# Patient Record
Sex: Male | Born: 1965
Health system: Southern US, Community
[De-identification: ages and names within clinical notes are randomized; demographics above are authoritative.]

## PROBLEM LIST (undated history)

## (undated) DIAGNOSIS — F4310 Post-traumatic stress disorder, unspecified: Secondary | ICD-10-CM

## (undated) DIAGNOSIS — F329 Major depressive disorder, single episode, unspecified: Secondary | ICD-10-CM

## (undated) DIAGNOSIS — K760 Fatty (change of) liver, not elsewhere classified: Secondary | ICD-10-CM

## (undated) DIAGNOSIS — G4733 Obstructive sleep apnea (adult) (pediatric): Secondary | ICD-10-CM

## (undated) DIAGNOSIS — F909 Attention-deficit hyperactivity disorder, unspecified type: Secondary | ICD-10-CM

## (undated) DIAGNOSIS — M1990 Unspecified osteoarthritis, unspecified site: Secondary | ICD-10-CM

## (undated) DIAGNOSIS — K219 Gastro-esophageal reflux disease without esophagitis: Secondary | ICD-10-CM

## (undated) DIAGNOSIS — Z860100 Personal history of colon polyps, unspecified: Secondary | ICD-10-CM

## (undated) DIAGNOSIS — T7840XA Allergy, unspecified, initial encounter: Secondary | ICD-10-CM

## (undated) DIAGNOSIS — F32A Depression, unspecified: Secondary | ICD-10-CM

## (undated) DIAGNOSIS — E785 Hyperlipidemia, unspecified: Secondary | ICD-10-CM

## (undated) DIAGNOSIS — I10 Essential (primary) hypertension: Secondary | ICD-10-CM

## (undated) DIAGNOSIS — A6000 Herpesviral infection of urogenital system, unspecified: Secondary | ICD-10-CM

## (undated) DIAGNOSIS — G4730 Sleep apnea, unspecified: Secondary | ICD-10-CM

## (undated) DIAGNOSIS — I861 Scrotal varices: Secondary | ICD-10-CM

## (undated) DIAGNOSIS — M722 Plantar fascial fibromatosis: Secondary | ICD-10-CM

## (undated) HISTORY — DX: Herpesviral infection of urogenital system, unspecified: A60.00

## (undated) HISTORY — DX: Fatty (change of) liver, not elsewhere classified: K76.0

## (undated) HISTORY — DX: Post-traumatic stress disorder, unspecified: F43.10

## (undated) HISTORY — DX: Attention-deficit hyperactivity disorder, unspecified type: F90.9

## (undated) HISTORY — DX: Allergy, unspecified, initial encounter: T78.40XA

## (undated) HISTORY — PX: PLANTAR FASCIA RELEASE: SHX2239

## (undated) HISTORY — PX: VARICOCELE EXCISION: SUR582

## (undated) HISTORY — DX: Unspecified osteoarthritis, unspecified site: M19.90

## (undated) HISTORY — DX: Major depressive disorder, single episode, unspecified: F32.9

## (undated) HISTORY — DX: Hyperlipidemia, unspecified: E78.5

## (undated) HISTORY — PX: FOOT SURGERY: SHX648

## (undated) HISTORY — DX: Sleep apnea, unspecified: G47.30

## (undated) HISTORY — DX: Gastro-esophageal reflux disease without esophagitis: K21.9

## (undated) HISTORY — DX: Obstructive sleep apnea (adult) (pediatric): G47.33

## (undated) HISTORY — DX: Scrotal varices: I86.1

## (undated) HISTORY — DX: Personal history of colonic polyps: Z86.010

## (undated) HISTORY — DX: Personal history of colon polyps, unspecified: Z86.0100

## (undated) HISTORY — DX: Essential (primary) hypertension: I10

## (undated) HISTORY — PX: ANKLE SURGERY: SHX546

## (undated) HISTORY — DX: Depression, unspecified: F32.A

## (undated) HISTORY — PX: SHOULDER SURGERY: SHX246

---

## 2008-01-28 ENCOUNTER — Emergency Department (HOSPITAL_BASED_OUTPATIENT_CLINIC_OR_DEPARTMENT_OTHER): Admission: EM | Admit: 2008-01-28 | Discharge: 2008-01-28 | Payer: Self-pay | Admitting: Emergency Medicine

## 2008-04-14 HISTORY — PX: NM MYOVIEW LTD: HXRAD82

## 2008-04-14 HISTORY — PX: DOPPLER ECHOCARDIOGRAPHY: SHX263

## 2008-04-20 ENCOUNTER — Emergency Department (HOSPITAL_BASED_OUTPATIENT_CLINIC_OR_DEPARTMENT_OTHER): Admission: EM | Admit: 2008-04-20 | Discharge: 2008-04-20 | Payer: Self-pay | Admitting: Emergency Medicine

## 2008-06-18 ENCOUNTER — Emergency Department (HOSPITAL_BASED_OUTPATIENT_CLINIC_OR_DEPARTMENT_OTHER): Admission: EM | Admit: 2008-06-18 | Discharge: 2008-06-18 | Payer: Self-pay | Admitting: Emergency Medicine

## 2008-06-18 ENCOUNTER — Emergency Department (HOSPITAL_BASED_OUTPATIENT_CLINIC_OR_DEPARTMENT_OTHER): Admission: EM | Admit: 2008-06-18 | Discharge: 2008-06-19 | Payer: Self-pay | Admitting: Emergency Medicine

## 2008-06-29 ENCOUNTER — Emergency Department (HOSPITAL_BASED_OUTPATIENT_CLINIC_OR_DEPARTMENT_OTHER): Admission: EM | Admit: 2008-06-29 | Discharge: 2008-06-29 | Payer: Self-pay | Admitting: Emergency Medicine

## 2008-08-04 HISTORY — PX: KNEE SURGERY: SHX244

## 2008-11-06 ENCOUNTER — Ambulatory Visit: Payer: Self-pay | Admitting: Radiology

## 2008-11-06 ENCOUNTER — Emergency Department (HOSPITAL_BASED_OUTPATIENT_CLINIC_OR_DEPARTMENT_OTHER): Admission: EM | Admit: 2008-11-06 | Discharge: 2008-11-06 | Payer: Self-pay | Admitting: Emergency Medicine

## 2009-01-21 ENCOUNTER — Emergency Department (HOSPITAL_BASED_OUTPATIENT_CLINIC_OR_DEPARTMENT_OTHER): Admission: EM | Admit: 2009-01-21 | Discharge: 2009-01-22 | Payer: Self-pay | Admitting: Emergency Medicine

## 2009-07-08 ENCOUNTER — Emergency Department (HOSPITAL_BASED_OUTPATIENT_CLINIC_OR_DEPARTMENT_OTHER): Admission: EM | Admit: 2009-07-08 | Discharge: 2009-07-08 | Payer: Self-pay | Admitting: Emergency Medicine

## 2009-07-31 ENCOUNTER — Emergency Department (HOSPITAL_BASED_OUTPATIENT_CLINIC_OR_DEPARTMENT_OTHER): Admission: EM | Admit: 2009-07-31 | Discharge: 2009-07-31 | Payer: Self-pay | Admitting: Emergency Medicine

## 2009-09-02 ENCOUNTER — Emergency Department (HOSPITAL_BASED_OUTPATIENT_CLINIC_OR_DEPARTMENT_OTHER): Admission: EM | Admit: 2009-09-02 | Discharge: 2009-09-02 | Payer: Self-pay | Admitting: Emergency Medicine

## 2009-10-15 ENCOUNTER — Emergency Department (HOSPITAL_BASED_OUTPATIENT_CLINIC_OR_DEPARTMENT_OTHER): Admission: EM | Admit: 2009-10-15 | Discharge: 2009-10-15 | Payer: Self-pay | Admitting: Emergency Medicine

## 2009-10-17 ENCOUNTER — Emergency Department (HOSPITAL_BASED_OUTPATIENT_CLINIC_OR_DEPARTMENT_OTHER): Admission: EM | Admit: 2009-10-17 | Discharge: 2009-10-17 | Payer: Self-pay | Admitting: Emergency Medicine

## 2010-02-26 ENCOUNTER — Emergency Department (HOSPITAL_BASED_OUTPATIENT_CLINIC_OR_DEPARTMENT_OTHER): Admission: EM | Admit: 2010-02-26 | Discharge: 2010-02-26 | Payer: Self-pay | Admitting: Emergency Medicine

## 2010-04-08 ENCOUNTER — Emergency Department (HOSPITAL_BASED_OUTPATIENT_CLINIC_OR_DEPARTMENT_OTHER)
Admission: EM | Admit: 2010-04-08 | Discharge: 2010-04-08 | Payer: Self-pay | Source: Home / Self Care | Admitting: Emergency Medicine

## 2010-06-24 LAB — GLUCOSE, CAPILLARY

## 2010-06-25 LAB — GLUCOSE, CAPILLARY: Glucose-Capillary: 122 mg/dL — ABNORMAL HIGH (ref 70–99)

## 2010-06-25 LAB — BASIC METABOLIC PANEL
Creatinine, Ser: 0.9 mg/dL (ref 0.4–1.5)
GFR calc Af Amer: >60 mL/min (ref >60)
Potassium: 3.7 mEq/L (ref 3.5–5.1)
Sodium: 144 mEq/L (ref 135–145)

## 2010-06-30 LAB — GLUCOSE, CAPILLARY: Glucose-Capillary: 127 mg/dL — ABNORMAL HIGH (ref 70–99)

## 2010-07-18 LAB — URINALYSIS, ROUTINE W REFLEX MICROSCOPIC
Glucose, UA: NEGATIVE mg/dL
Hgb urine dipstick: NEGATIVE
Protein, ur: NEGATIVE mg/dL
Specific Gravity, Urine: 1.005 (ref 1.005–1.030)
Urobilinogen, UA: 0.2 mg/dL (ref 0.0–1.0)
pH: 7.5 (ref 5.0–8.0)

## 2010-07-18 LAB — COMPREHENSIVE METABOLIC PANEL
Albumin: 4.8 g/dL (ref 3.5–5.2)
Calcium: 9.6 mg/dL (ref 8.4–10.5)
Chloride: 102 mEq/L (ref 96–112)
Creatinine, Ser: 1.1 mg/dL (ref 0.4–1.5)
GFR calc non Af Amer: >60 mL/min (ref >60)
Glucose, Bld: 133 mg/dL — ABNORMAL HIGH (ref 70–99)
Potassium: 3.6 mEq/L (ref 3.5–5.1)
Sodium: 142 mEq/L (ref 135–145)
Total Protein: 8.2 g/dL (ref 6.0–8.3)

## 2010-07-18 LAB — POCT CARDIAC MARKERS
Myoglobin, poc: 104 ng/mL (ref 12–200)
Troponin i, poc: <0.05 ng/mL (ref 0.00–0.09)

## 2010-07-18 LAB — CBC: MCV: 89.3 fL (ref 78.0–100.0)

## 2010-07-18 LAB — DIFFERENTIAL
Basophils Absolute: 0.2 10*3/uL — ABNORMAL HIGH (ref 0.0–0.1)
Basophils Relative: 3 % — ABNORMAL HIGH (ref 0–1)
Monocytes Absolute: 0.5 10*3/uL (ref 0.1–1.0)

## 2010-07-18 LAB — GLUCOSE, CAPILLARY: Glucose-Capillary: 141 mg/dL — ABNORMAL HIGH (ref 70–99)

## 2010-07-25 LAB — CBC
HCT: 48.3 % (ref 39.0–52.0)
MCV: 89.1 fL (ref 78.0–100.0)
RDW: 12.5 % (ref 11.5–15.5)

## 2010-07-25 LAB — DIFFERENTIAL
Basophils Absolute: 0.1 10*3/uL (ref 0.0–0.1)
Basophils Relative: 2 % — ABNORMAL HIGH (ref 0–1)
Eosinophils Absolute: 0 10*3/uL (ref 0.0–0.7)
Eosinophils Relative: 0 % (ref 0–5)
Lymphs Abs: 0.7 10*3/uL (ref 0.7–4.0)
Monocytes Absolute: 0.4 10*3/uL (ref 0.1–1.0)
Neutro Abs: 5.8 10*3/uL (ref 1.7–7.7)

## 2010-07-25 LAB — BASIC METABOLIC PANEL
Chloride: 104 mEq/L (ref 96–112)
Creatinine, Ser: 1 mg/dL (ref 0.4–1.5)
GFR calc non Af Amer: >60 mL/min (ref >60)
Glucose, Bld: 105 mg/dL — ABNORMAL HIGH (ref 70–99)
Potassium: 4.1 mEq/L (ref 3.5–5.1)
Sodium: 143 mEq/L (ref 135–145)

## 2010-07-29 LAB — GLUCOSE, CAPILLARY: Glucose-Capillary: 131 mg/dL — ABNORMAL HIGH (ref 70–99)

## 2010-08-29 ENCOUNTER — Emergency Department (HOSPITAL_BASED_OUTPATIENT_CLINIC_OR_DEPARTMENT_OTHER)
Admission: EM | Admit: 2010-08-29 | Discharge: 2010-08-29 | Disposition: A | Discharge status: Home / Self Care | Payer: Self-pay | Attending: Emergency Medicine | Admitting: Emergency Medicine

## 2010-08-29 DIAGNOSIS — G8929 Other chronic pain: Secondary | ICD-10-CM | POA: Insufficient documentation

## 2010-08-29 DIAGNOSIS — M545 Low back pain, unspecified: Secondary | ICD-10-CM | POA: Insufficient documentation

## 2010-08-29 DIAGNOSIS — E119 Type 2 diabetes mellitus without complications: Secondary | ICD-10-CM | POA: Insufficient documentation

## 2010-08-29 DIAGNOSIS — Z79899 Other long term (current) drug therapy: Secondary | ICD-10-CM | POA: Insufficient documentation

## 2010-08-29 DIAGNOSIS — G4730 Sleep apnea, unspecified: Secondary | ICD-10-CM | POA: Insufficient documentation

## 2010-08-29 DIAGNOSIS — E785 Hyperlipidemia, unspecified: Secondary | ICD-10-CM | POA: Insufficient documentation

## 2010-08-29 DIAGNOSIS — I10 Essential (primary) hypertension: Secondary | ICD-10-CM | POA: Insufficient documentation

## 2010-08-29 DIAGNOSIS — M5430 Sciatica, unspecified side: Secondary | ICD-10-CM | POA: Insufficient documentation

## 2011-01-17 ENCOUNTER — Encounter: Payer: Self-pay | Admitting: Family Medicine

## 2011-01-17 ENCOUNTER — Ambulatory Visit (HOSPITAL_BASED_OUTPATIENT_CLINIC_OR_DEPARTMENT_OTHER)
Admission: RE | Admit: 2011-01-17 | Discharge: 2011-01-17 | Disposition: A | Discharge status: Home / Self Care | Source: Ambulatory Visit | Attending: Family Medicine | Admitting: Family Medicine

## 2011-01-17 ENCOUNTER — Ambulatory Visit (INDEPENDENT_AMBULATORY_CARE_PROVIDER_SITE_OTHER): Admitting: Family Medicine

## 2011-01-17 VITALS — BP 166/92 | HR 89 | Temp 97.8°F | Ht 73.0 in | Wt 315.0 lb

## 2011-01-17 DIAGNOSIS — M25569 Pain in unspecified knee: Secondary | ICD-10-CM

## 2011-01-17 DIAGNOSIS — IMO0002 Reserved for concepts with insufficient information to code with codable children: Secondary | ICD-10-CM

## 2011-01-17 DIAGNOSIS — M25561 Pain in right knee: Secondary | ICD-10-CM

## 2011-01-17 DIAGNOSIS — M1710 Unilateral primary osteoarthritis, unspecified knee: Secondary | ICD-10-CM | POA: Insufficient documentation

## 2011-01-17 NOTE — Patient Instructions (Signed)
You flared up the arthritis in your right knee. Stop trying to kick punts. Take tylenol 500mg  1-2 tabs three times a day for pain. Aleve 1-2 tabs twice a day with food if you don't have stomach or kidney problems. Glucosamine sulfate 750mg  twice a day is a supplement that has been shown to help moderate to severe arthritis. Capsaicin topically up to four times a day may also help with pain. Cortisone injections are an option. If cortisone injections do not help, there are different types of shots that may help but they take longer to take effect. It's important that you continue to stay active. If you are overweight, try to lose weight through diet and exercise. Heat or ice 15 minutes at a time 3-4 times a day as needed to help with pain. Water aerobics and cycling with low resistance are the best two types of exercise for arthritis. A knee brace may be helpful if your knee feels unstable due to pain.

## 2011-01-17 NOTE — Progress Notes (Signed)
  Subjective:    Patient ID: Charles Nash, male    DOB: 06-24-1965, 45 y.o.   MRN: [id number]  PCP: Dr. Greggory Stallion  HPI 45 yo M here for right knee pain.  Patient with history of two knee arthroscopies for meniscal debridements. 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 Some swelling. Has grinding and catching anterior and medial knee. Has iced knee but not taking any pain medicine. Has h/o cortisone injections in past but none in past year or so.  Past Medical History  Diagnosis Date  . Hyperlipidemia   . Diabetes mellitus   . Hypertension   . OSA (obstructive sleep apnea)     No current outpatient prescriptions on file prior to visit.    Past Surgical History  Procedure Date  . Knee surgery   . Ankle surgery   . Foot surgery   . Shoulder surgery     Allergies  Allergen Reactions  . Tylox     History   Social History  . Marital Status: Married    Spouse Name: N/A    Number of Children: N/A  . Years of Education: N/A   Occupational History  . Not on file.   Social History Main Topics  . Smoking status: Never Smoker   . Smokeless tobacco: Not on file  . Alcohol Use: Not on file  . Drug Use: Not on file  . Sexually Active: Not on file   Other Topics Concern  . Not on file   Social History Narrative  . No narrative on file    Family History  Problem Relation Age of Onset  . Diabetes Mother   . Hypertension Mother   . Hypertension Father   . Hyperlipidemia Father   . Hyperlipidemia Maternal Grandmother   . Hypertension Maternal Grandmother   . Hyperlipidemia Maternal Grandfather   . Hypertension Maternal Grandfather   . Hyperlipidemia Paternal Grandmother   . Hypertension Paternal Grandmother   . Hyperlipidemia Paternal Grandfather   . Hypertension Paternal Grandfather   .  Heart attack Paternal Grandfather   . Sudden death Neg Hx     BP 166/92  Pulse 89  Temp(Src) 97.8 F (36.6 C) (Oral)  Ht 6\' 1"  (1.854 m)  Wt 315 lb (142.883 kg)  BMI 41.56 kg/m2  Review of Systems See HPI above.    Objective:   Physical Exam Gen: NAD  R knee: 1+ synovitis - healed arthroscopy scars anterior knee.  No other deformity, ecchymoses, swelling.  2+ Crepitation. Mod medial and posterior patellar facet TTP. ROM 0-110 degrees. Negative ant/post drawers. Negative valgus/varus testing. Negative lachmanns. Negative mcmurrays, apleys, patellar apprehension.  + clarkes. NV intact distally.    Assessment & Plan:  1. Right knee pain - 2/2 flare of DJD.  Discussed tylenol, icing, nsaids, glucosamine.  Went ahead with intraarticular cortisone injection today.  See instructions for further.  After informed written consent, patient was seated on exam table. Right knee was prepped with alcohol swab and utilizing anterolateral approach, patient's right knee was injected intraarticularly with 3:1 marcaine: depomedrol. Patient tolerated the procedure well without immediate complications.

## 2011-01-17 NOTE — Assessment & Plan Note (Signed)
2/2 flare of DJD.  Discussed tylenol, icing, nsaids, glucosamine.  Went ahead with intraarticular cortisone injection today.  See instructions for further.  After informed written consent, patient was seated on exam table. Right knee was prepped with alcohol swab and utilizing anterolateral approach, patient's right knee was injected intraarticularly with 3:1 marcaine: depomedrol. Patient tolerated the procedure well without immediate complications.

## 2011-01-31 ENCOUNTER — Ambulatory Visit (INDEPENDENT_AMBULATORY_CARE_PROVIDER_SITE_OTHER): Admitting: Family Medicine

## 2011-01-31 ENCOUNTER — Encounter: Payer: Self-pay | Admitting: Family Medicine

## 2011-01-31 VITALS — Ht 73.0 in | Wt 315.0 lb

## 2011-01-31 DIAGNOSIS — M25561 Pain in right knee: Secondary | ICD-10-CM

## 2011-01-31 DIAGNOSIS — M25569 Pain in unspecified knee: Secondary | ICD-10-CM

## 2011-01-31 NOTE — Assessment & Plan Note (Signed)
2/2 moderate DJD of right knee.  Start synvisc 3 shot series today.  Discussed tylenol, icing, nsaids, glucosamine prior visit.  Follow up in 1 week for second injection.  After informed written consent, patient was seated on exam table. Right knee was prepped with alcohol swab and utilizing anterolateral approach, patient's right knee was injected intraarticularly with 3 mL marcaine followed by 2mL synvisc. Patient tolerated the procedure well without immediate complications.

## 2011-01-31 NOTE — Progress Notes (Signed)
Subjective:    Patient ID: Charles Nash, male    DOB: 27-Jun-1965, 45 y.o.   MRN: [id number]  PCP: Dr. Greggory Stallion  Knee Pain    45 yo M here for f/u right knee pain, DJD.  10/5: Patient with history of two knee arthroscopies for meniscal debridements. 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 Some swelling. Has grinding and catching anterior and medial knee. Has iced knee but not taking any pain medicine. Has h/o cortisone injections in past but none in past year or so.  10/19: Patient did not have pain improvement with cortisone injection. Was examined in training room at school and he would like to move forward with synvisc injections for his DJD of right knee. No change in symptoms from last visit.  Past Medical History  Diagnosis Date  . Hyperlipidemia   . Diabetes mellitus   . Hypertension   . OSA (obstructive sleep apnea)     Current Outpatient Prescriptions on File Prior to Visit  Medication Sig Dispense Refill  . amLODipine (NORVASC) 10 MG tablet Take 10 mg by mouth daily.        Marland Kitchen aspirin 81 MG tablet Take 81 mg by mouth daily.        . hydrochlorothiazide (HYDRODIURIL) 25 MG tablet Take 25 mg by mouth daily.        Marland Kitchen lisinopril (PRINIVIL,ZESTRIL) 10 MG tablet Take 10 mg by mouth daily.        . metFORMIN (GLUCOPHAGE) 500 MG tablet Take 500 mg by mouth 2 (two) times daily with a meal.          Past Surgical History  Procedure Date  . Knee surgery   . Ankle surgery   . Foot surgery   . Shoulder surgery     Allergies  Allergen Reactions  . Tylox     History   Social History  . Marital Status: Married    Spouse Name: N/A    Number of Children: N/A  . Years of Education: N/A   Occupational History  . Not on file.   Social History Main Topics  . Smoking status: Never Smoker   .  Smokeless tobacco: Not on file  . Alcohol Use: Not on file  . Drug Use: Not on file  . Sexually Active: Not on file   Other Topics Concern  . Not on file   Social History Narrative  . No narrative on file    Family History  Problem Relation Age of Onset  . Diabetes Mother   . Hypertension Mother   . Hypertension Father   . Hyperlipidemia Father   . Hyperlipidemia Maternal Grandmother   . Hypertension Maternal Grandmother   . Hyperlipidemia Maternal Grandfather   . Hypertension Maternal Grandfather   . Hyperlipidemia Paternal Grandmother   . Hypertension Paternal Grandmother   . Hyperlipidemia Paternal Grandfather   . Hypertension Paternal Grandfather   . Heart attack Paternal Grandfather   . Sudden death Neg Hx     Ht 6\' 1"  (1.854 m)  Wt 315 lb (142.883 kg)  BMI 41.56 kg/m2  Review of Systems  See HPI above.    Objective:   Physical Exam  Gen: NAD  R knee: 1+ synovitis - healed arthroscopy scars anterior knee.  No other deformity, ecchymoses, swelling.  2+ crepitation. Mod medial TTP. ROM 0-110 degrees. Negative ant/post drawers. Negative valgus/varus testing. Negative lachmanns. Negative mcmurrays, apleys. NV intact distally.    Assessment & Plan:  1. Right knee pain - 2/2 moderate DJD of right knee.  Start synvisc 3 shot series today.  Discussed tylenol, icing, nsaids, glucosamine prior visit.  Follow up in 1 week for second injection.  After informed written consent, patient was seated on exam table. Right knee was prepped with alcohol swab and utilizing anterolateral approach, patient's right knee was injected intraarticularly with 3 mL marcaine followed by 2mL synvisc. Patient tolerated the procedure well without immediate complications.

## 2011-02-07 ENCOUNTER — Ambulatory Visit (INDEPENDENT_AMBULATORY_CARE_PROVIDER_SITE_OTHER): Admitting: Family Medicine

## 2011-02-07 ENCOUNTER — Encounter: Payer: Self-pay | Admitting: Family Medicine

## 2011-02-07 VITALS — BP 144/87 | HR 94 | Temp 98.2°F | Ht 73.0 in | Wt 325.0 lb

## 2011-02-07 DIAGNOSIS — M25561 Pain in right knee: Secondary | ICD-10-CM

## 2011-02-07 DIAGNOSIS — M25569 Pain in unspecified knee: Secondary | ICD-10-CM

## 2011-02-07 NOTE — Progress Notes (Signed)
Subjective:    Patient ID: Charles Nash, male    DOB: 13-Dec-1965, 45 y.o.   MRN: [id number]  PCP: Dr. Greggory Stallion  Knee Pain    45 yo M here for f/u right knee pain, DJD.  10/5: Patient with history of two knee arthroscopies for meniscal debridements. 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 Some swelling. Has grinding and catching anterior and medial knee. Has iced knee but not taking any pain medicine. Has h/o cortisone injections in past but none in past year or so.  10/19: Patient did not have pain improvement with cortisone injection. Was examined in training room at school and he would like to move forward with synvisc injections for his DJD of right knee. No change in symptoms from last visit.  10/26: Patient did well with first synvisc injection -- here today for second one. Had good pain relief even after first injection though some soreness now.  Past Medical History  Diagnosis Date  . Hyperlipidemia   . Diabetes mellitus   . Hypertension   . OSA (obstructive sleep apnea)     Current Outpatient Prescriptions on File Prior to Visit  Medication Sig Dispense Refill  . amLODipine (NORVASC) 10 MG tablet Take 10 mg by mouth daily.        Marland Kitchen aspirin 81 MG tablet Take 81 mg by mouth daily.        . hydrochlorothiazide (HYDRODIURIL) 25 MG tablet Take 25 mg by mouth daily.        Marland Kitchen lisinopril (PRINIVIL,ZESTRIL) 10 MG tablet Take 10 mg by mouth daily.        . metFORMIN (GLUCOPHAGE) 500 MG tablet Take 500 mg by mouth 2 (two) times daily with a meal.          Past Surgical History  Procedure Date  . Knee surgery   . Ankle surgery   . Foot surgery   . Shoulder surgery     Allergies  Allergen Reactions  . Tylox     History   Social History  . Marital Status: Married    Spouse Name: N/A   Number of Children: N/A  . Years of Education: N/A   Occupational History  . Not on file.   Social History Main Topics  . Smoking status: Never Smoker   . Smokeless tobacco: Not on file  . Alcohol Use: Not on file  . Drug Use: Not on file  . Sexually Active: Not on file   Other Topics Concern  . Not on file   Social History Narrative  . No narrative on file    Family History  Problem Relation Age of Onset  . Diabetes Mother   . Hypertension Mother   . Hypertension Father   . Hyperlipidemia Father   . Hyperlipidemia Maternal Grandmother   . Hypertension Maternal Grandmother   . Hyperlipidemia Maternal Grandfather   . Hypertension Maternal Grandfather   . Hyperlipidemia Paternal Grandmother   . Hypertension Paternal Grandmother   . Hyperlipidemia Paternal Grandfather   . Hypertension Paternal Grandfather   . Heart attack Paternal Grandfather   . Sudden death Neg Hx     BP 144/87  Pulse 94  Temp(Src) 98.2 F (36.8 C) (Oral)  Ht 6\' 1"  (1.854 m)  Wt 325 lb (147.419 kg)  BMI 42.88  kg/m2  Review of Systems  See HPI above.    Objective:   Physical Exam  Gen: NAD  R knee exam unchanged: 1+ synovitis - healed arthroscopy scars anterior knee.  No other deformity, ecchymoses, swelling.  2+ crepitation. Mod medial TTP. ROM 0-110 degrees. Negative ant/post drawers. Negative valgus/varus testing. Negative lachmanns. Negative mcmurrays, apleys. NV intact distally.    Assessment & Plan:  1. Right knee pain - 2/2 moderate DJD of right knee.  Second synvisc injection given today - Follow up in 1 week for third and final injection.  After informed written consent, patient was seated on exam table. Right knee was prepped with alcohol swab and utilizing anterolateral approach, patient's right knee was injected intraarticularly with 3 mL marcaine followed by 2mL synvisc. Patient tolerated the procedure well without immediate complications.

## 2011-02-07 NOTE — Assessment & Plan Note (Signed)
2/2 moderate DJD of right knee.  Second synvisc injection given today - Follow up in 1 week for third and final injection.  After informed written consent, patient was seated on exam table. Right knee was prepped with alcohol swab and utilizing anterolateral approach, patient's right knee was injected intraarticularly with 3 mL marcaine followed by 2mL synvisc. Patient tolerated the procedure well without immediate complications.

## 2011-02-14 ENCOUNTER — Encounter: Payer: Self-pay | Admitting: Family Medicine

## 2011-02-14 ENCOUNTER — Ambulatory Visit (INDEPENDENT_AMBULATORY_CARE_PROVIDER_SITE_OTHER): Admitting: Family Medicine

## 2011-02-14 VITALS — BP 133/85 | HR 92 | Temp 97.6°F | Ht 73.0 in | Wt 323.0 lb

## 2011-02-14 DIAGNOSIS — M25561 Pain in right knee: Secondary | ICD-10-CM

## 2011-02-14 DIAGNOSIS — M25569 Pain in unspecified knee: Secondary | ICD-10-CM

## 2011-02-14 NOTE — Assessment & Plan Note (Signed)
2/2 moderate DJD of right knee.  Third synvisc injection given today - doing well with these so far.  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  After informed written consent, patient was seated on exam table. Right knee was prepped with alcohol swab and utilizing anterolateral approach, patient's right knee was injected intraarticularly with 3 mL marcaine followed by 2mL synvisc. Patient tolerated the procedure well without immediate complications.

## 2011-02-14 NOTE — Patient Instructions (Signed)
Patient will start PT for patellar tendinopathy, pes bursitis.  To call if any issues otherwise will see him at football games.

## 2011-02-14 NOTE — Progress Notes (Signed)
Subjective:    Patient ID: Charles Nash, male    DOB: 04-19-1965, 45 y.o.   MRN: [id number]  PCP: Dr. Greggory Stallion  Knee Pain    45 yo M here for f/u right knee pain, DJD.  10/5: Patient with history of two knee arthroscopies for meniscal debridements. 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 Some swelling. Has grinding and catching anterior and medial knee. Has iced knee but not taking any pain medicine. Has h/o cortisone injections in past but none in past year or so.  10/19: Patient did not have pain improvement with cortisone injection. Was examined in training room at school and he would like to move forward with synvisc injections for his DJD of right knee. No change in symptoms from last visit.  10/26: Patient did well with first synvisc injection -- here today for second one. Had good pain relief even after first injection though some soreness now.  11/2: Overall doing well - actually having superficial knee pain over patellar tendon and kneecap.  No joint line pain now. Here for third synvisc injection.  Past Medical History  Diagnosis Date  . Hyperlipidemia   . Diabetes mellitus   . Hypertension   . OSA (obstructive sleep apnea)     Current Outpatient Prescriptions on File Prior to Visit  Medication Sig Dispense Refill  . amLODipine (NORVASC) 10 MG tablet Take 10 mg by mouth daily.        Marland Kitchen aspirin 81 MG tablet Take 81 mg by mouth daily.        . hydrochlorothiazide (HYDRODIURIL) 25 MG tablet Take 25 mg by mouth daily.        Marland Kitchen lisinopril (PRINIVIL,ZESTRIL) 10 MG tablet Take 10 mg by mouth daily.        . metFORMIN (GLUCOPHAGE) 500 MG tablet Take 500 mg by mouth 2 (two) times daily with a meal.          Past Surgical History  Procedure Date  . Knee surgery   . Ankle surgery   . Foot  surgery   . Shoulder surgery     Allergies  Allergen Reactions  . Tylox     History   Social History  . Marital Status: Married    Spouse Name: N/A    Number of Children: N/A  . Years of Education: N/A   Occupational History  . Not on file.   Social History Main Topics  . Smoking status: Never Smoker   . Smokeless tobacco: Not on file  . Alcohol Use: Not on file  . Drug Use: Not on file  . Sexually Active: Not on file   Other Topics Concern  . Not on file   Social History Narrative  . No narrative on file    Family History  Problem Relation Age of Onset  . Diabetes Mother   . Hypertension Mother   . Hypertension Father   . Hyperlipidemia Father   . Hyperlipidemia Maternal Grandmother   . Hypertension Maternal Grandmother   . Hyperlipidemia Maternal Grandfather   . Hypertension Maternal Grandfather   . Hyperlipidemia Paternal Grandmother   . Hypertension Paternal Grandmother   . Hyperlipidemia Paternal Grandfather   . Hypertension Paternal Grandfather   . Heart attack Paternal Grandfather   . Sudden death Neg Hx  BP 133/85  Pulse 92  Temp(Src) 97.6 F (36.4 C) (Oral)  Ht 6\' 1"  (1.854 m)  Wt 323 lb (146.512 kg)  BMI 42.61 kg/m2  Review of Systems  See HPI above.    Objective:   Physical Exam  Gen: NAD  R knee exam unchanged: 1+ synovitis - healed arthroscopy scars anterior knee.  No other deformity, ecchymoses, swelling.  2+ crepitation. No joint line TTP.  Mild TTP patellar tendon and prepatellar bursa - mild swelling of bursa.  No erythema. ROM 0-110 degrees. Negative ant/post drawers. Negative valgus/varus testing. Negative lachmanns. Negative mcmurrays, apleys. NV intact distally.    Assessment & Plan:  1. Right knee pain - 2/2 moderate DJD of right knee.  Third synvisc injection given today - doing well with these so far.  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  After informed written consent, patient was seated on exam table. Right knee was prepped with alcohol swab and utilizing anterolateral approach, patient's right knee was injected intraarticularly with 3 mL marcaine followed by 2mL synvisc. Patient tolerated the procedure well without immediate complications.

## 2011-02-21 ENCOUNTER — Ambulatory Visit: Attending: Family Medicine | Admitting: Physical Therapy

## 2011-02-21 DIAGNOSIS — IMO0001 Reserved for inherently not codable concepts without codable children: Secondary | ICD-10-CM | POA: Insufficient documentation

## 2011-02-21 DIAGNOSIS — M25669 Stiffness of unspecified knee, not elsewhere classified: Secondary | ICD-10-CM | POA: Insufficient documentation

## 2011-02-21 DIAGNOSIS — M25569 Pain in unspecified knee: Secondary | ICD-10-CM | POA: Insufficient documentation

## 2011-03-03 ENCOUNTER — Ambulatory Visit: Admitting: Physical Therapy

## 2011-03-05 ENCOUNTER — Ambulatory Visit: Admitting: Physical Therapy

## 2011-03-11 ENCOUNTER — Ambulatory Visit: Admitting: Physical Therapy

## 2011-03-13 ENCOUNTER — Ambulatory Visit: Admitting: Physical Therapy

## 2011-03-18 ENCOUNTER — Ambulatory Visit: Attending: Family Medicine | Admitting: Physical Therapy

## 2011-03-18 DIAGNOSIS — M25669 Stiffness of unspecified knee, not elsewhere classified: Secondary | ICD-10-CM | POA: Insufficient documentation

## 2011-03-18 DIAGNOSIS — M25569 Pain in unspecified knee: Secondary | ICD-10-CM | POA: Insufficient documentation

## 2011-03-18 DIAGNOSIS — IMO0001 Reserved for inherently not codable concepts without codable children: Secondary | ICD-10-CM | POA: Insufficient documentation

## 2011-03-20 ENCOUNTER — Encounter: Admitting: Physical Therapy

## 2011-03-25 ENCOUNTER — Ambulatory Visit: Admitting: Physical Therapy

## 2011-03-27 ENCOUNTER — Ambulatory Visit: Admitting: Physical Therapy

## 2011-03-30 ENCOUNTER — Emergency Department (HOSPITAL_BASED_OUTPATIENT_CLINIC_OR_DEPARTMENT_OTHER)
Admission: EM | Admit: 2011-03-30 | Discharge: 2011-03-30 | Disposition: A | Discharge status: Home / Self Care | Attending: Emergency Medicine | Admitting: Emergency Medicine

## 2011-03-30 ENCOUNTER — Encounter (HOSPITAL_BASED_OUTPATIENT_CLINIC_OR_DEPARTMENT_OTHER): Payer: Self-pay | Admitting: *Deleted

## 2011-03-30 DIAGNOSIS — M79609 Pain in unspecified limb: Secondary | ICD-10-CM | POA: Insufficient documentation

## 2011-03-30 DIAGNOSIS — E785 Hyperlipidemia, unspecified: Secondary | ICD-10-CM | POA: Insufficient documentation

## 2011-03-30 DIAGNOSIS — E119 Type 2 diabetes mellitus without complications: Secondary | ICD-10-CM | POA: Insufficient documentation

## 2011-03-30 DIAGNOSIS — I10 Essential (primary) hypertension: Secondary | ICD-10-CM | POA: Insufficient documentation

## 2011-03-30 DIAGNOSIS — G4733 Obstructive sleep apnea (adult) (pediatric): Secondary | ICD-10-CM | POA: Insufficient documentation

## 2011-03-30 DIAGNOSIS — L723 Sebaceous cyst: Secondary | ICD-10-CM | POA: Insufficient documentation

## 2011-03-30 DIAGNOSIS — L729 Follicular cyst of the skin and subcutaneous tissue, unspecified: Secondary | ICD-10-CM

## 2011-03-30 DIAGNOSIS — Z79899 Other long term (current) drug therapy: Secondary | ICD-10-CM | POA: Insufficient documentation
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
## 2011-03-30 NOTE — ED Provider Notes (Signed)
Medical screening examination/treatment/procedure(s) were performed by non-physician practitioner and as supervising physician I was immediately available for consultation/collaboration.  Toy Baker, MD 03/30/11 2033
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
## 2011-04-01 ENCOUNTER — Ambulatory Visit: Admitting: Physical Therapy

## 2011-04-03 ENCOUNTER — Ambulatory Visit: Admitting: Physical Therapy

## 2011-04-18 ENCOUNTER — Ambulatory Visit: Attending: Family Medicine | Admitting: Physical Therapy

## 2011-04-18 DIAGNOSIS — M25569 Pain in unspecified knee: Secondary | ICD-10-CM | POA: Insufficient documentation

## 2011-04-18 DIAGNOSIS — M25669 Stiffness of unspecified knee, not elsewhere classified: Secondary | ICD-10-CM | POA: Insufficient documentation

## 2011-04-18 DIAGNOSIS — IMO0001 Reserved for inherently not codable concepts without codable children: Secondary | ICD-10-CM | POA: Insufficient documentation

## 2011-04-21 ENCOUNTER — Ambulatory Visit: Admitting: Physical Therapy

## 2011-04-25 ENCOUNTER — Ambulatory Visit: Admitting: Physical Therapy

## 2011-04-28 ENCOUNTER — Ambulatory Visit: Admitting: Physical Therapy

## 2011-04-30 ENCOUNTER — Ambulatory Visit: Admitting: Physical Therapy

## 2011-05-07 ENCOUNTER — Ambulatory Visit: Admitting: Physical Therapy

## 2011-05-09 ENCOUNTER — Ambulatory Visit: Admitting: Physical Therapy

## 2011-05-12 ENCOUNTER — Ambulatory Visit: Admitting: Physical Therapy

## 2011-05-18 ENCOUNTER — Encounter (HOSPITAL_BASED_OUTPATIENT_CLINIC_OR_DEPARTMENT_OTHER): Payer: Self-pay | Admitting: Emergency Medicine

## 2011-05-18 ENCOUNTER — Emergency Department (HOSPITAL_BASED_OUTPATIENT_CLINIC_OR_DEPARTMENT_OTHER)
Admission: EM | Admit: 2011-05-18 | Discharge: 2011-05-18 | Disposition: A | Discharge status: Home / Self Care | Attending: Emergency Medicine | Admitting: Emergency Medicine

## 2011-05-18 ENCOUNTER — Other Ambulatory Visit: Payer: Self-pay

## 2011-05-18 ENCOUNTER — Emergency Department (INDEPENDENT_AMBULATORY_CARE_PROVIDER_SITE_OTHER)

## 2011-05-18 DIAGNOSIS — E785 Hyperlipidemia, unspecified: Secondary | ICD-10-CM | POA: Insufficient documentation

## 2011-05-18 DIAGNOSIS — R42 Dizziness and giddiness: Secondary | ICD-10-CM | POA: Insufficient documentation

## 2011-05-18 DIAGNOSIS — G4733 Obstructive sleep apnea (adult) (pediatric): Secondary | ICD-10-CM | POA: Insufficient documentation

## 2011-05-18 DIAGNOSIS — Z79899 Other long term (current) drug therapy: Secondary | ICD-10-CM | POA: Insufficient documentation

## 2011-05-18 DIAGNOSIS — R55 Syncope and collapse: Secondary | ICD-10-CM

## 2011-05-18 DIAGNOSIS — E119 Type 2 diabetes mellitus without complications: Secondary | ICD-10-CM | POA: Insufficient documentation

## 2011-05-18 DIAGNOSIS — I10 Essential (primary) hypertension: Secondary | ICD-10-CM | POA: Insufficient documentation

## 2011-05-18 LAB — DIFFERENTIAL
Eosinophils Absolute: 0.1 10*3/uL (ref 0.0–0.7)
Eosinophils Relative: 1 % (ref 0–5)
Lymphs Abs: 1.5 10*3/uL (ref 0.7–4.0)
Monocytes Relative: 9 % (ref 3–12)

## 2011-05-18 LAB — BASIC METABOLIC PANEL
BUN: 9 mg/dL (ref 6–23)
Calcium: 9.6 mg/dL (ref 8.4–10.5)
GFR calc non Af Amer: >90 mL/min (ref >90)
Glucose, Bld: 89 mg/dL (ref 70–99)

## 2011-05-18 LAB — CBC
MCH: 29.7 pg (ref 26.0–34.0)
MCV: 86.5 fL (ref 78.0–100.0)
Platelets: 280 10*3/uL (ref 150–400)
RBC: 4.95 MIL/uL (ref 4.22–5.81)

## 2011-05-18 NOTE — ED Provider Notes (Signed)
History     CSN: [id number]  Arrival date & time 05/18/11  1211   First MD Initiated Contact with Patient 05/18/11 1242      Chief Complaint  Patient presents with  . 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 No language interpreter was used.    Past Medical History  Diagnosis Date  . Hyperlipidemia   . Diabetes mellitus   . Hypertension   . OSA (obstructive sleep apnea)     Past Surgical History  Procedure Date  . Knee surgery   . Ankle surgery   . Foot surgery   . Shoulder surgery     Family History  Problem Relation Age of Onset  . Diabetes Mother   . Hypertension Mother   . Hypertension Father   . Hyperlipidemia Father   . Hyperlipidemia Maternal Grandmother   . Hypertension Maternal Grandmother   . Hyperlipidemia Maternal Grandfather   . Hypertension Maternal Grandfather   . Hyperlipidemia Paternal Grandmother   . Hypertension Paternal Grandmother   . Hyperlipidemia Paternal Grandfather   . Hypertension Paternal Grandfather   . Heart attack Paternal Grandfather   . Sudden death Neg Hx     History  Substance Use Topics  . Smoking status: Never Smoker   . Smokeless tobacco: Not on file  . Alcohol Use: Yes       Review of Systems  Cardiovascular: Negative for palpitations.  All other systems reviewed and are negative.    Allergies  Tylox  Home Medications   Current Outpatient Rx  Name Route Sig Dispense Refill  . LOSARTAN POTASSIUM 100 MG PO TABS Oral Take 100 mg by mouth daily.    . MULTIVITAMINS PO CAPS Oral Take 1 capsule by mouth daily.    Marland Kitchen AMLODIPINE BESYLATE 10 MG PO TABS Oral Take 10 mg by mouth daily.      . ASPIRIN 81 MG PO TABS Oral Take 81 mg by mouth daily.      Marland Kitchen HYDROCHLOROTHIAZIDE 25 MG PO TABS Oral Take 25 mg by mouth daily.      Marland Kitchen LISINOPRIL 10 MG PO TABS Oral Take 10 mg by mouth daily.      Marland Kitchen METFORMIN HCL 500 MG PO TABS Oral Take 500 mg by mouth 2 (two) times daily with a meal.     . OMEPRAZOLE 20 MG PO CPDR Oral Take 40 mg by mouth every morning.      Marland Kitchen PRESCRIPTION MEDICATION Both Eyes Place 1 drop into both eyes 2 (two) times daily. Steroid eye drop    . PRESCRIPTION MEDICATION Topical Apply 1 patch topically 2 (two) times a week. Pain patch on right knee on Tuesdays and Thursdays    . SIMVASTATIN PO Oral Take 0.5  tablets by mouth at bedtime.        BP 162/100  Pulse 107  Temp(Src) 98.1 F (36.7 C) (Oral)  Resp 20  SpO2 99%  Physical Exam  Nursing note and vitals reviewed. Constitutional: He is oriented to person, place, and time. He appears well-developed and well-nourished.  HENT:  Head: Normocephalic.  Right Ear: External ear normal.  Left Ear: External ear normal.  Nose: Rhinorrhea present.  Eyes: EOM are normal.  Neck: Neck supple.  Cardiovascular: Normal rate and regular rhythm.   Pulmonary/Chest: Effort normal and breath sounds normal.  Abdominal: Soft. Bowel sounds are normal.  Musculoskeletal: Normal range of motion.  Neurological: He is alert and oriented to person, place, and time.  Skin: Skin is warm and dry.  Psychiatric: He has a normal mood and affect.    ED Course  Procedures (including critical care time)  Labs Reviewed   BASIC METABOLIC PANEL - Abnormal; Notable for the following:    Potassium 3.4 (*)    All other components within normal limits  CBC  DIFFERENTIAL   Dg Chest 2 View  05/18/2011  *RADIOLOGY REPORT*  Clinical Data: Dizziness.  Presyncope.  CHEST - 2 VIEW  Comparison:  04/08/2010  Findings:  The heart size and mediastinal contours are within normal limits.  Both lungs are clear.  The visualized skeletal structures are unremarkable.  IMPRESSION: No active cardiopulmonary disease.  Original Report Authenticated By: Danae Orleans, M.D.     1. Dizziness, nonspecific      Date: 05/18/2011  Rate: 105  Rhythm: sinus tachycardia  QRS Axis: right  Intervals: normal  ST/T Wave abnormalities: normal  Conduction Disutrbances:none  Narrative Interpretation:   Old EKG Reviewed: unchanged    MDM  Pt is ambulatory without any problems pt asymptomatic at this time        Teressa Lower, NP 05/18/11 1828

## 2011-05-18 NOTE — ED Notes (Signed)
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  Denies pain or near syncope.

## 2011-05-19 ENCOUNTER — Ambulatory Visit (INDEPENDENT_AMBULATORY_CARE_PROVIDER_SITE_OTHER): Admitting: Family

## 2011-05-19 ENCOUNTER — Encounter: Payer: Self-pay | Admitting: Family

## 2011-05-19 ENCOUNTER — Telehealth: Payer: Self-pay | Admitting: *Deleted

## 2011-05-19 DIAGNOSIS — G4733 Obstructive sleep apnea (adult) (pediatric): Secondary | ICD-10-CM

## 2011-05-19 DIAGNOSIS — J309 Allergic rhinitis, unspecified: Secondary | ICD-10-CM

## 2011-05-19 DIAGNOSIS — E785 Hyperlipidemia, unspecified: Secondary | ICD-10-CM

## 2011-05-19 DIAGNOSIS — J3089 Other allergic rhinitis: Secondary | ICD-10-CM | POA: Insufficient documentation

## 2011-05-19 DIAGNOSIS — I10 Essential (primary) hypertension: Secondary | ICD-10-CM

## 2011-05-19 DIAGNOSIS — E119 Type 2 diabetes mellitus without complications: Secondary | ICD-10-CM

## 2011-05-19 DIAGNOSIS — E669 Obesity, unspecified: Secondary | ICD-10-CM | POA: Insufficient documentation

## 2011-05-19 LAB — HEPATIC FUNCTION PANEL
Bilirubin, Direct: 0.1 mg/dL (ref 0.0–0.3)
Indirect Bilirubin: 0.6 mg/dL (ref 0.0–0.9)

## 2011-05-19 MED ORDER — FLUTICASONE PROPIONATE 50 MCG/ACT NA SUSP: 2.0000 | Freq: Every day | NASAL | Status: DC

## 2011-05-19 MED ORDER — METOPROLOL SUCCINATE ER 25 MG PO TB24: 25.0000 mg | ORAL_TABLET | Freq: Every day | ORAL | Status: DC

## 2011-05-19 MED ORDER — LOSARTAN POTASSIUM 100 MG PO TABS: 100.0000 mg | ORAL_TABLET | Freq: Every day | ORAL | Status: DC

## 2011-05-19 MED ORDER — POTASSIUM CHLORIDE ER 10 MEQ PO TBCR: 10.0000 meq | EXTENDED_RELEASE_TABLET | Freq: Every day | ORAL | Status: DC

## 2011-05-19 NOTE — Patient Instructions (Addendum)
Complete your labs prior to leaving today. Your be contacted about your referral to the nutritionist. Please call us and let us know if you are taking cozaar and the dose of your simvastatin. Follow up in 2 months for a complete physical. Come fasting to this appointment.

## 2011-05-19 NOTE — Assessment & Plan Note (Signed)
Obtain A1C, bmet, urine microalbumin. Continue glucophage.

## 2011-05-19 NOTE — ED Provider Notes (Signed)
Medical screening examination/treatment/procedure(s) were performed by non-physician practitioner and as supervising physician I was immediately available for consultation/collaboration.   Celene Kras, MD 05/19/11 [phone]

## 2011-05-19 NOTE — Progress Notes (Signed)
Subjective:    Patient ID: Charles Nash, male    DOB: November 24, 1965, 46 y.o.   MRN: [id number]  HPI  Pt was seen in ED yesterday due to light headedness.  Told that his blood pressure was 165/100.  Amlodipine 10mg , hctz, lisinopril, losartan.  He is followed by the [place]. He reports that he was diagnosed in 2003.   1)Arthritis- reports + hx of arthritis.  2) DM2- He reports that he checked his sugar yesterday- was 95 yesterday.  Reports that sometimes he has sugars 150-190 fasting.    3) Eating disorder- Reports that he overeats.  Gave up fried foods in January.  Weight has stabilized since that time.   4) GERD-Reports that this is well controlled with use of OTC prilosec  5) Hay fever- seasonal and usually well controlled with   6) Hyperlipidemia- he takes simvastatin- though unsure of dosage.    7) hx of colon polyps.    8) OSA- has had a CPAP since 2008.  He reports that he has had some congestion and has been unable to wear CPAP.  He has been using mucinex without improvement.  He reports + fatigue.  9) PTSD- Reports that this is related to his military experiences and that he has partial disability due to this.  Review of Systems  Constitutional: Negative for unexpected weight change.  HENT: Positive for congestion.   Eyes: Negative for visual disturbance.  Respiratory: Negative for shortness of breath.   Cardiovascular: Negative for chest pain.  Gastrointestinal: Negative for nausea and vomiting.       Chronic diarrhea  Genitourinary: Positive for urgency. Negative for dysuria.       Some dribbling urgency- happens about twice a month- see Dr. Brunilda Payor and was placed on testosterone medication.   Musculoskeletal: Negative for myalgias and arthralgias.       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 Has apt 3/5 at [facility] with Neurosurgery.   Skin: Negative for rash.  Neurological:       Mild headaches  Hematological: Negative for adenopathy.    Psychiatric/Behavioral:       Some anxiety.   Past Medical History  Diagnosis Date  . Hyperlipidemia   . Diabetes mellitus   . Hypertension   . OSA (obstructive sleep apnea)   . Arthritis   . Allergy   . Personal history of colonic polyps   . GERD (gastroesophageal reflux disease)     History   Social History  . Marital Status: Married    Spouse Name: N/A    Number of Children: 2  . Years of Education: N/A   Occupational History  . FOOTBALL COACH    Social History Main Topics  . Smoking status: Never Smoker   . Smokeless tobacco: Never Used  . Alcohol Use: Yes     occasional wine  . Drug Use: No  . Sexually Active: Not on file   Other Topics Concern  . Not on file   Social History Narrative   Regular exercise:  NoCaffeine use:  2--32oz cups daily    Past Surgical History  Procedure Date  . Knee surgery   . Ankle surgery   . Foot surgery   . Shoulder surgery     Family History  Problem Relation Age of Onset  . Diabetes Mother   . Hypertension Mother   . Arthritis Mother   . Cancer Mother     uterine and breast cancer  . Hyperlipidemia Father   .  Arthritis Father   . Cancer Father     prostate cancer  . Hyperlipidemia Maternal Grandmother   . Hypertension Maternal Grandmother   . Hyperlipidemia Maternal Grandfather   . Hypertension Maternal Grandfather   . Hyperlipidemia Paternal Grandmother   . Hypertension Paternal Grandmother   . Hyperlipidemia Paternal Grandfather   . Hypertension Paternal Grandfather   . Heart attack Paternal Grandfather   . Sudden death Neg Hx     Allergies  Allergen Reactions  . Tylox Hives and Itching    Current Outpatient Prescriptions on File Prior to Visit  Medication Sig Dispense Refill  . amLODipine (NORVASC) 10 MG tablet Take 10 mg by mouth daily.        Marland Kitchen aspirin 81 MG tablet Take 81 mg by mouth daily.        . hydrochlorothiazide (HYDRODIURIL) 25 MG tablet Take 25 mg by mouth daily.        Marland Kitchen lisinopril  (PRINIVIL,ZESTRIL) 10 MG tablet Take 10 mg by mouth daily.        Marland Kitchen losartan (COZAAR) 100 MG tablet Take 100 mg by mouth daily.      . metFORMIN (GLUCOPHAGE) 500 MG tablet Take 500 mg by mouth 2 (two) times daily with a meal.       . Multiple Vitamin (MULTIVITAMIN) capsule Take 1 capsule by mouth daily.      Marland Kitchen omeprazole (PRILOSEC) 20 MG capsule Take 40 mg by mouth every morning.        Marland Kitchen PRESCRIPTION MEDICATION Place 1 drop into both eyes 2 (two) times daily. Steroid eye drop      . PRESCRIPTION MEDICATION Apply 1 patch topically 2 (two) times a week. Pain patch on right knee on Tuesdays and Thursdays      . SIMVASTATIN PO Take 0.5 tablets by mouth at bedtime.          BP 146/92  Pulse 107  Temp(Src) 97.8 F (36.6 C) (Oral)  Resp 16  Ht 6\' 1"  (1.854 m)  Wt 331 lb 1.9 oz (150.195 kg)  BMI 43.69 kg/m2  SpO2 97%        Objective:   Physical Exam  Constitutional: He appears well-developed and well-nourished. No distress.  HENT:  Head: Normocephalic and atraumatic.  Right Ear: Tympanic membrane and ear canal normal.  Left Ear: Tympanic membrane and ear canal normal.       No sinus tenderness to palpation.  Eyes: Conjunctivae are normal.  Neck: Normal range of motion. Neck supple.  Cardiovascular: Normal rate and regular rhythm.   No murmur heard. Pulmonary/Chest: Effort normal and breath sounds normal. No respiratory distress. He has no wheezes. He has no rales. He exhibits no tenderness.  Musculoskeletal: He exhibits no edema.  Skin: Skin is warm and dry.  Psychiatric: He has a normal mood and affect. His behavior is normal. Judgment and thought content normal.          Assessment & Plan:

## 2011-05-19 NOTE — Assessment & Plan Note (Signed)
Will refer to nutrition for assistance with diabetic diet and weight loss.

## 2011-05-19 NOTE — Assessment & Plan Note (Signed)
Start flonase, resume zyrtec.

## 2011-05-19 NOTE — Telephone Encounter (Signed)
Refill sent to pharmacy.  I would also like for him to start toprol xl 25mg  once daily for blood pressure.  When you speak to him, please verify testosterone dosing and simvastatin dose.

## 2011-05-19 NOTE — Assessment & Plan Note (Signed)
Stressed importance of compliance with CPAP.

## 2011-05-19 NOTE — Assessment & Plan Note (Addendum)
Deteriorated.  Add Beta Blocker.

## 2011-05-19 NOTE — Telephone Encounter (Signed)
Pt called stating he is not taking Lisinopril. Takes Losartan 100mg  daily and would like refill sent to [facility] on [address]. Please advise.

## 2011-05-19 NOTE — Assessment & Plan Note (Signed)
On statin. Obtain lft's plan to check FLP next visit when fasting.

## 2011-05-19 NOTE — Telephone Encounter (Signed)
Attempted to reach pt and received message that voice mail box was full. Will try again tomorrow.

## 2011-05-20 ENCOUNTER — Telehealth: Payer: Self-pay | Admitting: *Deleted

## 2011-05-20 ENCOUNTER — Encounter: Payer: Self-pay | Admitting: Family

## 2011-05-20 ENCOUNTER — Emergency Department (HOSPITAL_BASED_OUTPATIENT_CLINIC_OR_DEPARTMENT_OTHER)
Admission: EM | Admit: 2011-05-20 | Discharge: 2011-05-20 | Disposition: A | Discharge status: Home / Self Care | Attending: Emergency Medicine | Admitting: Emergency Medicine

## 2011-05-20 ENCOUNTER — Encounter (HOSPITAL_BASED_OUTPATIENT_CLINIC_OR_DEPARTMENT_OTHER): Payer: Self-pay | Admitting: *Deleted

## 2011-05-20 ENCOUNTER — Emergency Department (INDEPENDENT_AMBULATORY_CARE_PROVIDER_SITE_OTHER)

## 2011-05-20 ENCOUNTER — Other Ambulatory Visit: Payer: Self-pay

## 2011-05-20 DIAGNOSIS — E119 Type 2 diabetes mellitus without complications: Secondary | ICD-10-CM | POA: Insufficient documentation

## 2011-05-20 DIAGNOSIS — R51 Headache: Secondary | ICD-10-CM

## 2011-05-20 DIAGNOSIS — R42 Dizziness and giddiness: Secondary | ICD-10-CM | POA: Insufficient documentation

## 2011-05-20 DIAGNOSIS — Z79899 Other long term (current) drug therapy: Secondary | ICD-10-CM | POA: Insufficient documentation

## 2011-05-20 DIAGNOSIS — R945 Abnormal results of liver function studies: Secondary | ICD-10-CM

## 2011-05-20 DIAGNOSIS — J329 Chronic sinusitis, unspecified: Secondary | ICD-10-CM

## 2011-05-20 DIAGNOSIS — K219 Gastro-esophageal reflux disease without esophagitis: Secondary | ICD-10-CM | POA: Insufficient documentation

## 2011-05-20 DIAGNOSIS — R030 Elevated blood-pressure reading, without diagnosis of hypertension: Secondary | ICD-10-CM

## 2011-05-20 DIAGNOSIS — R110 Nausea: Secondary | ICD-10-CM | POA: Insufficient documentation

## 2011-05-20 DIAGNOSIS — E785 Hyperlipidemia, unspecified: Secondary | ICD-10-CM | POA: Insufficient documentation

## 2011-05-20 DIAGNOSIS — G4733 Obstructive sleep apnea (adult) (pediatric): Secondary | ICD-10-CM | POA: Insufficient documentation

## 2011-05-20 DIAGNOSIS — R29898 Other symptoms and signs involving the musculoskeletal system: Secondary | ICD-10-CM

## 2011-05-20 DIAGNOSIS — I10 Essential (primary) hypertension: Secondary | ICD-10-CM | POA: Insufficient documentation

## 2011-05-20 LAB — CBC
Hemoglobin: 14.8 g/dL (ref 13.0–17.0)
MCH: 29.6 pg (ref 26.0–34.0)
MCV: 85.2 fL (ref 78.0–100.0)
RBC: 5 MIL/uL (ref 4.22–5.81)

## 2011-05-20 LAB — URINALYSIS, ROUTINE W REFLEX MICROSCOPIC
Ketones, ur: NEGATIVE mg/dL
Leukocytes, UA: NEGATIVE
Nitrite: NEGATIVE
Protein, ur: NEGATIVE mg/dL
Urobilinogen, UA: 0.2 mg/dL (ref 0.0–1.0)
pH: 7 (ref 5.0–8.0)

## 2011-05-20 LAB — COMPREHENSIVE METABOLIC PANEL
Alkaline Phosphatase: 76 U/L (ref 39–117)
BUN: 11 mg/dL (ref 6–23)
CO2: 28 mEq/L (ref 19–32)
GFR calc Af Amer: >90 mL/min (ref >90)
GFR calc non Af Amer: 90 mL/min — ABNORMAL LOW (ref >90)
Glucose, Bld: 97 mg/dL (ref 70–99)
Potassium: 3.4 mEq/L — ABNORMAL LOW (ref 3.5–5.1)
Total Protein: 7.9 g/dL (ref 6.0–8.3)

## 2011-05-20 LAB — MICROALBUMIN / CREATININE URINE RATIO
Creatinine, Urine: 98.9 mg/dL
Microalb Creat Ratio: 11.2 mg/g (ref 0.0–30.0)

## 2011-05-20 LAB — DIFFERENTIAL
Eosinophils Absolute: 0 10*3/uL (ref 0.0–0.7)
Eosinophils Relative: 1 % (ref 0–5)
Lymphs Abs: 1.7 10*3/uL (ref 0.7–4.0)
Monocytes Relative: 9 % (ref 3–12)
Neutrophils Relative %: 62 % (ref 43–77)

## 2011-05-20 MED ORDER — AMOXICILLIN-POT CLAVULANATE 875-125 MG PO TABS: 1.0000 | ORAL_TABLET | Freq: Two times a day (BID) | ORAL | Status: DC

## 2011-05-20 NOTE — ED Provider Notes (Signed)
Medical screening examination/treatment/procedure(s) were performed by non-physician practitioner and as supervising physician I was immediately available for consultation/collaboration.   Andrew King, MD 05/20/11 2141 

## 2011-05-20 NOTE — Telephone Encounter (Signed)
Received call from pt stating he just had an episode at 4pm today of feeling off balance and felt hot. States this has never occurred before. Checked BP at home and was 155/99 and 157/93 Pulse 106--113. BS is 108. Pt denies chest pain, headache, n/v, facial drooping, speech changes, extremity weakness and numbness.  Pt states he is feeling some better now that he is sitting down. Advised pt per Sandford Craze, NP to follow up with her in the morning and to be evaluated in the ER if he develops any of the above symptoms or if current symptoms worsen. Appt schedulede for 9:15. Pt voices understanding.

## 2011-05-20 NOTE — Telephone Encounter (Signed)
Message copied by Kathi Simpers on Tue May 20, 2011  2:10 PM ------      Message from: O'SULLIVAN, [name]      Created: Tue May 20, 2011 11:38 AM       Please let pt know that his diabetes is at goal.  His liver function tests are elevated however.  I would like him to hold simvastatin for 1 month, then repeat LFT's- diagnosis is elevated LFT's.

## 2011-05-20 NOTE — Telephone Encounter (Signed)
Notified pt and verified that he is taking Clomiphene Citrate 50mg  tabs 1 daily (testosterone). Simvastatin is 40mg  1/2 tab daily.

## 2011-05-20 NOTE — Telephone Encounter (Signed)
Pt notified. Future lab order placed for the week of 06/17/11 and copy forwarded to the lab.

## 2011-05-20 NOTE — ED Notes (Signed)
Patient states he has had weakness and feels weird for the last 3 days.  States his legs feel weak and and he has a weird feeling in his head.  Was seen here on Sunday for the same.  Was seen by his pcp yesterday and found to have elevated lft's.  Stopped his cholesterol meds. Denis chest pain or shortness of  Breath.  States he feels unsteady when he stands and has intermittent nausea.

## 2011-05-20 NOTE — ED Provider Notes (Signed)
History     CSN: [id number]  Arrival date & time 05/20/11  1839   First MD Initiated Contact with Patient 05/20/11 1950      Chief Complaint  Patient presents with  . Weakness    high blood pressure    (Consider location/radiation/quality/duration/timing/severity/associated sxs/prior treatment) Patient is a 46 y.o. male presenting with weakness and sinusitis. The history is provided by the patient. No language interpreter was used.  Weakness The primary symptoms include dizziness and nausea. The symptoms began 5 to 7 days ago. The symptoms are waxing and waning.  He describes the dizziness as faintness. The dizziness began 2 to 5 days ago. The dizziness has been gradually worsening since its onset. Dizziness also occurs with nausea and weakness.   Additional symptoms include weakness. Medical issues also include seizures and hypertension.  Sinusitis  This is a new problem. The current episode started more than 2 days ago. There has been no fever. The fever has been present for less than 1 day. The pain is moderate. The pain has been constant since onset. Associated symptoms include congestion and sinus pressure. Pertinent negatives include no cough. He has tried other medications for the symptoms.  Pt reports he has been taking decongestants.  Pt reports he was in [employer] and felt dizzy like he was going to pass out.  Pt reports his legs felt weak.  Pt reports he has had nasal congestion and pressure in his head  Past Medical History  Diagnosis Date  . Hyperlipidemia   . Diabetes mellitus   . Hypertension   . OSA (obstructive sleep apnea)   . Arthritis   . Allergy   . Personal history of colonic polyps   . GERD (gastroesophageal reflux disease)     Past Surgical History  Procedure Date  . Knee surgery   . Ankle surgery   . Foot surgery   . Shoulder surgery     Family History  Problem Relation Age of Onset  . Diabetes Mother   . Hypertension Mother   . Arthritis  Mother   . Cancer Mother     uterine and breast cancer  . Hyperlipidemia Father   . Arthritis Father   . Cancer Father     prostate cancer  . Hyperlipidemia Maternal Grandmother   . Hypertension Maternal Grandmother   . Hyperlipidemia Maternal Grandfather   . Hypertension Maternal Grandfather   . Hyperlipidemia Paternal Grandmother   . Hypertension Paternal Grandmother   . Hyperlipidemia Paternal Grandfather   . Hypertension Paternal Grandfather   . Heart attack Paternal Grandfather   . Sudden death Neg Hx     History  Substance Use Topics  . Smoking status: Never Smoker   . Smokeless tobacco: Never Used  . Alcohol Use: Yes     occasional wine      Review of Systems  HENT: Positive for congestion, rhinorrhea and sinus pressure.   Respiratory: Negative for cough.   Gastrointestinal: Positive for nausea.  Neurological: Positive for dizziness and weakness.  All other systems reviewed and are negative.    Allergies  Tylox  Home Medications   Current Outpatient Rx  Name Route Sig Dispense Refill  . AMLODIPINE BESYLATE 10 MG PO TABS Oral Take 10 mg by mouth daily.      . ASPIRIN 81 MG PO TABS Oral Take 81 mg by mouth daily.      Marland Kitchen CLOMIPHENE CITRATE 50 MG PO TABS Oral Take 50 mg by mouth daily.    Marland Kitchen  FLUTICASONE PROPIONATE 50 MCG/ACT NA SUSP Nasal Place 2 sprays into the nose daily. 16 g 6  . HYDROCHLOROTHIAZIDE 25 MG PO TABS Oral Take 25 mg by mouth daily.      Marland Kitchen LOSARTAN POTASSIUM 100 MG PO TABS Oral Take 1 tablet (100 mg total) by mouth daily. 30 tablet 3  . METFORMIN HCL 500 MG PO TABS Oral Take 500 mg by mouth 2 (two) times daily with a meal.     . METOPROLOL SUCCINATE ER 25 MG PO TB24 Oral Take 1 tablet (25 mg total) by mouth daily. 30 tablet 3  . MULTIVITAMINS PO CAPS Oral Take 1 capsule by mouth daily.    Marland Kitchen OMEPRAZOLE 20 MG PO CPDR Oral Take 40 mg by mouth every morning.      Marland Kitchen POTASSIUM CHLORIDE ER 10 MEQ PO TBCR Oral Take 1 tablet (10 mEq total) by mouth  daily. 30 tablet 1  . SIMVASTATIN 40 MG PO TABS Oral Take 20 mg by mouth at bedtime.       Ht 6\' 1"  (1.854 m)  Wt 331 lb (150.141 kg)  BMI 43.67 kg/m2  Physical Exam  Nursing note and vitals reviewed. Constitutional: He is oriented to person, place, and time. He appears well-developed and well-nourished.  HENT:  Head: Normocephalic and atraumatic.  Right Ear: External ear normal.  Left Ear: External ear normal.  Nose: Nose normal.  Mouth/Throat: Oropharynx is clear and moist.  Eyes: Conjunctivae are normal. Pupils are equal, round, and reactive to light.  Neck: Normal range of motion. Neck supple.  Cardiovascular: Normal rate.   Pulmonary/Chest: Effort normal.  Abdominal: Soft.  Musculoskeletal: Normal range of motion.  Neurological: He is alert and oriented to person, place, and time. He has normal reflexes.  Skin: Skin is warm and dry.  Psychiatric: He has a normal mood and affect.    ED Course  Procedures (including critical care time)   Labs Reviewed  CBC  DIFFERENTIAL  URINALYSIS, ROUTINE W REFLEX MICROSCOPIC  COMPREHENSIVE METABOLIC PANEL   Ct Head Wo Contrast  05/20/2011  *RADIOLOGY REPORT*  Clinical Data: Headache.  Elevated blood pressure.  Bilateral leg weakness.  CT HEAD WITHOUT CONTRAST  Technique:  Contiguous axial images were obtained from the base of the skull through the vertex without contrast.  Comparison: No priors.  Findings: No acute intracranial abnormalities.  Specifically, no evidence of acute intracranial hemorrhage, mass, mass effect, hydrocephalus or abnormal intra or extra-axial fluid collections. No displaced skull fractures.  Mastoids are well pneumatized bilaterally.  There is extensive mucosal thickening throughout the frontal, ethmoid, sphenoid and maxillary sinuses bilaterally with small air fluid levels layering dependently in the posterior aspect of the maxillary sinuses.  IMPRESSION: 1. Findings consistent with acute sinusitis, as above. 2.   No acute intracranial abnormalities.  Original Report Authenticated By: Florencia Reasons, M.D.     No diagnosis found.    MDM  No significant changes noted when compared with prior ECG.     Date: 05/20/2011  Rate: 91  Rhythm: normal sinus rhythm  QRS Axis: normal  Intervals: normal  ST/T Wave abnormalities: normal  Conduction Disutrbances:none  Narrative Interpretation:   Old EKG Reviewed: unchanged   Results for orders placed during the hospital encounter of 05/20/11  CBC      Component Value Range   WBC 6.1  4.0 - 10.5 (K/uL)   RBC 5.00  4.22 - 5.81 (MIL/uL)   Hemoglobin 14.8  13.0 - 17.0 (g/dL)   HCT  42.6  39.0 - 52.0 (%)   MCV 85.2  78.0 - 100.0 (fL)   MCH 29.6  26.0 - 34.0 (pg)   MCHC 34.7  30.0 - 36.0 (g/dL)   RDW 56.2  13.0 - 86.5 (%)   Platelets 260  150 - 400 (K/uL)  DIFFERENTIAL      Component Value Range   Neutrophils Relative 62  43 - 77 (%)   Neutro Abs 3.8  1.7 - 7.7 (K/uL)   Lymphocytes Relative 28  12 - 46 (%)   Lymphs Abs 1.7  0.7 - 4.0 (K/uL)   Monocytes Relative 9  3 - 12 (%)   Monocytes Absolute 0.6  0.1 - 1.0 (K/uL)   Eosinophils Relative 1  0 - 5 (%)   Eosinophils Absolute 0.0  0.0 - 0.7 (K/uL)   Basophils Relative 0  0 - 1 (%)   Basophils Absolute 0.0  0.0 - 0.1 (K/uL)  COMPREHENSIVE METABOLIC PANEL      Component Value Range   Sodium 139  135 - 145 (mEq/L)   Potassium 3.4 (*) 3.5 - 5.1 (mEq/L)   Chloride 101  96 - 112 (mEq/L)   CO2 28  19 - 32 (mEq/L)   Glucose, Bld 97  70 - 99 (mg/dL)   BUN 11  6 - 23 (mg/dL)   Creatinine, Ser 7.84  0.50 - 1.35 (mg/dL)   Calcium 9.5  8.4 - 69.6 (mg/dL)   Total Protein 7.9  6.0 - 8.3 (g/dL)   Albumin 4.5  3.5 - 5.2 (g/dL)   AST 59 (*) 0 - 37 (U/L)   ALT 93 (*) 0 - 53 (U/L)   Alkaline Phosphatase 76  39 - 117 (U/L)   Total Bilirubin 0.6  0.3 - 1.2 (mg/dL)   GFR calc non Af Amer 90 (*) >90 (mL/min)   GFR calc Af Amer >90  >90 (mL/min)  URINALYSIS, ROUTINE W REFLEX MICROSCOPIC      Component  Value Range   Color, Urine YELLOW  YELLOW    APPearance CLEAR  CLEAR    Specific Gravity, Urine 1.008  1.005 - 1.030    pH 7.0  5.0 - 8.0    Glucose, UA NEGATIVE  NEGATIVE (mg/dL)   Hgb urine dipstick NEGATIVE  NEGATIVE    Bilirubin Urine NEGATIVE  NEGATIVE    Ketones, ur NEGATIVE  NEGATIVE (mg/dL)   Protein, ur NEGATIVE  NEGATIVE (mg/dL)   Urobilinogen, UA 0.2  0.0 - 1.0 (mg/dL)   Nitrite NEGATIVE  NEGATIVE    Leukocytes, UA NEGATIVE  NEGATIVE    Dg Chest 2 View  05/18/2011  *RADIOLOGY REPORT*  Clinical Data: Dizziness.  Presyncope.  CHEST - 2 VIEW  Comparison:  04/08/2010  Findings:  The heart size and mediastinal contours are within normal limits.  Both lungs are clear.  The visualized skeletal structures are unremarkable.  IMPRESSION: No active cardiopulmonary disease.  Original Report Authenticated By: Danae Orleans, M.D.   Ct Head Wo Contrast  05/20/2011  *RADIOLOGY REPORT*  Clinical Data: Headache.  Elevated blood pressure.  Bilateral leg weakness.  CT HEAD WITHOUT CONTRAST  Technique:  Contiguous axial images were obtained from the base of the skull through the vertex without contrast.  Comparison: No priors.  Findings: No acute intracranial abnormalities.  Specifically, no evidence of acute intracranial hemorrhage, mass, mass effect, hydrocephalus or abnormal intra or extra-axial fluid collections. No displaced skull fractures.  Mastoids are well pneumatized bilaterally.  There is extensive mucosal thickening throughout  the frontal, ethmoid, sphenoid and maxillary sinuses bilaterally with small air fluid levels layering dependently in the posterior aspect of the maxillary sinuses.  IMPRESSION: 1. Findings consistent with acute sinusitis, as above. 2.  No acute intracranial abnormalities.  Original Report Authenticated By: Florencia Reasons, M.D.   Pt's Ct shows sinusitis.  This is consistent with pt's symptoms.  I advised see Sandford Craze for recheck in 2-3 days.  Pt given rx for  augmentin  Langston Masker, [place] 05/20/11 2112

## 2011-05-21 ENCOUNTER — Ambulatory Visit: Admitting: Family

## 2011-05-23 ENCOUNTER — Ambulatory Visit (INDEPENDENT_AMBULATORY_CARE_PROVIDER_SITE_OTHER): Admitting: Family

## 2011-05-23 ENCOUNTER — Encounter: Payer: Self-pay | Admitting: Family

## 2011-05-23 DIAGNOSIS — N529 Male erectile dysfunction, unspecified: Secondary | ICD-10-CM

## 2011-05-23 DIAGNOSIS — J329 Chronic sinusitis, unspecified: Secondary | ICD-10-CM | POA: Insufficient documentation

## 2011-05-23 DIAGNOSIS — I10 Essential (primary) hypertension: Secondary | ICD-10-CM

## 2011-05-23 MED ORDER — METOPROLOL SUCCINATE ER 50 MG PO TB24: 50.0000 mg | ORAL_TABLET | Freq: Every day | ORAL | Status: DC

## 2011-05-23 MED ORDER — SILDENAFIL CITRATE 100 MG PO TABS: 50.0000 mg | ORAL_TABLET | Freq: Every day | ORAL | Status: DC | PRN

## 2011-05-23 NOTE — Progress Notes (Signed)
Subjective:    Patient ID: [name], male    DOB: [birth year], 46 y.o.   MRN: [id number]  HPI  Mr.  Scheel is a 46 yr old male who presents today for follow up.  He was seen in the ED due to dizziness on 2/5.  He underwent a CT of the head which was consistent with acute sinusitis. Pt was prescribed Augmentin.  Since starting the medication he notes improvement, but not complete resolution of his dizziness.  HTN- Pt started low dose of metoprolol last visit. Notes that he is tolerating this medication without difficulty. Reports home BP readings at home have been better. Notes that his BP fluctuates up and down all day long. Some readings in the 120's systolic.    ED- reports that he is needing a refill on his viagra.  Dr. 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  Review of Systems See HPI  Past Medical History  Diagnosis Date  . Hyperlipidemia   . Diabetes mellitus   . Hypertension   . OSA (obstructive sleep apnea)   . Arthritis   . Allergy   . Personal history of colonic polyps   . GERD (gastroesophageal reflux disease)     History   Social History  . Marital Status: Married    Spouse Name: N/A    Number of Children: 2  . Years of Education: N/A   Occupational History  . FOOTBALL COACH    Social History Main Topics  . Smoking status: Never Smoker   . Smokeless tobacco: Never Used  . Alcohol Use: Yes     occasional wine  . Drug Use: No  . Sexually Active: Not on file   Other Topics Concern  . Not on file   Social History Narrative   Regular exercise:  NoCaffeine use:  2--32oz cups daily    Past Surgical History  Procedure Date  . Knee surgery   . Ankle surgery   . Foot surgery   . Shoulder surgery     Family History  Problem Relation Age of Onset  . Diabetes Mother   . Hypertension Mother   . Arthritis Mother   . Cancer Mother     uterine and breast cancer  . Hyperlipidemia Father   . Arthritis Father   . Cancer  Father     prostate cancer  . Hyperlipidemia Maternal Grandmother   . Hypertension Maternal Grandmother   . Hyperlipidemia Maternal Grandfather   . Hypertension Maternal Grandfather   . Hyperlipidemia Paternal Grandmother   . Hypertension Paternal Grandmother   . Hyperlipidemia Paternal Grandfather   . Hypertension Paternal Grandfather   . Heart attack Paternal Grandfather   . Sudden death Neg Hx     Allergies  Allergen Reactions  . Tylox Hives and Itching    Current Outpatient Prescriptions on File Prior to Visit  Medication Sig Dispense Refill  . amLODipine (NORVASC) 10 MG tablet Take 10 mg by mouth daily.        Marland Kitchen amoxicillin-clavulanate (AUGMENTIN) 875-125 MG per tablet Take 1 tablet by mouth 2 (two) times daily.  20 tablet  0  . aspirin 81 MG tablet Take 81 mg by mouth daily.        . clomiPHENE (CLOMID) 50 MG tablet Take 50 mg by mouth daily.      . fluticasone (FLONASE) 50 MCG/ACT nasal spray Place 2 sprays into the nose daily.  16 g  6  . hydrochlorothiazide (  HYDRODIURIL) 25 MG tablet Take 25 mg by mouth daily.        Marland Kitchen losartan (COZAAR) 100 MG tablet Take 1 tablet (100 mg total) by mouth daily.  30 tablet  3  . metFORMIN (GLUCOPHAGE) 500 MG tablet Take 500 mg by mouth 2 (two) times daily with a meal.       . Multiple Vitamin (MULTIVITAMIN) capsule Take 1 capsule by mouth daily.      Marland Kitchen omeprazole (PRILOSEC) 20 MG capsule Take 40 mg by mouth every morning.        . potassium chloride (K-DUR) 10 MEQ tablet Take 1 tablet (10 mEq total) by mouth daily.  30 tablet  1  . simvastatin (ZOCOR) 40 MG tablet Take 20 mg by mouth at bedtime.         BP 160/80  Pulse 102  Temp(Src) 98.2 F (36.8 C) (Oral)  Resp 18  Ht 6\' 1"  (1.854 m)  Wt 325 lb (147.419 kg)  BMI 42.88 kg/m2  SpO2 96%       Objective:   Physical Exam  Constitutional: He is oriented to person, place, and time. He appears well-developed and well-nourished. No distress.  HENT:  Head: Normocephalic and  atraumatic.  Right Ear: Tympanic membrane and ear canal normal.  Left Ear: Hearing, tympanic membrane and external ear normal.  Mouth/Throat: No oropharyngeal exudate or posterior oropharyngeal edema.       No sinus tenderness to palpation.  Eyes: Conjunctivae are normal. No scleral icterus.  Neck: Normal range of motion. Neck supple.  Cardiovascular: Normal rate and regular rhythm.   No murmur heard. Pulmonary/Chest: Effort normal and breath sounds normal. No respiratory distress. He has no wheezes. He has no rales.  Lymphadenopathy:    He has no cervical adenopathy.  Neurological: He is alert and oriented to person, place, and time.  Psychiatric: He has a normal mood and affect. His behavior is normal. Judgment and thought content normal.          Assessment & Plan:

## 2011-05-23 NOTE — Assessment & Plan Note (Signed)
BP Readings from Last 3 Encounters:  05/23/11 160/80  05/19/11 146/92  05/18/11 175/99  BP is improving.  Repeat manual BP check by me was 152/85.  Will push toprol from 25 to 50mg .  He will check his blood pressure daily at home and call us if consistently running higher than 140/90 on this regimen.

## 2011-05-23 NOTE — Patient Instructions (Signed)
Please follow up in March as scheduled.  

## 2011-05-23 NOTE — Assessment & Plan Note (Signed)
Improving. I discussed with him that the dizziness should continue to improve as his sinus congestion resolves. Continue augmentin.

## 2011-05-23 NOTE — Assessment & Plan Note (Signed)
Refill viagra 

## 2011-05-26 ENCOUNTER — Telehealth: Payer: Self-pay | Admitting: Family

## 2011-05-26 NOTE — Telephone Encounter (Signed)
Message copied by Sandford Craze on Mon May 26, 2011  9:53 AM ------      Message from: Darral Dash E      Created: Mon May 26, 2011  9:43 AM           Spoke with Nutrition Management @ [facility] they will call pt to schedule              ----- Message -----         From: Katrinka Blazing. Peggyann Juba, NP         Sent: 05/23/2011   1:14 PM           To: Keith Rake Aheron            Pt was asking status of nutrition referral please?

## 2011-05-27 ENCOUNTER — Telehealth: Payer: Self-pay | Admitting: *Deleted

## 2011-05-27 DIAGNOSIS — J329 Chronic sinusitis, unspecified: Secondary | ICD-10-CM

## 2011-05-27 MED ORDER — LEVOFLOXACIN 750 MG PO TABS: 750.0000 mg | ORAL_TABLET | Freq: Every day | ORAL | Status: AC

## 2011-05-27 NOTE — Telephone Encounter (Signed)
Pt left voice message that he has almost completed antibiotic and has not had resolution of sinus symptoms. Also reports BP ranging from 140s-160s/85-90s. Pt states it mostly stays around 160/90s. Wants to know when he should see an improvement of his BP. Attempted to reach pt and reached recording that voicemail box was full.

## 2011-05-27 NOTE — Telephone Encounter (Signed)
Pt returned my call and states he is still having dizziness and light pain in right ear but feels worse in left ear. Feels like he is "walking on a cloud and going down hill". States palms of hands break out into a sweat while he is sitting still and this has never happened to him before. States BP is fluctuating throughout the day and seems to be worse after standing from a lying position. Pt reports compliance with medications. Please advise.

## 2011-05-27 NOTE — Telephone Encounter (Signed)
Spoke with pt.  Recommended that he stop Augmentin, start Levaquin, claritin once daily.  Plan referral to ENT.  He is to call me if symptoms worsen.  Pt verbalizes understanding.

## 2011-06-06 ENCOUNTER — Telehealth: Payer: Self-pay | Admitting: *Deleted

## 2011-06-06 NOTE — Telephone Encounter (Signed)
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  Pt request letter to cover dates 05/11/11 through 05/31/11.  Pt states he completed Levaquin, still has some dizziness and post nasal drip. Has not started Claritin yet but will pick it up today. Has appt with ENT on 06/12/11. Letter printed for 05/19/11 through 05/31/11 as we cannot provide letter prior to date of initial contact per Sandford Craze, FNP. Pt notified.

## 2011-06-11 ENCOUNTER — Encounter: Attending: Family | Admitting: Dietician

## 2011-06-11 ENCOUNTER — Encounter: Payer: Self-pay | Admitting: Dietician

## 2011-06-11 DIAGNOSIS — Z713 Dietary counseling and surveillance: Secondary | ICD-10-CM | POA: Insufficient documentation

## 2011-06-11 NOTE — Progress Notes (Signed)
Medical Nutrition Therapy:  Appt start time: 0845 end time:  1000.  Assessment:  Primary concerns today: Comes today for nutritional counseling for weight loss, glucose and blood pressure control.  Notes that in the 7 years since he was discharged from the [facility], he has gained over 100-125 lb. He currently is on 4 medications for blood pressure.  He wants to loose weight and plans to have the gastric sleeve procedure.  He has attended the Bariatric Surgery Review Session and has completed his paper work.  He needs to get a letter from his physician for this process to move forward.  He is interested in loosing weight prior to the surgery.  MEDICATIONS: Medications for blood pressure;norvasc, Hydrodiuril, Cozaar, Toprol-XL  And Metformin 500 mg twice daily for blood glucose.   DIETARY INTAKE:  24-hr recall:  B (8:00-8:30 AM): Honey Nut Cheerios 4.5 cups, Lactaid 2% milk 1.5 = 2 cups, OR Oatmeal 3 cups, use pure cane raw sugar at 3 tablespoons, and few tablespoons of milk,  with 2 boiled eggs with pepper, and sprinkle of salt Wanita Derenzo have glass of apple juice 16 oz,  or a bottle of water.  OR McDonalds, egg McMuffin with no cheese,  Snk (mid AM) :Rare, cheese and crackers (cheese sticks) Amanuel [place] have chocolate PB cookies ir thin mints.   L (03-1229 PM): Chef salad with eggs, no ham , [place] and cheese,   If cheese, no eggs.  Use french dressing 1/4 cup. OR chinese shrimp and broccoli with white rice 1/2 cup or shrimp with the fine noodles with carrots and vegetables and cup of hot and sour soup.  No MSG.  Drink 3 glass water and 1 can gingerale [employer], chicken salad with lettuce spinach with the grill chicken wight tomatoes, onion, peppers, and cheese or ham and [place] sub with the veggies,12 inch sandwich wit =/- baked potato chips and water or diet soda. Snk (mid PM): normally noen D (4:30-5:00 PM): crab legs, 2 corn on the cobs, 1/2 cup mash potatoes and gravy, 1 cup of cooked apples, drank apple juice.  OR  Broccoli and cheddar soup and 1/2 of loaded spud with pepper, olives, cheese, spring onions, cheese, sour cream and 2 butter containers.  water Snk (HS PM): [place], choc chip cookie with french vanilla and chocolate sprinkles. Beverages: water, gingerale, juice, country time 1/2 lemonade and 1/2 tea,   Meals are high in calories, carbs and sodium. Breakfast can range from approximately [phone] calories.  Increased use of concentrated sweets.  Recent physical activity: just started.  Walk 30 minutes each night.in the neighborhood that has [place]. 5 days per week.  Estimated energy needs:Ht: 73 in  WT: 322.1 lb  BMI: 47.7%  Adjusted Wt:  236 lb (107 kg)   1800-1900 calories for weight loss. 205-210 g carbohydrates 135-140 g protein 50-52 g fat  Progress Towards Goal(s):  In progress.   Nutritional Diagnosis:  [place]-3.3 Overweight/obesity As related to increased intake of concentrated sweets, large portion sizes, and use ojuice and regular soda.  As evidenced by weight of 322 lb with a BMI of 47.7%..    Intervention:  Nutrition Nutrition counseling for methods to reduce calorie, carbohydrate and sodium intake.  Handouts given during visit include:  Nutrition and Weight loss.  5-day menu suggestions for 1800 calories  Carbohydrate counting guide for suggestion regarding fruit portions and non-starchy vegetable suggestions.  Monitoring/Evaluation:  Dietary intake, exercise, blood glucose levels, and body weight inn 8-12 weeks.  Needs  to check with insurance to see how many hours of nutrition counseling he will be allowed for his pre and post-op gastric surgery procedure.

## 2011-06-11 NOTE — Patient Instructions (Addendum)
   Continue to walk in your neighborhood. Continue the 5 times per week.  Remember when you are not conditioned, you need to start low and slow and increase your pace.    Continue to eat regular meals and snacks.  Plan to check your blood glucose on a more regular basis.  If you need more strips, you Charles Nash need to get your MD to write a prescription for more frequent testing.    Try to test some glucose levels, 2 hours after the first bite of your largest meal of the day.  The goal is for the glucose level at the 2 hour mark to be 160 mg or less.  Fasting goal is at 100-120 mg.  A high fasting can reflect the meal and snack from the evening before.  Read your food labels and evaluate for portion size, sodium level and sugar content.  Goal for lowering sodium:  Keep your sodium intake at 2000 mg per day.  Aim for this.  Many of our prepared foods have more added sodium than we need.  The food label will help with this.Consider aiming for 500 mg at breakfast, lunch, and dinner with 500 mg left for snacks.  Fruit is a good snack that does not contain sodium as a rule.  Resource for calories, sugar and sodium in foods on the web is caloriekimg.com,    Resource for potassium and sodium levels for foods without a food label would be the USDA nutrition counts web site.  Try to use more water.  With weight loss, we recommend using 92 oz per day.  Use less sugar.  Use water rather than sweetened soda or tea.  Consider using the diet soda and using the sugar substitute Splenda for sweetening foods.  It taste sweet but does not convert to glucose.  Continue to bake, broil, grill, roast and avoid frying.  Use the leaner cuts of meats.  Use poultry and fish more than the red meats.  For every snack, include a protein source.  Nuts that are roasted and no salt added are better for blood pressure issues.  When using breads, cereals, grains and pasta; try to have 2 grams of fiber per serving of bread and 3  grams of fiber for each serving of cereal, pasta, beans.

## 2011-06-16 ENCOUNTER — Telehealth: Payer: Self-pay | Admitting: *Deleted

## 2011-06-16 LAB — HEPATIC FUNCTION PANEL
AST: 71 U/L — ABNORMAL HIGH (ref 0–37)
Alkaline Phosphatase: 61 U/L (ref 39–117)
Bilirubin, Direct: 0.2 mg/dL (ref 0.0–0.3)
Total Bilirubin: 0.8 mg/dL (ref 0.3–1.2)

## 2011-06-16 NOTE — Telephone Encounter (Signed)
Pt presented to the lab for repeat LFTs. Future order released and forwarded to the lab.

## 2011-06-19 ENCOUNTER — Telehealth: Payer: Self-pay | Admitting: Family

## 2011-06-19 NOTE — Telephone Encounter (Signed)
Notified pt. States he was evaluated at the [facility] for degenerative neck disease. They are referring him to a neurosurgeon.  They gave him Tramadol 50mg . States he took medication yesterday and felt like the blood was rushing to his face through the night.  Reports that he has not taken any more. Advised pt to contact the prescriber and make them aware of the symptoms and for further direction.  Pt voices understanding.

## 2011-06-19 NOTE — Telephone Encounter (Signed)
Pls call pt and let him know that his lft's are still elevated.  I would like him to hold zocor- we will repeat his blood work at his upcoming apt on 4/1.

## 2011-06-19 NOTE — Telephone Encounter (Signed)
Patient is requesting last lab results 

## 2011-06-27 ENCOUNTER — Telehealth: Payer: Self-pay | Admitting: Family

## 2011-06-27 NOTE — Telephone Encounter (Signed)
Received medical records from [facility] of [facility]

## 2011-07-03 ENCOUNTER — Encounter: Payer: Self-pay | Admitting: *Deleted

## 2011-07-03 NOTE — Telephone Encounter (Signed)
Records forwarded to Provider for review. 

## 2011-07-04 ENCOUNTER — Telehealth: Payer: Self-pay | Admitting: *Deleted

## 2011-07-04 NOTE — Telephone Encounter (Signed)
Pt left voice message stating he was supposed to have had an MRI @ the [facility] in [place] but their equipment was too small. They told him to come to his PMD to have MRI performed on a larger machine. Pt is also requesting pre-medication for claustrophobia. Left message for pt to return my call to verify what type of MRI and dx.

## 2011-07-07 NOTE — Telephone Encounter (Signed)
Notified pt and he states he will be out of town the 1st through the 5th. Rescheduled CPE for 07/28/11 at 9am.

## 2011-07-07 NOTE — Telephone Encounter (Signed)
Pt states he needs to have an MRI of his cervical spine due to degenerative disc disease. Pt states the machine at the [facility] clinic was too small and they told him he needs to have an MRI on a larger machine. Pt requests medication to help with his claustrophobia prior to MRI.  Please advise.

## 2011-07-07 NOTE — Telephone Encounter (Signed)
I will be happy to arrange for him, but will need to get more information from him first.  Will plan to discuss at his apt next week.

## 2011-07-11 ENCOUNTER — Other Ambulatory Visit: Payer: Self-pay | Admitting: Family

## 2011-07-14 ENCOUNTER — Encounter: Admitting: Family

## 2011-07-28 ENCOUNTER — Encounter: Admitting: Family

## 2011-07-28 ENCOUNTER — Encounter: Payer: Self-pay | Admitting: Gastroenterology

## 2011-07-28 ENCOUNTER — Ambulatory Visit (INDEPENDENT_AMBULATORY_CARE_PROVIDER_SITE_OTHER): Admitting: Family

## 2011-07-28 ENCOUNTER — Encounter: Payer: Self-pay | Admitting: Family

## 2011-07-28 VITALS — BP 148/100 | HR 84 | Temp 98.0°F | Resp 16 | Ht 73.0 in | Wt 315.1 lb

## 2011-07-28 DIAGNOSIS — E119 Type 2 diabetes mellitus without complications: Secondary | ICD-10-CM

## 2011-07-28 DIAGNOSIS — M5090 Cervical disc disorder, unspecified, unspecified cervical region: Secondary | ICD-10-CM

## 2011-07-28 DIAGNOSIS — K635 Polyp of colon: Secondary | ICD-10-CM

## 2011-07-28 DIAGNOSIS — R195 Other fecal abnormalities: Secondary | ICD-10-CM

## 2011-07-28 DIAGNOSIS — L909 Atrophic disorder of skin, unspecified: Secondary | ICD-10-CM

## 2011-07-28 DIAGNOSIS — I10 Essential (primary) hypertension: Secondary | ICD-10-CM

## 2011-07-28 DIAGNOSIS — Z0000 Encounter for general adult medical examination without abnormal findings: Secondary | ICD-10-CM | POA: Insufficient documentation

## 2011-07-28 DIAGNOSIS — Z111 Encounter for screening for respiratory tuberculosis: Secondary | ICD-10-CM

## 2011-07-28 DIAGNOSIS — R7989 Other specified abnormal findings of blood chemistry: Secondary | ICD-10-CM

## 2011-07-28 DIAGNOSIS — L918 Other hypertrophic disorders of the skin: Secondary | ICD-10-CM

## 2011-07-28 LAB — CBC WITH DIFFERENTIAL/PLATELET
Eosinophils Absolute: 0.1 10*3/uL (ref 0.0–0.7)
Eosinophils Relative: 2 % (ref 0–5)
HCT: 47.3 % (ref 39.0–52.0)
Lymphocytes Relative: 46 % (ref 12–46)
Lymphs Abs: 1.9 10*3/uL (ref 0.7–4.0)
MCH: 29.6 pg (ref 26.0–34.0)
MCV: 88.7 fL (ref 78.0–100.0)
Monocytes Absolute: 0.5 10*3/uL (ref 0.1–1.0)
Monocytes Relative: 12 % (ref 3–12)
Platelets: 263 10*3/uL (ref 150–400)
RBC: 5.33 MIL/uL (ref 4.22–5.81)
WBC: 4 10*3/uL (ref 4.0–10.5)

## 2011-07-28 MED ORDER — METOPROLOL SUCCINATE ER 100 MG PO TB24: 100.0000 mg | ORAL_TABLET | Freq: Every day | ORAL | Status: DC

## 2011-07-28 NOTE — Assessment & Plan Note (Signed)
Skin tag left nare was frozen today using liquid nitrogen. Pt tolerated without difficulty.

## 2011-07-28 NOTE — Assessment & Plan Note (Signed)
Pt is noted to have elevated BP today, but tells me that he has not taken his bp meds yet today.  He reports that his home BP readings have generally been running 120-130's over 60-80's.  Will continue current meds at current dosages.

## 2011-07-28 NOTE — Assessment & Plan Note (Signed)
Statin on hold, obtain follow up LFT's.

## 2011-07-28 NOTE — Assessment & Plan Note (Signed)
Now with increased neck pain and decreased mobility.  Obtain MRI of the C spine.

## 2011-07-28 NOTE — Assessment & Plan Note (Signed)
Will refer to GI for further evaluation. Also, obtain CBC.  Pt is due for 5 yr follow up colo for hx of polyps.

## 2011-07-28 NOTE — Patient Instructions (Addendum)
Please complete your lab work prior to leaving. Follow up Wednesday for your PPD reading. Check you blood pressure daily.  Call if BP >160/100. Follow up in 2 month for a regular visit. Preventive Care for Adults, Male A healthy lifestyle and preventative care can promote health and wellness. Preventative health guidelines for men include the following key practices:  A routine yearly physical is a good way to check with your caregiver about your health and preventative screening. It is a chance to share any concerns and updates on your health, and to receive a thorough exam.   Visit your dentist for a routine exam and preventative care every 6 months. Brush your teeth twice a day and floss once a day. Good oral hygiene prevents tooth decay and gum disease.   The frequency of eye exams is based on your age, health, family medical history, use of contact lenses, and other factors. Follow your caregiver's recommendations for frequency of eye exams.   Eat a healthy diet. Foods like vegetables, fruits, whole grains, low-fat dairy products, and lean protein foods contain the nutrients you need without too many calories. Decrease your intake of foods high in solid fats, added sugars, and salt. Eat the right amount of calories for you.Get information about a proper diet from your caregiver, if necessary.   Regular physical exercise is one of the most important things you can do for your health. Most adults should get at least 150 minutes of moderate-intensity exercise (any activity that increases your heart rate and causes you to sweat) each week. In addition, most adults need muscle-strengthening exercises on 2 or more days a week.   Maintain a healthy weight. The body mass index (BMI) is a screening tool to identify possible weight problems. It provides an estimate of body fat based on height and weight. Your caregiver can help determine your BMI, and can help you achieve or maintain a healthy  weight.For adults 20 years and older:   A BMI below 18.5 is considered underweight.   A BMI of 18.5 to 24.9 is normal.   A BMI of 25 to 29.9 is considered overweight.   A BMI of 30 and above is considered obese.   Maintain normal blood lipids and cholesterol levels by exercising and minimizing your intake of saturated fat. Eat a balanced diet with plenty of fruit and vegetables. Blood tests for lipids and cholesterol should begin at age 30 and be repeated every 5 years. If your lipid or cholesterol levels are high, you are over 50, or you are a high risk for heart disease, you may need your cholesterol levels checked more frequently.Ongoing high lipid and cholesterol levels should be treated with medicines if diet and exercise are not effective.   If you smoke, find out from your caregiver how to quit. If you do not use tobacco, do not start.   If you choose to drink alcohol, do not exceed 2 drinks per day. One drink is considered to be 12 ounces (355 mL) of beer, 5 ounces (148 mL) of wine, or 1.5 ounces (44 mL) of liquor.   Avoid use of street drugs. Do not share needles with anyone. Ask for help if you need support or instructions about stopping the use of drugs.   High blood pressure causes heart disease and increases the risk of stroke. Your blood pressure should be checked at least every 1 to 2 years. Ongoing high blood pressure should be treated with medicines, if weight loss and  exercise are not effective.   If you are 51 to 46 years old, ask your caregiver if you should take aspirin to prevent heart disease.   Diabetes screening involves taking a blood sample to check your fasting blood sugar level. This should be done once every 3 years, after age 49, if you are within normal weight and without risk factors for diabetes. Testing should be considered at a younger age or be carried out more frequently if you are overweight and have at least 1 risk factor for diabetes.   Colorectal  cancer can be detected and often prevented. Most routine colorectal cancer screening begins at the age of 14 and continues through age 79. However, your caregiver may recommend screening at an earlier age if you have risk factors for colon cancer. On a yearly basis, your caregiver may provide home test kits to check for hidden blood in the stool. Use of a small camera at the end of a tube, to directly examine the colon (sigmoidoscopy or colonoscopy), can detect the earliest forms of colorectal cancer. Talk to your caregiver about this at age 72, when routine screening begins. Direct examination of the colon should be repeated every 5 to 10 years through age 58, unless early forms of pre-cancerous polyps or small growths are found.   Hepatitis C blood testing is recommended for all people born from [birth year] through 1965 and any individual with known risks for hepatitis C.   Practice safe sex. Use condoms and avoid high-risk sexual practices to reduce the spread of sexually transmitted infections (STIs). STIs include gonorrhea, chlamydia, syphilis, trichomonas, herpes, HPV, and human immunodeficiency virus (HIV). Herpes, HIV, and HPV are viral illnesses that have no cure. They can result in disability, cancer, and death.   A one-time screening for abdominal aortic aneurysm (AAA) and surgical repair of large AAAs by sound wave imaging (ultrasonography) is recommended for ages 65 to 17 years who are current or former smokers.   Healthy men should no longer receive prostate-specific antigen (PSA) blood tests as part of routine cancer screening. Consult with your caregiver about prostate cancer screening.   Testicular cancer screening is not recommended for adult males who have no symptoms. Screening includes self-exam, caregiver exam, and other screening tests. Consult with your caregiver about any symptoms you have or any concerns you have about testicular cancer.   Use sunscreen with skin protection factor  (SPF) of 30 or more. Apply sunscreen liberally and repeatedly throughout the day. You should seek shade when your shadow is shorter than you. Protect yourself by wearing long sleeves, pants, a wide-brimmed hat, and sunglasses year round, whenever you are outdoors.   Once a month, do a whole body skin exam, using a mirror to look at the skin on your back. Notify your caregiver of new moles, moles that have irregular borders, moles that are larger than a pencil eraser, or moles that have changed in shape or color.   Stay current with required immunizations.   Influenza. You need a dose every fall (or winter). The composition of the flu vaccine changes each year, so being vaccinated once is not enough.   Pneumococcal polysaccharide. You need 1 to 2 doses if you smoke cigarettes or if you have certain chronic medical conditions. You need 1 dose at age 56 (or older) if you have never been vaccinated.   Tetanus, diphtheria, pertussis (Tdap, Td). Get 1 dose of Tdap vaccine if you are younger than age 71 years, are over 27  and have contact with an infant, are a Research scientist (physical sciences), or simply want to be protected from whooping cough. After that, you need a Td booster dose every 10 years. Consult your caregiver if you have not had at least 3 tetanus and diphtheria-containing shots sometime in your life or have a deep or dirty wound.   HPV. This vaccine is recommended for males 13 through 45 years of age. This vaccine may be given to men 22 through 45 years of age who have not completed the 3 dose series. It is recommended for men through age 79 who have sex with men or whose immune system is weakened because of HIV infection, other illness, or medications. The vaccine is given in 3 doses over 6 months.   Measles, mumps, rubella (MMR). You need at least 1 dose of MMR if you were born in 1957 or later. You may also need a 2nd dose.   Meningococcal. If you are age 51 to 13 years and a Orthoptist  living in a residence hall, or have one of several medical conditions, you need to get vaccinated against meningococcal disease. You may also need additional booster doses.   Zoster (shingles). If you are age 47 years or older, you should get this vaccine.   Varicella (chickenpox). If you have never had chickenpox or you were vaccinated but received only 1 dose, talk to your caregiver to find out if you need this vaccine.   Hepatitis A. You need this vaccine if you have a specific risk factor for hepatitis A virus infection, or you simply wish to be protected from this disease. The vaccine is usually given as 2 doses, 6 to 18 months apart.   Hepatitis B. You need this vaccine if you have a specific risk factor for hepatitis B virus infection or you simply wish to be protected from this disease. The vaccine is given in 3 doses, usually over 6 months.  Preventative Service / Frequency Ages 59 to 79  Blood pressure check.** / Every 1 to 2 years.   Lipid and cholesterol check.** / Every 5 years beginning at age 89.   Hepatitis C blood test.** / For any individual with known risks for hepatitis C.   Skin self-exam. / Monthly.   Influenza immunization.** / Every year.   Pneumococcal polysaccharide immunization.** / 1 to 2 doses if you smoke cigarettes or if you have certain chronic medical conditions.   Tetanus, diphtheria, pertussis (Tdap,Td) immunization. / A one-time dose of Tdap vaccine. After that, you need a Td booster dose every 10 years.   HPV immunization. / 3 doses over 6 months, if 26 and younger.   Measles, mumps, rubella (MMR) immunization. / You need at least 1 dose of MMR if you were born in 1957 or later. You may also need a 2nd dose.   Meningococcal immunization. / 1 dose if you are age 68 to 4 years and a Orthoptist living in a residence hall, or have one of several medical conditions, you need to get vaccinated against meningococcal disease. You may also  need additional booster doses.   Varicella immunization.** / Consult your caregiver.   Hepatitis A immunization.** / Consult your caregiver. 2 doses, 6 to 18 months apart.   Hepatitis B immunization.** / Consult your caregiver. 3 doses usually over 6 months.  Ages 4 to 29  Blood pressure check.** / Every 1 to 2 years.   Lipid and cholesterol check.** / Every 5 years  beginning at age 19.   Fecal occult blood test (FOBT) of stool. / Every year beginning at age 50 and continuing until age 34. You may not have to do this test if you get colonoscopy every 10 years.   Flexible sigmoidoscopy** or colonoscopy.** / Every 5 years for a flexible sigmoidoscopy or every 10 years for a colonoscopy beginning at age 30 and continuing until age 15.   Hepatitis C blood test.** / For all people born from [birth year] through 1965 and any individual with known risks for hepatitis C.   Skin self-exam. / Monthly.   Influenza immunization.** / Every year.   Pneumococcal polysaccharide immunization.** / 1 to 2 doses if you smoke cigarettes or if you have certain chronic medical conditions.   Tetanus, diphtheria, pertussis (Tdap/Td) immunization.** / A one-time dose of Tdap vaccine. After that, you need a Td booster dose every 10 years.   Measles, mumps, rubella (MMR) immunization. / You need at least 1 dose of MMR if you were born in 1957 or later. You may also need a 2nd dose.   Varicella immunization.**/ Consult your caregiver.   Meningococcal immunization.** / Consult your caregiver.   Hepatitis A immunization.** / Consult your caregiver. 2 doses, 6 to 18 months apart.   Hepatitis B immunization.** / Consult your caregiver. 3 doses, usually over 6 months.  Ages 51 and over  Blood pressure check.** / Every 1 to 2 years.   Lipid and cholesterol check.**/ Every 5 years beginning at age 33.   Fecal occult blood test (FOBT) of stool. / Every year beginning at age 76 and continuing until age 73. You may not  have to do this test if you get colonoscopy every 10 years.   Flexible sigmoidoscopy** or colonoscopy.** / Every 5 years for a flexible sigmoidoscopy or every 10 years for a colonoscopy beginning at age 78 and continuing until age 65.   Hepatitis C blood test.** / For all people born from [birth year] through 1965 and any individual with known risks for hepatitis C.   Abdominal aortic aneurysm (AAA) screening.** / A one-time screening for ages 49 to 15 years who are current or former smokers.   Skin self-exam. / Monthly.   Influenza immunization.** / Every year.   Pneumococcal polysaccharide immunization.** / 1 dose at age 43 (or older) if you have never been vaccinated.   Tetanus, diphtheria, pertussis (Tdap, Td) immunization. / A one-time dose of Tdap vaccine if you are over 65 and have contact with an infant, are a Research scientist (physical sciences), or simply want to be protected from whooping cough. After that, you need a Td booster dose every 10 years.   Varicella immunization. ** / Consult your caregiver.   Meningococcal immunization.** / Consult your caregiver.   Hepatitis A immunization. ** / Consult your caregiver. 2 doses, 6 to 18 months apart.   Hepatitis B immunization.** / Check with your caregiver. 3 doses, usually over 6 months.  **Family history and personal history of risk and conditions may change your caregiver's recommendations. Document Released: 05/27/2001 Document Revised: 03/20/2011 Document Reviewed: 08/26/2010 [facility] Patient Information 2012 [place], [place].

## 2011-07-28 NOTE — Assessment & Plan Note (Signed)
Pt counseled on diet, exercise and weight loss.  Obtain PSA today and FLP. Will also check CBC due to heme + stool.  Other labs up to date.  Refer for follow up colo.  Immunizations reviewed and up to date.

## 2011-07-28 NOTE — Progress Notes (Signed)
Subjective:    Patient ID: Charles Nash, male    DOB: 1966-02-23, 46 y.o.   MRN: [id number]  HPI  Mr.  Nash is a 46 yr old male here today for physical and to discuss MRI of the C-spine.   Neck pain- reports that he has hx of DDD of C-spine.  He has trouble with mobility side to side.  Difficulty turning to the left.  + associated back pain.  He denies current problem with neck/arm pain/numbness.  Wants MRI to further evaluate- this is at the direction of the [place].  Preventative- [place]- had one in 2008- was told that he was due for follow up in 5 yrs.  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  Walks the track.  Trying to watch his weight.  Immunizations reviewed.    Wants skin tag on nose removed.  Finds it bothersome.   Review of Systems  Constitutional: Negative for unexpected weight change.  HENT: Positive for rhinorrhea and neck pain.   Eyes: Negative for visual disturbance.  Respiratory: Negative for cough.   Cardiovascular: Negative for chest pain and palpitations.  Gastrointestinal: Negative for nausea.  Genitourinary: Negative for dysuria and frequency.  Musculoskeletal: Positive for back pain.       R knee pain ( s/p 2 right knee arthroscopies)  Skin: Negative for rash.  Neurological: Negative for headaches.  Hematological: Negative for adenopathy.  Psychiatric/Behavioral:       Notes some mild depression due to employment problems     Past Medical History  Diagnosis Date  . Hyperlipidemia   . Diabetes mellitus   . Hypertension   . OSA (obstructive sleep apnea)   . Arthritis   . Allergy   . Personal history of colonic polyps   . GERD (gastroesophageal reflux disease)     History   Social History  . Marital Status: Married    Spouse Name: N/A    Number of Children: 2  . Years of Education: N/A   Occupational History  . FOOTBALL COACH    Social History Main Topics  . Smoking status: Never Smoker   . Smokeless tobacco: Never Used  . Alcohol Use: Yes   occasional wine  . Drug Use: No  . Sexually Active: Not on file   Other Topics Concern  . Not on file   Social History Narrative   Regular exercise:  NoCaffeine use:  2--32oz cups daily    Past Surgical History  Procedure Date  . Knee surgery 08/04/08    Right knee-- medial meniscus repair, attenuation anterior cruciate [name] MD [facility])   . Ankle surgery   . Foot surgery   . Shoulder surgery     Family History  Problem Relation Age of Onset  . Diabetes Mother   . Hypertension Mother   . Arthritis Mother   . Cancer Mother     uterine and breast cancer  . Hyperlipidemia Father   . Arthritis Father   . Cancer Father     prostate cancer  . Hyperlipidemia Maternal Grandmother   . Hypertension Maternal Grandmother   . Hyperlipidemia Maternal Grandfather   . Hypertension Maternal Grandfather   . Hyperlipidemia Paternal Grandmother   . Hypertension Paternal Grandmother   . Hyperlipidemia Paternal Grandfather   . Hypertension Paternal Grandfather   . Heart attack Paternal Grandfather   . Sudden death Neg Hx     Allergies  Allergen Reactions  . Tylox Hives and Itching    Current Outpatient  Prescriptions on File Prior to Visit  Medication Sig Dispense Refill  . amLODipine (NORVASC) 10 MG tablet Take 10 mg by mouth daily.        Marland Kitchen aspirin 81 MG tablet Take 81 mg by mouth daily.        . clomiPHENE (CLOMID) 50 MG tablet Take 50 mg by mouth daily.      . Exenatide (BYETTA 5 MCG PEN [place]) Inject 5 mg into the skin 2 (two) times daily.      . FENOFIBRATE PO Take 1 tablet by mouth at bedtime.      . fluticasone (FLONASE) 50 MCG/ACT nasal spray Place 2 sprays into the nose daily.  16 g  6  . hydrochlorothiazide (HYDRODIURIL) 25 MG tablet Take 25 mg by mouth daily.       Marland Kitchen loratadine (CLARITIN) 10 MG tablet Take 10 mg by mouth daily.      Marland Kitchen losartan (COZAAR) 100 MG tablet Take 1 tablet (100 mg total) by mouth daily.  30 tablet  3  . metoprolol succinate  (TOPROL-XL) 50 MG 24 hr tablet Take 1 tablet (50 mg total) by mouth daily. Take with or immediately following a meal.  30 tablet  3  . Multiple Vitamin (MULTIVITAMIN) capsule Take 1 capsule by mouth daily.      Marland Kitchen omeprazole (PRILOSEC) 20 MG capsule Take 40 mg by mouth every morning.        . potassium chloride (K-DUR,KLOR-CON) 10 MEQ tablet TAKE 1 TABLET (10 MEQ TOTAL) BY MOUTH DAILY.  30 tablet  2  . DISCONTD: sildenafil (VIAGRA) 100 MG tablet Take 0.5-1 tablets (50-100 mg total) by mouth daily as needed for erectile dysfunction.  6 tablet  5  . metFORMIN (GLUCOPHAGE) 500 MG tablet Take 500 mg by mouth 2 (two) times daily with a meal.       . simvastatin (ZOCOR) 40 MG tablet Take 20 mg by mouth at bedtime.       Marland Kitchen DISCONTD: potassium chloride (K-DUR) 10 MEQ tablet Take 1 tablet (10 mEq total) by mouth daily.  30 tablet  1    BP 148/100  Pulse 84  Temp(Src) 98 F (36.7 C) (Oral)  Resp 16  Ht 6\' 1"  (1.854 m)  Wt 315 lb 1.9 oz (142.937 kg)  BMI 41.57 kg/m2  SpO2 98%       Objective:   Physical Exam Physical Exam  Constitutional: He is oriented to person, place, and time. He appears well-developed and well-nourished. No distress.  HENT:  Nose: skin tag noted- edge of left nare. Head: Normocephalic and atraumatic.  Right Ear: Tympanic membrane and ear canal normal.  Left Ear: Tympanic membrane and ear canal normal.  Mouth/Throat: Oropharynx is clear and moist.  Eyes: Pupils are equal, round, and reactive to light. No scleral icterus.  Neck: Normal range of motion. No thyromegaly present.  Cardiovascular: Normal rate and regular rhythm.   No murmur heard. Pulmonary/Chest: Effort normal and breath sounds normal. No respiratory distress. He has no wheezes. He has no rales. He exhibits no tenderness.  Abdominal: Soft. Bowel sounds are normal. He exhibits no distension and no mass. There is no tenderness. There is no rebound and no guarding.  Musculoskeletal: He exhibits no edema.    Lymphadenopathy:    He has no cervical adenopathy.  Neurological: He is alert and oriented to person, place, and time. He exhibits normal muscle tone. Coordination normal.  Skin: Skin is warm and dry. small skin tag is noted  on edge of left nare.  Lipoma noted under right arm above axilla.  Psychiatric: He has a normal mood and affect. His behavior is normal. Judgment and thought content normal.  GU:  Prostate is smooth without asymmetry, enlargement or palpable nodules.  Stool is Heme + today.        Assessment & Plan:          Assessment & Plan:

## 2011-07-29 ENCOUNTER — Telehealth: Payer: Self-pay | Admitting: Family

## 2011-07-29 ENCOUNTER — Ambulatory Visit (HOSPITAL_BASED_OUTPATIENT_CLINIC_OR_DEPARTMENT_OTHER)
Admission: RE | Admit: 2011-07-29 | Discharge: 2011-07-29 | Disposition: A | Discharge status: Home / Self Care | Source: Ambulatory Visit | Attending: Family | Admitting: Family

## 2011-07-29 DIAGNOSIS — E041 Nontoxic single thyroid nodule: Secondary | ICD-10-CM

## 2011-07-29 DIAGNOSIS — E049 Nontoxic goiter, unspecified: Secondary | ICD-10-CM | POA: Insufficient documentation

## 2011-07-29 DIAGNOSIS — M5030 Other cervical disc degeneration, unspecified cervical region: Secondary | ICD-10-CM | POA: Insufficient documentation

## 2011-07-29 DIAGNOSIS — M542 Cervicalgia: Secondary | ICD-10-CM | POA: Insufficient documentation

## 2011-07-29 DIAGNOSIS — M5090 Cervical disc disorder, unspecified, unspecified cervical region: Secondary | ICD-10-CM | POA: Insufficient documentation

## 2011-07-29 DIAGNOSIS — M62838 Other muscle spasm: Secondary | ICD-10-CM

## 2011-07-29 DIAGNOSIS — M2569 Stiffness of other specified joint, not elsewhere classified: Secondary | ICD-10-CM | POA: Insufficient documentation

## 2011-07-29 DIAGNOSIS — M4802 Spinal stenosis, cervical region: Secondary | ICD-10-CM | POA: Insufficient documentation

## 2011-07-29 LAB — HEPATIC FUNCTION PANEL
AST: 52 U/L — ABNORMAL HIGH (ref 0–37)
Albumin: 4.7 g/dL (ref 3.5–5.2)
Alkaline Phosphatase: 64 U/L (ref 39–117)
Indirect Bilirubin: 0.4 mg/dL (ref 0.0–0.9)
Total Bilirubin: 0.5 mg/dL (ref 0.3–1.2)

## 2011-07-29 NOTE — Telephone Encounter (Signed)
Pt notified and will have labs today. We will call him with u/s date and time.

## 2011-07-29 NOTE — Telephone Encounter (Signed)
Left message requesting that pt return our call.  When he returns call pls let him know that his LFT's are still elevated.  I would like for him to do some additional blood work and an abdominal ultrasound to further evaluate. Sometimes this is caused by a "fatty liver" and the ultrasound will help [place] to check that.

## 2011-07-30 ENCOUNTER — Ambulatory Visit: Admitting: Family

## 2011-07-30 ENCOUNTER — Telehealth: Payer: Self-pay | Admitting: Family

## 2011-07-30 DIAGNOSIS — E041 Nontoxic single thyroid nodule: Secondary | ICD-10-CM

## 2011-07-30 DIAGNOSIS — Z111 Encounter for screening for respiratory tuberculosis: Secondary | ICD-10-CM

## 2011-07-30 LAB — FERRITIN: Ferritin: 346 ng/mL — ABNORMAL HIGH (ref 22–322)

## 2011-07-30 LAB — IRON AND TIBC
%SAT: 18 % — ABNORMAL LOW (ref 20–55)
Iron: 48 ug/dL (ref 42–165)

## 2011-07-30 LAB — HEPATITIS C ANTIBODY: HCV Ab: NEGATIVE

## 2011-07-30 LAB — HEPATITIS B SURFACE ANTIGEN: Hepatitis B Surface Ag: NEGATIVE

## 2011-07-30 LAB — CERULOPLASMIN: Ceruloplasmin: 30 mg/dL (ref 20–60)

## 2011-07-30 LAB — HEPATITIS B SURFACE ANTIBODY,QUALITATIVE: Hep B S Ab: NEGATIVE

## 2011-07-30 NOTE — Progress Notes (Signed)
  Subjective:    Patient ID: Charles Nash, male    DOB: [birth year], 46 y.o.   MRN: [id number]  HPI  The patient presented to the office after 48 hours to check the injection site for positive or negative reaction.  Review of Systems     Objective:   Physical Exam  No firm bump forms at the test site and no redness noted.      Assessment & Plan:   Negative TB skin test. The patient was counseled to call if he experiences any irritation of site.    Mervin Kung, CMA(AAMA)

## 2011-07-30 NOTE — Telephone Encounter (Signed)
Left message for pt to return my call.

## 2011-07-30 NOTE — Telephone Encounter (Signed)
Pt called wanting to know if he should continue his potassium supplement since he is taking losartan potassium?  Please advise.

## 2011-07-30 NOTE — Telephone Encounter (Signed)
Notified pt. 

## 2011-07-30 NOTE — Telephone Encounter (Signed)
Spoke with pt.  Reviewed plans to

## 2011-07-30 NOTE — Telephone Encounter (Addendum)
Yes- he should continue potassium supplement.   Addendum- spoke with pt earlier re: thyroid nodule on CT of C spine and need to complete thyroid ultrasound.  He wishes to arrange this after his abdominal US next week.  I spoke with imaging and made this arrangement.

## 2011-07-31 ENCOUNTER — Encounter (HOSPITAL_BASED_OUTPATIENT_CLINIC_OR_DEPARTMENT_OTHER): Payer: Self-pay | Admitting: *Deleted

## 2011-07-31 ENCOUNTER — Telehealth: Payer: Self-pay | Admitting: Family

## 2011-07-31 ENCOUNTER — Emergency Department (HOSPITAL_BASED_OUTPATIENT_CLINIC_OR_DEPARTMENT_OTHER)
Admission: EM | Admit: 2011-07-31 | Discharge: 2011-07-31 | Disposition: A | Discharge status: Home / Self Care | Attending: Emergency Medicine | Admitting: Emergency Medicine

## 2011-07-31 ENCOUNTER — Emergency Department (INDEPENDENT_AMBULATORY_CARE_PROVIDER_SITE_OTHER)

## 2011-07-31 DIAGNOSIS — IMO0001 Reserved for inherently not codable concepts without codable children: Secondary | ICD-10-CM | POA: Insufficient documentation

## 2011-07-31 DIAGNOSIS — G4733 Obstructive sleep apnea (adult) (pediatric): Secondary | ICD-10-CM | POA: Insufficient documentation

## 2011-07-31 DIAGNOSIS — M5030 Other cervical disc degeneration, unspecified cervical region: Secondary | ICD-10-CM | POA: Insufficient documentation

## 2011-07-31 DIAGNOSIS — E042 Nontoxic multinodular goiter: Secondary | ICD-10-CM | POA: Insufficient documentation

## 2011-07-31 DIAGNOSIS — Z79899 Other long term (current) drug therapy: Secondary | ICD-10-CM | POA: Insufficient documentation

## 2011-07-31 DIAGNOSIS — K219 Gastro-esophageal reflux disease without esophagitis: Secondary | ICD-10-CM | POA: Insufficient documentation

## 2011-07-31 DIAGNOSIS — E785 Hyperlipidemia, unspecified: Secondary | ICD-10-CM | POA: Insufficient documentation

## 2011-07-31 DIAGNOSIS — M791 Myalgia, unspecified site: Secondary | ICD-10-CM

## 2011-07-31 DIAGNOSIS — R509 Fever, unspecified: Secondary | ICD-10-CM | POA: Insufficient documentation

## 2011-07-31 DIAGNOSIS — I10 Essential (primary) hypertension: Secondary | ICD-10-CM | POA: Insufficient documentation

## 2011-07-31 DIAGNOSIS — E119 Type 2 diabetes mellitus without complications: Secondary | ICD-10-CM | POA: Insufficient documentation

## 2011-07-31 DIAGNOSIS — R52 Pain, unspecified: Secondary | ICD-10-CM

## 2011-07-31 LAB — DIFFERENTIAL
Basophils Absolute: 0 10*3/uL (ref 0.0–0.1)
Basophils Relative: 0 % (ref 0–1)
Eosinophils Relative: 0 % (ref 0–5)
Monocytes Absolute: 0.4 10*3/uL (ref 0.1–1.0)
Monocytes Relative: 13 % — ABNORMAL HIGH (ref 3–12)
Neutro Abs: 1.6 10*3/uL — ABNORMAL LOW (ref 1.7–7.7)

## 2011-07-31 LAB — CBC
HCT: 43.1 % (ref 39.0–52.0)
Hemoglobin: 15 g/dL (ref 13.0–17.0)
MCH: 29.7 pg (ref 26.0–34.0)
MCHC: 34.8 g/dL (ref 30.0–36.0)
MCV: 85.3 fL (ref 78.0–100.0)
RDW: 13.2 % (ref 11.5–15.5)

## 2011-07-31 LAB — COMPREHENSIVE METABOLIC PANEL
Albumin: 4.3 g/dL (ref 3.5–5.2)
BUN: 13 mg/dL (ref 6–23)
Calcium: 9.4 mg/dL (ref 8.4–10.5)
Chloride: 100 mEq/L (ref 96–112)
Creatinine, Ser: 1.2 mg/dL (ref 0.50–1.35)
GFR calc non Af Amer: 72 mL/min — ABNORMAL LOW (ref >90)
Total Bilirubin: 0.5 mg/dL (ref 0.3–1.2)

## 2011-07-31 MED ORDER — LORAZEPAM 1 MG PO TABS
1.0000 mg | ORAL_TABLET | Freq: Once | ORAL | Status: AC
  Administered 2011-07-31: 1 mg via ORAL
  Filled 2011-07-31: qty 1

## 2011-07-31 MED ORDER — IBUPROFEN 800 MG PO TABS
800.0000 mg | ORAL_TABLET | Freq: Once | ORAL | Status: AC
  Administered 2011-07-31: 800 mg via ORAL
  Filled 2011-07-31: qty 1

## 2011-07-31 MED ORDER — LORAZEPAM 1 MG PO TABS: 1.0000 mg | ORAL_TABLET | Freq: Every evening | ORAL | Status: AC | PRN

## 2011-07-31 NOTE — ED Provider Notes (Signed)
History     CSN: [id number]  Arrival date & time 07/31/11  1816   First MD Initiated Contact with Patient 07/31/11 1913      Chief Complaint  Patient presents with  . Generalized Body Aches  . Fever    (Consider location/radiation/quality/duration/timing/severity/associated sxs/prior treatment) Patient is a 46 y.o. male presenting with weakness. The history is provided by the patient. No language interpreter was used.  Weakness The primary symptoms include headaches. Primary symptoms do not include focal weakness or fever. The symptoms began 2 days ago. The symptoms are unchanged. The neurological symptoms are diffuse.  The headache is associated with weakness.  Additional symptoms include weakness. Medical issues also include diabetes.    Past Medical History  Diagnosis Date  . Hyperlipidemia   . Diabetes mellitus   . Hypertension   . OSA (obstructive sleep apnea)   . Arthritis   . Allergy   . Personal history of colonic polyps   . GERD (gastroesophageal reflux disease)     Past Surgical History  Procedure Date  . Knee surgery 08/04/08    Right knee-- medial meniscus repair, attenuation anterior cruciate [name] MD [facility])   . Ankle surgery   . Foot surgery   . Shoulder surgery     Family History  Problem Relation Age of Onset  . Diabetes Mother   . Hypertension Mother   . Arthritis Mother   . Cancer Mother     uterine and breast cancer  . Hyperlipidemia Father   . Arthritis Father   . Cancer Father     prostate cancer  . Hyperlipidemia Maternal Grandmother   . Hypertension Maternal Grandmother   . Hyperlipidemia Maternal Grandfather   . Hypertension Maternal Grandfather   . Hyperlipidemia Paternal Grandmother   . Hypertension Paternal Grandmother   . Hyperlipidemia Paternal Grandfather   . Hypertension Paternal Grandfather   . Heart attack Paternal Grandfather   . Sudden death Neg Hx     History  Substance Use Topics  . Smoking  status: Never Smoker   . Smokeless tobacco: Never Used  . Alcohol Use: No     occasional wine      Review of Systems  Constitutional: Negative for fever.  Musculoskeletal: Positive for myalgias.  Neurological: Positive for weakness and headaches. Negative for focal weakness.  All other systems reviewed and are negative.    Allergies  Tylox  Home Medications   Current Outpatient Rx  Name Route Sig Dispense Refill  . AMLODIPINE BESYLATE 10 MG PO TABS Oral Take 10 mg by mouth daily.      . ASPIRIN 81 MG PO TABS Oral Take 81 mg by mouth daily.      Marland Kitchen CLOMIPHENE CITRATE 50 MG PO TABS Oral Take 50 mg by mouth daily.    Marland Kitchen BYETTA 5 MCG PEN [place] Subcutaneous Inject 5 mg into the skin 2 (two) times daily.    Marland Kitchen HYDROCHLOROTHIAZIDE 25 MG PO TABS Oral Take 25 mg by mouth daily.     . IBUPROFEN 800 MG PO TABS Oral Take 800 mg by mouth every 8 (eight) hours as needed. Patient used this medication for body aches.    Marland Kitchen LORATADINE 10 MG PO TABS Oral Take 10 mg by mouth daily.    Marland Kitchen LOSARTAN POTASSIUM 100 MG PO TABS Oral Take 1 tablet (100 mg total) by mouth daily. 30 tablet 3  . METOPROLOL SUCCINATE ER 50 MG PO TB24 Oral Take 50 mg by mouth daily.  Take with or immediately following a meal.    . MULTIVITAMINS PO CAPS Oral Take 1 capsule by mouth daily.    Marland Kitchen OMEPRAZOLE 20 MG PO CPDR Oral Take 40 mg by mouth every morning.      . FENOFIBRATE PO Oral Take 1 tablet by mouth at bedtime.    Marland Kitchen FLUTICASONE PROPIONATE 50 MCG/ACT NA SUSP Nasal Place 2 sprays into the nose daily. 16 g 6  . POTASSIUM CHLORIDE CRYS ER 10 MEQ PO TBCR  TAKE 1 TABLET (10 MEQ TOTAL) BY MOUTH DAILY. 30 tablet 2  . SILDENAFIL CITRATE 100 MG PO TABS Oral Take 50-100 mg by mouth daily as needed.      BP 149/83  Pulse 95  Temp(Src) 100.1 F (37.8 C) (Oral)  Resp 20  Ht 6\' 1"  (1.854 m)  Wt 314 lb (142.429 kg)  BMI 41.43 kg/m2  SpO2 98%  Physical Exam  Vitals reviewed. Constitutional: He is oriented to person, place, and time.  He appears well-developed and well-nourished.  HENT:  Head: Normocephalic and atraumatic.  Right Ear: External ear normal.  Left Ear: External ear normal.  Nose: Nose normal.  Mouth/Throat: Oropharynx is clear and moist.  Eyes: Conjunctivae and EOM are normal. Pupils are equal, round, and reactive to light.  Neck: Normal range of motion. Neck supple.  Cardiovascular: Normal rate and normal heart sounds.   Pulmonary/Chest: Effort normal and breath sounds normal.  Abdominal: Soft.  Musculoskeletal: Normal range of motion.  Neurological: He is alert and oriented to person, place, and time.  Skin: Skin is warm.  Psychiatric: He has a normal mood and affect.    ED Course  Procedures (including critical care time)  Labs Reviewed  CBC - Abnormal; Notable for the following:    WBC 3.3 (*)    All other components within normal limits  DIFFERENTIAL - Abnormal; Notable for the following:    Neutro Abs 1.6 (*)    Monocytes Relative 13 (*)    All other components within normal limits  COMPREHENSIVE METABOLIC PANEL - Abnormal; Notable for the following:    Potassium 3.3 (*)    AST 45 (*)    ALT 73 (*)    GFR calc non Af Amer 72 (*)    GFR calc Af Amer 83 (*)    All other components within normal limits   Dg Chest 2 View  07/31/2011  *RADIOLOGY REPORT*  Clinical Data: Body aches.  CHEST - 2 VIEW  Comparison: PA and lateral chest 05/18/2011.  Findings: Lungs are clear.  Heart size is normal.  No pneumothorax or pleural fluid.  IMPRESSION: Negative chest.  Original Report Authenticated By: Bernadene Bell. Maricela Curet, M.D.     No diagnosis found.    MDM   Results for orders placed during the hospital encounter of 07/31/11  CBC      Component Value Range   WBC 3.3 (*) 4.0 - 10.5 (K/uL)   RBC 5.05  4.22 - 5.81 (MIL/uL)   Hemoglobin 15.0  13.0 - 17.0 (g/dL)   HCT 36.6  44.0 - 34.7 (%)   MCV 85.3  78.0 - 100.0 (fL)   MCH 29.7  26.0 - 34.0 (pg)   MCHC 34.8  30.0 - 36.0 (g/dL)   RDW 42.5   95.6 - 38.7 (%)   Platelets 253  150 - 400 (K/uL)  DIFFERENTIAL      Component Value Range   Neutrophils Relative 48  43 - 77 (%)   Neutro Abs  1.6 (*) 1.7 - 7.7 (K/uL)   Lymphocytes Relative 39  12 - 46 (%)   Lymphs Abs 1.3  0.7 - 4.0 (K/uL)   Monocytes Relative 13 (*) 3 - 12 (%)   Monocytes Absolute 0.4  0.1 - 1.0 (K/uL)   Eosinophils Relative 0  0 - 5 (%)   Eosinophils Absolute 0.0  0.0 - 0.7 (K/uL)   Basophils Relative 0  0 - 1 (%)   Basophils Absolute 0.0  0.0 - 0.1 (K/uL)  COMPREHENSIVE METABOLIC PANEL      Component Value Range   Sodium 138  135 - 145 (mEq/L)   Potassium 3.3 (*) 3.5 - 5.1 (mEq/L)   Chloride 100  96 - 112 (mEq/L)   CO2 26  19 - 32 (mEq/L)   Glucose, Bld 85  70 - 99 (mg/dL)   BUN 13  6 - 23 (mg/dL)   Creatinine, Ser 2.44  0.50 - 1.35 (mg/dL)   Calcium 9.4  8.4 - 01.0 (mg/dL)   Total Protein 8.0  6.0 - 8.3 (g/dL)   Albumin 4.3  3.5 - 5.2 (g/dL)   AST 45 (*) 0 - 37 (U/L)   ALT 73 (*) 0 - 53 (U/L)   Alkaline Phosphatase 61  39 - 117 (U/L)   Total Bilirubin 0.5  0.3 - 1.2 (mg/dL)   GFR calc non Af Amer 72 (*) >90 (mL/min)   GFR calc Af Amer 83 (*) >90 (mL/min)   Dg Chest 2 View  07/31/2011  *RADIOLOGY REPORT*  Clinical Data: Body aches.  CHEST - 2 VIEW  Comparison: PA and lateral chest 05/18/2011.  Findings: Lungs are clear.  Heart size is normal.  No pneumothorax or pleural fluid.  IMPRESSION: Negative chest.  Original Report Authenticated By: Bernadene Bell. Maricela Curet, M.D.   Mr Cervical Spine Wo Contrast  07/29/2011  *RADIOLOGY REPORT*  Clinical Data: Increasing neck pain and decreased mobility in the neck.  Neck and back spasms.  MRI CERVICAL SPINE WITHOUT CONTRAST  Technique:  Multiplanar and multiecho pulse sequences of the cervical spine, to include the craniocervical junction and cervicothoracic junction, were obtained according to standard protocol without intravenous contrast.  Comparison: None.  Findings: The scan extends from the top of the clivus through  T1-2. The visualized intracranial contents are normal.  Cervical spinal cord demonstrates no mass lesion or myelopathy or significant spinal cord compression.  C1-2:  Normal.  C2-3:  Marked degenerative disc disease with edema in the endplates.  Tiny broad-based disc bulge with accompanying osteophytes without neural impingement.  C3-4:  Marked disc space narrowing with osteophytes extending into both neural foramina with mild bilateral foraminal narrowing.  C4-5:  Diffuse disc space narrowing.  Bilateral uncinate spurring without neural impingement.  C5-6:  Disc space narrowing with slight uncinate spurring to the right and left.  C6-7:  Small broad-based disc bulge with osteophytes asymmetric to the left narrowing the left lateral recess and left neural foramen.  C7-T1:  Disc space narrowing with prominent osteophytes into the right neural foramen with slight right foraminal stenosis.  T1-2:  No significant abnormality.  There is no significant facet joint disease in the cervical spine.  Paraspinal soft tissues demonstrate multiple nodules in the thyroid gland including a mixed solid and cystic 4.2 cm nodule in the right lobe.  If these have not been previously evaluated, thyroid ultrasound is recommended for further assessment.  IMPRESSION:  1.  Diffuse degenerative disc disease at each level from C2-3 through C7-T1.  Active  inflammatory changes of the endplates at C2- 3. 2.  Mild foraminal stenosis bilaterally at C3-4, on the left at C6- 7 and on the right at C7-T1. 3.  No focal neural impingement. 4.  Multiple thyroid nodules with a dominant nodule on the right. If these have not been previously assessed, thyroid ultrasound would be useful for further evaluation.  Original Report Authenticated By: Gwynn Burly, M.D.     Pt given ibuprofen po.  Labs and chest xray normal.   I advised pt to recheck with his Md on Monday.  Pt to return if any problems.      Lonia Skinner Mathis, [place] 07/31/11 2143

## 2011-07-31 NOTE — Telephone Encounter (Signed)
Patient states that he was hot/freezing last night. He felt he had a small fever. He also had diarrhea/nausea. All symptoms went away this morning but now he states that symptoms are back. He wanted to let [facility] know.

## 2011-07-31 NOTE — ED Notes (Signed)
Pt reported that yesterday he began having generalized body aches with nausea and fever.

## 2011-07-31 NOTE — Discharge Instructions (Signed)
Viral Infections  A viral infection can be caused by different types of viruses.Most viral infections are not serious and resolve on their own. However, some infections may cause severe symptoms and may lead to further complications.  SYMPTOMS  Viruses can frequently cause:   Minor sore throat.   Aches and pains.   Headaches.   Runny nose.   Different types of rashes.   Watery eyes.   Tiredness.   Cough.   Loss of appetite.   Gastrointestinal infections, resulting in nausea, vomiting, and diarrhea.  These symptoms do not respond to antibiotics because the infection is not caused by bacteria. However, you might catch a bacterial infection following the viral infection. This is sometimes called a "superinfection." Symptoms of such a bacterial infection may include:   Worsening sore throat with pus and difficulty swallowing.   Swollen neck glands.   Chills and a high or persistent fever.   Severe headache.   Tenderness over the sinuses.   Persistent overall ill feeling (malaise), muscle aches, and tiredness (fatigue).   Persistent cough.   Yellow, green, or brown mucus production with coughing.  HOME CARE INSTRUCTIONS    Only take over-the-counter or prescription medicines for pain, discomfort, diarrhea, or fever as directed by your caregiver.   Drink enough water and fluids to keep your urine clear or pale yellow. Sports drinks can provide valuable electrolytes, sugars, and hydration.   Get plenty of rest and maintain proper nutrition. Soups and broths with crackers or rice are fine.  SEEK IMMEDIATE MEDICAL CARE IF:    You have severe headaches, shortness of breath, chest pain, neck pain, or an unusual rash.   You have uncontrolled vomiting, diarrhea, or you are unable to keep down fluids.   You or your child has an oral temperature above 102 F (38.9 C), not controlled by medicine.   Your baby is older than 3 months with a rectal temperature of 102 F (38.9 C) or higher.   Your baby is 3  months old or younger with a rectal temperature of 100.4 F (38 C) or higher.  MAKE SURE YOU:    Understand these instructions.   Will watch your condition.   Will get help right away if you are not doing well or get worse.  Document Released: 01/08/2005 Document Revised: 03/20/2011 Document Reviewed: 08/05/2010  Exit[employer] Patient Information 2012 [employer].

## 2011-08-01 ENCOUNTER — Encounter

## 2011-08-01 NOTE — Telephone Encounter (Signed)
Pls disregard message below as I see that pt was evaluated in ED yesterday.

## 2011-08-01 NOTE — Telephone Encounter (Signed)
Make sure that he is drinking enough fluids. May use imodium prn diarrhea. Should be seen in office if fever over 101, abdominal pain, or if unable to keep down fluids.

## 2011-08-02 NOTE — ED Provider Notes (Signed)
Medical screening examination/treatment/procedure(s) were performed by non-physician practitioner and as supervising physician I was immediately available for consultation/collaboration.  Zakhi Dupre, MD 08/02/11 0742 

## 2011-08-04 ENCOUNTER — Other Ambulatory Visit (HOSPITAL_BASED_OUTPATIENT_CLINIC_OR_DEPARTMENT_OTHER)

## 2011-08-04 ENCOUNTER — Ambulatory Visit (AMBULATORY_SURGERY_CENTER): Admitting: *Deleted

## 2011-08-04 VITALS — Ht 73.0 in | Wt 314.1 lb

## 2011-08-04 DIAGNOSIS — Z86010 Personal history of colonic polyps: Secondary | ICD-10-CM

## 2011-08-04 DIAGNOSIS — K921 Melena: Secondary | ICD-10-CM

## 2011-08-04 MED ORDER — PEG-KCL-NACL-NASULF-NA ASC-C 100 G PO SOLR: 1.0000 | Freq: Once | ORAL | Status: DC

## 2011-08-04 NOTE — Progress Notes (Signed)
Pt here for previsit 08-04-2011, stated he had colonoscopy approximately 5 years ago with [facility]. I neglected to have patient sign records release and have called and asked him to return to office to sign. He states he will be here Wednesday morning, 08-06-11 to sign. He understands that records will be requested and Dr. Russella Dar will need to approve him having a colonoscopy after reviewing records from [facility]. Once records request signed, please give to CMA to request records for review by Dr. Russella Dar.

## 2011-08-05 ENCOUNTER — Telehealth: Payer: Self-pay | Admitting: *Deleted

## 2011-08-05 MED ORDER — METFORMIN HCL 500 MG PO TABS: 500.0000 mg | ORAL_TABLET | Freq: Two times a day (BID) | ORAL | Status: AC

## 2011-08-05 NOTE — Telephone Encounter (Signed)
I do not think that his thyroid nodules are a result of Byetta.  However, they are doing some studies to see if there may be an increased risk of thyroid cancer in pt's who use Byetta and other similar medications.  There is no proof at this point.  I am happy to switch him to metformin BID instead of Byetta.  We'll see how his sugars do with this switch.  Rx sent to pharmacy.

## 2011-08-05 NOTE — Telephone Encounter (Signed)
Notified pt per instructions below. He states he will complete what he has left of Byetta and then change to Metformin. Pt also wants to know if we are going to refer him to a neurologist for his neck. Please advise.

## 2011-08-05 NOTE — Telephone Encounter (Signed)
Pt called stating he has read that Byetta can cause problems with your thyroid.  Pt concerned because of his new development on thyroid nodules. States he stopped taking it yesterday and wants to know if you would like him to restart Metformin.  Please advise.

## 2011-08-06 ENCOUNTER — Telehealth: Payer: Self-pay | Admitting: *Deleted

## 2011-08-06 ENCOUNTER — Other Ambulatory Visit (HOSPITAL_BASED_OUTPATIENT_CLINIC_OR_DEPARTMENT_OTHER)

## 2011-08-06 ENCOUNTER — Telehealth: Payer: Self-pay | Admitting: Family

## 2011-08-06 ENCOUNTER — Ambulatory Visit (HOSPITAL_BASED_OUTPATIENT_CLINIC_OR_DEPARTMENT_OTHER)
Admission: RE | Admit: 2011-08-06 | Discharge: 2011-08-06 | Disposition: A | Discharge status: Home / Self Care | Source: Ambulatory Visit | Attending: Family | Admitting: Family

## 2011-08-06 ENCOUNTER — Encounter: Payer: Self-pay | Admitting: Family

## 2011-08-06 ENCOUNTER — Ambulatory Visit (INDEPENDENT_AMBULATORY_CARE_PROVIDER_SITE_OTHER)
Admission: RE | Admit: 2011-08-06 | Discharge: 2011-08-06 | Disposition: A | Discharge status: Home / Self Care | Source: Ambulatory Visit | Attending: Family | Admitting: Family

## 2011-08-06 DIAGNOSIS — K760 Fatty (change of) liver, not elsewhere classified: Secondary | ICD-10-CM

## 2011-08-06 DIAGNOSIS — K7689 Other specified diseases of liver: Secondary | ICD-10-CM | POA: Insufficient documentation

## 2011-08-06 DIAGNOSIS — R590 Localized enlarged lymph nodes: Secondary | ICD-10-CM

## 2011-08-06 DIAGNOSIS — IMO0002 Reserved for concepts with insufficient information to code with codable children: Secondary | ICD-10-CM

## 2011-08-06 DIAGNOSIS — E041 Nontoxic single thyroid nodule: Secondary | ICD-10-CM

## 2011-08-06 DIAGNOSIS — E042 Nontoxic multinodular goiter: Secondary | ICD-10-CM

## 2011-08-06 DIAGNOSIS — E0789 Other specified disorders of thyroid: Secondary | ICD-10-CM

## 2011-08-06 DIAGNOSIS — G4733 Obstructive sleep apnea (adult) (pediatric): Secondary | ICD-10-CM | POA: Insufficient documentation

## 2011-08-06 DIAGNOSIS — E119 Type 2 diabetes mellitus without complications: Secondary | ICD-10-CM | POA: Insufficient documentation

## 2011-08-06 DIAGNOSIS — E785 Hyperlipidemia, unspecified: Secondary | ICD-10-CM | POA: Insufficient documentation

## 2011-08-06 DIAGNOSIS — R7989 Other specified abnormal findings of blood chemistry: Secondary | ICD-10-CM | POA: Insufficient documentation

## 2011-08-06 DIAGNOSIS — E669 Obesity, unspecified: Secondary | ICD-10-CM | POA: Insufficient documentation

## 2011-08-06 HISTORY — DX: Fatty (change of) liver, not elsewhere classified: K76.0

## 2011-08-06 NOTE — Assessment & Plan Note (Signed)
Mild elevation of ferritin, doubt clinical significance. Plan to repeat in 3 months.

## 2011-08-06 NOTE — Telephone Encounter (Signed)
Reviewed abdominal US- fatty liver.  Recommended to patient low fat diet, exercise and weight loss.  Reviewed thyroid US- cervical LAD and large thyroid nodules.  Will refer to ENT Dr. Emeline Darling.  Pt aware.

## 2011-08-06 NOTE — Telephone Encounter (Signed)
Pt called stating he needs a letter from [place] regarding his inability to maintain a full time job. Pt is applying for 100% disability due to current medical conditions and hx of post traumatic syndrome disorder. He will turn letter in to the doctor at the [place] to include with their paperwork.   Also needs letter addressed to [employer], Attn: [employer] Pt reports that he had to withdraw from classes this semester as he was falling behind due to his medical conditions and he was unable to keep up. They are requiring him to submit letter of verification. Please advise.

## 2011-08-06 NOTE — Assessment & Plan Note (Signed)
Likely due to fatty liver.

## 2011-08-07 ENCOUNTER — Encounter: Payer: Self-pay | Admitting: Family

## 2011-08-07 NOTE — Telephone Encounter (Signed)
See letter.

## 2011-08-08 ENCOUNTER — Encounter: Payer: Self-pay | Admitting: Family

## 2011-08-08 NOTE — Telephone Encounter (Signed)
Pt notified, letters placed at front desk for pick up.

## 2011-08-08 NOTE — Telephone Encounter (Signed)
Is there a different letter to give to his school?

## 2011-08-08 NOTE — Telephone Encounter (Signed)
See letter.

## 2011-08-12 ENCOUNTER — Telehealth: Payer: Self-pay | Admitting: Gastroenterology

## 2011-08-12 NOTE — Telephone Encounter (Signed)
Movi prep #1 Rx called to [facility] on [address] Rondall Allegra.    No substitute/no refills

## 2011-08-14 ENCOUNTER — Telehealth: Payer: Self-pay | Admitting: Gastroenterology

## 2011-08-14 NOTE — Telephone Encounter (Signed)
Returned pts call.  Pt states that he takes Byetta shots for his diabetes.  I advised him to hold his Byetta tomorrow morning.  He will also hold his potassium and HCTZ.  Pt verbalized understanding.

## 2011-08-15 ENCOUNTER — Ambulatory Visit (AMBULATORY_SURGERY_CENTER): Admitting: Gastroenterology

## 2011-08-15 ENCOUNTER — Other Ambulatory Visit (HOSPITAL_COMMUNITY): Payer: Self-pay | Admitting: Otolaryngology

## 2011-08-15 ENCOUNTER — Encounter: Payer: Self-pay | Admitting: Gastroenterology

## 2011-08-15 ENCOUNTER — Encounter: Admitting: Gastroenterology

## 2011-08-15 VITALS — BP 139/99 | HR 88 | Temp 96.0°F | Resp 19 | Ht 73.0 in | Wt 314.0 lb

## 2011-08-15 DIAGNOSIS — K921 Melena: Secondary | ICD-10-CM

## 2011-08-15 DIAGNOSIS — E040 Nontoxic diffuse goiter: Secondary | ICD-10-CM

## 2011-08-15 DIAGNOSIS — Z86010 Personal history of colonic polyps: Secondary | ICD-10-CM

## 2011-08-15 DIAGNOSIS — D497 Neoplasm of unspecified behavior of endocrine glands and other parts of nervous system: Secondary | ICD-10-CM

## 2011-08-15 DIAGNOSIS — R599 Enlarged lymph nodes, unspecified: Secondary | ICD-10-CM

## 2011-08-15 DIAGNOSIS — Z1211 Encounter for screening for malignant neoplasm of colon: Secondary | ICD-10-CM

## 2011-08-15 MED ORDER — SODIUM CHLORIDE 0.9 % IV SOLN: 500.0000 mL | INTRAVENOUS | Status: DC

## 2011-08-15 NOTE — Patient Instructions (Signed)
Discharge instructions given with verbal understanding. Handouts on hemorrhoids and a high fiber diet given. Resume previous medications. YOU HAD AN ENDOSCOPIC PROCEDURE TODAY AT THE [facility]: Refer to the procedure report that was given to you for any specific questions about what was found during the examination.  If the procedure report does not answer your questions, please call your gastroenterologist to clarify.  If you requested that your care partner not be given the details of your procedure findings, then the procedure report has been included in a sealed envelope for you to review at your convenience later.  YOU SHOULD EXPECT: Some feelings of bloating in the abdomen. Passage of more gas than usual.  Walking can help get rid of the air that was put into your GI tract during the procedure and reduce the bloating. If you had a lower endoscopy (such as a colonoscopy or flexible sigmoidoscopy) you may notice spotting of blood in your stool or on the toilet paper. If you underwent a bowel prep for your procedure, then you may not have a normal bowel movement for a few days.  DIET: Your first meal following the procedure should be a light meal and then it is ok to progress to your normal diet.  A half-sandwich or bowl of soup is an example of a good first meal.  Heavy or fried foods are harder to digest and may make you feel nauseous or bloated.  Likewise meals heavy in dairy and vegetables can cause extra gas to form and this can also increase the bloating.  Drink plenty of fluids but you should avoid alcoholic beverages for 24 hours.  ACTIVITY: Your care partner should take you home directly after the procedure.  You should plan to take it easy, moving slowly for the rest of the day.  You can resume normal activity the day after the procedure however you should NOT DRIVE or use heavy machinery for 24 hours (because of the sedation medicines used during the test).    SYMPTOMS TO  REPORT IMMEDIATELY: A gastroenterologist can be reached at any hour.  During normal business hours, 8:30 AM to 5:00 PM Monday through Friday, call [phone].  After hours and on weekends, please call the GI answering service at [phone] who will take a message and have the physician on call contact you.   Following lower endoscopy (colonoscopy or flexible sigmoidoscopy):  Excessive amounts of blood in the stool  Significant tenderness or worsening of abdominal pains  Swelling of the abdomen that is new, acute  Fever of 100F or higher FOLLOW UP: If any biopsies were taken you will be contacted by phone or by letter within the next 1-3 weeks.  Call your gastroenterologist if you have not heard about the biopsies in 3 weeks.  Our staff will call the home number listed on your records the next business day following your procedure to check on you and address any questions or concerns that you may have at that time regarding the information given to you following your procedure. This is a courtesy call and so if there is no answer at the home number and we have not heard from you through the emergency physician on call, we will assume that you have returned to your regular daily activities without incident.  SIGNATURES/CONFIDENTIALITY: You and/or your care partner have signed paperwork which will be entered into your electronic medical record.  These signatures attest to the fact that that the information above on your   After Visit Summary has been reviewed and is understood.  Full responsibility of the confidentiality of this discharge information lies with you and/or your care-partner.  

## 2011-08-15 NOTE — Op Note (Signed)
[facility] [address] [place], [place]  [address]  COLONOSCOPY PROCEDURE REPORT  PATIENT:  Charles, Nash  MR#:  [id number] BIRTHDATE:  [birth year], 46 yrs. old  GENDER:  male ENDOSCOPIST:  Judie Petit T. Russella Dar, MD, [facility]  PROCEDURE DATE:  08/15/2011 PROCEDURE:  Colonoscopy [address]147 ASA CLASS:  Class II INDICATIONS:  1) history of pre-cancerous (adenomatous) colon polyps: 2008  2) heme positive stool MEDICATIONS:   These medications were titrated to patient response per physician's verbal order, Fentanyl 75 mcg IV, Versed 9 mg IV DESCRIPTION OF PROCEDURE:   After the risks benefits and alternatives of the procedure were thoroughly explained, informed consent was obtained.  Digital rectal exam was performed and revealed no abnormalities.   The LB [id number] endoscope was introduced through the anus and advanced to the cecum, which was identified by both the appendix and ileocecal valve, without limitations.  The quality of the prep was good, using MoviPrep. The instrument was then slowly withdrawn as the colon was fully examined. <<PROCEDUREIMAGES>> FINDINGS:  A normal appearing cecum, ileocecal valve, and appendiceal orifice were identified. The ascending, hepatic flexure, transverse, splenic flexure, descending, sigmoid colon, and rectum appeared unremarkable.   Retroflexed views in the rectum revealed internal hemorrhoids, small.  The time to cecum = 1  minutes. The scope was then withdrawn (time =  8.25  min) from the patient and the procedure completed.  COMPLICATIONS:  None  ENDOSCOPIC IMPRESSION: 1) Normal colon 2) Internal hemorrhoids  RECOMMENDATIONS: 1) Repeat Colonoscopy in 5 years.  Charles Lick. Russella Dar, MD, [name]  n. eSIGNED:   Venita Lick. Kasim Mccorkle at 08/15/2011 08:54 AM  [name], [phone]

## 2011-08-15 NOTE — Progress Notes (Signed)
Patient did not experience any of the following events: a burn prior to discharge; a fall within the facility; wrong site/side/patient/procedure/implant event; or a hospital transfer or hospital admission upon discharge from the facility. (G8907) Patient did not have preoperative order for IV antibiotic SSI prophylaxis. (G8918)  

## 2011-08-18 ENCOUNTER — Telehealth: Payer: Self-pay | Admitting: *Deleted

## 2011-08-18 NOTE — Telephone Encounter (Signed)
  Follow up Call-  Call back number 08/15/2011  Post procedure Call Back phone  # [phone]  Permission to leave phone message Yes     Patient questions:  Do you have a fever, pain , or abdominal swelling? no Pain Score  0 *  Have you tolerated food without any problems? yes  Have you been able to return to your normal activities? yes  Do you have any questions about your discharge instructions: Diet   no Medications  no Follow up visit  no  Do you have questions or concerns about your Care? no  Actions: * If pain score is 4 or above: No action needed, pain <4.

## 2011-08-19 ENCOUNTER — Other Ambulatory Visit: Payer: Self-pay | Admitting: Radiology

## 2011-08-21 ENCOUNTER — Ambulatory Visit (HOSPITAL_COMMUNITY)
Admission: RE | Admit: 2011-08-21 | Discharge: 2011-08-21 | Disposition: A | Discharge status: Home / Self Care | Source: Ambulatory Visit | Attending: Otolaryngology | Admitting: Otolaryngology

## 2011-08-21 DIAGNOSIS — E040 Nontoxic diffuse goiter: Secondary | ICD-10-CM

## 2011-08-21 DIAGNOSIS — D497 Neoplasm of unspecified behavior of endocrine glands and other parts of nervous system: Secondary | ICD-10-CM

## 2011-08-21 DIAGNOSIS — E042 Nontoxic multinodular goiter: Secondary | ICD-10-CM | POA: Insufficient documentation

## 2011-08-21 DIAGNOSIS — R599 Enlarged lymph nodes, unspecified: Secondary | ICD-10-CM

## 2011-08-21 NOTE — Procedures (Signed)
US guided FNA performed of right lower pole thyroid cystic nodule x3 via 25 gauge needles. No immediate complications. Path pending.

## 2011-09-03 ENCOUNTER — Telehealth: Payer: Self-pay | Admitting: Family

## 2011-09-03 NOTE — Telephone Encounter (Signed)
Please advise if there are any directions you would like me to relay to pt.

## 2011-09-03 NOTE — Telephone Encounter (Signed)
Notified pt and he voices understanding. He states he has been drinking gatorade. Was able to keep down light breakfast and lunch. Overall states he is feeling better today.

## 2011-09-03 NOTE — Telephone Encounter (Signed)
Patient states that he had diarrhea and was vomiting all day yesterday. The only thing he had to drink was ginger ale and gatorade. He states that he did have a light breakfast this morning and has kept it down. He says that he still feels weak this morning. He wanted to know if there was a stomach virus going around.

## 2011-09-03 NOTE — Telephone Encounter (Signed)
Could be viral.  If he is unable to keep down food/drink he should go to ER.  If symptoms do not continue to improve, should be seen in office.  He should drink plenty of fluid.

## 2011-09-22 ENCOUNTER — Encounter: Payer: Self-pay | Admitting: Family

## 2011-11-06 ENCOUNTER — Encounter: Payer: Self-pay | Admitting: Family

## 2011-11-06 ENCOUNTER — Ambulatory Visit (INDEPENDENT_AMBULATORY_CARE_PROVIDER_SITE_OTHER): Admitting: Family

## 2011-11-06 VITALS — BP 142/98 | HR 92 | Temp 97.9°F | Resp 18 | Ht 73.0 in | Wt 320.1 lb

## 2011-11-06 DIAGNOSIS — I10 Essential (primary) hypertension: Secondary | ICD-10-CM

## 2011-11-06 DIAGNOSIS — E042 Nontoxic multinodular goiter: Secondary | ICD-10-CM

## 2011-11-06 DIAGNOSIS — M542 Cervicalgia: Secondary | ICD-10-CM

## 2011-11-06 DIAGNOSIS — E119 Type 2 diabetes mellitus without complications: Secondary | ICD-10-CM

## 2011-11-06 DIAGNOSIS — E785 Hyperlipidemia, unspecified: Secondary | ICD-10-CM

## 2011-11-06 LAB — LIPID PANEL
Cholesterol: 164 mg/dL (ref 0–200)
HDL: 43 mg/dL (ref >39)
Total CHOL/HDL Ratio: 3.8 Ratio

## 2011-11-06 LAB — HEMOGLOBIN A1C: Mean Plasma Glucose: 137 mg/dL — ABNORMAL HIGH (ref <117)

## 2011-11-06 MED ORDER — METHYLPREDNISOLONE 4 MG PO KIT: PACK | ORAL | Status: AC

## 2011-11-06 MED ORDER — SILDENAFIL CITRATE 100 MG PO TABS: 50.0000 mg | ORAL_TABLET | Freq: Every day | ORAL | Status: DC | PRN

## 2011-11-06 NOTE — Progress Notes (Signed)
Subjective:    Patient ID: Charles Nash, male    DOB: [birth year], 46 y.o.   MRN: [id number]  HPI   [name] is a 46 yr old male who presents today with chief complaint of neck pain.  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  Reports neck pain- worse when sleeping, radiates to the right shoulder.  He also reports some anterior neck soreness.  He has tried NSAIDS without relief.  Thyroid biopsy- per ENT, benign goiter.  HTN-  BP Readings from Last 3 Encounters:  11/06/11 142/98  08/15/11 139/99  07/31/11 149/82   Pt reports that he did not take meds this AM.     Review of Systems See HPI  Past Medical History  Diagnosis Date  . Hyperlipidemia   . Diabetes mellitus   . Hypertension   . OSA (obstructive sleep apnea)   . Arthritis   . Allergy   . Personal history of colonic polyps   . GERD (gastroesophageal reflux disease)   . Fatty liver 08/06/2011  . Bilateral varicoceles     History   Social History  . Marital Status: Married    Spouse Name: N/A    Number of Children: 2  . Years of Education: N/A   Occupational History  . FOOTBALL COACH    Social History Main Topics  . Smoking status: Never Smoker   . Smokeless tobacco: Never Used  . Alcohol Use: No     occasional wine  . Drug Use: No  . Sexually Active: Not on file   Other Topics Concern  . Not on file   Social History Narrative   Regular exercise:  NoCaffeine use:  2--32oz cups daily    Past Surgical History  Procedure Date  . Knee surgery 08/04/08    Right knee-- medial meniscus repair, attenuation anterior cruciate [name] MD [facility])   . Ankle surgery   . Foot surgery   . Shoulder surgery     Family History  Problem Relation Age of Onset  . Diabetes Mother   . Hypertension Mother   . Arthritis Mother   . Cancer Mother     uterine and breast cancer  . Hyperlipidemia  Father   . Arthritis Father   . Cancer Father     prostate cancer  . Hyperlipidemia Maternal Grandmother   . Hypertension Maternal Grandmother   . Hyperlipidemia Maternal Grandfather   . Hypertension Maternal Grandfather   . Hyperlipidemia Paternal Grandmother   . Hypertension Paternal Grandmother   . Hyperlipidemia Paternal Grandfather   . Hypertension Paternal Grandfather   . Heart attack Paternal Grandfather   . Sudden death Neg Hx   . Colon cancer Neg Hx   . Esophageal cancer Neg Hx   . Stomach cancer Neg Hx   . Rectal cancer Neg Hx     Allergies  Allergen Reactions  . Oxycodone-Acetaminophen Hives and Itching    Current Outpatient Prescriptions on File Prior to Visit  Medication Sig Dispense Refill  . amLODipine (NORVASC) 10 MG tablet Take 10 mg by mouth daily.        Marland Kitchen aspirin 81 MG tablet Take 81 mg by mouth daily.        . FENOFIBRATE PO Take 1 tablet by mouth at bedtime.      . fluticasone (FLONASE) 50 MCG/ACT nasal spray Place 2 sprays into the nose daily.  16 g  6  . hydrochlorothiazide (HYDRODIURIL) 25 MG tablet Take 25 mg by mouth daily.       Marland Kitchen ibuprofen (ADVIL,MOTRIN) 800 MG tablet Take 800 mg by mouth every 8 (eight) hours as needed. Patient used this medication for body aches.      . loratadine (CLARITIN) 10 MG tablet Take 10 mg by mouth daily.      Marland Kitchen losartan (COZAAR) 100 MG tablet Take 1 tablet (100 mg total) by mouth daily.  30 tablet  3  . metFORMIN (GLUCOPHAGE) 500 MG tablet Take 1 tablet (500 mg total) by mouth 2 (two) times daily with a meal.  60 tablet  2  . metoprolol succinate (TOPROL-XL) 50 MG 24 hr tablet Take 50 mg by mouth daily. Take with or immediately following a meal.      . Multiple Vitamin (MULTIVITAMIN) capsule Take 1 capsule by mouth daily.      Marland Kitchen omeprazole (PRILOSEC) 20 MG capsule Take 40 mg by mouth every morning.        . potassium chloride (K-DUR,KLOR-CON) 10 MEQ tablet TAKE 1 TABLET (10 MEQ TOTAL) BY MOUTH DAILY.  30 tablet  2  .  sildenafil (VIAGRA) 100 MG tablet Take 0.5-1 tablets (50-100 mg total) by mouth daily as needed.  3 tablet  0  . clomiPHENE (CLOMID) 50 MG tablet Take 50 mg by mouth daily.      Marland Kitchen DISCONTD: potassium chloride (K-DUR) 10 MEQ tablet Take 1 tablet (10 mEq total) by mouth daily.  30 tablet  1    BP 142/98  Pulse 92  Temp 97.9 F (36.6 C) (Oral)  Resp 18  Ht 6\' 1"  (1.854 m)  Wt 320 lb 1.3 oz (145.187 kg)  BMI 42.23 kg/m2  SpO2 96%        Objective:   Physical Exam  Constitutional: He is oriented to person, place, and time. He appears well-developed and well-nourished. No distress.  Cardiovascular: Normal rate and regular rhythm.   No murmur heard. Pulmonary/Chest: Effort normal and breath sounds normal. No respiratory distress. He has no wheezes. He has no rales. He exhibits no tenderness.  Neurological: He is alert and oriented to person, place, and time.       Bilateral UE strength is 5/5. Strong equal hand grasps.   Psychiatric: He has a normal mood and affect. His behavior is normal. Judgment and thought content normal.          Assessment & Plan:  Instructed pt to contact Urology re: clomid refill.

## 2011-11-06 NOTE — Patient Instructions (Addendum)
Complete your lab work prior to leaving. Please schedule follow up with [facility] for neck pain. Check sugar twice daily while on the Medrol Dose pak.  Call if sugar >300.

## 2011-11-10 ENCOUNTER — Encounter: Payer: Self-pay | Admitting: Family

## 2011-11-10 NOTE — Assessment & Plan Note (Signed)
Deteriorated- did not take meds yet today.  Reminded pt re: importance of med compliance.

## 2011-11-10 NOTE — Assessment & Plan Note (Signed)
Had thyroid biopsy per ENT and was told benign goiter.

## 2011-11-12 DIAGNOSIS — M542 Cervicalgia: Secondary | ICD-10-CM | POA: Insufficient documentation

## 2011-11-12 NOTE — Assessment & Plan Note (Signed)
Pt with neck pain.  Known hx of cervical disc disease.  Already establishes with neurosurgery at [facility].  Instructed pt to schedule follow up apt with neurosurg.  Will rx with medrol dose pak. Pt instructed to monitor sugars closely while on medrol.

## 2011-11-26 ENCOUNTER — Encounter (HOSPITAL_BASED_OUTPATIENT_CLINIC_OR_DEPARTMENT_OTHER): Payer: Self-pay | Admitting: Emergency Medicine

## 2011-11-26 ENCOUNTER — Emergency Department (HOSPITAL_BASED_OUTPATIENT_CLINIC_OR_DEPARTMENT_OTHER)
Admission: EM | Admit: 2011-11-26 | Discharge: 2011-11-27 | Disposition: A | Discharge status: Home / Self Care | Attending: Emergency Medicine | Admitting: Emergency Medicine

## 2011-11-26 DIAGNOSIS — I10 Essential (primary) hypertension: Secondary | ICD-10-CM | POA: Insufficient documentation

## 2011-11-26 DIAGNOSIS — G4733 Obstructive sleep apnea (adult) (pediatric): Secondary | ICD-10-CM | POA: Insufficient documentation

## 2011-11-26 DIAGNOSIS — M129 Arthropathy, unspecified: Secondary | ICD-10-CM | POA: Insufficient documentation

## 2011-11-26 DIAGNOSIS — E119 Type 2 diabetes mellitus without complications: Secondary | ICD-10-CM | POA: Insufficient documentation

## 2011-11-26 DIAGNOSIS — R079 Chest pain, unspecified: Secondary | ICD-10-CM | POA: Insufficient documentation

## 2011-11-26 DIAGNOSIS — E876 Hypokalemia: Secondary | ICD-10-CM

## 2011-11-26 DIAGNOSIS — E785 Hyperlipidemia, unspecified: Secondary | ICD-10-CM | POA: Insufficient documentation

## 2011-11-26 DIAGNOSIS — K219 Gastro-esophageal reflux disease without esophagitis: Secondary | ICD-10-CM | POA: Insufficient documentation

## 2011-11-26 HISTORY — DX: Plantar fascial fibromatosis: M72.2

## 2011-11-26 LAB — CBC WITH DIFFERENTIAL/PLATELET
Eosinophils Absolute: 0.1 10*3/uL (ref 0.0–0.7)
Eosinophils Relative: 1 % (ref 0–5)
Lymphs Abs: 1.8 10*3/uL (ref 0.7–4.0)
MCH: 29.7 pg (ref 26.0–34.0)
MCV: 85.3 fL (ref 78.0–100.0)
Platelets: 298 10*3/uL (ref 150–400)
RDW: 13.5 % (ref 11.5–15.5)

## 2011-11-26 LAB — BASIC METABOLIC PANEL
Calcium: 9.6 mg/dL (ref 8.4–10.5)
Creatinine, Ser: 1.1 mg/dL (ref 0.50–1.35)
GFR calc non Af Amer: 79 mL/min — ABNORMAL LOW (ref >90)
Glucose, Bld: 164 mg/dL — ABNORMAL HIGH (ref 70–99)
Sodium: 139 mEq/L (ref 135–145)

## 2011-11-26 MED ORDER — ASPIRIN 81 MG PO CHEW
324.0000 mg | CHEWABLE_TABLET | Freq: Once | ORAL | Status: AC
  Administered 2011-11-26: 324 mg via ORAL
  Filled 2011-11-26: qty 4

## 2011-11-26 MED ORDER — ONDANSETRON HCL 4 MG/2ML IJ SOLN
4.0000 mg | Freq: Once | INTRAMUSCULAR | Status: AC
  Administered 2011-11-26: 4 mg via INTRAVENOUS
  Filled 2011-11-26: qty 2

## 2011-11-26 MED ORDER — SODIUM CHLORIDE 0.9 % IV SOLN
Freq: Once | INTRAVENOUS | Status: AC
  Administered 2011-11-26: 20 mL/h via INTRAVENOUS

## 2011-11-26 NOTE — ED Provider Notes (Signed)
History     CSN: [id number]  Arrival date & time 11/26/11  2218   First MD Initiated Contact with Patient 11/26/11 2258      Chief Complaint  Patient presents with  . Chest Pain    (Consider location/radiation/quality/duration/timing/severity/associated sxs/prior treatment) HPI This is a 46 year old black male who states his been a lot of stress in his personal life recently. He states he has had nausea and increased bowel movements, but not diarrhea, since eating [place] food for lunch today. 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 The pain lasted about 3 seconds. This was followed by about 20 seconds of a vague pain in his left shoulder and a sharp pain in his left hand which compares to having an IV started. He has subsequently had some soreness of the left chest wall. He denies any dyspnea or diaphoresis. He continues to be nauseated. He states when he checked his blood pressure this evening it was elevated and this made him very anxious. He denies lower extremity pain or swelling. He has had some paresthesias of the lower legs which he states may be related to his diabetes.  Past Medical History  Diagnosis Date  . Hyperlipidemia   . Diabetes mellitus   . Hypertension   . OSA (obstructive sleep apnea)   . Arthritis   . Allergy   . Personal history of colonic polyps   . GERD (gastroesophageal reflux disease)   . Fatty liver 08/06/2011  . Bilateral varicoceles   . Plantar fasciitis     Past Surgical History  Procedure Date  . Knee surgery 08/04/08    Right knee-- medial meniscus repair, attenuation anterior cruciate [name] MD [facility])   . Ankle surgery   . Foot surgery   . Shoulder surgery   . Varicocele excision   . Plantar fascia release     Family History  Problem Relation Age of Onset  . Diabetes Mother   . Hypertension Mother   .  Arthritis Mother   . Cancer Mother     uterine and breast cancer  . Hyperlipidemia Father   . Arthritis Father   . Cancer Father     prostate cancer  . Hyperlipidemia Maternal Grandmother   . Hypertension Maternal Grandmother   . Hyperlipidemia Maternal Grandfather   . Hypertension Maternal Grandfather   . Hyperlipidemia Paternal Grandmother   . Hypertension Paternal Grandmother   . Hyperlipidemia Paternal Grandfather   . Hypertension Paternal Grandfather   . Heart attack Paternal Grandfather   . Sudden death Neg Hx   . Colon cancer Neg Hx   . Esophageal cancer Neg Hx   . Stomach cancer Neg Hx   . Rectal cancer Neg Hx     History  Substance Use Topics  . Smoking status: Never Smoker   . Smokeless tobacco: Never Used  . Alcohol Use: No     occasional wine      Review of Systems  All other systems reviewed and are negative.    Allergies  Oxycodone-acetaminophen  Home Medications   Current Outpatient Rx  Name Route Sig Dispense Refill  . AMLODIPINE BESYLATE 10 MG PO TABS Oral Take 10 mg by mouth daily.      . ASPIRIN 81 MG PO TABS Oral Take 81 mg by mouth daily.      . FENOFIBRATE PO Oral Take 1 tablet by mouth at  bedtime.    Marland Kitchen FLUTICASONE PROPIONATE 50 MCG/ACT NA SUSP Nasal Place 1 spray into the nose daily.    Marland Kitchen HYDROCHLOROTHIAZIDE 25 MG PO TABS Oral Take 25 mg by mouth daily.     Marland Kitchen LOSARTAN POTASSIUM 100 MG PO TABS Oral Take 1 tablet (100 mg total) by mouth daily. 30 tablet 3  . METFORMIN HCL 500 MG PO TABS Oral Take 1 tablet (500 mg total) by mouth 2 (two) times daily with a meal. 60 tablet 2  . METOPROLOL SUCCINATE ER 50 MG PO TB24 Oral Take 50 mg by mouth daily. Take with or immediately following a meal.    . MULTIVITAMINS PO CAPS Oral Take 1 capsule by mouth daily.    Marland Kitchen OMEPRAZOLE 20 MG PO CPDR Oral Take 40 mg by mouth every morning.      Marland Kitchen POTASSIUM CHLORIDE CRYS ER 10 MEQ PO TBCR  TAKE 1 TABLET (10 MEQ TOTAL) BY MOUTH DAILY. 30 tablet 2  . SILDENAFIL  CITRATE 100 MG PO TABS Oral Take 0.5-1 tablets (50-100 mg total) by mouth daily as needed. 3 tablet 0    BP 160/97  Pulse 108  Resp 17  SpO2 96%  Physical Exam General: Well-developed, well-nourished male in no acute distress; appearance consistent with age of record HENT: normocephalic, atraumatic Eyes: pupils equal round and reactive to light; extraocular muscles intact Neck: supple Heart: regular rate and rhythm; tachycardia Lungs: clear to auscultation bilaterally Chest: Mild left chest tenderness Abdomen: soft; nondistended; bowel sounds present Extremities: No deformity; full range of motion; pulses normal; no edema Neurologic: Awake, alert and oriented; motor function intact in all extremities and symmetric; no facial droop Skin: Warm and dry Psychiatric: Normal mood and affect    ED Course  Procedures (including critical care time)    MDM   Nursing notes and vitals signs, including pulse oximetry, reviewed.  Summary of this visit's results, reviewed by myself:  Labs:  Results for orders placed during the hospital encounter of 11/26/11  CBC WITH DIFFERENTIAL      Component Value Range   WBC 6.1  4.0 - 10.5 K/uL   RBC 4.68  4.22 - 5.81 MIL/uL   Hemoglobin 13.9  13.0 - 17.0 g/dL   HCT 40.9  81.1 - 91.4 %   MCV 85.3  78.0 - 100.0 fL   MCH 29.7  26.0 - 34.0 pg   MCHC 34.8  30.0 - 36.0 g/dL   RDW 78.2  95.6 - 21.3 %   Platelets 298  150 - 400 K/uL   Neutrophils Relative 61  43 - 77 %   Neutro Abs 3.7  1.7 - 7.7 K/uL   Lymphocytes Relative 29  12 - 46 %   Lymphs Abs 1.8  0.7 - 4.0 K/uL   Monocytes Relative 9  3 - 12 %   Monocytes Absolute 0.5  0.1 - 1.0 K/uL   Eosinophils Relative 1  0 - 5 %   Eosinophils Absolute 0.1  0.0 - 0.7 K/uL   Basophils Relative 0  0 - 1 %   Basophils Absolute 0.0  0.0 - 0.1 K/uL  BASIC METABOLIC PANEL      Component Value Range   Sodium 139  135 - 145 mEq/L   Potassium 3.2 (*) 3.5 - 5.1 mEq/L   Chloride 100  96 - 112 mEq/L    CO2 28  19 - 32 mEq/L   Glucose, Bld 164 (*) 70 - 99 mg/dL   BUN 9  6 - 23 mg/dL   Creatinine, Ser 1.61  0.50 - 1.35 mg/dL   Calcium 9.6  8.4 - 09.6 mg/dL   GFR calc non Af Amer 79 (*) >90 mL/min   GFR calc Af Amer >90  >90 mL/min  TROPONIN I      Component Value Range   Troponin I <0.30  <0.30 ng/mL  TROPONIN I      Component Value Range   Troponin I <0.30  <0.30 ng/mL      EKG Interpretation:  Date & Time: 11/26/2011 22:31  Rate: 104  Rhythm: sinus tachycardia  QRS Axis: normal  Intervals: normal  ST/T Wave abnormalities: normal  Conduction Disutrbances:left posterior fascicular block  Narrative Interpretation:   Old EKG Reviewed: Rate is faster  1:46 AM Patient symptomatic at this time. His symptomatology is atypical for cardiac chest pain as over the last a few seconds. Advised return for worsening or changing symptoms.       Hanley Seamen, MD 11/27/11 [phone]

## 2011-11-26 NOTE — ED Notes (Signed)
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  Nausea but no vomiting.  No diaphoresis but sts he felt "hot like I had a fever."  Some weakness and tingling in left elbow and hand.

## 2011-11-27 ENCOUNTER — Ambulatory Visit (INDEPENDENT_AMBULATORY_CARE_PROVIDER_SITE_OTHER): Admitting: Internal Medicine

## 2011-11-27 ENCOUNTER — Encounter: Payer: Self-pay | Admitting: Internal Medicine

## 2011-11-27 VITALS — BP 142/94 | HR 80 | Temp 97.8°F | Resp 16 | Wt 319.0 lb

## 2011-11-27 DIAGNOSIS — K2970 Gastritis, unspecified, without bleeding: Secondary | ICD-10-CM

## 2011-11-27 DIAGNOSIS — I10 Essential (primary) hypertension: Secondary | ICD-10-CM

## 2011-11-27 DIAGNOSIS — R0789 Other chest pain: Secondary | ICD-10-CM

## 2011-11-27 LAB — TROPONIN I: Troponin I: <0.3 ng/mL (ref <0.30)

## 2011-11-27 MED ORDER — POTASSIUM CHLORIDE CRYS ER 20 MEQ PO TBCR
40.0000 meq | EXTENDED_RELEASE_TABLET | Freq: Once | ORAL | Status: AC
  Administered 2011-11-27: 40 meq via ORAL
  Filled 2011-11-27: qty 2

## 2011-11-27 MED ORDER — SUCRALFATE 1 G PO TABS: ORAL_TABLET | ORAL | Status: DC

## 2011-11-27 NOTE — ED Notes (Signed)
Pt remains pain free, no episodes of chest pain since arrival, nausea improved

## 2011-11-30 DIAGNOSIS — K2970 Gastritis, unspecified, without bleeding: Secondary | ICD-10-CM | POA: Insufficient documentation

## 2011-11-30 DIAGNOSIS — R0789 Other chest pain: Secondary | ICD-10-CM | POA: Insufficient documentation

## 2011-11-30 NOTE — Assessment & Plan Note (Signed)
Continue ppi. Add short course of carafate and schedule f/u

## 2011-11-30 NOTE — Assessment & Plan Note (Signed)
Recommend outpt bp log to be submitted at follow up

## 2011-11-30 NOTE — Assessment & Plan Note (Signed)
Appears most c/w msk etiology. Followup if no improvement or worsening.

## 2011-11-30 NOTE — Progress Notes (Signed)
  Subjective:    Patient ID: Charles Nash, male    DOB: 1965-08-31, 46 y.o.   MRN: [id number]  HPI Pt presents to clinic for ED follow up of chest discomfort. Yesterday developed left ant cp duration <1 min that was followed by tenderness and reproducible pain with position change. 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 Also has had nausea and dry heaves since yesterday after eating chinese food. Notes temporary increase in bowel movement without diarrhea and bm nl this am. Nausea better today. No abd pain. Potassium low at ED and was treated with k supplementation.   Past Medical History  Diagnosis Date  . Hyperlipidemia   . Diabetes mellitus   . Hypertension   . OSA (obstructive sleep apnea)   . Arthritis   . Allergy   . Personal history of colonic polyps   . GERD (gastroesophageal reflux disease)   . Fatty liver 08/06/2011  . Bilateral varicoceles   . Plantar fasciitis    Past Surgical History  Procedure Date  . Knee surgery 08/04/08    Right knee-- medial meniscus repair, attenuation anterior cruciate [name] MD [facility])   . Ankle surgery   . Foot surgery   . Shoulder surgery   . Varicocele excision   . Plantar fascia release     reports that he has never smoked. He has never used smokeless tobacco. He reports that he does not drink alcohol or use illicit drugs. family history includes Arthritis in his father and mother; Cancer in his father and mother; Diabetes in his mother; Heart attack in his paternal grandfather; Hyperlipidemia in his father, maternal grandfather, maternal grandmother, paternal grandfather, and paternal grandmother; and Hypertension in his maternal grandfather, maternal grandmother, mother, paternal grandfather, and paternal grandmother.  There is no history of Sudden death, and Colon cancer, and Esophageal cancer, and Stomach cancer, and Rectal cancer, . Allergies  Allergen Reactions  . Oxycodone-Acetaminophen Hives and  Itching     Review of Systems see hpi     Objective:   Physical Exam  Nursing note and vitals reviewed. Constitutional: He appears well-developed and well-nourished. No distress.  HENT:  Head: Normocephalic and atraumatic.  Eyes: Conjunctivae are normal. No scleral icterus.  Cardiovascular: Normal rate, regular rhythm and normal heart sounds.  Exam reveals no gallop and no friction rub.   No murmur heard. Pulmonary/Chest: Effort normal and breath sounds normal. No respiratory distress. He has no wheezes. He has no rales.  Abdominal: Soft. Normal appearance and bowel sounds are normal. There is no hepatosplenomegaly. There is tenderness in the epigastric area. There is no rigidity, no rebound and no guarding.  Neurological: He is alert.  Skin: Skin is warm and dry. He is not diaphoretic.          Assessment & Plan:

## 2011-12-04 ENCOUNTER — Ambulatory Visit (INDEPENDENT_AMBULATORY_CARE_PROVIDER_SITE_OTHER): Admitting: Internal Medicine

## 2011-12-04 ENCOUNTER — Encounter: Payer: Self-pay | Admitting: Internal Medicine

## 2011-12-04 VITALS — BP 138/90 | HR 75 | Temp 98.2°F | Resp 18 | Wt 316.8 lb

## 2011-12-04 DIAGNOSIS — I10 Essential (primary) hypertension: Secondary | ICD-10-CM

## 2011-12-04 DIAGNOSIS — E876 Hypokalemia: Secondary | ICD-10-CM

## 2011-12-04 MED ORDER — METOPROLOL SUCCINATE ER 100 MG PO TB24: 100.0000 mg | ORAL_TABLET | Freq: Every day | ORAL | Status: DC

## 2011-12-04 MED ORDER — POTASSIUM CHLORIDE CRYS ER 10 MEQ PO TBCR: EXTENDED_RELEASE_TABLET | ORAL | Status: DC

## 2011-12-04 NOTE — Progress Notes (Signed)
  Subjective:    Patient ID: Charles Nash, male    DOB: 12/29/1965, 46 y.o.   MRN: [id number]  HPI Pt presents to clinic for followup of multiple medical problems. Nausea resolved with carafate. CP resolving and felt to be msk etiology. Reviewed recent hypokalemia. BP remains mildly elevated.  Past Medical History  Diagnosis Date  . Hyperlipidemia   . Diabetes mellitus   . Hypertension   . OSA (obstructive sleep apnea)   . Arthritis   . Allergy   . Personal history of colonic polyps   . GERD (gastroesophageal reflux disease)   . Fatty liver 08/06/2011  . Bilateral varicoceles   . Plantar fasciitis    Past Surgical History  Procedure Date  . Knee surgery 08/04/08    Right knee-- medial meniscus repair, attenuation anterior cruciate [name] MD [facility])   . Ankle surgery   . Foot surgery   . Shoulder surgery   . Varicocele excision   . Plantar fascia release     reports that he has never smoked. He has never used smokeless tobacco. He reports that he does not drink alcohol or use illicit drugs. family history includes Arthritis in his father and mother; Cancer in his father and mother; Diabetes in his mother; Heart attack in his paternal grandfather; Hyperlipidemia in his father, maternal grandfather, maternal grandmother, paternal grandfather, and paternal grandmother; and Hypertension in his maternal grandfather, maternal grandmother, mother, paternal grandfather, and paternal grandmother.  There is no history of Sudden death, and Colon cancer, and Esophageal cancer, and Stomach cancer, and Rectal cancer, . Allergies  Allergen Reactions  . Oxycodone-Acetaminophen Hives and Itching      Review of Systems see hpi     Objective:   Physical Exam  Nursing note and vitals reviewed. Constitutional: He appears well-developed and well-nourished. No distress.  Neurological: He is alert.  Psychiatric: He has a normal mood and affect.            Assessment & Plan:

## 2011-12-07 DIAGNOSIS — E876 Hypokalemia: Secondary | ICD-10-CM | POA: Insufficient documentation

## 2011-12-07 NOTE — Assessment & Plan Note (Signed)
Repeat serum k

## 2011-12-07 NOTE — Assessment & Plan Note (Signed)
suboptimal control. Increase toprol xl 100mg  po qd. Monitor bp as outpt and f/u in clinic as scheduled.

## 2011-12-17 ENCOUNTER — Other Ambulatory Visit: Payer: Self-pay | Admitting: *Deleted

## 2011-12-17 MED ORDER — FENOFIBRATE 54 MG PO TABS: 54.0000 mg | ORAL_TABLET | Freq: Every day | ORAL | Status: DC

## 2011-12-17 MED ORDER — METOPROLOL SUCCINATE ER 100 MG PO TB24: 100.0000 mg | ORAL_TABLET | Freq: Every day | ORAL | Status: DC

## 2011-12-17 NOTE — Telephone Encounter (Signed)
Received message from pt requesting refills of metoprolol and fenofibrate. Reports that Metoprolol was recently increased to 100mg  daily. Also fenofibrate previously came from Dr Venida Jarvis which pt no longer sees. Refills sent to [facility] for # 30 each x 2 refills.  Notified pt.

## 2011-12-29 ENCOUNTER — Encounter: Payer: Self-pay | Admitting: Family

## 2011-12-29 ENCOUNTER — Ambulatory Visit (INDEPENDENT_AMBULATORY_CARE_PROVIDER_SITE_OTHER): Admitting: Family

## 2011-12-29 VITALS — BP 146/96 | HR 76 | Temp 98.4°F | Resp 16 | Wt 312.0 lb

## 2011-12-29 DIAGNOSIS — L509 Urticaria, unspecified: Secondary | ICD-10-CM

## 2011-12-29 DIAGNOSIS — L503 Dermatographic urticaria: Secondary | ICD-10-CM | POA: Insufficient documentation

## 2011-12-29 DIAGNOSIS — I10 Essential (primary) hypertension: Secondary | ICD-10-CM

## 2011-12-29 MED ORDER — PREDNISONE 10 MG PO TABS: ORAL_TABLET | ORAL | Status: DC

## 2011-12-29 NOTE — Assessment & Plan Note (Signed)
Cause unclear.  He denies hx of food allergy.  Recommended that he add zyrtec, short course of prednisone with careful monitoring of sugars.  Pt is instructed to contact us if symptoms worsen, if they do not improve.  Go to ED if tongue/lip swelling.  Pt verbalizes understanding.

## 2011-12-29 NOTE — Progress Notes (Signed)
Subjective:    Patient ID: Charles Nash, male    DOB: [birth year], 46 y.o.   MRN: [id number]  HPI  Mr.  Handshoe is a 46 yr old male who presents today with chief complaint of rash. He reports that rash is located on left arm, right arm and right foot. Started last Thursday- was exposed to cats that day.  Recently moved. Has a new mattress.  Reports that his old mattress had bed bugs.  He denies shortness of breath, tongue/lip swelling.  He has been using hydrocortisone cream with little improvement in his symptoms.    Review of Systems  See HPI  Past Medical History  Diagnosis Date  . Hyperlipidemia   . Diabetes mellitus   . Hypertension   . OSA (obstructive sleep apnea)   . Arthritis   . Allergy   . Personal history of colonic polyps   . GERD (gastroesophageal reflux disease)   . Fatty liver 08/06/2011  . Bilateral varicoceles   . Plantar fasciitis     History   Social History  . Marital Status: Married    Spouse Name: N/A    Number of Children: 2  . Years of Education: N/A   Occupational History  . FOOTBALL COACH    Social History Main Topics  . Smoking status: Never Smoker   . Smokeless tobacco: Never Used  . Alcohol Use: No     occasional wine  . Drug Use: No  . Sexually Active: Not on file   Other Topics Concern  . Not on file   Social History Narrative   Regular exercise:  NoCaffeine use:  2--32oz cups daily    Past Surgical History  Procedure Date  . Knee surgery 08/04/08    Right knee-- medial meniscus repair, attenuation anterior cruciate [name] MD [facility])   . Ankle surgery   . Foot surgery   . Shoulder surgery   . Varicocele excision   . Plantar fascia release     Family History  Problem Relation Age of Onset  . Diabetes Mother   . Hypertension Mother   . Arthritis Mother   . Cancer Mother     uterine and breast cancer  . Hyperlipidemia Father   . Arthritis Father   . Cancer Father     prostate cancer  .  Hyperlipidemia Maternal Grandmother   . Hypertension Maternal Grandmother   . Hyperlipidemia Maternal Grandfather   . Hypertension Maternal Grandfather   . Hyperlipidemia Paternal Grandmother   . Hypertension Paternal Grandmother   . Hyperlipidemia Paternal Grandfather   . Hypertension Paternal Grandfather   . Heart attack Paternal Grandfather   . Sudden death Neg Hx   . Colon cancer Neg Hx   . Esophageal cancer Neg Hx   . Stomach cancer Neg Hx   . Rectal cancer Neg Hx     Allergies  Allergen Reactions  . Oxycodone-Acetaminophen Hives and Itching    Current Outpatient Prescriptions on File Prior to Visit  Medication Sig Dispense Refill  . amLODipine (NORVASC) 10 MG tablet Take 10 mg by mouth daily.        Marland Kitchen aspirin 81 MG tablet Take 81 mg by mouth daily.        . fluticasone (FLONASE) 50 MCG/ACT nasal spray Place 1 spray into the nose daily.      . hydrochlorothiazide (HYDRODIURIL) 25 MG tablet Take 25 mg by mouth daily.       Marland Kitchen losartan (COZAAR) 100 MG tablet  Take 1 tablet (100 mg total) by mouth daily.  30 tablet  3  . metFORMIN (GLUCOPHAGE) 500 MG tablet Take 1 tablet (500 mg total) by mouth 2 (two) times daily with a meal.  60 tablet  2  . metoprolol succinate (TOPROL-XL) 100 MG 24 hr tablet Take 1 tablet (100 mg total) by mouth daily. Take with or immediately following a meal.  30 tablet  2  . Multiple Vitamin (MULTIVITAMIN) capsule Take 1 capsule by mouth daily.      Marland Kitchen omeprazole (PRILOSEC) 20 MG capsule Take 40 mg by mouth every morning.        . potassium chloride (K-DUR,KLOR-CON) 10 MEQ tablet TAKE 1 TABLET (10 MEQ TOTAL) BY MOUTH DAILY.  30 tablet  5  . sildenafil (VIAGRA) 100 MG tablet Take 0.5-1 tablets (50-100 mg total) by mouth daily as needed.  3 tablet  0  . zolpidem (AMBIEN) 10 MG tablet Take 5 mg by mouth at bedtime as needed.      . sucralfate (CARAFATE) 1 G tablet Take one by mouth before meals (and two hours before other medications)  21 tablet  0  . DISCONTD:  potassium chloride (K-DUR) 10 MEQ tablet Take 1 tablet (10 mEq total) by mouth daily.  30 tablet  1    BP 146/96  Pulse 76  Temp 98.4 F (36.9 C) (Oral)  Resp 16  Wt 312 lb (141.522 kg)  SpO2 99%       Objective:   Physical Exam  Constitutional: He appears well-developed and well-nourished. No distress.  HENT:       No tongue or lip swelling is noted.   Cardiovascular: Normal rate and regular rhythm.   No murmur heard. Pulmonary/Chest: Effort normal and breath sounds normal. No respiratory distress. He has no wheezes. He has no rales. He exhibits no tenderness.  Musculoskeletal: He exhibits no edema.  Skin:       Urticaria noted left forearm and right forearm  Psychiatric: He has a normal mood and affect. His behavior is normal. Judgment and thought content normal.          Assessment & Plan:

## 2011-12-29 NOTE — Patient Instructions (Addendum)
Check sugars twice daily while taking prednisone. Call if sugar >300. Call if hives (rash) worsens, or if no improvement in 2-3 days. Start zyrtec 10mg  once daily for the next one week. Go to ER if you develop shortness of breath, tongue or lip swelling. Please schedule a follow up appointment in 3 months.

## 2011-12-29 NOTE — Assessment & Plan Note (Signed)
BP up a bit today, but he reports that he has not yet taken AM meds.  Monitor for now.  Continue current meds.

## 2012-01-13 ENCOUNTER — Telehealth: Payer: Self-pay | Admitting: Family

## 2012-01-13 ENCOUNTER — Encounter: Payer: Self-pay | Admitting: Family

## 2012-01-13 ENCOUNTER — Telehealth: Payer: Self-pay | Admitting: *Deleted

## 2012-01-13 NOTE — Telephone Encounter (Signed)
Received call from pt. He reports recent history of hives and treatment in our clinic. States that he has called the carpet cleaner which tells him the stains in his carpet are likely pet stains and he cannot get them out. Pt reports increased allergy symptoms in his current surroundings and is requesting a letter to his landlord advising him that pt has been treated by [place] for hives and document pt's history of environmental allergies. Please advise.

## 2012-01-13 NOTE — Telephone Encounter (Signed)
Refill- viagra 100mg  tablet. Take 0.5-1 tablets(50-100mg  total) by mouth daily as needed. Qty 3 last fill 7.25.13

## 2012-01-13 NOTE — Telephone Encounter (Signed)
Spoke to pt. He reports + allergy to cats and dogs and reports that he recently learned that the home he is renting had cats and dogs.  He has had worsening allergies and recent hives. He would like letter for land lord.  Told pt that he can pick letter up tomorrow at the front desk tomorrow.

## 2012-01-14 MED ORDER — SILDENAFIL CITRATE 100 MG PO TABS: 50.0000 mg | ORAL_TABLET | Freq: Every day | ORAL | Status: DC | PRN

## 2012-01-14 NOTE — Telephone Encounter (Signed)
OK to send  #3 tabs x 5 refills.

## 2012-01-14 NOTE — Telephone Encounter (Signed)
Please advise; re- refills

## 2012-01-14 NOTE — Telephone Encounter (Signed)
Refill sent to pharmacy.   

## 2012-01-19 ENCOUNTER — Encounter: Payer: Self-pay | Admitting: Family

## 2012-02-02 ENCOUNTER — Ambulatory Visit: Admitting: Family

## 2012-02-02 ENCOUNTER — Encounter: Payer: Self-pay | Admitting: Family

## 2012-02-02 ENCOUNTER — Ambulatory Visit (INDEPENDENT_AMBULATORY_CARE_PROVIDER_SITE_OTHER): Admitting: Family

## 2012-02-02 VITALS — BP 152/100 | HR 88 | Temp 98.8°F | Resp 18 | Wt 322.0 lb

## 2012-02-02 DIAGNOSIS — E785 Hyperlipidemia, unspecified: Secondary | ICD-10-CM

## 2012-02-02 DIAGNOSIS — L509 Urticaria, unspecified: Secondary | ICD-10-CM

## 2012-02-02 MED ORDER — PREDNISONE 10 MG PO TABS: ORAL_TABLET | ORAL | Status: DC

## 2012-02-02 MED ORDER — METHYLPREDNISOLONE SODIUM SUCC 125 MG IJ SOLR
125.0000 mg | Freq: Once | INTRAMUSCULAR | Status: AC
  Administered 2012-02-02: 125 mg via INTRAMUSCULAR

## 2012-02-02 MED ORDER — CETIRIZINE HCL 10 MG PO TABS: 10.0000 mg | ORAL_TABLET | Freq: Every day | ORAL | Status: DC

## 2012-02-02 NOTE — Assessment & Plan Note (Signed)
He is off of fenofibrate.  I have asked him to come back in 1 month for BP check and to come fasting to this apt so that we can check lipids at that time.

## 2012-02-02 NOTE — Assessment & Plan Note (Signed)
Recommended that he see an allergist and resume zyrtec.  IM solumedrol given in office to be followed by a pred taper.

## 2012-02-02 NOTE — Patient Instructions (Addendum)
Please follow up in 1 month for follow up.   Come fasting to this appointment. Go to the ER if you develop tongue/lip swelling.

## 2012-02-02 NOTE — Progress Notes (Signed)
Subjective:    Patient ID: Charles Nash, male    DOB: Apr 09, 1966, 46 y.o.   MRN: [id number]  HPI  Charles Nash is a 46 yr old male who presents today with chief complaint of urticaria.  He reports that most recently, the urticaria started on 10/18.  Hives are located on his arms and are intensely pruritic.  He denies associated tongue/lip swelling or shortness of breath.  He reports that he thinks it may the be the cat that lives in his house. His land lord has agreed to place new carpeting.   HTN- reports that he has not taken his BP meds today.  Hyperlipidemia- he reports that the [place] stopped fenofibrate.  Told him his trigs were ok.   Review of Systems see HPI    Past Medical History  Diagnosis Date  . Hyperlipidemia   . Diabetes mellitus   . Hypertension   . OSA (obstructive sleep apnea)   . Arthritis   . Allergy   . Personal history of colonic polyps   . GERD (gastroesophageal reflux disease)   . Fatty liver 08/06/2011  . Bilateral varicoceles   . Plantar fasciitis     History   Social History  . Marital Status: Married    Spouse Name: N/A    Number of Children: 2  . Years of Education: N/A   Occupational History  . FOOTBALL COACH    Social History Main Topics  . Smoking status: Never Smoker   . Smokeless tobacco: Never Used  . Alcohol Use: No     occasional wine  . Drug Use: No  . Sexually Active: Not on file   Other Topics Concern  . Not on file   Social History Narrative   Regular exercise:  NoCaffeine use:  2--32oz cups daily    Past Surgical History  Procedure Date  . Knee surgery 08/04/08    Right knee-- medial meniscus repair, attenuation anterior cruciate [name] MD [facility])   . Ankle surgery   . Foot surgery   . Shoulder surgery   . Varicocele excision   . Plantar fascia release     Family History  Problem Relation Age of Onset  . Diabetes Mother   . Hypertension Mother   . Arthritis Mother   . Cancer Mother      uterine and breast cancer  . Hyperlipidemia Father   . Arthritis Father   . Cancer Father     prostate cancer  . Hyperlipidemia Maternal Grandmother   . Hypertension Maternal Grandmother   . Hyperlipidemia Maternal Grandfather   . Hypertension Maternal Grandfather   . Hyperlipidemia Paternal Grandmother   . Hypertension Paternal Grandmother   . Hyperlipidemia Paternal Grandfather   . Hypertension Paternal Grandfather   . Heart attack Paternal Grandfather   . Sudden death Neg Hx   . Colon cancer Neg Hx   . Esophageal cancer Neg Hx   . Stomach cancer Neg Hx   . Rectal cancer Neg Hx     Allergies  Allergen Reactions  . Oxycodone-Acetaminophen Hives and Itching    Current Outpatient Prescriptions on File Prior to Visit  Medication Sig Dispense Refill  . amLODipine (NORVASC) 10 MG tablet Take 10 mg by mouth daily.        Marland Kitchen aspirin 81 MG tablet Take 81 mg by mouth daily.        . fluticasone (FLONASE) 50 MCG/ACT nasal spray Place 1 spray into the nose daily.      Marland Kitchen  hydrochlorothiazide (HYDRODIURIL) 25 MG tablet Take 25 mg by mouth daily.       Marland Kitchen losartan (COZAAR) 100 MG tablet Take 1 tablet (100 mg total) by mouth daily.  30 tablet  3  . metFORMIN (GLUCOPHAGE) 500 MG tablet Take 1 tablet (500 mg total) by mouth 2 (two) times daily with a meal.  60 tablet  2  . metoprolol succinate (TOPROL-XL) 100 MG 24 hr tablet Take 1 tablet (100 mg total) by mouth daily. Take with or immediately following a meal.  30 tablet  2  . Multiple Vitamin (MULTIVITAMIN) capsule Take 1 capsule by mouth daily.      Marland Kitchen omeprazole (PRILOSEC) 20 MG capsule Take 40 mg by mouth every morning.        . potassium chloride (K-DUR,KLOR-CON) 10 MEQ tablet TAKE 1 TABLET (10 MEQ TOTAL) BY MOUTH DAILY.  30 tablet  5  . zolpidem (AMBIEN) 10 MG tablet Take 5 mg by mouth at bedtime as needed.      . sildenafil (VIAGRA) 100 MG tablet Take 0.5-1 tablets (50-100 mg total) by mouth daily as needed.  3 tablet  5  .  sucralfate (CARAFATE) 1 G tablet Take one by mouth before meals (and two hours before other medications)  21 tablet  0  . DISCONTD: potassium chloride (K-DUR) 10 MEQ tablet Take 1 tablet (10 mEq total) by mouth daily.  30 tablet  1    BP 152/100  Pulse 88  Temp 98.8 F (37.1 C) (Oral)  Resp 18  Wt 322 lb (146.058 kg)  SpO2 99%    Objective:   Physical Exam  Constitutional: He appears well-developed and well-nourished. No distress.  Cardiovascular: Normal rate and regular rhythm.   No murmur heard. Pulmonary/Chest: Effort normal and breath sounds normal. No respiratory distress. He has no wheezes. He has no rales. He exhibits no tenderness.  Skin:       Hives noted on bilateral forearms          Assessment & Plan:

## 2012-02-02 NOTE — Addendum Note (Signed)
Addended by: Mervin Kung A on: 02/02/2012 01:04 PM   Modules accepted: Orders

## 2012-02-18 ENCOUNTER — Emergency Department (HOSPITAL_BASED_OUTPATIENT_CLINIC_OR_DEPARTMENT_OTHER)
Admission: EM | Admit: 2012-02-18 | Discharge: 2012-02-18 | Disposition: A | Discharge status: Home / Self Care | Attending: Emergency Medicine | Admitting: Emergency Medicine

## 2012-02-18 ENCOUNTER — Encounter (HOSPITAL_BASED_OUTPATIENT_CLINIC_OR_DEPARTMENT_OTHER): Payer: Self-pay | Admitting: *Deleted

## 2012-02-18 ENCOUNTER — Emergency Department (HOSPITAL_BASED_OUTPATIENT_CLINIC_OR_DEPARTMENT_OTHER)

## 2012-02-18 DIAGNOSIS — Z79899 Other long term (current) drug therapy: Secondary | ICD-10-CM | POA: Insufficient documentation

## 2012-02-18 DIAGNOSIS — M129 Arthropathy, unspecified: Secondary | ICD-10-CM | POA: Insufficient documentation

## 2012-02-18 DIAGNOSIS — Z7982 Long term (current) use of aspirin: Secondary | ICD-10-CM | POA: Insufficient documentation

## 2012-02-18 DIAGNOSIS — K219 Gastro-esophageal reflux disease without esophagitis: Secondary | ICD-10-CM | POA: Insufficient documentation

## 2012-02-18 DIAGNOSIS — E119 Type 2 diabetes mellitus without complications: Secondary | ICD-10-CM | POA: Insufficient documentation

## 2012-02-18 DIAGNOSIS — I10 Essential (primary) hypertension: Secondary | ICD-10-CM | POA: Insufficient documentation

## 2012-02-18 DIAGNOSIS — E785 Hyperlipidemia, unspecified: Secondary | ICD-10-CM | POA: Insufficient documentation

## 2012-02-18 DIAGNOSIS — G4733 Obstructive sleep apnea (adult) (pediatric): Secondary | ICD-10-CM | POA: Insufficient documentation

## 2012-02-18 DIAGNOSIS — Z8739 Personal history of other diseases of the musculoskeletal system and connective tissue: Secondary | ICD-10-CM | POA: Insufficient documentation

## 2012-02-18 DIAGNOSIS — Z8679 Personal history of other diseases of the circulatory system: Secondary | ICD-10-CM | POA: Insufficient documentation

## 2012-02-18 LAB — BASIC METABOLIC PANEL
BUN: 8 mg/dL (ref 6–23)
Chloride: 100 mEq/L (ref 96–112)
Creatinine, Ser: 1 mg/dL (ref 0.50–1.35)
GFR calc Af Amer: >90 mL/min (ref >90)
GFR calc non Af Amer: 89 mL/min — ABNORMAL LOW (ref >90)
Glucose, Bld: 158 mg/dL — ABNORMAL HIGH (ref 70–99)

## 2012-02-18 LAB — CBC
HCT: 41.5 % (ref 39.0–52.0)
MCH: 29.5 pg (ref 26.0–34.0)
MCHC: 34.2 g/dL (ref 30.0–36.0)
RDW: 13 % (ref 11.5–15.5)

## 2012-02-18 NOTE — ED Notes (Signed)
Pt denies any CP, no SOB. Pt alert and oriented x4, ambulates with a steady gait.

## 2012-02-18 NOTE — ED Provider Notes (Signed)
History     CSN: [id number]  Arrival date & time 02/18/12  1191   First MD Initiated Contact with Patient 02/18/12 2008      Chief Complaint  Patient presents with  . Headache    (Consider location/radiation/quality/duration/timing/severity/associated sxs/prior treatment) HPI Comments: Pt states that he had an episode today were he felt weak, could concentrate his vision seemed fussy,:pt states that he ate a large meal and drank a sugar soda after not eating for 8 hours and he checked his bs and it was 124:pt states that he has never had low blood sugar:pt states that he has not been taking his bp medications as consistently as he should:denies fever, cp sob:pt states that the symptoms resolved about 1 hour after eating and after taking his bp medication  Patient is a 46 y.o. male presenting with headaches. The history is provided by the patient. No language interpreter was used.  Headache  This is a new problem. The current episode started more than 1 week ago. The problem occurs every few hours. The problem has not changed since onset.The pain is located in the frontal region. The quality of the pain is described as dull. The pain is mild. The pain does not radiate. Associated symptoms include nausea. Pertinent negatives include no fever, no chest pressure, no palpitations, no syncope and no vomiting. He has tried nothing for the symptoms.    Past Medical History  Diagnosis Date  . Hyperlipidemia   . Diabetes mellitus   . Hypertension   . OSA (obstructive sleep apnea)   . Arthritis   . Allergy   . Personal history of colonic polyps   . GERD (gastroesophageal reflux disease)   . Fatty liver 08/06/2011  . Bilateral varicoceles   . Plantar fasciitis     Past Surgical History  Procedure Date  . Knee surgery 08/04/08    Right knee-- medial meniscus repair, attenuation anterior cruciate [name] MD [facility])   . Ankle surgery   . Foot surgery   . Shoulder surgery    . Varicocele excision   . Plantar fascia release     Family History  Problem Relation Age of Onset  . Diabetes Mother   . Hypertension Mother   . Arthritis Mother   . Cancer Mother     uterine and breast cancer  . Hyperlipidemia Father   . Arthritis Father   . Cancer Father     prostate cancer  . Hyperlipidemia Maternal Grandmother   . Hypertension Maternal Grandmother   . Hyperlipidemia Maternal Grandfather   . Hypertension Maternal Grandfather   . Hyperlipidemia Paternal Grandmother   . Hypertension Paternal Grandmother   . Hyperlipidemia Paternal Grandfather   . Hypertension Paternal Grandfather   . Heart attack Paternal Grandfather   . Sudden death Neg Hx   . Colon cancer Neg Hx   . Esophageal cancer Neg Hx   . Stomach cancer Neg Hx   . Rectal cancer Neg Hx     History  Substance Use Topics  . Smoking status: Never Smoker   . Smokeless tobacco: Never Used  . Alcohol Use: No     Comment: occasional wine      Review of Systems  Constitutional: Negative for fever.  Respiratory: Negative.   Cardiovascular: Negative for palpitations and syncope.  Gastrointestinal: Positive for nausea. Negative for vomiting.  Neurological: Positive for headaches.    Allergies  Oxycodone-acetaminophen  Home Medications   Current Outpatient Rx  Name  Route  Sig  Dispense  Refill  . AMLODIPINE BESYLATE 10 MG PO TABS   Oral   Take 10 mg by mouth daily.           . ASPIRIN 81 MG PO TABS   Oral   Take 81 mg by mouth daily.           Marland Kitchen CETIRIZINE HCL 10 MG PO TABS   Oral   Take 1 tablet (10 mg total) by mouth daily.   30 tablet   11   . FLUTICASONE PROPIONATE 50 MCG/ACT NA SUSP   Nasal   Place 1 spray into the nose daily.         Marland Kitchen HYDROCHLOROTHIAZIDE 25 MG PO TABS   Oral   Take 25 mg by mouth daily.          Marland Kitchen LOSARTAN POTASSIUM 100 MG PO TABS   Oral   Take 1 tablet (100 mg total) by mouth daily.   30 tablet   3   . METFORMIN HCL 500 MG PO TABS    Oral   Take 1 tablet (500 mg total) by mouth 2 (two) times daily with a meal.   60 tablet   2   . METOPROLOL SUCCINATE ER 100 MG PO TB24   Oral   Take 1 tablet (100 mg total) by mouth daily. Take with or immediately following a meal.   30 tablet   2   . MULTIVITAMINS PO CAPS   Oral   Take 1 capsule by mouth daily.         Marland Kitchen OMEPRAZOLE 20 MG PO CPDR   Oral   Take 40 mg by mouth every morning.           Marland Kitchen POTASSIUM CHLORIDE CRYS ER 10 MEQ PO TBCR      TAKE 1 TABLET (10 MEQ TOTAL) BY MOUTH DAILY.   30 tablet   5   . SILDENAFIL CITRATE 100 MG PO TABS   Oral   Take 0.5-1 tablets (50-100 mg total) by mouth daily as needed.   3 tablet   5   . ZOLPIDEM TARTRATE 10 MG PO TABS   Oral   Take 5 mg by mouth at bedtime as needed.         Marland Kitchen PREDNISONE 10 MG PO TABS      4 tabs once daily for 4 days.   16 tablet   0   . SUCRALFATE 1 G PO TABS      Take one by mouth before meals (and two hours before other medications)   21 tablet   0     BP 168/95  Pulse 107  Temp 99.8 F (37.7 C) (Oral)  Resp 18  Ht 6\' 1"  (1.854 m)  SpO2 100%  Physical Exam  Nursing note and vitals reviewed. Constitutional: He is oriented to person, place, and time. He appears well-developed and well-nourished.  HENT:  Head: Normocephalic and atraumatic.  Right Ear: External ear normal.  Left Ear: External ear normal.  Eyes: Conjunctivae normal and EOM are normal. Pupils are equal, round, and reactive to light.  Cardiovascular: Normal rate and regular rhythm.   Pulmonary/Chest: Effort normal and breath sounds normal.  Abdominal: Bowel sounds are normal. There is no tenderness.  Musculoskeletal: Normal range of motion.  Neurological: He is alert and oriented to person, place, and time.  Skin: Skin is warm and dry.  Psychiatric: He has a normal mood and affect.    ED  Course  Procedures (including critical care time)  Labs Reviewed  BASIC METABOLIC PANEL - Abnormal; Notable for the  following:    Potassium 3.4 (*)     Glucose, Bld 158 (*)     GFR calc non Af Amer 89 (*)     All other components within normal limits  CBC   Ct Head Wo Contrast  02/18/2012  *RADIOLOGY REPORT*  Clinical Data: Headache and dizziness.  CT HEAD WITHOUT CONTRAST  Technique:  Contiguous axial images were obtained from the base of the skull through the vertex without contrast.  Comparison: CT of the head performed 05/20/2011  Findings: There is no evidence of acute infarction, mass lesion, or intra- or extra-axial hemorrhage on CT.  The posterior fossa, including the cerebellum, brainstem and fourth ventricle, is within normal limits.  The third and lateral ventricles, and basal ganglia are unremarkable in appearance.  The cerebral hemispheres are symmetric in appearance, with normal gray- white differentiation.  No mass effect or midline shift is seen.  There is no evidence of fracture; visualized osseous structures are unremarkable in appearance.  The visualized portions of the orbits are within normal limits.  The paranasal sinuses and mastoid air cells are well-aerated.  No significant soft tissue abnormalities are seen.  IMPRESSION: Unremarkable noncontrast CT of the head.   Original Report Authenticated By: Tonia Ghent, M.D.      1. HTN (hypertension)       MDM  Discussed with pt that symptoms could be related to low bs or htn:pt is feeling fine at this time without any deficits        Teressa Lower, NP 02/18/12 2128

## 2012-02-18 NOTE — ED Provider Notes (Signed)
Medical screening examination/treatment/procedure(s) were performed by non-physician practitioner and as supervising physician I was immediately available for consultation/collaboration.  Marrietta Thunder, MD 02/18/12 2323 

## 2012-02-18 NOTE — ED Notes (Signed)
C/o head pain and pressure x 2 weeks with some periods of HTN.

## 2012-03-05 ENCOUNTER — Ambulatory Visit (INDEPENDENT_AMBULATORY_CARE_PROVIDER_SITE_OTHER): Admitting: Family

## 2012-03-05 ENCOUNTER — Encounter: Payer: Self-pay | Admitting: Family

## 2012-03-05 VITALS — BP 140/90 | HR 78 | Temp 98.7°F | Resp 16 | Ht 73.0 in | Wt 321.0 lb

## 2012-03-05 DIAGNOSIS — E119 Type 2 diabetes mellitus without complications: Secondary | ICD-10-CM

## 2012-03-05 DIAGNOSIS — I10 Essential (primary) hypertension: Secondary | ICD-10-CM

## 2012-03-05 DIAGNOSIS — E785 Hyperlipidemia, unspecified: Secondary | ICD-10-CM

## 2012-03-05 LAB — BASIC METABOLIC PANEL WITH GFR
Calcium: 9.3 mg/dL (ref 8.4–10.5)
GFR, Est African American: >89 mL/min
GFR, Est Non African American: >89 mL/min
Glucose, Bld: 125 mg/dL — ABNORMAL HIGH (ref 70–99)
Potassium: 3.8 mEq/L (ref 3.5–5.3)
Sodium: 138 mEq/L (ref 135–145)

## 2012-03-05 LAB — HEPATIC FUNCTION PANEL
ALT: 102 U/L — ABNORMAL HIGH (ref 0–53)
AST: 58 U/L — ABNORMAL HIGH (ref 0–37)
Albumin: 4.4 g/dL (ref 3.5–5.2)
Bilirubin, Direct: 0.2 mg/dL (ref 0.0–0.3)

## 2012-03-05 LAB — LIPID PANEL
Cholesterol: 164 mg/dL (ref 0–200)
HDL: 41 mg/dL (ref >39)
LDL Cholesterol: 109 mg/dL — ABNORMAL HIGH (ref 0–99)
Triglycerides: 69 mg/dL (ref <150)

## 2012-03-05 NOTE — Assessment & Plan Note (Signed)
Clinically stable.  Obtain A1C.  

## 2012-03-05 NOTE — Progress Notes (Signed)
Subjective:    Patient ID: Charles Nash, male    DOB: [birth year], 46 y.o.   MRN: [id number]  HPI  1. HTN- did not take his BP meds.  Some dizziness with standing up quickly.    2.  Hyperlipidemia- pt was on fenofibrate, but [facility] told [facility] stop due to "fatty liver." watching diet.   3. DM2- reports that sugar running around 112-118. This is during the day.  Fasting is about 101-106.  Review of Systems  Respiratory: Negative for shortness of breath.   Cardiovascular:       Reports  Some "stinging chest pain, brief, can occur on both sides sore to touch." Can occur at rest.   Skin: Positive for pallor.   See HPI  Past Medical History  Diagnosis Date  . Hyperlipidemia   . Diabetes mellitus   . Hypertension   . OSA (obstructive sleep apnea)   . Arthritis   . Allergy   . Personal history of colonic polyps   . GERD (gastroesophageal reflux disease)   . Fatty liver 08/06/2011  . Bilateral varicoceles   . Plantar fasciitis     History   Social History  . Marital Status: Married    Spouse Name: N/A    Number of Children: 2  . Years of Education: N/A   Occupational History  . FOOTBALL COACH    Social History Main Topics  . Smoking status: Never Smoker   . Smokeless tobacco: Never Used  . Alcohol Use: No     Comment: occasional wine  . Drug Use: No  . Sexually Active: Not on file   Other Topics Concern  . Not on file   Social History Narrative   Regular exercise:  NoCaffeine use:  2--32oz cups daily    Past Surgical History  Procedure Date  . Knee surgery 08/04/08    Right knee-- medial meniscus repair, attenuation anterior cruciate [name] MD [facility])   . Ankle surgery   . Foot surgery   . Shoulder surgery   . Varicocele excision   . Plantar fascia release     Family History  Problem Relation Age of Onset  . Diabetes Mother   . Hypertension Mother   . Arthritis Mother   . Cancer Mother     uterine and breast cancer  .  Hyperlipidemia Father   . Arthritis Father   . Cancer Father     prostate cancer  . Hyperlipidemia Maternal Grandmother   . Hypertension Maternal Grandmother   . Hyperlipidemia Maternal Grandfather   . Hypertension Maternal Grandfather   . Hyperlipidemia Paternal Grandmother   . Hypertension Paternal Grandmother   . Hyperlipidemia Paternal Grandfather   . Hypertension Paternal Grandfather   . Heart attack Paternal Grandfather   . Sudden death Neg Hx   . Colon cancer Neg Hx   . Esophageal cancer Neg Hx   . Stomach cancer Neg Hx   . Rectal cancer Neg Hx     Allergies  Allergen Reactions  . Oxycodone-Acetaminophen Hives and Itching    Current Outpatient Prescriptions on File Prior to Visit  Medication Sig Dispense Refill  . amLODipine (NORVASC) 10 MG tablet Take 10 mg by mouth daily.        Marland Kitchen aspirin 81 MG tablet Take 81 mg by mouth daily.        . cetirizine (ZYRTEC) 10 MG tablet Take 1 tablet (10 mg total) by mouth daily.  30 tablet  11  .  fluticasone (FLONASE) 50 MCG/ACT nasal spray Place 1 spray into the nose daily.      . hydrochlorothiazide (HYDRODIURIL) 25 MG tablet Take 25 mg by mouth daily.       Marland Kitchen losartan (COZAAR) 100 MG tablet Take 1 tablet (100 mg total) by mouth daily.  30 tablet  3  . metFORMIN (GLUCOPHAGE) 500 MG tablet Take 1 tablet (500 mg total) by mouth 2 (two) times daily with a meal.  60 tablet  2  . metoprolol succinate (TOPROL-XL) 100 MG 24 hr tablet Take 1 tablet (100 mg total) by mouth daily. Take with or immediately following a meal.  30 tablet  2  . Multiple Vitamin (MULTIVITAMIN) capsule Take 1 capsule by mouth daily.      Marland Kitchen omeprazole (PRILOSEC) 20 MG capsule Take 40 mg by mouth every morning.        Marland Kitchen PAROXETINE HCL PO Take 0.5 tablets by mouth daily.      . potassium chloride (K-DUR,KLOR-CON) 10 MEQ tablet TAKE 1 TABLET (10 MEQ TOTAL) BY MOUTH DAILY.  30 tablet  5  . sildenafil (VIAGRA) 100 MG tablet Take 0.5-1 tablets (50-100 mg total) by mouth  daily as needed.  3 tablet  5  . zolpidem (AMBIEN) 10 MG tablet Take 5 mg by mouth at bedtime as needed.      . predniSONE (DELTASONE) 10 MG tablet 4 tabs once daily for 4 days.  16 tablet  0  . sucralfate (CARAFATE) 1 G tablet Take one by mouth before meals (and two hours before other medications)  21 tablet  0  . [DISCONTINUED] potassium chloride (K-DUR) 10 MEQ tablet Take 1 tablet (10 mEq total) by mouth daily.  30 tablet  1    BP 140/90  Pulse 78  Temp 98.7 F (37.1 C) (Oral)  Resp 16  Ht 6\' 1"  (1.854 m)  Wt 321 lb 0.6 oz (145.623 kg)  BMI 42.36 kg/m2  SpO2 98%       Objective:   Physical Exam  Constitutional: He is oriented to person, place, and time. He appears well-developed and well-nourished. No distress.  HENT:  Head: Normocephalic and atraumatic.  Cardiovascular: Normal rate and regular rhythm.   No murmur heard. Pulmonary/Chest: Effort normal and breath sounds normal. No respiratory distress. He has no wheezes. He has no rales. He exhibits no tenderness.  Musculoskeletal: He exhibits no edema.  Neurological: He is alert and oriented to person, place, and time.  Skin: Skin is warm and dry.  Psychiatric: He has a normal mood and affect. His behavior is normal. Judgment and thought content normal.          Assessment & Plan:

## 2012-03-05 NOTE — Assessment & Plan Note (Signed)
BP up a bit, but he did not take his meds this AM.  I advised him to take meds before his next apt so we can see how his BP looks.

## 2012-03-05 NOTE — Assessment & Plan Note (Addendum)
Not currently on statin or fenofibrate.  Repeat flp and lft.

## 2012-03-05 NOTE — Patient Instructions (Addendum)
Please complete your lab work prior to leaving. Follow up in 3 months.   

## 2012-03-08 ENCOUNTER — Encounter: Payer: Self-pay | Admitting: Family

## 2012-03-08 ENCOUNTER — Telehealth: Payer: Self-pay | Admitting: *Deleted

## 2012-03-08 MED ORDER — OMEPRAZOLE 20 MG PO CPDR: 40.0000 mg | DELAYED_RELEASE_CAPSULE | ORAL | Status: DC

## 2012-03-08 NOTE — Telephone Encounter (Signed)
OK to send refill.  Please see additional phone note re: lab work.

## 2012-03-08 NOTE — Telephone Encounter (Signed)
Received message from pt requesting test results from Friday. Also requesting 30 day supply of Omeprazole until he can get a shipment from the [place] and wants to know if he is supposed to restart his cholesterol medication?  Refill sent to [facility]. Please advise.

## 2012-03-08 NOTE — Telephone Encounter (Signed)
Notified pt and mailed lab letter. Pt voices understanding. Advised pt to continue to stay off cholesterol medication at this time.

## 2012-04-06 ENCOUNTER — Ambulatory Visit (INDEPENDENT_AMBULATORY_CARE_PROVIDER_SITE_OTHER): Admitting: Family

## 2012-04-06 ENCOUNTER — Encounter: Payer: Self-pay | Admitting: Family

## 2012-04-06 VITALS — BP 144/96 | HR 96 | Temp 98.2°F | Resp 16 | Wt 320.1 lb

## 2012-04-06 DIAGNOSIS — H6690 Otitis media, unspecified, unspecified ear: Secondary | ICD-10-CM

## 2012-04-06 DIAGNOSIS — H6691 Otitis media, unspecified, right ear: Secondary | ICD-10-CM

## 2012-04-06 MED ORDER — AMOXICILLIN-POT CLAVULANATE 875-125 MG PO TABS: 1.0000 | ORAL_TABLET | Freq: Two times a day (BID) | ORAL | Status: DC

## 2012-04-06 NOTE — Assessment & Plan Note (Signed)
Suspect discomfort is radiating from right sided OM. Will rx with Augmentin.  Pt is instructed to call if symptoms worsen or if not resolved by the time he completes his abx.  Pt verbalizes understanding.

## 2012-04-06 NOTE — Progress Notes (Signed)
Subjective:    Patient ID: Charles Nash, male    DOB: 12/31/1965, 46 y.o.   MRN: [id number]  HPI  Reports right sided anterior neck soreness.  Worse in the mornings.  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  Thinks that this is wearing down his voice.  He reports that it does not hurt to swallow.  Feels a fullness on the right with swallowing.   These symptoms started 10 days ago. Denies fever or nasal drainage.  Review of Systems See HPI  Past Medical History  Diagnosis Date  . Hyperlipidemia   . Diabetes mellitus   . Hypertension   . OSA (obstructive sleep apnea)   . Arthritis   . Allergy   . Personal history of colonic polyps   . GERD (gastroesophageal reflux disease)   . Fatty liver 08/06/2011  . Bilateral varicoceles   . Plantar fasciitis     History   Social History  . Marital Status: Married    Spouse Name: N/A    Number of Children: 2  . Years of Education: N/A   Occupational History  . FOOTBALL COACH    Social History Main Topics  . Smoking status: Never Smoker   . Smokeless tobacco: Never Used  . Alcohol Use: No     Comment: occasional wine  . Drug Use: No  . Sexually Active: Not on file   Other Topics Concern  . Not on file   Social History Narrative   Regular exercise:  NoCaffeine use:  2--32oz cups daily    Past Surgical History  Procedure Date  . Knee surgery 08/04/08    Right knee-- medial meniscus repair, attenuation anterior cruciate [name] MD [facility])   . Ankle surgery   . Foot surgery   . Shoulder surgery   . Varicocele excision   . Plantar fascia release     Family History  Problem Relation Age of Onset  . Diabetes Mother   . Hypertension Mother   . Arthritis Mother   . Cancer Mother     uterine and breast cancer  . Hyperlipidemia Father   . Arthritis Father   . Cancer Father     prostate cancer  . Hyperlipidemia Maternal Grandmother   . Hypertension Maternal Grandmother    . Hyperlipidemia Maternal Grandfather   . Hypertension Maternal Grandfather   . Hyperlipidemia Paternal Grandmother   . Hypertension Paternal Grandmother   . Hyperlipidemia Paternal Grandfather   . Hypertension Paternal Grandfather   . Heart attack Paternal Grandfather   . Sudden death Neg Hx   . Colon cancer Neg Hx   . Esophageal cancer Neg Hx   . Stomach cancer Neg Hx   . Rectal cancer Neg Hx     Allergies  Allergen Reactions  . Oxycodone-Acetaminophen Hives and Itching    Current Outpatient Prescriptions on File Prior to Visit  Medication Sig Dispense Refill  . amLODipine (NORVASC) 10 MG tablet Take 10 mg by mouth daily.        Marland Kitchen aspirin 81 MG tablet Take 81 mg by mouth daily.        . cetirizine (ZYRTEC) 10 MG tablet Take 1 tablet (10 mg total) by mouth daily.  30 tablet  11  . fluticasone (FLONASE) 50 MCG/ACT nasal spray Place 1 spray into the nose daily.      . hydrochlorothiazide (HYDRODIURIL) 25 MG tablet Take 25 mg by mouth daily.       Marland Kitchen  losartan (COZAAR) 100 MG tablet Take 1 tablet (100 mg total) by mouth daily.  30 tablet  3  . metFORMIN (GLUCOPHAGE) 500 MG tablet Take 1 tablet (500 mg total) by mouth 2 (two) times daily with a meal.  60 tablet  2  . metoprolol succinate (TOPROL-XL) 100 MG 24 hr tablet Take 1 tablet (100 mg total) by mouth daily. Take with or immediately following a meal.  30 tablet  2  . omeprazole (PRILOSEC) 20 MG capsule Take 2 capsules (40 mg total) by mouth every morning.  60 capsule  0  . PAROXETINE HCL PO Take 0.5 tablets by mouth daily.      . potassium chloride (K-DUR,KLOR-CON) 10 MEQ tablet TAKE 1 TABLET (10 MEQ TOTAL) BY MOUTH DAILY.  30 tablet  5  . sildenafil (VIAGRA) 100 MG tablet Take 0.5-1 tablets (50-100 mg total) by mouth daily as needed.  3 tablet  5  . sucralfate (CARAFATE) 1 G tablet Take one by mouth before meals (and two hours before other medications)      . zolpidem (AMBIEN) 10 MG tablet Take 5 mg by mouth at bedtime as needed.       . Multiple Vitamin (MULTIVITAMIN) capsule Take 1 capsule by mouth daily.      . [DISCONTINUED] potassium chloride (K-DUR) 10 MEQ tablet Take 1 tablet (10 mEq total) by mouth daily.  30 tablet  1    BP 144/96  Pulse 96  Temp 98.2 F (36.8 C) (Oral)  Resp 16  Wt 320 lb 1.3 oz (145.187 kg)  SpO2 96%       Objective:   Physical Exam  Constitutional: He is oriented to person, place, and time. He appears well-developed and well-nourished. No distress.  HENT:  Head: Normocephalic and atraumatic.  Right Ear: Tympanic membrane is erythematous. Tympanic membrane is not bulging.  Left Ear: Tympanic membrane and ear canal normal.  Mouth/Throat: No oropharyngeal exudate, posterior oropharyngeal edema or posterior oropharyngeal erythema.  Cardiovascular: Normal rate and regular rhythm.   No murmur heard. Pulmonary/Chest: Effort normal and breath sounds normal. No respiratory distress. He has no wheezes. He has no rales. He exhibits no tenderness.  Musculoskeletal: He exhibits no edema.  Lymphadenopathy:    He has no cervical adenopathy.  Neurological: He is alert and oriented to person, place, and time.  Psychiatric: He has a normal mood and affect. His behavior is normal. Judgment and thought content normal.          Assessment & Plan:

## 2012-04-06 NOTE — Patient Instructions (Addendum)

## 2012-05-04 ENCOUNTER — Encounter: Payer: Self-pay | Admitting: Family

## 2012-05-04 ENCOUNTER — Ambulatory Visit (INDEPENDENT_AMBULATORY_CARE_PROVIDER_SITE_OTHER): Admitting: Family

## 2012-05-04 VITALS — BP 148/100 | HR 89 | Temp 98.8°F | Resp 16 | Ht 73.0 in | Wt 320.0 lb

## 2012-05-04 DIAGNOSIS — I10 Essential (primary) hypertension: Secondary | ICD-10-CM

## 2012-05-04 DIAGNOSIS — R42 Dizziness and giddiness: Secondary | ICD-10-CM

## 2012-05-04 MED ORDER — CLONIDINE HCL 0.1 MG PO TABS: 0.1000 mg | ORAL_TABLET | Freq: Three times a day (TID) | ORAL | Status: DC

## 2012-05-04 NOTE — Progress Notes (Signed)
Subjective:    Patient ID: Charles Nash, male    DOB: 1965/10/25, 47 y.o.   MRN: [id number]  HPI  Mr. Carrero is a 47 yr old male who presents today for follow up.  HTN- Reports that BP's have been great at home.  Reports mild dizziness this morning at lunch around 12:30.  Reports that bp was 154/92 at that time.  He denies chest pain or shortness of breath.    Pt took paxil x 2.5 weeks prior to stopping. Didn't feel right on it. Reports increased stress at home.  In marriage counseling.      Review of Systems see HPI    Past Medical History  Diagnosis Date  . Hyperlipidemia   . Diabetes mellitus   . Hypertension   . OSA (obstructive sleep apnea)   . Arthritis   . Allergy   . Personal history of colonic polyps   . GERD (gastroesophageal reflux disease)   . Fatty liver 08/06/2011  . Bilateral varicoceles   . Plantar fasciitis     History   Social History  . Marital Status: Married    Spouse Name: N/A    Number of Children: 2  . Years of Education: N/A   Occupational History  . FOOTBALL COACH    Social History Main Topics  . Smoking status: Never Smoker   . Smokeless tobacco: Never Used  . Alcohol Use: No     Comment: occasional wine  . Drug Use: No  . Sexually Active: Not on file   Other Topics Concern  . Not on file   Social History Narrative   Regular exercise:  NoCaffeine use:  2--32oz cups daily    Past Surgical History  Procedure Date  . Knee surgery 08/04/08    Right knee-- medial meniscus repair, attenuation anterior cruciate [name] MD [facility])   . Ankle surgery   . Foot surgery   . Shoulder surgery   . Varicocele excision   . Plantar fascia release     Family History  Problem Relation Age of Onset  . Diabetes Mother   . Hypertension Mother   . Arthritis Mother   . Cancer Mother     uterine and breast cancer  . Hyperlipidemia Father   . Arthritis Father   . Cancer Father     prostate cancer  .  Hyperlipidemia Maternal Grandmother   . Hypertension Maternal Grandmother   . Hyperlipidemia Maternal Grandfather   . Hypertension Maternal Grandfather   . Hyperlipidemia Paternal Grandmother   . Hypertension Paternal Grandmother   . Hyperlipidemia Paternal Grandfather   . Hypertension Paternal Grandfather   . Heart attack Paternal Grandfather   . Sudden death Neg Hx   . Colon cancer Neg Hx   . Esophageal cancer Neg Hx   . Stomach cancer Neg Hx   . Rectal cancer Neg Hx     Allergies  Allergen Reactions  . Oxycodone-Acetaminophen Hives and Itching    Current Outpatient Prescriptions on File Prior to Visit  Medication Sig Dispense Refill  . amLODipine (NORVASC) 10 MG tablet Take 10 mg by mouth daily.        Marland Kitchen aspirin 81 MG tablet Take 81 mg by mouth daily.        . cetirizine (ZYRTEC) 10 MG tablet Take 1 tablet (10 mg total) by mouth daily.  30 tablet  11  . fluticasone (FLONASE) 50 MCG/ACT nasal spray Place 1 spray into the nose daily.      Marland Kitchen  hydrochlorothiazide (HYDRODIURIL) 25 MG tablet Take 25 mg by mouth daily.       Marland Kitchen losartan (COZAAR) 100 MG tablet Take 1 tablet (100 mg total) by mouth daily.  30 tablet  3  . metFORMIN (GLUCOPHAGE) 500 MG tablet Take 1 tablet (500 mg total) by mouth 2 (two) times daily with a meal.  60 tablet  2  . metoprolol succinate (TOPROL-XL) 100 MG 24 hr tablet Take 1 tablet (100 mg total) by mouth daily. Take with or immediately following a meal.  30 tablet  2  . Multiple Vitamin (MULTIVITAMIN) capsule Take 1 capsule by mouth daily.      Marland Kitchen omeprazole (PRILOSEC) 20 MG capsule Take 2 capsules (40 mg total) by mouth every morning.  60 capsule  0  . potassium chloride (K-DUR,KLOR-CON) 10 MEQ tablet TAKE 1 TABLET (10 MEQ TOTAL) BY MOUTH DAILY.  30 tablet  5  . sildenafil (VIAGRA) 100 MG tablet Take 0.5-1 tablets (50-100 mg total) by mouth daily as needed.  3 tablet  5  . zolpidem (AMBIEN) 10 MG tablet Take 5 mg by mouth at bedtime as needed.      .  cloNIDine (CATAPRES) 0.1 MG tablet Take 1 tablet (0.1 mg total) by mouth 3 (three) times daily.  60 tablet  0  . sucralfate (CARAFATE) 1 G tablet Take one by mouth before meals (and two hours before other medications)      . [DISCONTINUED] potassium chloride (K-DUR) 10 MEQ tablet Take 1 tablet (10 mEq total) by mouth daily.  30 tablet  1    BP 148/100  Pulse 89  Temp 98.8 F (37.1 C) (Oral)  Resp 16  Ht 6\' 1"  (1.854 m)  Wt 320 lb 0.6 oz (145.169 kg)  BMI 42.22 kg/m2  SpO2 98%    Objective:   Physical Exam  Constitutional: He is oriented to person, place, and time. He appears well-developed and well-nourished. No distress.  HENT:  Head: Normocephalic and atraumatic.  Cardiovascular: Normal rate.   No murmur heard. Pulmonary/Chest: Effort normal and breath sounds normal. No respiratory distress. He has no wheezes. He has no rales. He exhibits no tenderness.  Musculoskeletal: He exhibits no edema.  Lymphadenopathy:    He has no cervical adenopathy.  Neurological: He is alert and oriented to person, place, and time.  Psychiatric: He has a normal mood and affect. His behavior is normal. Judgment and thought content normal.          Assessment & Plan:

## 2012-05-04 NOTE — Patient Instructions (Addendum)
Please follow up in 2 weeks for blood pressure check.  You will be contact about your referral for stress test.  Please let us know if you have not heard back within 1 week about your referral.

## 2012-05-07 DIAGNOSIS — R42 Dizziness and giddiness: Secondary | ICD-10-CM | POA: Insufficient documentation

## 2012-05-07 NOTE — Assessment & Plan Note (Signed)
Etiology unclear. EKG is performed today and reviewed- reveals NSR, without acute changes.  Given his multiple cardiac risk factors- I have recommended that he proceed with a stress test for further evaluation. Pt is agreeable.

## 2012-05-07 NOTE — Assessment & Plan Note (Signed)
Deteriorated. Recommended that he add clonidine to his regimen.  Continue losartan, htz,  and toprol.

## 2012-05-10 ENCOUNTER — Telehealth: Payer: Self-pay | Admitting: *Deleted

## 2012-05-10 NOTE — Telephone Encounter (Signed)
Received message from pt stating he thought he was told to take clonidine twice a day but Rx directions say 1 three times a day. Pt wants to know which is the correct directions?  Pt states he has had increased fatigue since starting Clonidine. States that his BP yesterday was 93/58 on the three times daily dose. Pt states he has only taken clonidine twice a day today and most recent BP was 115/75. Pt is concerned about all of the medications that he is currently taking for his BP control. Pt wants to know if you can try to put him on one medication that may be a little stronger dose along with a fluid pill to try and replace the multiple meds he is currently taking for his BP. Recalls that he has had leg weakness and fatigue all the way back to when he started metoprolol. Please advise.

## 2012-05-11 MED ORDER — OLMESARTAN-AMLODIPINE-HCTZ 40-10-25 MG PO TABS: 1.0000 | ORAL_TABLET | Freq: Every day | ORAL | Status: DC

## 2012-05-11 NOTE — Telephone Encounter (Signed)
Notified pt, he voices understanding and samples have been left at front desk for pick up. Pt will schedule appt when he picks up samples.

## 2012-05-11 NOTE — Telephone Encounter (Signed)
Unfortunately, his blood pressure requires multiple medications to keep him controlled.  We can try to consolidate his medications.  He can stop norvasc, cozaar, hctz. Clonidine.   Instead he can take tribenzor which is once daily.  He should continue metoprolol for now.  Follow up in 2 weeks in office, make sure to take meds that morning.  I will leave samples of tribenzor for him.

## 2012-05-13 ENCOUNTER — Telehealth: Payer: Self-pay | Admitting: *Deleted

## 2012-05-13 MED ORDER — GLUCOSE BLOOD VI STRP: ORAL_STRIP | Status: DC

## 2012-05-13 MED ORDER — ONETOUCH ULTRASOFT LANCETS MISC: Status: DC

## 2012-05-13 MED ORDER — ONETOUCH ULTRA 2 W/DEVICE KIT: PACK | Status: DC

## 2012-05-13 NOTE — Telephone Encounter (Signed)
Received message from pt that his one touch meter no longer works and he is requesting Rx [facility] meter [facility]. Rxs sent. Pt states his FBS was 145 this morning and he wants to know what his goal should be?  Please advise.

## 2012-05-13 NOTE — Telephone Encounter (Signed)
Goal FBS is 80-110.

## 2012-05-14 NOTE — Telephone Encounter (Addendum)
Notified pt. Pt stated that the morning his blood sugar was 145 it dropped to 68 that evening and pt reports that he did not skip a meal and was not more active that day. Advised pt to continue to monitor blood sugar and if he continues to have low blood sugar episodes to call and let us know.

## 2012-05-17 ENCOUNTER — Encounter (HOSPITAL_COMMUNITY)

## 2012-05-19 ENCOUNTER — Ambulatory Visit (HOSPITAL_COMMUNITY): Attending: Cardiology | Admitting: Radiology

## 2012-05-19 VITALS — BP 147/81 | HR 96 | Ht 72.0 in | Wt 314.0 lb

## 2012-05-19 DIAGNOSIS — R55 Syncope and collapse: Secondary | ICD-10-CM | POA: Insufficient documentation

## 2012-05-19 DIAGNOSIS — Z8249 Family history of ischemic heart disease and other diseases of the circulatory system: Secondary | ICD-10-CM | POA: Insufficient documentation

## 2012-05-19 DIAGNOSIS — R42 Dizziness and giddiness: Secondary | ICD-10-CM

## 2012-05-19 DIAGNOSIS — I10 Essential (primary) hypertension: Secondary | ICD-10-CM | POA: Insufficient documentation

## 2012-05-19 DIAGNOSIS — E119 Type 2 diabetes mellitus without complications: Secondary | ICD-10-CM | POA: Insufficient documentation

## 2012-05-19 MED ORDER — TECHNETIUM TC 99M SESTAMIBI GENERIC - CARDIOLITE
33.0000 | Freq: Once | INTRAVENOUS | Status: AC | PRN
  Administered 2012-05-19: 33 via INTRAVENOUS

## 2012-05-19 NOTE — Progress Notes (Signed)
[facility] SITE 3 NUCLEAR MED [address] [place], [place] [address] [phone]    Cardiology Nuclear Med Study  Charles Nash is a 47 y.o. male     MRN : [id number]     DOB: 1965-10-01  Procedure Date: 05/19/2012  Nuclear Med Background Indication for Stress Test:  Evaluation for Ischemia History:  ~2 yrs ago [id number] per patient Cardiac Risk Factors: Family History - CAD, Hypertension, Lipids, NIDDM and Obesity  Symptoms:  Dizziness and Near Syncope   Nuclear Pre-Procedure Caffeine/Decaff Intake:  None NPO After: 6:30 pm   Lungs:  Clear. O2 Sat: 99% on room air. IV 0.9% NS with Angio Cath:  22g  IV Site: R Hand  IV Started by:  Bonnita Levan, RN  Chest Size (in):  50+ Cup Size: n/a  Height: 6' (1.829 m)  Weight:  314 lb (142.429 kg)  BMI:  Body mass index is 42.59 kg/(m^2). Tech Comments:  Toprol held x 18 hours    Nuclear Med Study 1 or 2 day study: 2 day  Stress Test Type:  Stress  Reading MD: Maisie Fus Laityn Bensen,M.D. Order Authorizing Provider:  Sandford Craze, NP/Stacy Abner Greenspan, MD  Resting Radionuclide: Technetium 67m Sestamibi  Resting Radionuclide Dose: 33.0 mCi  On      05-25-12  Stress Radionuclide:  Technetium 45m Sestamibi  Stress Radionuclide Dose: 33.0 mCi   On        05-19-12          Stress Protocol Rest HR: 96 Stress HR: 164  Rest BP: 147/81 Stress BP: 227/61  Exercise Time (min): 7:00 METS: 7.2   Predicted Max HR: 175 bpm % Max HR: 93.71 bpm Rate Pressure Product: 46962    Dose of Adenosine (mg):  n/a Dose of Lexiscan: n/a mg  Dose of Atropine (mg): n/a Dose of Dobutamine: n/a mcg/kg/min (at max HR)  Stress Test Technologist: Smiley Houseman, CMA-N  Nuclear Technologist:  Domenic Polite, CNMT     Rest Procedure:  Myocardial perfusion imaging was performed at rest 45 minutes following the intravenous administration of Technetium 80m Sestamibi.  Rest ECG: NSR with non-specific ST-T wave changes  Stress Procedure:  The  patient exercised on the treadmill utilizing the Bruce Protocol for seven minutes. The patient stopped due to fatigue and denied any chest pain.  Technetium 26m Sestamibi was injected at peak exercise and myocardial perfusion imaging was performed after a brief delay.  Stress ECG: No significant change from baseline ECG  QPS Raw Data Images:  Normal; no motion artifact; normal heart/lung ratio. Stress Images:  Normal homogeneous uptake in all areas of the myocardium. Rest Images:  Normal homogeneous uptake in all areas of the myocardium. Subtraction (SDS):  No evidence of ischemia. Transient Ischemic Dilatation (Normal <1.22):  0.97 Lung/Heart Ratio (Normal <0.45):  0.20  Quantitative Gated Spect Images QGS EDV:  146 ml QGS ESV:  59 ml  Impression Exercise Capacity:  Good exercise capacity. BP Response:  Hypertensive blood pressure response. Clinical Symptoms:  No chest pain. ECG Impression:  No significant ST segment change suggestive of ischemia. Comparison with Prior Nuclear Study: No images to compare  Overall Impression:  Normal stress nuclear study. No evidence of ischemia. Hypertensive response to exercise.  LV Ejection Fraction: 60%.  LV Wall Motion:  NL LV Function; NL Wall Motion  [employer]

## 2012-05-20 ENCOUNTER — Encounter (HOSPITAL_COMMUNITY)

## 2012-05-24 ENCOUNTER — Telehealth: Payer: Self-pay | Admitting: *Deleted

## 2012-05-24 MED ORDER — OLMESARTAN-AMLODIPINE-HCTZ 40-10-25 MG PO TABS: 1.0000 | ORAL_TABLET | Freq: Every day | ORAL | Status: DC

## 2012-05-24 NOTE — Telephone Encounter (Signed)
Pt called stating he took his last tribenzor today and has follow up with [place] on Wednesday. Pt requested a sample pack to last until appt. 1 pack placed at front desk for pick up.

## 2012-05-25 ENCOUNTER — Ambulatory Visit (HOSPITAL_COMMUNITY): Attending: Cardiology

## 2012-05-25 DIAGNOSIS — R0989 Other specified symptoms and signs involving the circulatory and respiratory systems: Secondary | ICD-10-CM

## 2012-05-25 MED ORDER — TECHNETIUM TC 99M SESTAMIBI GENERIC - CARDIOLITE
33.0000 | Freq: Once | INTRAVENOUS | Status: AC | PRN
  Administered 2012-05-25: 33 via INTRAVENOUS

## 2012-05-26 ENCOUNTER — Encounter: Payer: Self-pay | Admitting: Family

## 2012-05-26 ENCOUNTER — Telehealth: Payer: Self-pay | Admitting: *Deleted

## 2012-05-26 ENCOUNTER — Ambulatory Visit (INDEPENDENT_AMBULATORY_CARE_PROVIDER_SITE_OTHER): Admitting: Family

## 2012-05-26 VITALS — BP 136/90 | HR 75 | Temp 98.4°F | Resp 16 | Ht 73.0 in | Wt 322.1 lb

## 2012-05-26 DIAGNOSIS — J3489 Other specified disorders of nose and nasal sinuses: Secondary | ICD-10-CM

## 2012-05-26 DIAGNOSIS — I10 Essential (primary) hypertension: Secondary | ICD-10-CM

## 2012-05-26 DIAGNOSIS — E785 Hyperlipidemia, unspecified: Secondary | ICD-10-CM

## 2012-05-26 DIAGNOSIS — R0981 Nasal congestion: Secondary | ICD-10-CM

## 2012-05-26 LAB — BASIC METABOLIC PANEL
BUN: 11 mg/dL (ref 6–23)
CO2: 26 mEq/L (ref 19–32)
Chloride: 100 mEq/L (ref 96–112)
Glucose, Bld: 108 mg/dL — ABNORMAL HIGH (ref 70–99)
Potassium: 3.7 mEq/L (ref 3.5–5.3)
Sodium: 137 mEq/L (ref 135–145)

## 2012-05-26 MED ORDER — OLMESARTAN-AMLODIPINE-HCTZ 40-10-25 MG PO TABS: 1.0000 | ORAL_TABLET | Freq: Every day | ORAL | Status: DC

## 2012-05-26 NOTE — Patient Instructions (Addendum)
Please complete your lab work prior to leaving. Follow up in 3 months.   

## 2012-05-26 NOTE — Progress Notes (Signed)
Subjective:    Patient ID: Charles Nash, male    DOB: May 12, 1965, 47 y.o.   MRN: [id number]  HPI  Mr.  Porzio is a 47 yr old male who presents today for follow up of his HTN.  Last visit clonidine was added to his regimen.  He was started on clonidine. He was bothered by pill burden and ultimately we decided to consolidate his meds into tribenzor and metoprolol.    He recently completed a stress test which was negative.    Review of Systems See HPI  Past Medical History  Diagnosis Date  . Hyperlipidemia   . Diabetes mellitus   . Hypertension   . OSA (obstructive sleep apnea)   . Arthritis   . Allergy   . Personal history of colonic polyps   . GERD (gastroesophageal reflux disease)   . Fatty liver 08/06/2011  . Bilateral varicoceles   . Plantar fasciitis     History   Social History  . Marital Status: Married    Spouse Name: N/A    Number of Children: 2  . Years of Education: N/A   Occupational History  . FOOTBALL COACH    Social History Main Topics  . Smoking status: Never Smoker   . Smokeless tobacco: Never Used  . Alcohol Use: No     Comment: occasional wine  . Drug Use: No  . Sexually Active: Not on file   Other Topics Concern  . Not on file   Social History Narrative   Regular exercise:  No   Caffeine use:  2--32oz cups daily          Past Surgical History  Procedure Laterality Date  . Knee surgery  08/04/08    Right knee-- medial meniscus repair, attenuation anterior cruciate [name] MD [facility])   . Ankle surgery    . Foot surgery    . Shoulder surgery    . Varicocele excision    . Plantar fascia release      Family History  Problem Relation Age of Onset  . Diabetes Mother   . Hypertension Mother   . Arthritis Mother   . Cancer Mother     uterine and breast cancer  . Hyperlipidemia Father   . Arthritis Father   . Cancer Father     prostate cancer  . Hyperlipidemia Maternal Grandmother   . Hypertension  Maternal Grandmother   . Hyperlipidemia Maternal Grandfather   . Hypertension Maternal Grandfather   . Hyperlipidemia Paternal Grandmother   . Hypertension Paternal Grandmother   . Hyperlipidemia Paternal Grandfather   . Hypertension Paternal Grandfather   . Heart attack Paternal Grandfather   . Sudden death Neg Hx   . Colon cancer Neg Hx   . Esophageal cancer Neg Hx   . Stomach cancer Neg Hx   . Rectal cancer Neg Hx     Allergies  Allergen Reactions  . Oxycodone-Acetaminophen Hives and Itching    Current Outpatient Prescriptions on File Prior to Visit  Medication Sig Dispense Refill  . aspirin 81 MG tablet Take 81 mg by mouth daily.        . Blood Glucose Monitoring Suppl (ONE TOUCH ULTRA 2) W/DEVICE KIT Use to check blood sugar once a day.  Dx 250.00  1 each  0  . cetirizine (ZYRTEC) 10 MG tablet Take 1 tablet (10 mg total) by mouth daily.  30 tablet  11  . fluticasone (FLONASE) 50 MCG/ACT nasal spray Place 1 spray into  the nose daily.      Marland Kitchen glucose blood (ONE TOUCH ULTRA TEST) test strip Use to check blood sugar once daily as instructed. Dx 250.00  50 each  2  . hydrochlorothiazide (HYDRODIURIL) 25 MG tablet Take 25 mg by mouth daily.       . Lancets (ONETOUCH ULTRASOFT) lancets Use to check blood sugar once daily as instructed.  Dx 250.00  100 each  1  . metFORMIN (GLUCOPHAGE) 500 MG tablet Take 1 tablet (500 mg total) by mouth 2 (two) times daily with a meal.  60 tablet  2  . Multiple Vitamin (MULTIVITAMIN) capsule Take 1 capsule by mouth daily.      . Olmesartan-Amlodipine-HCTZ (TRIBENZOR) 40-10-25 MG TABS Take 1 tablet by mouth daily.  7 tablet  0  . omeprazole (PRILOSEC) 20 MG capsule Take 2 capsules (40 mg total) by mouth every morning.  60 capsule  0  . potassium chloride (K-DUR,KLOR-CON) 10 MEQ tablet TAKE 1 TABLET (10 MEQ TOTAL) BY MOUTH DAILY.  30 tablet  5  . sildenafil (VIAGRA) 100 MG tablet Take 0.5-1 tablets (50-100 mg total) by mouth daily as needed.  3 tablet  5   . zolpidem (AMBIEN) 10 MG tablet Take 5 mg by mouth at bedtime as needed.      . [DISCONTINUED] potassium chloride (K-DUR) 10 MEQ tablet Take 1 tablet (10 mEq total) by mouth daily.  30 tablet  1   No current facility-administered medications on file prior to visit.    BP 136/90  Pulse 75  Temp(Src) 98.4 F (36.9 C) (Oral)  Resp 16  Ht 6\' 1"  (1.854 m)  Wt 322 lb 1.3 oz (146.095 kg)  BMI 42.5 kg/m2  SpO2 97%       Objective:   Physical Exam  Constitutional: He is oriented to person, place, and time. He appears well-developed and well-nourished. No distress.  HENT:  Head: Normocephalic and atraumatic.  Cardiovascular: Normal rate and regular rhythm.   No murmur heard. Pulmonary/Chest: Effort normal and breath sounds normal. No respiratory distress. He has no wheezes. He has no rales. He exhibits no tenderness.  Musculoskeletal: He exhibits no edema.  Neurological: He is alert and oriented to person, place, and time.  Psychiatric: He has a normal mood and affect. His behavior is normal. Judgment and thought content normal.          Assessment & Plan:

## 2012-05-26 NOTE — Assessment & Plan Note (Signed)
He continues to consider bariatric surgery- he attended a seminar. He would like to start monthly weight monitoring/diet discussion. We started today. He was advised on a 1500 calorie diet and exercise.

## 2012-05-26 NOTE — Assessment & Plan Note (Signed)
BP is improved on current regimen. Continue same, obtain bmet to check electrolytes.

## 2012-05-26 NOTE — Telephone Encounter (Signed)
Received call from pt stating  [facility]. Rx re-sent. Notified pt.

## 2012-05-27 LAB — LIPID PANEL
Cholesterol: 175 mg/dL (ref 0–200)
LDL Cholesterol: 107 mg/dL — ABNORMAL HIGH (ref 0–99)
Total CHOL/HDL Ratio: 3.4 Ratio
VLDL: 17 mg/dL (ref 0–40)

## 2012-05-27 LAB — HEPATIC FUNCTION PANEL
ALT: 97 U/L — ABNORMAL HIGH (ref 0–53)
Bilirubin, Direct: 0.2 mg/dL (ref 0.0–0.3)
Indirect Bilirubin: 0.6 mg/dL (ref 0.0–0.9)
Total Bilirubin: 0.8 mg/dL (ref 0.3–1.2)

## 2012-05-28 ENCOUNTER — Encounter: Payer: Self-pay | Admitting: Family

## 2012-05-31 ENCOUNTER — Telehealth: Payer: Self-pay | Admitting: *Deleted

## 2012-05-31 MED ORDER — OLMESARTAN-AMLODIPINE-HCTZ 40-10-25 MG PO TABS: 1.0000 | ORAL_TABLET | Freq: Every day | ORAL | Status: DC

## 2012-05-31 NOTE — Telephone Encounter (Signed)
Received call from pt stating Tribenzor will require prior authorization from [employer]. Pt has spoken to our [facility] and form has been requested. #14 samples placed at front desk to last pt until PA can be obtained.

## 2012-06-02 NOTE — Telephone Encounter (Signed)
Form received, completed and forwarded to Provider for signature.

## 2012-06-02 NOTE — Telephone Encounter (Signed)
Form signed.

## 2012-06-08 ENCOUNTER — Telehealth: Payer: Self-pay | Admitting: *Deleted

## 2012-06-08 NOTE — Telephone Encounter (Signed)
Spoke with a [employer] Rep concerning fax that was sent 06-03-12. She stated that it was never received and that we have the incorrect paperwork. She will be faxing the correct prior auth paperwork within the hour

## 2012-06-09 NOTE — Telephone Encounter (Signed)
Form was faxed on 06/01/12. Checked on status 06/08/12 and was told we had the incorrect form. Form was re-sent, completed again and faxed to [phone]. Spoke with Dewana at [employer] [phone] and verified that medication has been approved indefinitely. [facility]. Left detailed message on pt's cell# re: approval.

## 2012-06-09 NOTE — Telephone Encounter (Signed)
Case # per [facility] at [facility] is [id number]. Requested that she fax [place] documentation of approval below.

## 2012-06-15 ENCOUNTER — Telehealth: Payer: Self-pay | Admitting: Family

## 2012-06-15 MED ORDER — OLMESARTAN-AMLODIPINE-HCTZ 40-10-25 MG PO TABS: 1.0000 | ORAL_TABLET | Freq: Every day | ORAL | Status: DC

## 2012-06-15 NOTE — Telephone Encounter (Signed)
New prescription request  tribenzor tabs 40/10/25. Qty 90 days supply

## 2012-06-15 NOTE — Telephone Encounter (Signed)
Refill sent.

## 2012-07-12 ENCOUNTER — Encounter: Payer: Self-pay | Admitting: Family

## 2012-07-12 ENCOUNTER — Ambulatory Visit (INDEPENDENT_AMBULATORY_CARE_PROVIDER_SITE_OTHER): Admitting: Family

## 2012-07-12 VITALS — BP 144/100 | HR 76 | Temp 98.6°F | Resp 16 | Ht 73.0 in | Wt 331.0 lb

## 2012-07-12 DIAGNOSIS — IMO0002 Reserved for concepts with insufficient information to code with codable children: Secondary | ICD-10-CM

## 2012-07-12 DIAGNOSIS — E119 Type 2 diabetes mellitus without complications: Secondary | ICD-10-CM

## 2012-07-12 DIAGNOSIS — N50819 Testicular pain, unspecified: Secondary | ICD-10-CM

## 2012-07-12 DIAGNOSIS — N509 Disorder of male genital organs, unspecified: Secondary | ICD-10-CM

## 2012-07-12 DIAGNOSIS — N5082 Scrotal pain: Secondary | ICD-10-CM

## 2012-07-12 DIAGNOSIS — I10 Essential (primary) hypertension: Secondary | ICD-10-CM

## 2012-07-12 LAB — HEMOGLOBIN A1C
Hgb A1c MFr Bld: 6.5 % — ABNORMAL HIGH (ref <5.7)
Mean Plasma Glucose: 140 mg/dL — ABNORMAL HIGH (ref <117)

## 2012-07-12 LAB — PSA: PSA: 1.2 ng/mL (ref <=4.00)

## 2012-07-12 NOTE — Assessment & Plan Note (Signed)
Check PSA, obtain testicular ultrasound.

## 2012-07-12 NOTE — Assessment & Plan Note (Signed)
BP up today in office.  I have asked him to continue to monitor his BP at home and bring his meter with him next visit so we can compare readings to office cuff.

## 2012-07-12 NOTE — Patient Instructions (Addendum)
Please schedule ultrasound on the first floor. Follow up in 1 month.

## 2012-07-12 NOTE — Progress Notes (Signed)
Subjective:    Patient ID: Charles Nash, male    DOB: Oct 14, 1965, 47 y.o.   MRN: [id number]  HPI  Charles Nash is a 47 yr old male who presents today with chief complaint of testicular pain. Pain is in both testicles.  Reports that it is time for his annual exam.  Pain started getting sore 2 months ago- denies redness/soreness/fever.  Pain is worse with standing.    Pt reports that his BP at home is much lower than the readings that he gets here in the office.  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   Review of Systems See HPI  Past Medical History  Diagnosis Date  . Hyperlipidemia   . Diabetes mellitus   . Hypertension   . OSA (obstructive sleep apnea)   . Arthritis   . Allergy   . Personal history of colonic polyps   . GERD (gastroesophageal reflux disease)   . Fatty liver 08/06/2011  . Bilateral varicoceles   . Plantar fasciitis     History   Social History  . Marital Status: Married    Spouse Name: N/A    Number of Children: 2  . Years of Education: N/A   Occupational History  . FOOTBALL COACH    Social History Main Topics  . Smoking status: Never Smoker   . Smokeless tobacco: Never Used  . Alcohol Use: No     Comment: occasional wine  . Drug Use: No  . Sexually Active: Not on file   Other Topics Concern  . Not on file   Social History Narrative   Regular exercise:  No   Caffeine use:  2--32oz cups daily          Past Surgical History  Procedure Laterality Date  . Knee surgery  08/04/08    Right knee-- medial meniscus repair, attenuation anterior cruciate [name] MD [facility])   . Ankle surgery    . Foot surgery    . Shoulder surgery    . Varicocele excision    . Plantar fascia release      Family History  Problem Relation Age of Onset  . Diabetes Mother   . Hypertension Mother   . Arthritis Mother   . Cancer Mother     uterine and breast cancer  .  Hyperlipidemia Father   . Arthritis Father   . Cancer Father     prostate cancer  . Hyperlipidemia Maternal Grandmother   . Hypertension Maternal Grandmother   . Hyperlipidemia Maternal Grandfather   . Hypertension Maternal Grandfather   . Hyperlipidemia Paternal Grandmother   . Hypertension Paternal Grandmother   . Hyperlipidemia Paternal Grandfather   . Hypertension Paternal Grandfather   . Heart attack Paternal Grandfather   . Sudden death Neg Hx   . Colon cancer Neg Hx   . Esophageal cancer Neg Hx   . Stomach cancer Neg Hx   . Rectal cancer Neg Hx     Allergies  Allergen Reactions  . Oxycodone-Acetaminophen Hives and Itching    Current Outpatient Prescriptions on File Prior to Visit  Medication Sig Dispense Refill  . aspirin 81 MG tablet Take 81 mg by mouth daily.        . Blood Glucose Monitoring Suppl (ONE TOUCH ULTRA 2) W/DEVICE KIT Use to check blood sugar once a day.  Dx 250.00  1 each  0  . cetirizine (ZYRTEC) 10  MG tablet Take 1 tablet (10 mg total) by mouth daily.  30 tablet  11  . fluticasone (FLONASE) 50 MCG/ACT nasal spray Place 1 spray into the nose daily.      Marland Kitchen glucose blood (ONE TOUCH ULTRA TEST) test strip Use to check blood sugar once daily as instructed. Dx 250.00  50 each  2  . hydrochlorothiazide (HYDRODIURIL) 25 MG tablet Take 25 mg by mouth daily.       . Lancets (ONETOUCH ULTRASOFT) lancets Use to check blood sugar once daily as instructed.  Dx 250.00  100 each  1  . metFORMIN (GLUCOPHAGE) 500 MG tablet Take 1 tablet (500 mg total) by mouth 2 (two) times daily with a meal.  60 tablet  2  . metoprolol (LOPRESSOR) 50 MG tablet Take 50 mg by mouth 2 (two) times daily.      . Multiple Vitamin (MULTIVITAMIN) capsule Take 1 capsule by mouth daily.      . Olmesartan-Amlodipine-HCTZ (TRIBENZOR) 40-10-25 MG TABS Take 1 tablet by mouth daily.  90 tablet  1  . omeprazole (PRILOSEC) 20 MG capsule Take 2 capsules (40 mg total) by mouth every morning.  60 capsule  0   . potassium chloride (K-DUR,KLOR-CON) 10 MEQ tablet TAKE 1 TABLET (10 MEQ TOTAL) BY MOUTH DAILY.  30 tablet  5  . sildenafil (VIAGRA) 100 MG tablet Take 0.5-1 tablets (50-100 mg total) by mouth daily as needed.  3 tablet  5  . zolpidem (AMBIEN) 10 MG tablet Take 5 mg by mouth at bedtime as needed.      . [DISCONTINUED] potassium chloride (K-DUR) 10 MEQ tablet Take 1 tablet (10 mEq total) by mouth daily.  30 tablet  1   No current facility-administered medications on file prior to visit.    BP 144/100  Pulse 76  Temp(Src) 98.6 F (37 C) (Oral)  Resp 16  Ht 6\' 1"  (1.854 m)  Wt 331 lb 0.6 oz (150.159 kg)  BMI 43.68 kg/m2  SpO2 97%        Objective:   Physical Exam  Constitutional: He is oriented to person, place, and time. He appears well-developed and well-nourished. No distress.  Cardiovascular: Normal rate and regular rhythm.   No murmur heard. Pulmonary/Chest: Effort normal and breath sounds normal. No respiratory distress. He has no wheezes. He has no rales. He exhibits no tenderness.  Abdominal: Hernia confirmed negative in the right inguinal area and confirmed negative in the left inguinal area.  Genitourinary: Testes normal and penis normal. Right testis shows no mass, no swelling and no tenderness. Left testis shows no mass, no swelling and no tenderness. Circumcised.  Musculoskeletal: He exhibits no edema.  Neurological: He is alert and oriented to person, place, and time.  Psychiatric: He has a normal mood and affect. His behavior is normal. Judgment and thought content normal.          Assessment & Plan:

## 2012-07-13 ENCOUNTER — Encounter: Payer: Self-pay | Admitting: Family

## 2012-07-14 ENCOUNTER — Telehealth: Payer: Self-pay | Admitting: Family

## 2012-07-14 ENCOUNTER — Ambulatory Visit (HOSPITAL_BASED_OUTPATIENT_CLINIC_OR_DEPARTMENT_OTHER)
Admission: RE | Admit: 2012-07-14 | Discharge: 2012-07-14 | Disposition: A | Discharge status: Home / Self Care | Source: Ambulatory Visit | Attending: Family | Admitting: Family

## 2012-07-14 DIAGNOSIS — N50819 Testicular pain, unspecified: Secondary | ICD-10-CM

## 2012-07-14 DIAGNOSIS — I861 Scrotal varices: Secondary | ICD-10-CM | POA: Insufficient documentation

## 2012-07-14 DIAGNOSIS — N509 Disorder of male genital organs, unspecified: Secondary | ICD-10-CM | POA: Insufficient documentation

## 2012-07-14 DIAGNOSIS — IMO0002 Reserved for concepts with insufficient information to code with codable children: Secondary | ICD-10-CM

## 2012-07-14 NOTE — Telephone Encounter (Signed)
Reviewed [place], will refer to Urology.  Let detailed message on Cell.

## 2012-08-09 ENCOUNTER — Encounter: Payer: Self-pay | Admitting: Family

## 2012-08-09 ENCOUNTER — Ambulatory Visit (INDEPENDENT_AMBULATORY_CARE_PROVIDER_SITE_OTHER): Admitting: Family

## 2012-08-09 VITALS — BP 138/98 | HR 88 | Temp 98.0°F | Resp 16 | Ht 73.0 in | Wt 337.1 lb

## 2012-08-09 DIAGNOSIS — G4733 Obstructive sleep apnea (adult) (pediatric): Secondary | ICD-10-CM

## 2012-08-09 DIAGNOSIS — K219 Gastro-esophageal reflux disease without esophagitis: Secondary | ICD-10-CM

## 2012-08-09 DIAGNOSIS — I10 Essential (primary) hypertension: Secondary | ICD-10-CM

## 2012-08-09 DIAGNOSIS — E041 Nontoxic single thyroid nodule: Secondary | ICD-10-CM

## 2012-08-09 LAB — TSH: TSH: 0.535 u[IU]/mL (ref 0.350–4.500)

## 2012-08-09 LAB — BASIC METABOLIC PANEL
BUN: 8 mg/dL (ref 6–23)
Chloride: 102 mEq/L (ref 96–112)
Potassium: 3.9 mEq/L (ref 3.5–5.3)

## 2012-08-09 MED ORDER — OLMESARTAN-AMLODIPINE-HCTZ 40-10-25 MG PO TABS: 1.0000 | ORAL_TABLET | Freq: Every day | ORAL | Status: DC

## 2012-08-09 MED ORDER — OMEPRAZOLE 20 MG PO CPDR: 40.0000 mg | DELAYED_RELEASE_CAPSULE | ORAL | Status: DC

## 2012-08-09 MED ORDER — SILDENAFIL CITRATE 100 MG PO TABS: 50.0000 mg | ORAL_TABLET | Freq: Every day | ORAL | Status: DC | PRN

## 2012-08-09 MED ORDER — METOPROLOL TARTRATE 50 MG PO TABS: 50.0000 mg | ORAL_TABLET | Freq: Two times a day (BID) | ORAL | Status: DC

## 2012-08-09 NOTE — Assessment & Plan Note (Signed)
Stable on PPI, continue same.  

## 2012-08-09 NOTE — Assessment & Plan Note (Signed)
Stable on CPAP.  Continue same.  

## 2012-08-09 NOTE — Progress Notes (Signed)
Subjective:    Patient ID: Charles Nash, male    DOB: [birth year], 47 y.o.   MRN: [id number]  HPI  Charles Nash is a 47 yr old male who presents today for follow up.  1) HTN-  Denies SOB, CP or swelling.  2) OSA- he reports + compliance with CPAP.  3) GERD-  Reports that GERD is well controlled on prilosec.     Review of Systems See HPI  Past Medical History  Diagnosis Date  . Hyperlipidemia   . Diabetes mellitus   . Hypertension   . OSA (obstructive sleep apnea)   . Arthritis   . Allergy   . Personal history of colonic polyps   . GERD (gastroesophageal reflux disease)   . Fatty liver 08/06/2011  . Bilateral varicoceles   . Plantar fasciitis     History   Social History  . Marital Status: Married    Spouse Name: N/A    Number of Children: 2  . Years of Education: N/A   Occupational History  . FOOTBALL COACH    Social History Main Topics  . Smoking status: Never Smoker   . Smokeless tobacco: Never Used  . Alcohol Use: No     Comment: occasional wine  . Drug Use: No  . Sexually Active: Not on file   Other Topics Concern  . Not on file   Social History Narrative   Regular exercise:  No   Caffeine use:  2--32oz cups daily          Past Surgical History  Procedure Laterality Date  . Knee surgery  08/04/08    Right knee-- medial meniscus repair, attenuation anterior cruciate [name] MD [facility])   . Ankle surgery    . Foot surgery    . Shoulder surgery    . Varicocele excision    . Plantar fascia release      Family History  Problem Relation Age of Onset  . Diabetes Mother   . Hypertension Mother   . Arthritis Mother   . Cancer Mother     uterine and breast cancer  . Hyperlipidemia Father   . Arthritis Father   . Cancer Father     prostate cancer  . Hyperlipidemia Maternal Grandmother   . Hypertension Maternal Grandmother   . Hyperlipidemia Maternal Grandfather   . Hypertension Maternal Grandfather   .  Hyperlipidemia Paternal Grandmother   . Hypertension Paternal Grandmother   . Hyperlipidemia Paternal Grandfather   . Hypertension Paternal Grandfather   . Heart attack Paternal Grandfather   . Sudden death Neg Hx   . Colon cancer Neg Hx   . Esophageal cancer Neg Hx   . Stomach cancer Neg Hx   . Rectal cancer Neg Hx     Allergies  Allergen Reactions  . Oxycodone-Acetaminophen Hives and Itching    Current Outpatient Prescriptions on File Prior to Visit  Medication Sig Dispense Refill  . aspirin 81 MG tablet Take 81 mg by mouth daily.        . Blood Glucose Monitoring Suppl (ONE TOUCH ULTRA 2) W/DEVICE KIT Use to check blood sugar once a day.  Dx 250.00  1 each  0  . cetirizine (ZYRTEC) 10 MG tablet Take 1 tablet (10 mg total) by mouth daily.  30 tablet  11  . fluticasone (FLONASE) 50 MCG/ACT nasal spray Place 1 spray into the nose daily.      Marland Kitchen glucose blood (ONE TOUCH ULTRA TEST) test strip  Use to check blood sugar once daily as instructed. Dx 250.00  50 each  2  . Lancets (ONETOUCH ULTRASOFT) lancets Use to check blood sugar once daily as instructed.  Dx 250.00  100 each  1  . Multiple Vitamin (MULTIVITAMIN) capsule Take 1 capsule by mouth daily.      Marland Kitchen PAROXETINE HCL PO Take 1 tablet by mouth daily.      . potassium chloride (K-DUR,KLOR-CON) 10 MEQ tablet TAKE 1 TABLET (10 MEQ TOTAL) BY MOUTH DAILY.  30 tablet  5  . zolpidem (AMBIEN) 10 MG tablet Take 5 mg by mouth at bedtime as needed.      . [DISCONTINUED] potassium chloride (K-DUR) 10 MEQ tablet Take 1 tablet (10 mEq total) by mouth daily.  30 tablet  1   No current facility-administered medications on file prior to visit.    BP 138/98  Pulse 88  Temp(Src) 98 F (36.7 C) (Oral)  Resp 16  Ht 6\' 1"  (1.854 m)  Wt 337 lb 1.3 oz (152.898 kg)  BMI 44.48 kg/m2  SpO2 98%       Objective:   Physical Exam  Constitutional: He appears well-developed and well-nourished. No distress.  HENT:  Head: Normocephalic and  atraumatic.  Cardiovascular: Normal rate and regular rhythm.   No murmur heard. Pulmonary/Chest: Effort normal and breath sounds normal. No respiratory distress. He has no wheezes. He has no rales. He exhibits no tenderness.  Abdominal: Soft. Bowel sounds are normal.  Lymphadenopathy:    He has no cervical adenopathy.  Neurological: He is alert.  Skin: Skin is warm and dry.  Psychiatric: He has a normal mood and affect. His behavior is normal. Judgment and thought content normal.          Assessment & Plan:

## 2012-08-09 NOTE — Patient Instructions (Addendum)
Please complete your lab work prior to leaving. Please schedule a follow up appointment in 3 months.  

## 2012-08-09 NOTE — Assessment & Plan Note (Signed)
BP is up in office today, but he brings meter with him today which reveals home BP's have been stable 120-130/60-80's.  Continue tribenzor and metoprolol.  Obtain BMET.

## 2012-09-09 ENCOUNTER — Telehealth: Payer: Self-pay | Admitting: *Deleted

## 2012-09-09 NOTE — Telephone Encounter (Signed)
Received message from pt requesting a return call re: metoprolol. Attempted to reach pt and left message to return my call.

## 2012-09-10 ENCOUNTER — Encounter: Payer: Self-pay | Admitting: Family

## 2012-09-10 ENCOUNTER — Ambulatory Visit (INDEPENDENT_AMBULATORY_CARE_PROVIDER_SITE_OTHER): Admitting: Family

## 2012-09-10 VITALS — BP 144/94 | HR 76 | Temp 98.5°F | Resp 16 | Wt 334.1 lb

## 2012-09-10 DIAGNOSIS — I10 Essential (primary) hypertension: Secondary | ICD-10-CM

## 2012-09-10 NOTE — Telephone Encounter (Signed)
Concerns discussed with pt at appt today.

## 2012-09-10 NOTE — Assessment & Plan Note (Signed)
Overall BP is improved on lower dose of metoprolol. Will plan 25mg  bid of metoprolol along with tribenzor.

## 2012-09-10 NOTE — Assessment & Plan Note (Signed)
Recommended that he try keeping track of calories and exercise on myfitness pal.  Continue healthy diet, water aerobics.

## 2012-09-10 NOTE — Patient Instructions (Signed)
Cut metoprolol in half and take 25mg  twice daily. Call if you have BP < 90/50 or >160/100.   Follow up in 2 months.

## 2012-09-10 NOTE — Progress Notes (Signed)
Subjective:    Patient ID: Charles Nash, male    DOB: 1966-01-27, 47 y.o.   MRN: [id number]  HPI  [name] is a 47 yr old male who presents today for follow up of his HTN.  He reports low bp's at home (SPB in the 80's).  As a result he discontinued his HS dose of metoprolol.  He continues tribenzor. Reports that he has started water aerobics.  He finds that when he exercises BP is lower.   Obesity- He reports that he has improved his diet.  He is only eating grilled chicken and grilled fish. He has lost 3 pounds since last visit.   Review of Systems See HPI  Past Medical History  Diagnosis Date  . Hyperlipidemia   . Diabetes mellitus   . Hypertension   . OSA (obstructive sleep apnea)   . Arthritis   . Allergy   . Personal history of colonic polyps   . GERD (gastroesophageal reflux disease)   . Fatty liver 08/06/2011  . Bilateral varicoceles   . Plantar fasciitis     History   Social History  . Marital Status: Married    Spouse Name: N/A    Number of Children: 2  . Years of Education: N/A   Occupational History  . FOOTBALL COACH    Social History Main Topics  . Smoking status: Never Smoker   . Smokeless tobacco: Never Used  . Alcohol Use: No     Comment: occasional wine  . Drug Use: No  . Sexually Active: Not on file   Other Topics Concern  . Not on file   Social History Narrative   Regular exercise:  No   Caffeine use:  2--32oz cups daily          Past Surgical History  Procedure Laterality Date  . Knee surgery  08/04/08    Right knee-- medial meniscus repair, attenuation anterior cruciate [name] MD [facility])   . Ankle surgery    . Foot surgery    . Shoulder surgery    . Varicocele excision    . Plantar fascia release      Family History  Problem Relation Age of Onset  . Diabetes Mother   . Hypertension Mother   . Arthritis Mother   . Cancer Mother     uterine and breast cancer  . Hyperlipidemia Father   .  Arthritis Father   . Cancer Father     prostate cancer  . Hyperlipidemia Maternal Grandmother   . Hypertension Maternal Grandmother   . Hyperlipidemia Maternal Grandfather   . Hypertension Maternal Grandfather   . Hyperlipidemia Paternal Grandmother   . Hypertension Paternal Grandmother   . Hyperlipidemia Paternal Grandfather   . Hypertension Paternal Grandfather   . Heart attack Paternal Grandfather   . Sudden death Neg Hx   . Colon cancer Neg Hx   . Esophageal cancer Neg Hx   . Stomach cancer Neg Hx   . Rectal cancer Neg Hx     Allergies  Allergen Reactions  . Oxycodone-Acetaminophen Hives and Itching    Current Outpatient Prescriptions on File Prior to Visit  Medication Sig Dispense Refill  . aspirin 81 MG tablet Take 81 mg by mouth daily.        . Blood Glucose Monitoring Suppl (ONE TOUCH ULTRA 2) W/DEVICE KIT Use to check blood sugar once a day.  Dx 250.00  1 each  0  . cetirizine (ZYRTEC) 10 MG tablet Take  1 tablet (10 mg total) by mouth daily.  30 tablet  11  . fluticasone (FLONASE) 50 MCG/ACT nasal spray Place 1 spray into the nose daily.      Marland Kitchen glucose blood (ONE TOUCH ULTRA TEST) test strip Use to check blood sugar once daily as instructed. Dx 250.00  50 each  2  . Lancets (ONETOUCH ULTRASOFT) lancets Use to check blood sugar once daily as instructed.  Dx 250.00  100 each  1  . Multiple Vitamin (MULTIVITAMIN) capsule Take 1 capsule by mouth daily.      . Olmesartan-Amlodipine-HCTZ (TRIBENZOR) 40-10-25 MG TABS Take 1 tablet by mouth daily.  90 tablet  1  . omeprazole (PRILOSEC) 20 MG capsule Take 2 capsules (40 mg total) by mouth every morning.  180 capsule  1  . PAROXETINE HCL PO Take 1 tablet by mouth daily.      . potassium chloride (K-DUR,KLOR-CON) 10 MEQ tablet TAKE 1 TABLET (10 MEQ TOTAL) BY MOUTH DAILY.  30 tablet  5  . sildenafil (VIAGRA) 100 MG tablet Take 0.5-1 tablets (50-100 mg total) by mouth daily as needed.  15 tablet  1  . zolpidem (AMBIEN) 10 MG tablet  Take 5 mg by mouth at bedtime as needed.      . [DISCONTINUED] potassium chloride (K-DUR) 10 MEQ tablet Take 1 tablet (10 mEq total) by mouth daily.  30 tablet  1   No current facility-administered medications on file prior to visit.    BP 144/94  Pulse 76  Temp(Src) 98.5 F (36.9 C) (Oral)  Resp 16  Wt 334 lb 1.3 oz (151.538 kg)  BMI 44.09 kg/m2  SpO2 99%       Objective:   Physical Exam  Constitutional: He appears well-developed and well-nourished. No distress.  Cardiovascular: Normal rate and regular rhythm.   No murmur heard. Pulmonary/Chest: Effort normal and breath sounds normal. No respiratory distress. He has no wheezes. He has no rales. He exhibits no tenderness.  Musculoskeletal: He exhibits no edema.  Psychiatric: He has a normal mood and affect. His behavior is normal. Judgment and thought content normal.          Assessment & Plan:

## 2012-09-21 ENCOUNTER — Encounter: Payer: Self-pay | Admitting: Family

## 2012-09-21 ENCOUNTER — Ambulatory Visit (INDEPENDENT_AMBULATORY_CARE_PROVIDER_SITE_OTHER): Admitting: Family

## 2012-09-21 VITALS — BP 140/96 | HR 74 | Temp 98.4°F | Resp 18 | Wt 337.1 lb

## 2012-09-21 DIAGNOSIS — S239XXA Sprain of unspecified parts of thorax, initial encounter: Secondary | ICD-10-CM

## 2012-09-21 DIAGNOSIS — S29019A Strain of muscle and tendon of unspecified wall of thorax, initial encounter: Secondary | ICD-10-CM

## 2012-09-21 DIAGNOSIS — S29012A Strain of muscle and tendon of back wall of thorax, initial encounter: Secondary | ICD-10-CM | POA: Insufficient documentation

## 2012-09-21 MED ORDER — METHYLPREDNISOLONE 4 MG PO KIT: PACK | ORAL | Status: DC

## 2012-09-21 NOTE — Assessment & Plan Note (Signed)
Trial of medrol dose pak + HS prednisone.

## 2012-09-21 NOTE — Patient Instructions (Addendum)
Please call if symptoms worsen or if no improvement in 1 week.

## 2012-09-21 NOTE — Progress Notes (Signed)
Subjective:    Patient ID: Charles Nash, male    DOB: 1965/08/06, 47 y.o.   MRN: [id number]  HPI  [name] is a 47 yr old male who presents today with chief complaint of back pain. Pain started last Thursday and is located on the left upper side of the back.  Pain is worsened with bending forward.  Pain is sharp and sudden.  Pain is is non-radiating.  He denies associated nausea, vomitting, fever, hematuria, dysuria, or  abdominal pain.  He has had previous hx of back pain. But this pain is worse this time. Pt has tried motrin and flexeril without improvement.   Review of Systems    see HPI  Past Medical History  Diagnosis Date  . Hyperlipidemia   . Diabetes mellitus   . Hypertension   . OSA (obstructive sleep apnea)   . Arthritis   . Allergy   . Personal history of colonic polyps   . GERD (gastroesophageal reflux disease)   . Fatty liver 08/06/2011  . Bilateral varicoceles   . Plantar fasciitis     History   Social History  . Marital Status: Married    Spouse Name: N/A    Number of Children: 2  . Years of Education: N/A   Occupational History  . FOOTBALL COACH    Social History Main Topics  . Smoking status: Never Smoker   . Smokeless tobacco: Never Used  . Alcohol Use: No     Comment: occasional wine  . Drug Use: No  . Sexually Active: Not on file   Other Topics Concern  . Not on file   Social History Narrative   Regular exercise:  No   Caffeine use:  2--32oz cups daily          Past Surgical History  Procedure Laterality Date  . Knee surgery  08/04/08    Right knee-- medial meniscus repair, attenuation anterior cruciate [name] MD [facility])   . Ankle surgery    . Foot surgery    . Shoulder surgery    . Varicocele excision    . Plantar fascia release      Family History  Problem Relation Age of Onset  . Diabetes Mother   . Hypertension Mother   . Arthritis Mother   . Cancer Mother     uterine and breast cancer  .  Hyperlipidemia Father   . Arthritis Father   . Cancer Father     prostate cancer  . Hyperlipidemia Maternal Grandmother   . Hypertension Maternal Grandmother   . Hyperlipidemia Maternal Grandfather   . Hypertension Maternal Grandfather   . Hyperlipidemia Paternal Grandmother   . Hypertension Paternal Grandmother   . Hyperlipidemia Paternal Grandfather   . Hypertension Paternal Grandfather   . Heart attack Paternal Grandfather   . Sudden death Neg Hx   . Colon cancer Neg Hx   . Esophageal cancer Neg Hx   . Stomach cancer Neg Hx   . Rectal cancer Neg Hx     Allergies  Allergen Reactions  . Oxycodone-Acetaminophen Hives and Itching    Current Outpatient Prescriptions on File Prior to Visit  Medication Sig Dispense Refill  . aspirin 81 MG tablet Take 81 mg by mouth daily.        . Blood Glucose Monitoring Suppl (ONE TOUCH ULTRA 2) W/DEVICE KIT Use to check blood sugar once a day.  Dx 250.00  1 each  0  . cetirizine (ZYRTEC) 10 MG tablet  Take 1 tablet (10 mg total) by mouth daily.  30 tablet  11  . fluticasone (FLONASE) 50 MCG/ACT nasal spray Place 1 spray into the nose daily.      Marland Kitchen glucose blood (ONE TOUCH ULTRA TEST) test strip Use to check blood sugar once daily as instructed. Dx 250.00  50 each  2  . Lancets (ONETOUCH ULTRASOFT) lancets Use to check blood sugar once daily as instructed.  Dx 250.00  100 each  1  . metoprolol (LOPRESSOR) 50 MG tablet Take 25 mg by mouth 2 (two) times daily.      . Multiple Vitamin (MULTIVITAMIN) capsule Take 1 capsule by mouth daily.      . Olmesartan-Amlodipine-HCTZ (TRIBENZOR) 40-10-25 MG TABS Take 1 tablet by mouth daily.  90 tablet  1  . omeprazole (PRILOSEC) 20 MG capsule Take 2 capsules (40 mg total) by mouth every morning.  180 capsule  1  . PAROXETINE HCL PO Take 1 tablet by mouth daily.      . potassium chloride (K-DUR,KLOR-CON) 10 MEQ tablet TAKE 1 TABLET (10 MEQ TOTAL) BY MOUTH DAILY.  30 tablet  5  . sildenafil (VIAGRA) 100 MG tablet  Take 0.5-1 tablets (50-100 mg total) by mouth daily as needed.  15 tablet  1  . zolpidem (AMBIEN) 10 MG tablet Take 5 mg by mouth at bedtime as needed.      . [DISCONTINUED] potassium chloride (K-DUR) 10 MEQ tablet Take 1 tablet (10 mEq total) by mouth daily.  30 tablet  1   No current facility-administered medications on file prior to visit.    BP 140/96  Pulse 74  Temp(Src) 98.4 F (36.9 C) (Oral)  Resp 18  Wt 337 lb 1.3 oz (152.898 kg)  BMI 44.48 kg/m2  SpO2 99%    Objective:   Physical Exam  Constitutional: He appears well-developed and well-nourished. No distress.  Cardiovascular: Normal rate and regular rhythm.   No murmur heard. Pulmonary/Chest: Effort normal and breath sounds normal. No respiratory distress. He has no wheezes. He has no rales. He exhibits no tenderness.  Abdominal: Soft.  Musculoskeletal:       Cervical back: He exhibits no tenderness.       Thoracic back: He exhibits no tenderness.       Lumbar back: He exhibits no tenderness.          Assessment & Plan:

## 2012-10-21 ENCOUNTER — Other Ambulatory Visit: Payer: Self-pay

## 2012-11-10 ENCOUNTER — Telehealth: Payer: Self-pay | Admitting: *Deleted

## 2012-11-10 NOTE — Telephone Encounter (Signed)
Received message from pt on 11/02/12 reports glucose readings. Reports BS was previously running 80's to 130s then has had readings from 153 to 166. Pt wondering if he should make any medication changes?  Please advise.

## 2012-11-10 NOTE — Telephone Encounter (Signed)
Notified pt and scheduled follow up for 11/17/12 at 10:45am. Pt states glucosed readings are back down around 130s.

## 2012-11-10 NOTE — Telephone Encounter (Signed)
He is due for follow up please. We can check A1C at that time and see if we need to add any additional meds at that time.

## 2012-11-17 ENCOUNTER — Encounter: Payer: Self-pay | Admitting: Family

## 2012-11-17 ENCOUNTER — Ambulatory Visit (INDEPENDENT_AMBULATORY_CARE_PROVIDER_SITE_OTHER): Admitting: Family

## 2012-11-17 VITALS — BP 152/96 | HR 82 | Temp 97.9°F | Resp 16 | Wt 336.0 lb

## 2012-11-17 DIAGNOSIS — M25579 Pain in unspecified ankle and joints of unspecified foot: Secondary | ICD-10-CM

## 2012-11-17 DIAGNOSIS — M25571 Pain in right ankle and joints of right foot: Secondary | ICD-10-CM

## 2012-11-17 DIAGNOSIS — E119 Type 2 diabetes mellitus without complications: Secondary | ICD-10-CM

## 2012-11-17 DIAGNOSIS — M79673 Pain in unspecified foot: Secondary | ICD-10-CM | POA: Insufficient documentation

## 2012-11-17 DIAGNOSIS — M79609 Pain in unspecified limb: Secondary | ICD-10-CM

## 2012-11-17 DIAGNOSIS — I10 Essential (primary) hypertension: Secondary | ICD-10-CM

## 2012-11-17 DIAGNOSIS — M79671 Pain in right foot: Secondary | ICD-10-CM

## 2012-11-17 MED ORDER — NIACIN ER 500 MG PO TBCR: 500.0000 mg | EXTENDED_RELEASE_TABLET | Freq: Every day | ORAL | Status: DC

## 2012-11-17 MED ORDER — ZOLPIDEM TARTRATE 10 MG PO TABS: 5.0000 mg | ORAL_TABLET | Freq: Every evening | ORAL | Status: DC | PRN

## 2012-11-17 MED ORDER — SILDENAFIL CITRATE 100 MG PO TABS: 50.0000 mg | ORAL_TABLET | Freq: Every day | ORAL | Status: DC | PRN

## 2012-11-17 NOTE — Patient Instructions (Addendum)
Please complete lab work prior to leaving. Increase metoprolol from 25mg  to 50mg  (full tab) twice daily. Follow up in 1 month for BP check.

## 2012-11-17 NOTE — Assessment & Plan Note (Signed)
?   Gout, ? Plantar fasciitis, ? OA, recommended ice prn, tylenol prn, obtain uric acid level for gout evaluation.

## 2012-11-17 NOTE — Assessment & Plan Note (Signed)
Uncontrolled.  Increase metoprolol from 25mg  to 50mg  twice daily.  Continue tribenzor.

## 2012-11-17 NOTE — Progress Notes (Signed)
Subjective:    Patient ID: Charles Nash, male    DOB: Oct 29, 1965, 47 y.o.   MRN: [id number]  HPI  Charles Nash is a 48 yr old male who presents today for follow up.  1) HTN- currently maintained on metoprolol, and tribenzor.   2) Dm2- he is currently maintained on diet control only.  Reports pneumovax at the [place].   3) Heel pain- right heel.  He reports that he has hx of plantar fasciitis and has had surgery.  R heel started to hurt 1 week ago.  Worsened when you press on the posterolateral heel.  Not worsened by weight bearing. Denies known injury.  Review of Systems See HPI  Past Medical History  Diagnosis Date  . Hyperlipidemia   . Diabetes mellitus   . Hypertension   . OSA (obstructive sleep apnea)   . Arthritis   . Allergy   . Personal history of colonic polyps   . GERD (gastroesophageal reflux disease)   . Fatty liver 08/06/2011  . Bilateral varicoceles   . Plantar fasciitis     History   Social History  . Marital Status: Married    Spouse Name: N/A    Number of Children: 2  . Years of Education: N/A   Occupational History  . FOOTBALL COACH    Social History Main Topics  . Smoking status: Never Smoker   . Smokeless tobacco: Never Used  . Alcohol Use: No     Comment: occasional wine  . Drug Use: No  . Sexually Active: Not on file   Other Topics Concern  . Not on file   Social History Narrative   Regular exercise:  No   Caffeine use:  2--32oz cups daily          Past Surgical History  Procedure Laterality Date  . Knee surgery  08/04/08    Right knee-- medial meniscus repair, attenuation anterior cruciate [name] MD [facility])   . Ankle surgery    . Foot surgery    . Shoulder surgery    . Varicocele excision    . Plantar fascia release      Family History  Problem Relation Age of Onset  . Diabetes Mother   . Hypertension Mother   . Arthritis Mother   . Cancer Mother     uterine and breast cancer  . Hyperlipidemia  Father   . Arthritis Father   . Cancer Father     prostate cancer  . Hyperlipidemia Maternal Grandmother   . Hypertension Maternal Grandmother   . Hyperlipidemia Maternal Grandfather   . Hypertension Maternal Grandfather   . Hyperlipidemia Paternal Grandmother   . Hypertension Paternal Grandmother   . Hyperlipidemia Paternal Grandfather   . Hypertension Paternal Grandfather   . Heart attack Paternal Grandfather   . Sudden death Neg Hx   . Colon cancer Neg Hx   . Esophageal cancer Neg Hx   . Stomach cancer Neg Hx   . Rectal cancer Neg Hx     Allergies  Allergen Reactions  . Oxycodone-Acetaminophen Hives and Itching    Current Outpatient Prescriptions on File Prior to Visit  Medication Sig Dispense Refill  . aspirin 81 MG tablet Take 81 mg by mouth daily.        . Blood Glucose Monitoring Suppl (ONE TOUCH ULTRA 2) W/DEVICE KIT Use to check blood sugar once a day.  Dx 250.00  1 each  0  . cetirizine (ZYRTEC) 10 MG tablet Take 1  tablet (10 mg total) by mouth daily.  30 tablet  11  . fluticasone (FLONASE) 50 MCG/ACT nasal spray Place 1 spray into the nose daily.      Marland Kitchen glucose blood (ONE TOUCH ULTRA TEST) test strip Use to check blood sugar once daily as instructed. Dx 250.00  50 each  2  . Lancets (ONETOUCH ULTRASOFT) lancets Use to check blood sugar once daily as instructed.  Dx 250.00  100 each  1  . methylPREDNISolone (MEDROL DOSEPAK) 4 MG tablet follow package directions  21 tablet  0  . metoprolol (LOPRESSOR) 50 MG tablet Take 25 mg by mouth 2 (two) times daily.      . Multiple Vitamin (MULTIVITAMIN) capsule Take 1 capsule by mouth daily.      . Olmesartan-Amlodipine-HCTZ (TRIBENZOR) 40-10-25 MG TABS Take 1 tablet by mouth daily.  90 tablet  1  . omeprazole (PRILOSEC) 20 MG capsule Take 2 capsules (40 mg total) by mouth every morning.  180 capsule  1  . PAROXETINE HCL PO Take 1 tablet by mouth daily.      . potassium chloride (K-DUR,KLOR-CON) 10 MEQ tablet TAKE 1 TABLET (10  MEQ TOTAL) BY MOUTH DAILY.  30 tablet  5  . sildenafil (VIAGRA) 100 MG tablet Take 0.5-1 tablets (50-100 mg total) by mouth daily as needed.  15 tablet  1  . zolpidem (AMBIEN) 10 MG tablet Take 5 mg by mouth at bedtime as needed.      . [DISCONTINUED] potassium chloride (K-DUR) 10 MEQ tablet Take 1 tablet (10 mEq total) by mouth daily.  30 tablet  1   No current facility-administered medications on file prior to visit.    BP 152/96  Pulse 82  Temp(Src) 97.9 F (36.6 C) (Oral)  Resp 16  Wt 336 lb 0.6 oz (152.427 kg)  BMI 44.34 kg/m2  SpO2 97%       Objective:   Physical Exam  Constitutional: He is oriented to person, place, and time. He appears well-developed and well-nourished. No distress.  HENT:  Head: Normocephalic and atraumatic.  Cardiovascular: Normal rate and regular rhythm.   No murmur heard. Pulmonary/Chest: Effort normal and breath sounds normal. No respiratory distress. He has no wheezes. He has no rales. He exhibits no tenderness.  Musculoskeletal:  + tenderness to palpation of the right posterolateral heel.  No swelling or erythema is noted.   Neurological: He is alert and oriented to person, place, and time.  Skin: Skin is warm and dry.  Psychiatric: He has a normal mood and affect. His behavior is normal. Judgment and thought content normal.          Assessment & Plan:

## 2012-11-17 NOTE — Assessment & Plan Note (Signed)
Has had some weight gain since last visit. We discussed diet and exercise.  Obtain A1C, urine microalbumin. He will bring immunization record from [place] for pneumovax so we can update your records.

## 2012-11-18 LAB — HEMOGLOBIN A1C
Hgb A1c MFr Bld: 7.1 % — ABNORMAL HIGH (ref <5.7)
Mean Plasma Glucose: 157 mg/dL — ABNORMAL HIGH (ref <117)

## 2012-11-18 LAB — BASIC METABOLIC PANEL
CO2: 24 mEq/L (ref 19–32)
Calcium: 9.3 mg/dL (ref 8.4–10.5)
Glucose, Bld: 232 mg/dL — ABNORMAL HIGH (ref 70–99)
Potassium: 3.6 mEq/L (ref 3.5–5.3)
Sodium: 135 mEq/L (ref 135–145)

## 2012-11-18 LAB — MICROALBUMIN / CREATININE URINE RATIO: Microalb, Ur: 2.78 mg/dL — ABNORMAL HIGH (ref 0.00–1.89)

## 2012-11-19 ENCOUNTER — Telehealth: Payer: Self-pay | Admitting: Family

## 2012-11-19 MED ORDER — METFORMIN HCL 500 MG PO TABS: 500.0000 mg | ORAL_TABLET | Freq: Two times a day (BID) | ORAL | Status: DC

## 2012-11-19 NOTE — Telephone Encounter (Signed)
Received message from pt that he is already taking metformin 500mg  twice a day. Pt would like clarification?

## 2012-11-19 NOTE — Telephone Encounter (Signed)
Please call pt and let him know that his A1C is above goal at 7.1.  I would like him to add metformin bid for his diabetes to help get sugar under better control. There is some protein in his urine- so it is very important that we keep sugar and bp well controlled so that we can help protect his kidneys.

## 2012-11-19 NOTE — Addendum Note (Signed)
Addended by: Sandford Craze on: 11/19/2012 05:11 PM   Modules accepted: Orders

## 2012-11-19 NOTE — Telephone Encounter (Signed)
Left detailed message on cell and to call if any questions. 

## 2012-11-19 NOTE — Telephone Encounter (Signed)
Left detailed message re: metformin. Will increase to 1000mg  bid.  Change made at pharmacy as well- 500mg  2 tabs po bid .

## 2012-12-08 ENCOUNTER — Telehealth: Payer: Self-pay | Admitting: *Deleted

## 2012-12-08 MED ORDER — METOPROLOL SUCCINATE ER 50 MG PO TB24: 75.0000 mg | ORAL_TABLET | Freq: Every day | ORAL | Status: DC

## 2012-12-08 NOTE — Telephone Encounter (Signed)
Received message from pt stating he had a procedure yesterday. While he was lying down his BP was in the normal range but when he sat up BP went up to 160/97. States home readings have remained in this range. States he was previously unable to take 2 BP pills a day as this caused his BP to drop. Wants to know what he should do? Pt is scheduled for a follow up on 12/14/12.  Please advise.

## 2012-12-08 NOTE — Telephone Encounter (Signed)
Please let pt know that in place of metoprolol bid, I would recommend toprol xl 50 mg- take 1.5 tabs PO daily. We will repeat his bp at his follow up on 9/2.

## 2012-12-09 MED ORDER — METOPROLOL SUCCINATE ER 50 MG PO TB24: ORAL_TABLET | ORAL | Status: DC

## 2012-12-09 NOTE — Telephone Encounter (Signed)
Left detailed message on pt's cell#. Rx sent on 12/08/12 had bid directions. Spoke with Monica at [facility] and cancelled previous rx as pt had not picked it up yet. Gave new directions as stated below.

## 2012-12-14 ENCOUNTER — Ambulatory Visit (INDEPENDENT_AMBULATORY_CARE_PROVIDER_SITE_OTHER): Admitting: Family

## 2012-12-14 ENCOUNTER — Encounter: Payer: Self-pay | Admitting: Family

## 2012-12-14 VITALS — BP 138/90 | HR 80 | Temp 98.5°F | Wt 330.1 lb

## 2012-12-14 DIAGNOSIS — I10 Essential (primary) hypertension: Secondary | ICD-10-CM

## 2012-12-14 DIAGNOSIS — E119 Type 2 diabetes mellitus without complications: Secondary | ICD-10-CM

## 2012-12-14 MED ORDER — METFORMIN HCL ER 500 MG PO TB24: 2000.0000 mg | ORAL_TABLET | Freq: Every day | ORAL | Status: DC

## 2012-12-14 NOTE — Assessment & Plan Note (Signed)
BP looks ok here, but due to dizziness will decrease the toprol xl to 50 mg once daily.  Continue tribenzor.

## 2012-12-14 NOTE — Patient Instructions (Addendum)
Stop regular metformin and instead start extended release metformin 4 tabs PO daily. Decrease toprol xl back to 1 tablet once daily. Call me in 1 week with your daily BP readings and let me know how your diarrhea is doing. Follow up in 1 month.

## 2012-12-14 NOTE — Assessment & Plan Note (Signed)
I suspect that his diarrhea is a result of metformin and that diarrhea may be contributing to his dizziness. Will try switching metformin to metformin XR.  If no improvement in diarrhea, will need to consider discontinuation of metformin.

## 2012-12-14 NOTE — Progress Notes (Signed)
Subjective:    Patient ID: Charles Nash, male    DOB: [birth year], 47 y.o.   MRN: [id number]  HPI  Charles Nash is a 47 yr old male who presents to for follow up of his HTN.  Since his last visit his metoprolol 50mg  BID has been changed to toprol xl 75mg  daily due to dizziness.  Notes that he has occasional dizziness and that his blood pressure does drop sometimes as low as 90's systolic. He does not note much improvement in his dizziness since this medication was changed. He does report that he continues to have diarrhea which he attributes to the metformin.  Reports 3-4 loose stools a day.    He has not been checking his sugars regularly. He did receive a steroid injection last week.  Works in the sun, Nurse, learning disability.      Review of Systems See HPI  Past Medical History  Diagnosis Date  . Hyperlipidemia   . Diabetes mellitus   . Hypertension   . OSA (obstructive sleep apnea)   . Arthritis   . Allergy   . Personal history of colonic polyps   . GERD (gastroesophageal reflux disease)   . Fatty liver 08/06/2011  . Bilateral varicoceles   . Plantar fasciitis     History   Social History  . Marital Status: Married    Spouse Name: N/A    Number of Children: 2  . Years of Education: N/A   Occupational History  . FOOTBALL COACH    Social History Main Topics  . Smoking status: Never Smoker   . Smokeless tobacco: Never Used  . Alcohol Use: No     Comment: occasional wine  . Drug Use: No  . Sexual Activity: Not on file   Other Topics Concern  . Not on file   Social History Narrative   Regular exercise:  No   Caffeine use:  2--32oz cups daily          Past Surgical History  Procedure Laterality Date  . Knee surgery  08/04/08    Right knee-- medial meniscus repair, attenuation anterior cruciate [name] MD [facility])   . Ankle surgery    . Foot surgery    . Shoulder surgery    . Varicocele excision    . Plantar fascia release       Family History  Problem Relation Age of Onset  . Diabetes Mother   . Hypertension Mother   . Arthritis Mother   . Cancer Mother     uterine and breast cancer  . Hyperlipidemia Father   . Arthritis Father   . Cancer Father     prostate cancer  . Hyperlipidemia Maternal Grandmother   . Hypertension Maternal Grandmother   . Hyperlipidemia Maternal Grandfather   . Hypertension Maternal Grandfather   . Hyperlipidemia Paternal Grandmother   . Hypertension Paternal Grandmother   . Hyperlipidemia Paternal Grandfather   . Hypertension Paternal Grandfather   . Heart attack Paternal Grandfather   . Sudden death Neg Hx   . Colon cancer Neg Hx   . Esophageal cancer Neg Hx   . Stomach cancer Neg Hx   . Rectal cancer Neg Hx     Allergies  Allergen Reactions  . Oxycodone-Acetaminophen Hives and Itching    Current Outpatient Prescriptions on File Prior to Visit  Medication Sig Dispense Refill  . aspirin 81 MG tablet Take 81 mg by mouth daily.        . Blood  Glucose Monitoring Suppl (ONE TOUCH ULTRA 2) W/DEVICE KIT Use to check blood sugar once a day.  Dx 250.00  1 each  0  . cetirizine (ZYRTEC) 10 MG tablet Take 1 tablet (10 mg total) by mouth daily.  30 tablet  11  . fluticasone (FLONASE) 50 MCG/ACT nasal spray Place 1 spray into the nose daily.      Marland Kitchen glucose blood (ONE TOUCH ULTRA TEST) test strip Use to check blood sugar once daily as instructed. Dx 250.00  50 each  2  . Lancets (ONETOUCH ULTRASOFT) lancets Use to check blood sugar once daily as instructed.  Dx 250.00  100 each  1  . Multiple Vitamin (MULTIVITAMIN) capsule Take 1 capsule by mouth daily.      . niacin (SLO-NIACIN) 500 MG tablet Take 1 tablet (500 mg total) by mouth at bedtime.  30 tablet  5  . Olmesartan-Amlodipine-HCTZ (TRIBENZOR) 40-10-25 MG TABS Take 1 tablet by mouth daily.  90 tablet  1  . omeprazole (PRILOSEC) 20 MG capsule Take 2 capsules (40 mg total) by mouth every morning.  180 capsule  1  . PAROXETINE  HCL PO Take 10 mg by mouth daily.       . potassium chloride (K-DUR,KLOR-CON) 10 MEQ tablet TAKE 1 TABLET (10 MEQ TOTAL) BY MOUTH DAILY.  30 tablet  5  . sildenafil (VIAGRA) 100 MG tablet Take 0.5-1 tablets (50-100 mg total) by mouth daily as needed.  15 tablet  5  . zolpidem (AMBIEN) 10 MG tablet Take 0.5 tablets (5 mg total) by mouth at bedtime as needed.  30 tablet  0  . [DISCONTINUED] potassium chloride (K-DUR) 10 MEQ tablet Take 1 tablet (10 mEq total) by mouth daily.  30 tablet  1   No current facility-administered medications on file prior to visit.    BP 138/90  Pulse 80  Temp(Src) 98.5 F (36.9 C) (Oral)  Wt 330 lb 1.3 oz (149.723 kg)  BMI 43.56 kg/m2  SpO2 98%       Objective:   Physical Exam  Constitutional: He is oriented to person, place, and time. He appears well-developed and well-nourished. No distress.  HENT:  Head: Normocephalic and atraumatic.  Cardiovascular: Normal rate and regular rhythm.   No murmur heard. Pulmonary/Chest: Effort normal and breath sounds normal. No respiratory distress. He has no wheezes. He has no rales. He exhibits no tenderness.  Neurological: He is alert and oriented to person, place, and time.  Psychiatric: He has a normal mood and affect. His behavior is normal. Judgment and thought content normal.          Assessment & Plan:

## 2012-12-27 ENCOUNTER — Telehealth: Payer: Self-pay | Admitting: *Deleted

## 2012-12-27 MED ORDER — OLMESARTAN-AMLODIPINE-HCTZ 40-10-25 MG PO TABS: 1.0000 | ORAL_TABLET | Freq: Every day | ORAL | Status: DC

## 2012-12-27 MED ORDER — SPHYGMOMANOMETER MISC: Status: DC

## 2012-12-27 NOTE — Telephone Encounter (Signed)
Received request from pt to send refill of tribenzor to [facility] pharmacy (30 day supply) as he recently moved and is trying to get that corrected with mail order pharmacy. Pt also requests rx for BP monitor and lg cuff.  Rxs sent.

## 2013-01-03 ENCOUNTER — Ambulatory Visit (INDEPENDENT_AMBULATORY_CARE_PROVIDER_SITE_OTHER): Admitting: Family

## 2013-01-03 ENCOUNTER — Encounter: Payer: Self-pay | Admitting: Family

## 2013-01-03 VITALS — BP 156/90 | HR 92 | Temp 97.8°F | Wt 332.5 lb

## 2013-01-03 DIAGNOSIS — E119 Type 2 diabetes mellitus without complications: Secondary | ICD-10-CM

## 2013-01-03 DIAGNOSIS — E042 Nontoxic multinodular goiter: Secondary | ICD-10-CM

## 2013-01-03 DIAGNOSIS — I10 Essential (primary) hypertension: Secondary | ICD-10-CM

## 2013-01-03 DIAGNOSIS — Z23 Encounter for immunization: Secondary | ICD-10-CM

## 2013-01-03 MED ORDER — DAPAGLIFLOZIN PROPANEDIOL 10 MG PO TABS: 1.0000 | ORAL_TABLET | Freq: Every day | ORAL | Status: DC

## 2013-01-03 NOTE — Assessment & Plan Note (Addendum)
Hypertensive in office 156/90. Denies chest pain, dizziness and headaches. Patient also denies continual episodes of hypotension.  Continue Toprol XL 75mg  BID and Tribenzor as prescribed. Patient to follow up in 1 month and will bring new blood pressure machine and cuff.

## 2013-01-03 NOTE — Progress Notes (Deleted)
  Subjective:    Patient ID: Charles Nash, male    DOB: October 27, 1965, 47 y.o.   MRN: [id number]  HPI    Review of Systems     Objective:   Physical Exam        Assessment & Plan:

## 2013-01-03 NOTE — Assessment & Plan Note (Signed)
Blood sugar readings remain in 170's to 120's, patient report diarrhea has continued despite change to Metformin XR.   Discontinued Metformin XR 500mg  tablets, started [place] 10mg  tablets daily. Follow up in 1 month, plan to check fasting labs next visit.

## 2013-01-03 NOTE — Patient Instructions (Addendum)
Please stop metformin, start farxiga for your sugar. Complete your lab work prior to leaving.  Follow up in 1 month.

## 2013-01-03 NOTE — Progress Notes (Signed)
Subjective:    Patient ID: Charles Nash, male    DOB: 1965-05-22, 47 y.o.   MRN: [id number]  HPI Charles Nash is a 47 year old male who presents today for follow up of multiple health problems.  1) HTN: Patient reports he has purchased a new blood pressure cuff and has been consistently reading high at home, patient hasn't had any low readings. Patient taking tribenzor as directed and has been only taking metoprolol 50mg  twice daily rather than 75mg  twice daily. Patient denies dizziness, chest pain.  2)Diabetes: Patient reports he's checking his blood sugars at home and has been running between 120 to180.   3)Diarrhea: Patient continues to report multiple watery stools since Metformin was changed to XR form last visit. Patient now reporting that he's seeing the pills in his stools in their whole form, denies blood in his stools. Patient also reporting intermittent epigastric abdominal pain during these episodes.  4)Diet: Patient reports he stopped drinking sodas 2 weeks ago, drinks sweet tea and juices, has increased his water intake, and still eats fried fatty foods.   Review of Systems  Constitutional: Positive for fatigue.       Reports increased amount of sleep over past week.  Respiratory: Negative for shortness of breath.   Cardiovascular: Negative for chest pain.  Gastrointestinal: Positive for diarrhea.       Diarrhea has not changed since last visit.  Endocrine: Positive for polyphagia.  Neurological: Negative for dizziness and light-headedness.       Past Medical History  Diagnosis Date  . Hyperlipidemia   . Diabetes mellitus   . Hypertension   . OSA (obstructive sleep apnea)   . Arthritis   . Allergy   . Personal history of colonic polyps   . GERD (gastroesophageal reflux disease)   . Fatty liver 08/06/2011  . Bilateral varicoceles   . Plantar fasciitis     History   Social History  . Marital Status: Married    Spouse Name: N/A    Number of Children:  2  . Years of Education: N/A   Occupational History  . FOOTBALL COACH    Social History Main Topics  . Smoking status: Never Smoker   . Smokeless tobacco: Never Used  . Alcohol Use: No     Comment: occasional wine  . Drug Use: No  . Sexual Activity: Not on file   Other Topics Concern  . Not on file   Social History Narrative   Regular exercise:  No   Caffeine use:  2--32oz cups daily          Past Surgical History  Procedure Laterality Date  . Knee surgery  08/04/08    Right knee-- medial meniscus repair, attenuation anterior cruciate [name] MD [facility])   . Ankle surgery    . Foot surgery    . Shoulder surgery    . Varicocele excision    . Plantar fascia release      Family History  Problem Relation Age of Onset  . Diabetes Mother   . Hypertension Mother   . Arthritis Mother   . Cancer Mother     uterine and breast cancer  . Hyperlipidemia Father   . Arthritis Father   . Cancer Father     prostate cancer  . Hyperlipidemia Maternal Grandmother   . Hypertension Maternal Grandmother   . Hyperlipidemia Maternal Grandfather   . Hypertension Maternal Grandfather   . Hyperlipidemia Paternal Grandmother   . Hypertension Paternal  Grandmother   . Hyperlipidemia Paternal Grandfather   . Hypertension Paternal Grandfather   . Heart attack Paternal Grandfather   . Sudden death Neg Hx   . Colon cancer Neg Hx   . Esophageal cancer Neg Hx   . Stomach cancer Neg Hx   . Rectal cancer Neg Hx     Allergies  Allergen Reactions  . Oxycodone-Acetaminophen Hives and Itching    Current Outpatient Prescriptions on File Prior to Visit  Medication Sig Dispense Refill  . aspirin 81 MG tablet Take 81 mg by mouth daily.        . Blood Glucose Monitoring Suppl (ONE TOUCH ULTRA 2) W/DEVICE KIT Use to check blood sugar once a day.  Dx 250.00  1 each  0  . Blood Pressure Monitoring (SPHYGMOMANOMETER) MISC Use as instructed to check blood pressure. Dx 401.9  1  each  0  . cetirizine (ZYRTEC) 10 MG tablet Take 1 tablet (10 mg total) by mouth daily.  30 tablet  11  . fluticasone (FLONASE) 50 MCG/ACT nasal spray Place 1 spray into the nose daily.      Marland Kitchen glucose blood (ONE TOUCH ULTRA TEST) test strip Use to check blood sugar once daily as instructed. Dx 250.00  50 each  2  . Lancets (ONETOUCH ULTRASOFT) lancets Use to check blood sugar once daily as instructed.  Dx 250.00  100 each  1  . metoprolol succinate (TOPROL-XL) 50 MG 24 hr tablet 50 mg. Take 1 1/2 tablets (75mg ) by mouth daily. Take with or immediately following a meal.      . Multiple Vitamin (MULTIVITAMIN) capsule Take 1 capsule by mouth daily.      . niacin (SLO-NIACIN) 500 MG tablet Take 1 tablet (500 mg total) by mouth at bedtime.  30 tablet  5  . Olmesartan-Amlodipine-HCTZ (TRIBENZOR) 40-10-25 MG TABS Take 1 tablet by mouth daily.  30 tablet  0  . omeprazole (PRILOSEC) 20 MG capsule Take 2 capsules (40 mg total) by mouth every morning.  180 capsule  1  . PAROXETINE HCL PO Take 10 mg by mouth daily.       . potassium chloride (K-DUR,KLOR-CON) 10 MEQ tablet TAKE 1 TABLET (10 MEQ TOTAL) BY MOUTH DAILY.  30 tablet  5  . sildenafil (VIAGRA) 100 MG tablet Take 0.5-1 tablets (50-100 mg total) by mouth daily as needed.  15 tablet  5  . zolpidem (AMBIEN) 10 MG tablet Take 0.5 tablets (5 mg total) by mouth at bedtime as needed.  30 tablet  0  . [DISCONTINUED] potassium chloride (K-DUR) 10 MEQ tablet Take 1 tablet (10 mEq total) by mouth daily.  30 tablet  1   No current facility-administered medications on file prior to visit.    BP 156/90  Pulse 92  Temp(Src) 97.8 F (36.6 C) (Oral)  Wt 332 lb 8 oz (150.821 kg)  BMI 43.88 kg/m2  SpO2 96%    Objective:   Physical Exam  Constitutional: He is oriented to person, place, and time. He appears well-developed and well-nourished. No distress.  HENT:  Head: Normocephalic and atraumatic.  Cardiovascular: Normal rate and regular rhythm.   No  murmur heard. Pulmonary/Chest: Effort normal and breath sounds normal. No respiratory distress. He has no wheezes. He has no rales. He exhibits no tenderness.  Musculoskeletal: He exhibits no edema.  Lymphadenopathy:    He has no cervical adenopathy.  Neurological: He is alert and oriented to person, place, and time.  Psychiatric: He has a  normal mood and affect. His behavior is normal. Judgment and thought content normal.          Assessment & Plan:

## 2013-01-05 ENCOUNTER — Ambulatory Visit (HOSPITAL_BASED_OUTPATIENT_CLINIC_OR_DEPARTMENT_OTHER)
Admission: RE | Admit: 2013-01-05 | Discharge: 2013-01-05 | Disposition: A | Discharge status: Home / Self Care | Source: Ambulatory Visit | Attending: Family | Admitting: Family

## 2013-01-05 ENCOUNTER — Telehealth: Payer: Self-pay | Admitting: *Deleted

## 2013-01-05 DIAGNOSIS — E042 Nontoxic multinodular goiter: Secondary | ICD-10-CM

## 2013-01-05 NOTE — Telephone Encounter (Signed)
Received prior auth request from [employer] for pt's Farxiga. Form initiated and forwarded to Provider for completion / signature.

## 2013-01-06 ENCOUNTER — Encounter: Payer: Self-pay | Admitting: Physician Assistant

## 2013-01-06 ENCOUNTER — Encounter: Payer: Self-pay | Admitting: Family

## 2013-01-06 ENCOUNTER — Ambulatory Visit (INDEPENDENT_AMBULATORY_CARE_PROVIDER_SITE_OTHER): Admitting: Physician Assistant

## 2013-01-06 VITALS — BP 144/96 | HR 88 | Temp 97.9°F | Resp 18 | Ht 73.0 in | Wt 332.0 lb

## 2013-01-06 DIAGNOSIS — R599 Enlarged lymph nodes, unspecified: Secondary | ICD-10-CM

## 2013-01-06 DIAGNOSIS — M79673 Pain in unspecified foot: Secondary | ICD-10-CM

## 2013-01-06 DIAGNOSIS — R591 Generalized enlarged lymph nodes: Secondary | ICD-10-CM | POA: Insufficient documentation

## 2013-01-06 NOTE — Progress Notes (Signed)
Patient ID: Charles Nash, male   DOB: 1965/05/03, 47 y.o.   MRN: [id number]  Patient presents to clinic today c/o bump/cyst on right-side of neck that is tender, first noticed a day ago.  Patient is concerned about bump because of his history of multinodular goiter.  Patient is also requesting results of recent thyroid US.  Patient is usually seen by PCP Sandford Craze who ordered the test.  Test was performed yesterday.  Patient denies fever, chills, sweats, weight loss, recent cold or illness.  Patient endorses that the bump is much less tender today than yesterday but is still bothering him.  I spent 10 minutes alone discussing results of procedure with patient.  Advised patient that his PCP would probably be calling him with his results as well, given that the procedure was performed yesterday and she is out of office today.  Past Medical History  Diagnosis Date  . Hyperlipidemia   . Diabetes mellitus   . Hypertension   . OSA (obstructive sleep apnea)   . Arthritis   . Allergy   . Personal history of colonic polyps   . GERD (gastroesophageal reflux disease)   . Fatty liver 08/06/2011  . Bilateral varicoceles   . Plantar fasciitis     Current Outpatient Prescriptions on File Prior to Visit  Medication Sig Dispense Refill  . aspirin 81 MG tablet Take 81 mg by mouth daily.        . Blood Glucose Monitoring Suppl (ONE TOUCH ULTRA 2) W/DEVICE KIT Use to check blood sugar once a day.  Dx 250.00  1 each  0  . Blood Pressure Monitoring (SPHYGMOMANOMETER) MISC Use as instructed to check blood pressure. Dx 401.9  1 each  0  . cetirizine (ZYRTEC) 10 MG tablet Take 1 tablet (10 mg total) by mouth daily.  30 tablet  11  . Dapagliflozin Propanediol (FARXIGA) 10 MG TABS Take 1 tablet by mouth daily.  30 tablet  2  . fluticasone (FLONASE) 50 MCG/ACT nasal spray Place 1 spray into the nose daily.      Marland Kitchen glucose blood (ONE TOUCH ULTRA TEST) test strip Use to check blood sugar once daily as  instructed. Dx 250.00  50 each  2  . Lancets (ONETOUCH ULTRASOFT) lancets Use to check blood sugar once daily as instructed.  Dx 250.00  100 each  1  . metoprolol succinate (TOPROL-XL) 50 MG 24 hr tablet 50 mg. Take 1 1/2 tablets (75mg ) by mouth daily. Take with or immediately following a meal.      . Multiple Vitamin (MULTIVITAMIN) capsule Take 1 capsule by mouth daily.      . niacin (SLO-NIACIN) 500 MG tablet Take 1 tablet (500 mg total) by mouth at bedtime.  30 tablet  5  . Olmesartan-Amlodipine-HCTZ (TRIBENZOR) 40-10-25 MG TABS Take 1 tablet by mouth daily.  30 tablet  0  . omeprazole (PRILOSEC) 20 MG capsule Take 2 capsules (40 mg total) by mouth every morning.  180 capsule  1  . PAROXETINE HCL PO Take 10 mg by mouth daily.       . potassium chloride (K-DUR,KLOR-CON) 10 MEQ tablet TAKE 1 TABLET (10 MEQ TOTAL) BY MOUTH DAILY.  30 tablet  5  . sildenafil (VIAGRA) 100 MG tablet Take 0.5-1 tablets (50-100 mg total) by mouth daily as needed.  15 tablet  5  . zolpidem (AMBIEN) 10 MG tablet Take 0.5 tablets (5 mg total) by mouth at bedtime as needed.  30 tablet  0  . [  DISCONTINUED] potassium chloride (K-DUR) 10 MEQ tablet Take 1 tablet (10 mEq total) by mouth daily.  30 tablet  1   No current facility-administered medications on file prior to visit.    Allergies  Allergen Reactions  . Oxycodone-Acetaminophen Hives and Itching    Family History  Problem Relation Age of Onset  . Diabetes Mother   . Hypertension Mother   . Arthritis Mother   . Cancer Mother     uterine and breast cancer  . Hyperlipidemia Father   . Arthritis Father   . Cancer Father     prostate cancer  . Hyperlipidemia Maternal Grandmother   . Hypertension Maternal Grandmother   . Hyperlipidemia Maternal Grandfather   . Hypertension Maternal Grandfather   . Hyperlipidemia Paternal Grandmother   . Hypertension Paternal Grandmother   . Hyperlipidemia Paternal Grandfather   . Hypertension Paternal Grandfather   .  Heart attack Paternal Grandfather   . Sudden death Neg Hx   . Colon cancer Neg Hx   . Esophageal cancer Neg Hx   . Stomach cancer Neg Hx   . Rectal cancer Neg Hx     History   Social History  . Marital Status: Married    Spouse Name: N/A    Number of Children: 2  . Years of Education: N/A   Occupational History  . FOOTBALL COACH    Social History Main Topics  . Smoking status: Never Smoker   . Smokeless tobacco: Never Used  . Alcohol Use: No     Comment: occasional wine  . Drug Use: No  . Sexual Activity: None   Other Topics Concern  . None   Social History Narrative   Regular exercise:  No   Caffeine use:  2--32oz cups daily         Review of Systems  Constitutional: Negative for fever, chills, weight loss, malaise/fatigue and diaphoresis.  HENT: Negative for congestion and sore throat.   Respiratory: Negative for cough, sputum production, shortness of breath and wheezing.   Skin: Negative for rash.   Filed Vitals:   01/06/13 1403  BP: 144/96  Pulse: 88  Temp: 97.9 F (36.6 C)  Resp: 18   Physical Exam  Vitals reviewed. Constitutional: He is oriented to person, place, and time and well-developed, well-nourished, and in no distress.  HENT:  Head: Normocephalic and atraumatic.  Right Ear: External ear normal.  Left Ear: External ear normal.  Nose: Nose normal.  Mouth/Throat: Oropharynx is clear and moist. No oropharyngeal exudate.  TM WNL bilaterally  Eyes: Conjunctivae are normal.  Neck: Neck supple.  Presence of a 5 mm knot under skin of right sternocleidomastoid muscle.  Possibly a reactive lymph node.  No lymphadenopathy present elsewhere  Cardiovascular: Normal rate, regular rhythm and normal heart sounds.   Pulmonary/Chest: Breath sounds normal. No respiratory distress. He has no wheezes. He has no rales. He exhibits no tenderness.  Lymphadenopathy:    He has no cervical adenopathy.  Neurological: He is alert and oriented to person, place, and  time.  Skin: Skin is warm and dry.     Recent Results (from the past 2160 hour(s))  URIC ACID     Status: None   Collection Time    11/17/12 11:20 AM      Result Value Range   Uric Acid, Serum 7.4  4.0 - 7.8 mg/dL  BASIC METABOLIC PANEL     Status: Abnormal   Collection Time    11/17/12 11:45 AM  Result Value Range   Sodium 135  135 - 145 mEq/L   Potassium 3.6  3.5 - 5.3 mEq/L   Chloride 103  96 - 112 mEq/L   CO2 24  19 - 32 mEq/L   Glucose, Bld 232 (*) 70 - 99 mg/dL   BUN 10  6 - 23 mg/dL   Creat 1.47  8.29 - 5.62 mg/dL   Calcium 9.3  8.4 - 13.0 mg/dL  HEMOGLOBIN Q6V     Status: Abnormal   Collection Time    11/17/12 11:45 AM      Result Value Range   Hemoglobin A1C 7.1 (*) <5.7 %   Comment:                                                                            According to the ADA Clinical Practice Recommendations for 2011, when     HbA1c is used as a screening test:             >=6.5%   Diagnostic of Diabetes Mellitus                (if abnormal result is confirmed)           5.7-6.4%   Increased risk of developing Diabetes Mellitus           References:Diagnosis and Classification of Diabetes Mellitus,Diabetes     Care,2011,34(Suppl 1):S62-S69 and Standards of Medical Care in             Diabetes - 2011,Diabetes Care,2011,34 (Suppl 1):S11-S61.         Mean Plasma Glucose 157 (*) <117 mg/dL  MICROALBUMIN / CREATININE URINE RATIO     Status: Abnormal   Collection Time    11/17/12 11:45 AM      Result Value Range   Microalb, Ur 2.78 (*) 0.00 - 1.89 mg/dL   Creatinine, Urine 784.6     Microalb Creat Ratio 18.2  0.0 - 30.0 mg/g  TSH     Status: None   Collection Time    01/03/13  3:35 PM      Result Value Range   TSH 0.380  0.350 - 4.500 uIU/mL    Assessment/Plan: No problem-specific assessment & plan notes found for this encounter.

## 2013-01-06 NOTE — Patient Instructions (Signed)
Please continue to monitor the place of concern.  If it persists, please return to clinic.

## 2013-01-06 NOTE — Assessment & Plan Note (Signed)
Improving.  Reassure patient this is not related to goiter. Watch for 1 week.  If symptoms persist, please return to clinic.

## 2013-01-11 MED ORDER — SITAGLIPTIN PHOSPHATE 100 MG PO TABS: 100.0000 mg | ORAL_TABLET | Freq: Every day | ORAL | Status: DC

## 2013-01-11 NOTE — Telephone Encounter (Signed)
Notified pt. He voices understanding. Pt also wants to know at what point he would need treatment for his thyroid. Pt concerned about future monitoring of his thyroid as he reports strong family history of thyroid problems / cancer in his father. Please advise.

## 2013-01-11 NOTE — Telephone Encounter (Signed)
I am happy to set him up to see endocrinology to continue to monitor his thyroid.  I have placed order.

## 2013-01-11 NOTE — Telephone Encounter (Signed)
Please call pt and let him know that his insurance will not cover farxiga unless he tries Januvia first which is the preferred med.  Stop farxiga, start [place].  rx sent for [place].

## 2013-01-12 NOTE — Telephone Encounter (Signed)
Attempted to reach pt, "voicemail box is full".

## 2013-01-12 NOTE — Telephone Encounter (Signed)
Notified pt and he voices understanding. Pt states he was seen one week ago for a cyst that came up between his neck and shoulder. He was advised to watch it and let us know if it did not resolve. Pt states the cyst is still there.  Please advise.

## 2013-01-13 ENCOUNTER — Telehealth: Payer: Self-pay | Admitting: Family

## 2013-01-13 NOTE — Telephone Encounter (Signed)
Lets bring him back into the office so that I can look at his stye and his neck please.

## 2013-01-13 NOTE — Telephone Encounter (Signed)
Patient states that he would like [name] to call in an eye drop for the stye on his eye. He says that she has seen him before for this. [facility].

## 2013-01-13 NOTE — Telephone Encounter (Signed)
I don't see that our office has given him drops in the past.  I would advise warm compresses with face cloth 2-3 times a day. Drops won't do much to help it heal faster.  Should be seen in office if symptoms worsen or if not improved in 2-3 days.

## 2013-01-14 NOTE — Telephone Encounter (Signed)
Notified pt and scheduled appt for Monday, 01/17/13 at 8am.

## 2013-01-14 NOTE — Telephone Encounter (Signed)
Notified pt and he voices understanding. Already has appt scheduled for 01/17/13 and can reassess at that time if not improved.

## 2013-01-17 ENCOUNTER — Ambulatory Visit (INDEPENDENT_AMBULATORY_CARE_PROVIDER_SITE_OTHER): Admitting: Family

## 2013-01-17 ENCOUNTER — Ambulatory Visit: Admitting: Family

## 2013-01-17 VITALS — BP 156/104 | HR 96 | Temp 97.7°F | Resp 18 | Wt 339.1 lb

## 2013-01-17 DIAGNOSIS — H00016 Hordeolum externum left eye, unspecified eyelid: Secondary | ICD-10-CM

## 2013-01-17 DIAGNOSIS — R599 Enlarged lymph nodes, unspecified: Secondary | ICD-10-CM

## 2013-01-17 DIAGNOSIS — I10 Essential (primary) hypertension: Secondary | ICD-10-CM

## 2013-01-17 DIAGNOSIS — E119 Type 2 diabetes mellitus without complications: Secondary | ICD-10-CM

## 2013-01-17 DIAGNOSIS — H00019 Hordeolum externum unspecified eye, unspecified eyelid: Secondary | ICD-10-CM | POA: Insufficient documentation

## 2013-01-17 DIAGNOSIS — R591 Generalized enlarged lymph nodes: Secondary | ICD-10-CM

## 2013-01-17 MED ORDER — ONETOUCH ULTRASOFT LANCETS MISC: Status: DC

## 2013-01-17 MED ORDER — METOPROLOL SUCCINATE ER 100 MG PO TB24: 100.0000 mg | ORAL_TABLET | Freq: Every day | ORAL | Status: DC

## 2013-01-17 NOTE — Assessment & Plan Note (Signed)
Taking Tribenzor and Metoprolol 75mg  daily. Patient reports high readings at home.  Change to Toprol XL100mg  daily, continue to check readings at home, record readings and bring to follow up appointment in 1 month.

## 2013-01-17 NOTE — Assessment & Plan Note (Signed)
Patient reporting readings between 160-100 at home. Patient does not check daily. Recently started on Januiva due to intolerance to Metformin.  Check blood sugars twice daily, record readings and bring to next appointment in one month. Continue Januiva.

## 2013-01-17 NOTE — Assessment & Plan Note (Signed)
Poorly defined, non tender, feels like fatty subcutaneous tissue.  Continue to monitor, ultrasound if symptoms worsen.

## 2013-01-17 NOTE — Progress Notes (Signed)
  Subjective:    Patient ID: Charles Nash, male    DOB: 1965/04/22, 47 y.o.   MRN: [id number]  HPI Charles Nash is a 47 year old male who presents today for follow up.  1) Cyst to neck- reports to be feeling better, denies pain, denies swelling  2) Stye- patient reports stye to left medial eye x 3, patient reports the stye to be feeling better and has decreased in redness and drainage.   3) Diabetes - does not check regularly check, sugars running 160, 140, 112. Started Januvia last week due to inability to take Metformin.   4) HTN- Patient reports his readings are mixed ranging from 110/70 to 150/100. Patient taking his Tribenzor daily and Metoprolol most days due to readings.   Review of Systems  Constitutional: Negative for appetite change.  HENT: Negative for congestion.   Respiratory: Negative for cough, chest tightness and shortness of breath.   Cardiovascular: Negative for chest pain.  Gastrointestinal: Negative for diarrhea.       Decreased since removal from Metformin  Endocrine: Negative for polyuria.       Reports nocturnal polyuria over past few weeks.   Genitourinary: Negative for dysuria and urgency.  Neurological: Negative for headaches.  Psychiatric/Behavioral: Negative for behavioral problems.       Objective:   Physical Exam  Constitutional: He is oriented to person, place, and time. He appears well-nourished.  HENT:  Head: Normocephalic.  Eyes: Pupils are equal, round, and reactive to light. Left eye exhibits no discharge.  Redness and mild swelling to left medial lower lid, patient reports symptoms are better  Neck: Neck supple.  Small amount of subcutaneous tissue the size of a penny present to right side between his neck and shoulder.  Non tender, no erythema   Cardiovascular: Normal rate and regular rhythm.   Pulmonary/Chest: Effort normal and breath sounds normal. No respiratory distress.  Abdominal: There is no tenderness.  Lymphadenopathy:   He has no cervical adenopathy.  Neurological: He is alert and oriented to person, place, and time.  Skin: Skin is warm and dry.  Psychiatric: His behavior is normal.          Assessment & Plan:

## 2013-01-17 NOTE — Patient Instructions (Addendum)
Please increase toprol xl to 100mg  once daily. Continue [place]. Check sugars 2x daily and record.  Follow up in 1 month for blood pressure check.

## 2013-01-17 NOTE — Assessment & Plan Note (Signed)
Patient overweight.  Counseled on importance of fresh fruits, vegetables, whole grains, and to decrease fatty foods. Also counseled to exercise 30 min daily at least five days a week.

## 2013-01-17 NOTE — Assessment & Plan Note (Signed)
Stye present to left medial lower eye lid x 4 days.  Mild redness, no drainage.  Warm compresses to eye twice daily. Follow up if symptoms worsen.

## 2013-01-17 NOTE — Progress Notes (Signed)
  Subjective:    Patient ID: Charles Nash, male    DOB: [birth year], 47 y.o.   MRN: [id number]  HPI    Review of Systems     Objective:   Physical Exam        Assessment & Plan:  I have seen and examined Charles Nash and agree with Ms. Ophelia Charter assessment and plan.

## 2013-01-18 ENCOUNTER — Encounter: Payer: Self-pay | Admitting: Endocrinology

## 2013-01-18 ENCOUNTER — Ambulatory Visit (INDEPENDENT_AMBULATORY_CARE_PROVIDER_SITE_OTHER): Admitting: Endocrinology

## 2013-01-18 VITALS — BP 136/84 | HR 80 | Ht 72.0 in | Wt 336.0 lb

## 2013-01-18 DIAGNOSIS — E042 Nontoxic multinodular goiter: Secondary | ICD-10-CM

## 2013-01-18 NOTE — Progress Notes (Signed)
Subjective:    Patient ID: Charles Nash, male    DOB: 1965/09/03, 47 y.o.   MRN: [id number]  HPI In 2013, pt was incidentally noted on MRI to have a goiter.  He had a bx (benign) in 2013.  He has mild if any swelling at the anterior neck, or assoc pain.  Past Medical History  Diagnosis Date  . Hyperlipidemia   . Diabetes mellitus   . Hypertension   . OSA (obstructive sleep apnea)   . Arthritis   . Allergy   . Personal history of colonic polyps   . GERD (gastroesophageal reflux disease)   . Fatty liver 08/06/2011  . Bilateral varicoceles   . Plantar fasciitis     Past Surgical History  Procedure Laterality Date  . Knee surgery  08/04/08    Right knee-- medial meniscus repair, attenuation anterior cruciate [name] MD [facility])   . Ankle surgery    . Foot surgery    . Shoulder surgery    . Varicocele excision    . Plantar fascia release      History   Social History  . Marital Status: Married    Spouse Name: N/A    Number of Children: 2  . Years of Education: N/A   Occupational History  . FOOTBALL COACH    Social History Main Topics  . Smoking status: Never Smoker   . Smokeless tobacco: Never Used  . Alcohol Use: No     Comment: occasional wine  . Drug Use: No  . Sexual Activity: Not on file   Other Topics Concern  . Not on file   Social History Narrative   Regular exercise:  No   Caffeine use:  2--32oz cups daily          Current Outpatient Prescriptions on File Prior to Visit  Medication Sig Dispense Refill  . aspirin 81 MG tablet Take 81 mg by mouth daily.        . Blood Glucose Monitoring Suppl (ONE TOUCH ULTRA 2) W/DEVICE KIT Use to check blood sugar once a day.  Dx 250.00  1 each  0  . Blood Pressure Monitoring (SPHYGMOMANOMETER) MISC Use as instructed to check blood pressure. Dx 401.9  1 each  0  . cetirizine (ZYRTEC) 10 MG tablet Take 1 tablet (10 mg total) by mouth daily.  30 tablet  11  . fluticasone (FLONASE) 50  MCG/ACT nasal spray Place 1 spray into the nose daily.      Marland Kitchen glucose blood (ONE TOUCH ULTRA TEST) test strip Use to check blood sugar once daily as instructed. Dx 250.00  50 each  2  . Lancets (ONETOUCH ULTRASOFT) lancets Use to check blood sugar once daily as instructed.  Dx 250.00  100 each  1  . metoprolol succinate (TOPROL-XL) 100 MG 24 hr tablet Take 1 tablet (100 mg total) by mouth daily. Take with or immediately following a meal.  30 tablet  2  . Multiple Vitamin (MULTIVITAMIN) capsule Take 1 capsule by mouth daily.      . niacin (SLO-NIACIN) 500 MG tablet Take 1 tablet (500 mg total) by mouth at bedtime.  30 tablet  5  . Olmesartan-Amlodipine-HCTZ (TRIBENZOR) 40-10-25 MG TABS Take 1 tablet by mouth daily.  30 tablet  0  . omeprazole (PRILOSEC) 20 MG capsule Take 2 capsules (40 mg total) by mouth every morning.  180 capsule  1  . PAROXETINE HCL PO Take 10 mg by mouth daily.       Marland Kitchen  potassium chloride (K-DUR,KLOR-CON) 10 MEQ tablet TAKE 1 TABLET (10 MEQ TOTAL) BY MOUTH DAILY.  30 tablet  5  . sildenafil (VIAGRA) 100 MG tablet Take 0.5-1 tablets (50-100 mg total) by mouth daily as needed.  15 tablet  5  . sitaGLIPtin (JANUVIA) 100 MG tablet Take 100 mg by mouth daily.      Marland Kitchen zolpidem (AMBIEN) 10 MG tablet Take 0.5 tablets (5 mg total) by mouth at bedtime as needed.  30 tablet  0  . [DISCONTINUED] potassium chloride (K-DUR) 10 MEQ tablet Take 1 tablet (10 mEq total) by mouth daily.  30 tablet  1   No current facility-administered medications on file prior to visit.    Allergies  Allergen Reactions  . Oxycodone-Acetaminophen Hives and Itching    Family History  Problem Relation Age of Onset  . Diabetes Mother   . Hypertension Mother   . Arthritis Mother   . Cancer Mother     uterine and breast cancer  . Hyperlipidemia Father   . Arthritis Father   . Cancer Father     prostate cancer  . Hyperlipidemia Maternal Grandmother   . Hypertension Maternal Grandmother   . Hyperlipidemia  Maternal Grandfather   . Hypertension Maternal Grandfather   . Hyperlipidemia Paternal Grandmother   . Hypertension Paternal Grandmother   . Hyperlipidemia Paternal Grandfather   . Hypertension Paternal Grandfather   . Heart attack Paternal Grandfather   . Sudden death Neg Hx   . Colon cancer Neg Hx   . Esophageal cancer Neg Hx   . Stomach cancer Neg Hx   . Rectal cancer Neg Hx   father recently dx'ed with thyroid cancer Sister had resection of benign goiter.  BP 136/84  Pulse 80  Ht 6' (1.829 m)  Wt 336 lb (152.409 kg)  BMI 45.56 kg/m2  SpO2 97%  Review of Systems denies headache, hoarseness, double vision, palpitations, sob, diarrhea, myalgias, excessive diaphoresis, numbness, tremor, easy bruising, and rhinorrhea.  He has heat intolerance, urinary frequency, anxiety, ED sxs, and weight gain.      Objective:   Physical Exam VS: see vs page GEN: no distress HEAD: head: no deformity eyes: no periorbital swelling, no proptosis external nose and ears are normal mouth: no lesion seen NECK: supple, thyroid is not enlarged CHEST WALL: no deformity LUNGS: clear to auscultation BREASTS:  No gynecomastia CV: reg rate and rhythm, no murmur ABD: abdomen is soft, nontender.  no hepatosplenomegaly.  not distended.  no hernia. MUSCULOSKELETAL: muscle bulk and strength are grossly normal.  no obvious joint swelling.  gait is normal and steady EXTEMITIES: no deformity.  no ulcer on the feet.  feet are of normal color and temp.  no edema PULSES: dorsalis pedis intact bilat.  no carotid bruit NEURO:  cn 2-12 grossly intact.   readily moves all 4's.  sensation is intact to touch on the feet SKIN:  Normal texture and temperature.  No rash or suspicious lesion is visible.   NODES:  None palpable at the neck PSYCH: alert, oriented x3.  Does not appear anxious nor depressed.   Lab Results  Component Value Date   TSH 0.380 01/03/2013   Thyroid US: COMPARISON: Cervical spine MRI  07/29/2011. 08/21/2011  ultrasound-guided biopsy of the dominant right lower pole cystic  nodule. Thyroid ultrasound 08/06/2011.  1. Dominant right thyroid lobe nodule appears more complex than in  2013, but still largely cystic. This was biopsied with ultrasound  guidance on 08/21/2011. Overall size has slightly  diminished, now  measuring up to 3.9 cm.  2. Several subcentimeter left thyroid lobe hypoechoic nodules also  stable to slightly diminished since 2013.  Findings do not meet current SRU consensus criteria for biopsy.  Thyroid bx cytology: THYROID, FINE NEEDLE ASPIRATION, RIGHT BENIGN. FINDINGS CONSISTENT WITH NON-NEOPLASTIC GOITER.    Assessment & Plan:  Multinodular goiter.  Low risk for malignancy. FHx pos for uncertain type if thyroid cancer.  Unlikely to be relevant, but we should verify Weight gain: not thyroid-related.

## 2013-01-18 NOTE — Patient Instructions (Addendum)
Please let us know what type of thyroid cancer you father had. Please return in 1 year. most of the time, a "lumpy thyroid" will eventually become overactive.  this is usually a slow process, happening over the span of many years.

## 2013-01-19 DIAGNOSIS — E042 Nontoxic multinodular goiter: Secondary | ICD-10-CM | POA: Insufficient documentation

## 2013-02-05 ENCOUNTER — Ambulatory Visit (INDEPENDENT_AMBULATORY_CARE_PROVIDER_SITE_OTHER): Admitting: Family Medicine

## 2013-02-05 ENCOUNTER — Encounter: Payer: Self-pay | Admitting: Family Medicine

## 2013-02-05 VITALS — BP 160/80 | HR 77 | Temp 98.1°F | Resp 16 | Wt 338.5 lb

## 2013-02-05 DIAGNOSIS — IMO0002 Reserved for concepts with insufficient information to code with codable children: Secondary | ICD-10-CM

## 2013-02-05 DIAGNOSIS — S335XXA Sprain of ligaments of lumbar spine, initial encounter: Secondary | ICD-10-CM

## 2013-02-05 DIAGNOSIS — S39012A Strain of muscle, fascia and tendon of lower back, initial encounter: Secondary | ICD-10-CM | POA: Insufficient documentation

## 2013-02-05 DIAGNOSIS — S76219A Strain of adductor muscle, fascia and tendon of unspecified thigh, initial encounter: Secondary | ICD-10-CM

## 2013-02-05 MED ORDER — ZOLPIDEM TARTRATE 10 MG PO TABS: 5.0000 mg | ORAL_TABLET | Freq: Every evening | ORAL | Status: DC | PRN

## 2013-02-05 MED ORDER — NAPROXEN 500 MG PO TABS: ORAL_TABLET | ORAL | Status: DC

## 2013-02-05 NOTE — Patient Instructions (Addendum)
Take blood pressure meds this morning - if blood pressure is staying high despite meds, return to see [place]. I think you had a lumbar strain and groin strain - both should improve with time. Take flexeril at home for muscle strain in lower back - may make you sleepy Take naprosyn twice a day with food for 5 days then as needed. Let us know if not improving with this.

## 2013-02-05 NOTE — Assessment & Plan Note (Signed)
groin and lumbar strain suffered at work when he fell, split legs and fell on lower back. No point tenderness of lumbar spine to suggest fracture. Supportive care as per instructions. Pt states he has flexeril at home. I will also prescribe naprosyn to take regularly for next week.

## 2013-02-05 NOTE — Assessment & Plan Note (Signed)
See below

## 2013-02-05 NOTE — Progress Notes (Signed)
  Subjective:    Patient ID: Charles Nash, male    DOB: 1965-11-21, 47 y.o.   MRN: [id number]  HPI CC: injury at work  BP elevated today - has not taken am meds. Requests refill of ambien.  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  Able to get up and sit in chair but felt very stiff.  This morning feeling some better but persistent groin pain.  Main pain left leg.  Treated discomfort by sitting in warm water bath.  Lab Results  Component Value Date   CREATININE 0.89 11/17/2012    Past Medical History  Diagnosis Date  . Hyperlipidemia   . Diabetes mellitus   . Hypertension   . OSA (obstructive sleep apnea)   . Arthritis   . Allergy   . Personal history of colonic polyps   . GERD (gastroesophageal reflux disease)   . Fatty liver 08/06/2011  . Bilateral varicoceles   . Plantar fasciitis     Review of Systems Per HPI    Objective:   Physical Exam  Nursing note and vitals reviewed. Constitutional: He appears well-developed and well-nourished. No distress.  Musculoskeletal: He exhibits no edema.  Mild discomfort lower lumbar spine.   Tender and tightness to palpation right lower lumbar paraspinous mm Neg SLR , no pain with int/ ext rotation hips. Pain at groin muscles, painful with resisted abduction at hip on left.   Skin: Skin is warm and dry.  No bruising of groin       Assessment & Plan:

## 2013-02-09 ENCOUNTER — Ambulatory Visit (INDEPENDENT_AMBULATORY_CARE_PROVIDER_SITE_OTHER): Admitting: Family

## 2013-02-09 ENCOUNTER — Encounter: Payer: Self-pay | Admitting: Family

## 2013-02-09 VITALS — BP 150/90 | HR 93 | Temp 98.6°F | Resp 16 | Ht 73.0 in | Wt 343.0 lb

## 2013-02-09 DIAGNOSIS — E119 Type 2 diabetes mellitus without complications: Secondary | ICD-10-CM

## 2013-02-09 DIAGNOSIS — I10 Essential (primary) hypertension: Secondary | ICD-10-CM

## 2013-02-09 MED ORDER — HYDRALAZINE HCL 25 MG PO TABS: 25.0000 mg | ORAL_TABLET | Freq: Three times a day (TID) | ORAL | Status: DC

## 2013-02-09 NOTE — Assessment & Plan Note (Signed)
4 lb weight gain since 01/18/13.  Education provided on importance of exercise 5 days a week for at least 30 min, to maintain healthy diet with fresh fruits and vegetables, and lean meat.

## 2013-02-09 NOTE — Assessment & Plan Note (Signed)
Patient hypertensive in office.  Continue Tribenzor and Metoprolol XL. Start Hydralazine 25mg  TID. Ordered Renal duplex to rule out secondary causes of hypertension.  Follow up in 1 month.

## 2013-02-09 NOTE — Patient Instructions (Addendum)
Take the metoprolol at bedtime, continue tribezor in the morning.  Start hydralazine 25mg  three times daily. You will be contacted about your ultrasound of your kidneys.   Follow up in 1 month.

## 2013-02-09 NOTE — Progress Notes (Signed)
Subjective:    Patient ID: Charles Nash, male    DOB: 12-Sep-1965, 47 y.o.   MRN: [id number]  HPI Mr. Valdes is 47 y/o male who presents today for follow up.  Diabetes) Patient taking Januvia, patient brought in blood sugar readings from last 2 weeks.  Ranges are from low 100's to 140's.  HTN) Patient taking Tribenzor and Metoprolol XL as directed. Patient reports his home readings are 120's/80's in the morning, but reports his afternoon readings are elevated. Denies chest pain and shortness of breath.    Weight gain)  Patient denies regular exercise, patient reports eating chicken, fish, vegetables, red meat and reports he has cut back on fried, fatty foods.     Review of Systems  Constitutional: Negative for fever and chills.  Respiratory: Negative for cough and shortness of breath.   Cardiovascular: Positive for leg swelling. Negative for chest pain.       Patient reports that his ankles feel swollen for the past several days.  Genitourinary: Negative for dysuria.  Skin: Positive for rash.       Patient reports recent hives all over body.  Patient referred to allergy clinic for evaluation.    Neurological: Negative for headaches.   Past Medical History  Diagnosis Date  . Hyperlipidemia   . Diabetes mellitus   . Hypertension   . OSA (obstructive sleep apnea)   . Arthritis   . Allergy   . Personal history of colonic polyps   . GERD (gastroesophageal reflux disease)   . Fatty liver 08/06/2011  . Bilateral varicoceles   . Plantar fasciitis     History   Social History  . Marital Status: Married    Spouse Name: N/A    Number of Children: 2  . Years of Education: N/A   Occupational History  . FOOTBALL COACH    Social History Main Topics  . Smoking status: Never Smoker   . Smokeless tobacco: Never Used  . Alcohol Use: No     Comment: occasional wine  . Drug Use: No  . Sexual Activity: Not on file   Other Topics Concern  . Not on file   Social History  Narrative   Regular exercise:  No   Caffeine use:  2--32oz cups daily          Past Surgical History  Procedure Laterality Date  . Knee surgery  08/04/08    Right knee-- medial meniscus repair, attenuation anterior cruciate [name] MD [facility])   . Ankle surgery    . Foot surgery    . Shoulder surgery    . Varicocele excision    . Plantar fascia release      Family History  Problem Relation Age of Onset  . Diabetes Mother   . Hypertension Mother   . Arthritis Mother   . Cancer Mother     uterine and breast cancer  . Hyperlipidemia Father   . Arthritis Father   . Cancer Father     prostate cancer  . Hyperlipidemia Maternal Grandmother   . Hypertension Maternal Grandmother   . Hyperlipidemia Maternal Grandfather   . Hypertension Maternal Grandfather   . Hyperlipidemia Paternal Grandmother   . Hypertension Paternal Grandmother   . Hyperlipidemia Paternal Grandfather   . Hypertension Paternal Grandfather   . Heart attack Paternal Grandfather   . Sudden death Neg Hx   . Colon cancer Neg Hx   . Esophageal cancer Neg Hx   . Stomach cancer Neg Hx   .  Rectal cancer Neg Hx     Allergies  Allergen Reactions  . Oxycodone-Acetaminophen Hives and Itching    Current Outpatient Prescriptions on File Prior to Visit  Medication Sig Dispense Refill  . aspirin 81 MG tablet Take 81 mg by mouth daily.        . Blood Glucose Monitoring Suppl (ONE TOUCH ULTRA 2) W/DEVICE KIT Use to check blood sugar once a day.  Dx 250.00  1 each  0  . Blood Pressure Monitoring (SPHYGMOMANOMETER) MISC Use as instructed to check blood pressure. Dx 401.9  1 each  0  . cetirizine (ZYRTEC) 10 MG tablet Take 1 tablet (10 mg total) by mouth daily.  30 tablet  11  . fluticasone (FLONASE) 50 MCG/ACT nasal spray Place 1 spray into the nose daily.      Marland Kitchen glucose blood (ONE TOUCH ULTRA TEST) test strip Use to check blood sugar once daily as instructed. Dx 250.00  50 each  2  . Lancets  (ONETOUCH ULTRASOFT) lancets Use to check blood sugar once daily as instructed.  Dx 250.00  100 each  1  . metoprolol succinate (TOPROL-XL) 100 MG 24 hr tablet Take 1 tablet (100 mg total) by mouth daily. Take with or immediately following a meal.  30 tablet  2  . Multiple Vitamin (MULTIVITAMIN) capsule Take 1 capsule by mouth daily.      . naproxen (NAPROSYN) 500 MG tablet Take one po bid x 1 week then prn pain, take with food  40 tablet  0  . niacin (SLO-NIACIN) 500 MG tablet Take 1 tablet (500 mg total) by mouth at bedtime.  30 tablet  5  . Olmesartan-Amlodipine-HCTZ (TRIBENZOR) 40-10-25 MG TABS Take 1 tablet by mouth daily.  30 tablet  0  . omeprazole (PRILOSEC) 20 MG capsule Take 2 capsules (40 mg total) by mouth every morning.  180 capsule  1  . PAROXETINE HCL PO Take 10 mg by mouth daily.       . potassium chloride (K-DUR,KLOR-CON) 10 MEQ tablet TAKE 1 TABLET (10 MEQ TOTAL) BY MOUTH DAILY.  30 tablet  5  . sildenafil (VIAGRA) 100 MG tablet Take 0.5-1 tablets (50-100 mg total) by mouth daily as needed.  15 tablet  5  . sitaGLIPtin (JANUVIA) 100 MG tablet Take 100 mg by mouth daily.      Marland Kitchen zolpidem (AMBIEN) 10 MG tablet Take 0.5 tablets (5 mg total) by mouth at bedtime as needed.  30 tablet  0  . [DISCONTINUED] potassium chloride (K-DUR) 10 MEQ tablet Take 1 tablet (10 mEq total) by mouth daily.  30 tablet  1   No current facility-administered medications on file prior to visit.    BP 150/90  Pulse 93  Temp(Src) 98.6 F (37 C) (Oral)  Resp 16  Ht 6\' 1"  (1.854 m)  Wt 343 lb (155.584 kg)  BMI 45.26 kg/m2  SpO2 97%        Objective:   Physical Exam  Constitutional: He is oriented to person, place, and time. He appears well-nourished.  HENT:  Head: Normocephalic.  Cardiovascular: Normal rate, regular rhythm and normal heart sounds.   No murmur heard. Pulmonary/Chest: Effort normal and breath sounds normal. No respiratory distress.  Neurological: He is alert and oriented to  person, place, and time.          Assessment & Plan:

## 2013-02-09 NOTE — Assessment & Plan Note (Signed)
Stable.  Patient brought fasting sugar recordings over past two weeks. Ranges from low 100's 140's.  Continue Januvia.

## 2013-02-09 NOTE — Progress Notes (Signed)
  Subjective:    Patient ID: Charles Nash, male    DOB: 04-09-1966, 47 y.o.   MRN: [id number]  HPI    Review of Systems     Objective:   Physical Exam        Assessment & Plan:

## 2013-02-10 ENCOUNTER — Encounter (HOSPITAL_BASED_OUTPATIENT_CLINIC_OR_DEPARTMENT_OTHER): Payer: Self-pay | Admitting: Emergency Medicine

## 2013-02-10 ENCOUNTER — Emergency Department (HOSPITAL_BASED_OUTPATIENT_CLINIC_OR_DEPARTMENT_OTHER)
Admission: EM | Admit: 2013-02-10 | Discharge: 2013-02-10 | Disposition: A | Discharge status: Home / Self Care | Attending: Emergency Medicine | Admitting: Emergency Medicine

## 2013-02-10 DIAGNOSIS — R42 Dizziness and giddiness: Secondary | ICD-10-CM | POA: Insufficient documentation

## 2013-02-10 DIAGNOSIS — M6281 Muscle weakness (generalized): Secondary | ICD-10-CM | POA: Insufficient documentation

## 2013-02-10 DIAGNOSIS — Z860100 Personal history of colon polyps, unspecified: Secondary | ICD-10-CM | POA: Insufficient documentation

## 2013-02-10 DIAGNOSIS — I10 Essential (primary) hypertension: Secondary | ICD-10-CM | POA: Insufficient documentation

## 2013-02-10 DIAGNOSIS — Z7982 Long term (current) use of aspirin: Secondary | ICD-10-CM | POA: Insufficient documentation

## 2013-02-10 DIAGNOSIS — E119 Type 2 diabetes mellitus without complications: Secondary | ICD-10-CM | POA: Insufficient documentation

## 2013-02-10 DIAGNOSIS — Z79899 Other long term (current) drug therapy: Secondary | ICD-10-CM | POA: Insufficient documentation

## 2013-02-10 DIAGNOSIS — K219 Gastro-esophageal reflux disease without esophagitis: Secondary | ICD-10-CM | POA: Insufficient documentation

## 2013-02-10 DIAGNOSIS — M129 Arthropathy, unspecified: Secondary | ICD-10-CM | POA: Insufficient documentation

## 2013-02-10 DIAGNOSIS — Z8669 Personal history of other diseases of the nervous system and sense organs: Secondary | ICD-10-CM | POA: Insufficient documentation

## 2013-02-10 NOTE — ED Notes (Signed)
Patient states his BP is elevated

## 2013-02-10 NOTE — ED Provider Notes (Signed)
This chart was scribed for Charles Maw Janean Eischen, DO by Charles Nash, ED Scribe. This patient was seen in room MH11/MH11 and the patient's care was started at 6:18 PM  TIME SEEN: 6:18 PM  CHIEF COMPLAINT: Hypertension  HPI:  HPI Comments: Charles Nash is a 47 y.o. male with a history of HTN who presents to the Emergency Department complaining of hypertension, with elevated blood pressure at home over the past week. He states that his BP at home was 179/104 earlier today, which he states concerned him. His ED blood pressure is 168/82. He states that he is on Tribenzor and Metoprolol for his management of his blood pressure, and that he recently began taking hydralazine as well.  He denies headache, chest pain, SOB, blurred vision, vision loss, numbness/weakness/tingling on one side of his body or any other symptoms.  He reports since taking hydralazine he has had intermittent episodes of lightheadedness that lasts for only several seconds and then resolve. He is not currently dizzy now.  ROS: See HPI Constitutional: no fever  Eyes: no drainage, no blurred vision, no vision loss ENT: no runny nose   Cardiovascular:  no chest pain  Resp: no SOB  GI: no vomiting GU: no dysuria Integumentary: no rash  Allergy: no hives  Musculoskeletal: no leg swelling  Neurological: + bilateral leg weakness, +dizziness, no slurred speech, no headaches, no lateralizing numbness/weakness or tingling  ROS otherwise negative  PAST MEDICAL HISTORY/PAST SURGICAL HISTORY:  Past Medical History  Diagnosis Date  . Hyperlipidemia   . Diabetes mellitus   . Hypertension   . OSA (obstructive sleep apnea)   . Arthritis   . Allergy   . Personal history of colonic polyps   . GERD (gastroesophageal reflux disease)   . Fatty liver 08/06/2011  . Bilateral varicoceles   . Plantar fasciitis     MEDICATIONS:  Prior to Admission medications   Medication Sig Start Date End Date Taking? Authorizing Provider  aspirin  81 MG tablet Take 81 mg by mouth daily.      Historical Provider, MD  Blood Glucose Monitoring Suppl (ONE TOUCH ULTRA 2) W/DEVICE KIT Use to check blood sugar once a day.  Dx 250.00 05/13/12   Sandford Craze, NP  Blood Pressure Monitoring (SPHYGMOMANOMETER) MISC Use as instructed to check blood pressure. Dx 401.9 12/27/12   Sandford Craze, NP  cetirizine (ZYRTEC) 10 MG tablet Take 1 tablet (10 mg total) by mouth daily. 02/02/12   Sandford Craze, NP  fluticasone (FLONASE) 50 MCG/ACT nasal spray Place 1 spray into the nose daily. 05/19/11   Sandford Craze, NP  glucose blood (ONE TOUCH ULTRA TEST) test strip Use to check blood sugar once daily as instructed. Dx 250.00 05/13/12   Sandford Craze, NP  hydrALAZINE (APRESOLINE) 25 MG tablet Take 1 tablet (25 mg total) by mouth 3 (three) times daily. 02/09/13   Sandford Craze, NP  Lancets (ONETOUCH ULTRASOFT) lancets Use to check blood sugar once daily as instructed.  Dx 250.00 01/17/13   Sandford Craze, NP  metoprolol succinate (TOPROL-XL) 100 MG 24 hr tablet Take 1 tablet (100 mg total) by mouth daily. Take with or immediately following a meal. 01/17/13   Sandford Craze, NP  Multiple Vitamin (MULTIVITAMIN) capsule Take 1 capsule by mouth daily.    Historical Provider, MD  naproxen (NAPROSYN) 500 MG tablet Take one po bid x 1 week then prn pain, take with food 02/05/13   Eustaquio Boyden, MD  niacin (SLO-NIACIN) 500 MG tablet Take 1  tablet (500 mg total) by mouth at bedtime. 11/17/12   Sandford Craze, NP  Olmesartan-Amlodipine-HCTZ (TRIBENZOR) 40-10-25 MG TABS Take 1 tablet by mouth daily. 12/27/12   Sandford Craze, NP  omeprazole (PRILOSEC) 20 MG capsule Take 2 capsules (40 mg total) by mouth every morning. 08/09/12   Sandford Craze, NP  PAROXETINE HCL PO Take 10 mg by mouth daily.     Historical Provider, MD  potassium chloride (K-DUR,KLOR-CON) 10 MEQ tablet TAKE 1 TABLET (10 MEQ TOTAL) BY MOUTH DAILY. 12/04/11   Edwyna Perfect, MD  sildenafil (VIAGRA) 100 MG tablet Take 0.5-1 tablets (50-100 mg total) by mouth daily as needed. 11/17/12   Sandford Craze, NP  sitaGLIPtin (JANUVIA) 100 MG tablet Take 100 mg by mouth daily.    Historical Provider, MD  zolpidem (AMBIEN) 10 MG tablet Take 0.5 tablets (5 mg total) by mouth at bedtime as needed. 02/05/13   Eustaquio Boyden, MD    ALLERGIES:  Allergies  Allergen Reactions  . Oxycodone-Acetaminophen Hives and Itching    SOCIAL HISTORY:  History  Substance Use Topics  . Smoking status: Never Smoker   . Smokeless tobacco: Never Used  . Alcohol Use: No     Comment: occasional wine    FAMILY HISTORY: Family History  Problem Relation Age of Onset  . Diabetes Mother   . Hypertension Mother   . Arthritis Mother   . Cancer Mother     uterine and breast cancer  . Hyperlipidemia Father   . Arthritis Father   . Cancer Father     prostate cancer  . Hyperlipidemia Maternal Grandmother   . Hypertension Maternal Grandmother   . Hyperlipidemia Maternal Grandfather   . Hypertension Maternal Grandfather   . Hyperlipidemia Paternal Grandmother   . Hypertension Paternal Grandmother   . Hyperlipidemia Paternal Grandfather   . Hypertension Paternal Grandfather   . Heart attack Paternal Grandfather   . Sudden death Neg Hx   . Colon cancer Neg Hx   . Esophageal cancer Neg Hx   . Stomach cancer Neg Hx   . Rectal cancer Neg Hx     EXAM: BP 168/82  Pulse 102  Temp(Src) 98.2 F (36.8 C) (Oral)  Resp 18  SpO2 98% CONSTITUTIONAL: Alert and oriented and responds appropriately to questions. Well-appearing; well-nourished HEAD: Normocephalic EYES: Conjunctivae clear, PERRL ENT: normal nose; no rhinorrhea; moist mucous membranes; pharynx without lesions noted NECK: Supple, no meningismus, no LAD  CARD: RRR; S1 and S2 appreciated; no murmurs, no clicks, no rubs, no gallops RESP: Normal chest excursion without splinting or tachypnea; breath sounds clear and equal  bilaterally; no wheezes, no rhonchi, no rales,  ABD/GI: Normal bowel sounds; non-distended; soft, non-tender, no rebound, no guarding BACK:  The back appears normal and is non-tender to palpation, there is no CVA tenderness EXT: Normal ROM in all joints; non-tender to palpation; no edema; normal capillary refill; no cyanosis    SKIN: Normal color for age and race; warm NEURO: Moves all extremities equally. Strength 5/5 in all four extremities. Cranial nerves 2-12 intact. Sensation to light touch intact diffusely. No dysmetria to finger to nose testing. PSYCH: The patient's mood and manner are appropriate. Grooming and personal hygiene are appropriate.  MEDICAL DECISION MAKING: Patient with concern for hypertension. His blood pressure is 168/82 in the emergency department and he is completely asymptomatic. His exam is benign, he is neurologically intact. Have recommended that he check his blood pressure in the morning approximately one hour after taking his Tribenzor  and hydralazine.  Have recommended that he keep a Journal to long his blood pressures in the am and whenever he has any abnormal symptoms. Given strict return precautions. He is followed by Corinda Gubler and reports he'll talk to his primary care doctor on Monday. I do not feel there is any other intervention needed at this time given he is hemodynamically stable and asymptomatic currently. Patient verbalizes understanding and is comfortable with this plan.        Charles Maw Nasiyah Laverdiere, DO 02/10/13 [phone]

## 2013-02-14 ENCOUNTER — Telehealth: Payer: Self-pay | Admitting: Family

## 2013-02-14 MED ORDER — ASPIRIN 81 MG PO TABS: 81.0000 mg | ORAL_TABLET | Freq: Every day | ORAL | Status: DC

## 2013-02-14 NOTE — Telephone Encounter (Signed)
Patient states that he needs a refill of aspirin to be sent to [facility] pharmacy. He says that he is on his way there now to pick up another med and would like to know if this could be done. Thanks!

## 2013-02-14 NOTE — Telephone Encounter (Signed)
Rx sent to pharmacy   

## 2013-02-15 ENCOUNTER — Ambulatory Visit (HOSPITAL_COMMUNITY): Attending: Family

## 2013-02-15 DIAGNOSIS — E119 Type 2 diabetes mellitus without complications: Secondary | ICD-10-CM | POA: Insufficient documentation

## 2013-02-15 DIAGNOSIS — E785 Hyperlipidemia, unspecified: Secondary | ICD-10-CM | POA: Insufficient documentation

## 2013-02-15 DIAGNOSIS — I10 Essential (primary) hypertension: Secondary | ICD-10-CM | POA: Insufficient documentation

## 2013-02-16 ENCOUNTER — Encounter: Payer: Self-pay | Admitting: Family

## 2013-02-18 ENCOUNTER — Telehealth: Payer: Self-pay | Admitting: *Deleted

## 2013-02-18 NOTE — Telephone Encounter (Signed)
Continue current meds until his 1 month follow up apt. Renal duplex normal (see mychart message).

## 2013-02-18 NOTE — Telephone Encounter (Signed)
Received call from pt and he states his BP readings all week have been around 130s - 180s over 80s-90s (lying down). Pt would also like to know his renal duplex results.  Please advise.

## 2013-02-21 NOTE — Telephone Encounter (Signed)
Notified pt and he voices understanding. 

## 2013-02-23 ENCOUNTER — Other Ambulatory Visit: Payer: Self-pay | Admitting: Family

## 2013-03-10 ENCOUNTER — Other Ambulatory Visit: Payer: Self-pay | Admitting: Family

## 2013-03-11 NOTE — Telephone Encounter (Signed)
Rx request to pharmacy/[facility]  

## 2013-03-16 ENCOUNTER — Ambulatory Visit (INDEPENDENT_AMBULATORY_CARE_PROVIDER_SITE_OTHER): Admitting: Family

## 2013-03-16 ENCOUNTER — Encounter: Payer: Self-pay | Admitting: Family

## 2013-03-16 VITALS — BP 140/94 | HR 76 | Temp 98.1°F | Resp 16 | Ht 73.0 in | Wt 337.1 lb

## 2013-03-16 DIAGNOSIS — I10 Essential (primary) hypertension: Secondary | ICD-10-CM

## 2013-03-16 MED ORDER — HYDRALAZINE HCL 50 MG PO TABS: 50.0000 mg | ORAL_TABLET | Freq: Three times a day (TID) | ORAL | Status: DC

## 2013-03-16 NOTE — Patient Instructions (Signed)
Increase hydralazine to 50mg  three times a day.   Follow up in 2 weeks.

## 2013-03-16 NOTE — Assessment & Plan Note (Addendum)
BP improving but still above goal.  He is starting concerta.  Advised pt to increase hydralazine from 25 mg to 50mg  TID and to follow up in in 1 week.

## 2013-03-16 NOTE — Progress Notes (Signed)
Subjective:    Patient ID: Charles Nash, male    DOB: [birth year], 47 y.o.   MRN: [id number]  HPI  Charles Nash is a 47 yr old male who presents today for follow up of HTN.  Last visit blood pressure was noted to be elevated on toprol xl and tribenzor.  Hydralazine tid was added to his regimen. He also underwent a renal artery doppler which was normal.   BP Readings from Last 3 Encounters:  03/16/13 140/94  02/10/13 168/82  02/09/13 150/90   PTSD- seeing psychiatrist at [facility].  Reported difficulty focusing. Was given rx for concerta.  Paxil was changed to lexapro.     Review of Systems    see HPI  Past Medical History  Diagnosis Date  . Hyperlipidemia   . Diabetes mellitus   . Hypertension   . OSA (obstructive sleep apnea)   . Arthritis   . Allergy   . Personal history of colonic polyps   . GERD (gastroesophageal reflux disease)   . Fatty liver 08/06/2011  . Bilateral varicoceles   . Plantar fasciitis     History   Social History  . Marital Status: Married    Spouse Name: N/A    Number of Children: 2  . Years of Education: N/A   Occupational History  . FOOTBALL COACH    Social History Main Topics  . Smoking status: Never Smoker   . Smokeless tobacco: Never Used  . Alcohol Use: No     Comment: occasional wine  . Drug Use: No  . Sexual Activity: Not on file   Other Topics Concern  . Not on file   Social History Narrative   Regular exercise:  No   Caffeine use:  2--32oz cups daily          Past Surgical History  Procedure Laterality Date  . Knee surgery  08/04/08    Right knee-- medial meniscus repair, attenuation anterior cruciate Charles Garter MD [facility])   . Ankle surgery    . Foot surgery    . Shoulder surgery    . Varicocele excision    . Plantar fascia release      Family History  Problem Relation Age of Onset  . Diabetes Mother   . Hypertension Mother   . Arthritis Mother   . Cancer Mother     uterine and breast  cancer  . Hyperlipidemia Father   . Arthritis Father   . Cancer Father     prostate cancer  . Hyperlipidemia Maternal Grandmother   . Hypertension Maternal Grandmother   . Hyperlipidemia Maternal Grandfather   . Hypertension Maternal Grandfather   . Hyperlipidemia Paternal Grandmother   . Hypertension Paternal Grandmother   . Hyperlipidemia Paternal Grandfather   . Hypertension Paternal Grandfather   . Heart attack Paternal Grandfather   . Sudden death Neg Hx   . Colon cancer Neg Hx   . Esophageal cancer Neg Hx   . Stomach cancer Neg Hx   . Rectal cancer Neg Hx     Allergies  Allergen Reactions  . Oxycodone-Acetaminophen Hives and Itching    Current Outpatient Prescriptions on File Prior to Visit  Medication Sig Dispense Refill  . aspirin 81 MG tablet Take 1 tablet (81 mg total) by mouth daily.  100 tablet  3  . Blood Glucose Monitoring Suppl (ONE TOUCH ULTRA 2) W/DEVICE KIT Use to check blood sugar once a day.  Dx 250.00  1 each  0  .  Blood Pressure Monitoring (SPHYGMOMANOMETER) MISC Use as instructed to check blood pressure. Dx 401.9  1 each  0  . cetirizine (ZYRTEC) 10 MG tablet Take 1 tablet (10 mg total) by mouth daily.  30 tablet  11  . fluticasone (FLONASE) 50 MCG/ACT nasal spray Place 1 spray into the nose daily.      Marland Kitchen glucose blood (ONE TOUCH ULTRA TEST) test strip Use to check blood sugar once daily as instructed. Dx 250.00  50 each  2  . hydrALAZINE (APRESOLINE) 25 MG tablet Take 1 tablet (25 mg total) by mouth 3 (three) times daily.  90 tablet  0  . Lancets (ONETOUCH ULTRASOFT) lancets Use to check blood sugar once daily as instructed.  Dx 250.00  100 each  1  . metoprolol succinate (TOPROL-XL) 100 MG 24 hr tablet Take 1 tablet (100 mg total) by mouth daily. Take with or immediately following a meal.  30 tablet  2  . Multiple Vitamin (MULTIVITAMIN) capsule Take 1 capsule by mouth daily.      . naproxen (NAPROSYN) 500 MG tablet Take one po bid x 1 week then prn pain,  take with food  40 tablet  0  . niacin (SLO-NIACIN) 500 MG tablet Take 1 tablet (500 mg total) by mouth at bedtime.  30 tablet  5  . omeprazole (PRILOSEC) 20 MG capsule TAKE 2 CAPSULES (40 MG) EVERY MORNING  180 capsule  0  . potassium chloride (K-DUR,KLOR-CON) 10 MEQ tablet TAKE 1 TABLET (10 MEQ TOTAL) BY MOUTH DAILY.  30 tablet  5  . sitaGLIPtin (JANUVIA) 100 MG tablet Take 100 mg by mouth daily.      Charles Nash 40-10-25 MG TABS TAKE 1 TABLET DAILY  90 tablet  0  . VIAGRA 100 MG tablet TAKE ONE-HALF (1/2) TO 1 TABLET (50 MG TO 100 MG) DAILY AS NEEDED  15 tablet  0  . [DISCONTINUED] potassium chloride (K-DUR) 10 MEQ tablet Take 1 tablet (10 mEq total) by mouth daily.  30 tablet  1   No current facility-administered medications on file prior to visit.    BP 140/94  Pulse 76  Temp(Src) 98.1 F (36.7 C) (Oral)  Resp 16  Ht 6\' 1"  (1.854 m)  Wt 337 lb 1.9 oz (152.917 kg)  BMI 44.49 kg/m2  SpO2 99%    Objective:   Physical Exam  Constitutional: He is oriented to person, place, and time. He appears well-developed and well-nourished. No distress.  Cardiovascular: Normal rate and regular rhythm.   No murmur heard. Pulmonary/Chest: Effort normal and breath sounds normal. No respiratory distress. He has no wheezes. He has no rales. He exhibits no tenderness.  Musculoskeletal: He exhibits no edema.  Neurological: He is alert and oriented to person, place, and time.  Psychiatric: He has a normal mood and affect. His behavior is normal. Judgment and thought content normal.          Assessment & Plan:

## 2013-03-30 ENCOUNTER — Encounter: Payer: Self-pay | Admitting: Family

## 2013-03-30 ENCOUNTER — Ambulatory Visit (INDEPENDENT_AMBULATORY_CARE_PROVIDER_SITE_OTHER): Admitting: Family

## 2013-03-30 VITALS — BP 136/100 | HR 62 | Temp 97.9°F | Resp 16 | Ht 73.0 in | Wt 334.1 lb

## 2013-03-30 DIAGNOSIS — I10 Essential (primary) hypertension: Secondary | ICD-10-CM

## 2013-03-30 MED ORDER — SILDENAFIL CITRATE 100 MG PO TABS: ORAL_TABLET | ORAL | Status: DC

## 2013-03-30 MED ORDER — MULTIVITAMINS PO CAPS: 1.0000 | ORAL_CAPSULE | Freq: Every day | ORAL | Status: DC

## 2013-03-30 NOTE — Assessment & Plan Note (Signed)
Blood pressure improved, but still having some high readings. Advised him to try to be diligent re: evening dose of hydralazine, keep record of readings, send me readings in 1 week.

## 2013-03-30 NOTE — Patient Instructions (Signed)
Make sure that you get all three doses of hydralazine in. Check you blood pressure twice daily, record and e-mail me your readings in 1 week. Follow up in 2 months.

## 2013-03-30 NOTE — Progress Notes (Signed)
Pre visit review using our clinic review tool, if applicable. No additional management support is needed unless otherwise documented below in the visit note. 

## 2013-03-30 NOTE — Progress Notes (Signed)
Subjective:    Patient ID: Charles Nash, male    DOB: [birth year], 47 y.o.   MRN: [id number]  HPI  Charles Nash is a 47 yr old male who presents today for follow up of his HTN. Last visit hydralazine was increased from 25mg  to 50mg  three times daily.  He is also maintained on Toprol XL 100mg  and Tribenzor 40-10-25. Heart rate is 62.  Admits that he sometimes does not take the evening dose.  Last night BP 138/74, other readings 14/82, 139/94.  Denies CP/SOB or swelling.  BP Readings from Last 3 Encounters:  03/30/13 136/100  03/16/13 140/94  02/10/13 168/82     Review of Systems See HPI  Past Medical History  Diagnosis Date  . Hyperlipidemia   . Diabetes mellitus   . Hypertension   . OSA (obstructive sleep apnea)   . Arthritis   . Allergy   . Personal history of colonic polyps   . GERD (gastroesophageal reflux disease)   . Fatty liver 08/06/2011  . Bilateral varicoceles   . Plantar fasciitis   . ADHD (attention deficit hyperactivity disorder)   . PTSD (post-traumatic stress disorder)     History   Social History  . Marital Status: Married    Spouse Name: N/A    Number of Children: 2  . Years of Education: N/A   Occupational History  . FOOTBALL COACH    Social History Main Topics  . Smoking status: Never Smoker   . Smokeless tobacco: Never Used  . Alcohol Use: No     Comment: occasional wine  . Drug Use: No  . Sexual Activity: Not on file   Other Topics Concern  . Not on file   Social History Narrative   Regular exercise:  No   Caffeine use:  2--32oz cups daily          Past Surgical History  Procedure Laterality Date  . Knee surgery  08/04/08    Right knee-- medial meniscus repair, attenuation anterior cruciate Charles Garter MD [facility])   . Ankle surgery    . Foot surgery    . Shoulder surgery    . Varicocele excision    . Plantar fascia release      Family History  Problem Relation Age of Onset  . Diabetes Mother   .  Hypertension Mother   . Arthritis Mother   . Cancer Mother     uterine and breast cancer  . Hyperlipidemia Father   . Arthritis Father   . Cancer Father     prostate cancer  . Hyperlipidemia Maternal Grandmother   . Hypertension Maternal Grandmother   . Hyperlipidemia Maternal Grandfather   . Hypertension Maternal Grandfather   . Hyperlipidemia Paternal Grandmother   . Hypertension Paternal Grandmother   . Hyperlipidemia Paternal Grandfather   . Hypertension Paternal Grandfather   . Heart attack Paternal Grandfather   . Sudden death Neg Hx   . Colon cancer Neg Hx   . Esophageal cancer Neg Hx   . Stomach cancer Neg Hx   . Rectal cancer Neg Hx     Allergies  Allergen Reactions  . Oxycodone-Acetaminophen Hives and Itching    Current Outpatient Prescriptions on File Prior to Visit  Medication Sig Dispense Refill  . aspirin 81 MG tablet Take 1 tablet (81 mg total) by mouth daily.  100 tablet  3  . Blood Glucose Monitoring Suppl (ONE TOUCH ULTRA 2) W/DEVICE KIT Use to check blood sugar once a  day.  Dx 250.00  1 each  0  . Blood Pressure Monitoring (SPHYGMOMANOMETER) MISC Use as instructed to check blood pressure. Dx 401.9  1 each  0  . cetirizine (ZYRTEC) 10 MG tablet Take 1 tablet (10 mg total) by mouth daily.  30 tablet  11  . escitalopram (LEXAPRO) 10 MG tablet Take 10 mg by mouth daily.      . hydrALAZINE (APRESOLINE) 50 MG tablet Take 1 tablet (50 mg total) by mouth 3 (three) times daily.  90 tablet  1  . methylphenidate (CONCERTA) 27 MG CR tablet Take 27 mg by mouth every morning.      . metoprolol succinate (TOPROL-XL) 100 MG 24 hr tablet Take 1 tablet (100 mg total) by mouth daily. Take with or immediately following a meal.  30 tablet  2  . Multiple Vitamin (MULTIVITAMIN) capsule Take 1 capsule by mouth daily.      Marland Kitchen omeprazole (PRILOSEC) 20 MG capsule TAKE 2 CAPSULES (40 MG) EVERY MORNING  180 capsule  0  . potassium chloride (K-DUR,KLOR-CON) 10 MEQ tablet TAKE 1 TABLET  (10 MEQ TOTAL) BY MOUTH DAILY.  30 tablet  5  . sitaGLIPtin (JANUVIA) 100 MG tablet Take 100 mg by mouth daily.      Marya Landry 40-10-25 MG TABS TAKE 1 TABLET DAILY  90 tablet  0  . VIAGRA 100 MG tablet TAKE ONE-HALF (1/2) TO 1 TABLET (50 MG TO 100 MG) DAILY AS NEEDED  15 tablet  0  . zolpidem (AMBIEN) 10 MG tablet Take 10 mg by mouth at bedtime as needed.      Marland Kitchen glucose blood (ONE TOUCH ULTRA TEST) test strip Use to check blood sugar once daily as instructed. Dx 250.00  50 each  2  . Lancets (ONETOUCH ULTRASOFT) lancets Use to check blood sugar once daily as instructed.  Dx 250.00  100 each  1  . niacin (SLO-NIACIN) 500 MG tablet Take 1 tablet (500 mg total) by mouth at bedtime.  30 tablet  5  . [DISCONTINUED] potassium chloride (K-DUR) 10 MEQ tablet Take 1 tablet (10 mEq total) by mouth daily.  30 tablet  1   No current facility-administered medications on file prior to visit.    BP 136/100  Pulse 62  Temp(Src) 97.9 F (36.6 C) (Oral)  Resp 16  Ht 6\' 1"  (1.854 m)  Wt 334 lb 1.9 oz (151.556 kg)  BMI 44.09 kg/m2  SpO2 97%       Objective:   Physical Exam  Constitutional: He is oriented to person, place, and time. He appears well-developed and well-nourished. No distress.  Cardiovascular: Normal rate and regular rhythm.   No murmur heard. Pulmonary/Chest: Effort normal and breath sounds normal. No respiratory distress. He has no wheezes. He has no rales. He exhibits no tenderness.  Neurological: He is alert and oriented to person, place, and time.  Psychiatric: He has a normal mood and affect. His behavior is normal. Judgment and thought content normal.          Assessment & Plan:

## 2013-04-19 ENCOUNTER — Telehealth: Payer: Self-pay | Admitting: Family

## 2013-04-19 MED ORDER — METOPROLOL SUCCINATE ER 100 MG PO TB24: 100.0000 mg | ORAL_TABLET | Freq: Every day | ORAL | Status: DC

## 2013-04-19 NOTE — Telephone Encounter (Signed)
Patient is out of metroprolol 100 mg needs refill today

## 2013-04-19 NOTE — Telephone Encounter (Signed)
Refill sent.  Left detailed message on cell #.

## 2013-04-24 ENCOUNTER — Other Ambulatory Visit: Payer: Self-pay | Admitting: Family

## 2013-05-11 ENCOUNTER — Telehealth: Payer: Self-pay | Admitting: *Deleted

## 2013-05-11 NOTE — Telephone Encounter (Signed)
Pt dropped off lab results from the [facility] and wanted to know if we could review them and tell him what they mean?  Also states that he has not been taking his 3rd dose of hydralazine for 1 week due to lower BP readings.  States when his evening dose of hydralazine is due his BP is running between 120/60s to 130s / 60s. Morning readings between 130s / 60s to 140s / 70s. Also says he has been taking metoprolol at night. Please advise if ok to continue this dose?

## 2013-05-11 NOTE — Telephone Encounter (Addendum)
BP Readings from Last 3 Encounters:  03/30/13 136/100  03/16/13 140/94  02/10/13 168/82   Our readings have been higher in the office.  I would still recommend that he take the 3rd dose of hydralazine.  OK to take metoprolol at night.   Lab from [place] show liver function testing is elevated.  This is not new for him and most likely due to his fatty liver- diet exercise, weight loss can help. Kidney function, sugar, electrolytes look good.

## 2013-05-11 NOTE — Telephone Encounter (Signed)
Left detailed message on cell and to call if any questions. 

## 2013-05-19 ENCOUNTER — Telehealth: Payer: Self-pay | Admitting: Family

## 2013-05-19 ENCOUNTER — Other Ambulatory Visit: Payer: Self-pay | Admitting: Family

## 2013-05-19 NOTE — Telephone Encounter (Signed)
Patient would like to know if Charles Nash could start sending his [place] medication to [facility]?

## 2013-05-20 MED ORDER — SITAGLIPTIN PHOSPHATE 100 MG PO TABS: 100.0000 mg | ORAL_TABLET | Freq: Every day | ORAL | Status: DC

## 2013-05-20 NOTE — Telephone Encounter (Signed)
Refills sent. Notified pt. 

## 2013-05-22 ENCOUNTER — Other Ambulatory Visit: Payer: Self-pay | Admitting: Family

## 2013-05-23 NOTE — Telephone Encounter (Signed)
Rx request to pharmacy/[facility]  

## 2013-05-27 ENCOUNTER — Encounter: Payer: Self-pay | Admitting: Family

## 2013-06-22 ENCOUNTER — Telehealth: Payer: Self-pay | Admitting: *Deleted

## 2013-06-22 MED ORDER — HYDRALAZINE HCL 50 MG PO TABS: 50.0000 mg | ORAL_TABLET | Freq: Three times a day (TID) | ORAL | Status: DC

## 2013-06-22 NOTE — Telephone Encounter (Signed)
Pt called requesting refill of hydralazine.  Refills sent to pharmacy.  Advised pt of need to be seen for follow up.  Last office visit 03/2013 and advised f/u in 2 months.  Scheduled appt for 07/05/13 @ 1:30pm.

## 2013-06-27 ENCOUNTER — Encounter: Payer: Self-pay | Admitting: *Deleted

## 2013-07-05 ENCOUNTER — Ambulatory Visit (INDEPENDENT_AMBULATORY_CARE_PROVIDER_SITE_OTHER): Admitting: Family

## 2013-07-05 ENCOUNTER — Encounter: Payer: Self-pay | Admitting: Family

## 2013-07-05 VITALS — BP 144/94 | HR 66 | Temp 98.0°F | Resp 18 | Ht 73.0 in | Wt 310.1 lb

## 2013-07-05 DIAGNOSIS — E785 Hyperlipidemia, unspecified: Secondary | ICD-10-CM

## 2013-07-05 DIAGNOSIS — N5089 Other specified disorders of the male genital organs: Secondary | ICD-10-CM

## 2013-07-05 DIAGNOSIS — Z23 Encounter for immunization: Secondary | ICD-10-CM

## 2013-07-05 DIAGNOSIS — I10 Essential (primary) hypertension: Secondary | ICD-10-CM

## 2013-07-05 DIAGNOSIS — F4310 Post-traumatic stress disorder, unspecified: Secondary | ICD-10-CM | POA: Insufficient documentation

## 2013-07-05 DIAGNOSIS — E119 Type 2 diabetes mellitus without complications: Secondary | ICD-10-CM

## 2013-07-05 DIAGNOSIS — N508 Other specified disorders of male genital organs: Secondary | ICD-10-CM

## 2013-07-05 LAB — HEMOGLOBIN A1C
Hgb A1c MFr Bld: 6.2 % — ABNORMAL HIGH (ref <5.7)
MEAN PLASMA GLUCOSE: 131 mg/dL — AB (ref <117)

## 2013-07-05 LAB — BASIC METABOLIC PANEL WITH GFR
BUN: 8 mg/dL (ref 6–23)
CO2: 27 meq/L (ref 19–32)
Calcium: 9.4 mg/dL (ref 8.4–10.5)
Chloride: 102 mEq/L (ref 96–112)
Creat: 0.83 mg/dL (ref 0.50–1.35)
GFR, Est Non African American: >89 mL/min
GLUCOSE: 96 mg/dL (ref 70–99)
Potassium: 3.7 mEq/L (ref 3.5–5.3)
SODIUM: 138 meq/L (ref 135–145)

## 2013-07-05 LAB — LIPID PANEL
CHOL/HDL RATIO: 4.2 ratio
CHOLESTEROL: 193 mg/dL (ref 0–200)
HDL: 46 mg/dL (ref >39)
LDL Cholesterol: 101 mg/dL — ABNORMAL HIGH (ref 0–99)
Triglycerides: 231 mg/dL — ABNORMAL HIGH (ref <150)
VLDL: 46 mg/dL — AB (ref 0–40)

## 2013-07-05 LAB — HEPATIC FUNCTION PANEL
ALT: 52 U/L (ref 0–53)
AST: 35 U/L (ref 0–37)
Albumin: 4.5 g/dL (ref 3.5–5.2)
Alkaline Phosphatase: 75 U/L (ref 39–117)
Bilirubin, Direct: 0.2 mg/dL (ref 0.0–0.3)
Indirect Bilirubin: 0.7 mg/dL (ref 0.2–1.2)
Total Bilirubin: 0.9 mg/dL (ref 0.2–1.2)
Total Protein: 7.5 g/dL (ref 6.0–8.3)

## 2013-07-05 NOTE — Assessment & Plan Note (Signed)
Continue januvia, obtain A1C.

## 2013-07-05 NOTE — Assessment & Plan Note (Signed)
I commended him on his healthy diet, exercise and weight loss.

## 2013-07-05 NOTE — Patient Instructions (Addendum)
Please complete your lab work prior to leaving. Schedule testicular ultrasound on the first floor.   Keep up the good work with diet, exercise, weight loss. Follow up in 3 months.

## 2013-07-05 NOTE — Progress Notes (Signed)
Pre visit review using our clinic review tool, if applicable. No additional management support is needed unless otherwise documented below in the visit note. 

## 2013-07-05 NOTE — Assessment & Plan Note (Signed)
Stable on lexapro.  Management per psych at the [facility].

## 2013-07-05 NOTE — Progress Notes (Signed)
Subjective:    Patient ID: Charles Nash, male    DOB: Aug 14, 1965, 48 y.o.   MRN: [id number]  HPI  Charles Nash is a 48 yr old male who presents today for follow up.  HTN-  Reports that he received $RemoveBef'25mg'cPgPIdcTlt$  instead of $RemoveBe'50mg'SSypYasxr$  dose of hydralazine from the [place]. Current BP meds include hydralazine, metoprolol, and tribenzor.  Reports that he just started the $RemoveBef'50mg'luWEkFhjNA$  dose this AM.  Reports that his BP went to 107/50 when he was on hydralazine $RemoveBefore'50mg'AprVSKpmxSCEM$  TID.  Reports BP control was great on 50 mg bid dosing.   BP Readings from Last 3 Encounters:  07/05/13 144/94  03/30/13 136/100  03/16/13 140/94   DM2- current meds include januvia.Last A1C 7.1. Reports that he has not been checking his sugar.    PTSD/Depression/anxiety- maintained on lexapro. [facility]   Obesity- has lost 27 pounds since christmas. Eating right and getting regular exercise.  Wt Readings from Last 3 Encounters:  07/05/13 310 lb 1.3 oz (140.651 kg)  03/30/13 334 lb 1.9 oz (151.556 kg)  03/16/13 337 lb 1.9 oz (152.917 kg)      Review of Systems See HPI  Past Medical History  Diagnosis Date  . Hyperlipidemia   . Diabetes mellitus   . Hypertension   . OSA (obstructive sleep apnea)   . Arthritis   . Allergy   . Personal history of colonic polyps   . GERD (gastroesophageal reflux disease)   . Fatty liver 08/06/2011  . Bilateral varicoceles   . Plantar fasciitis   . ADHD (attention deficit hyperactivity disorder)   . PTSD (post-traumatic stress disorder)     History   Social History  . Marital Status: Married    Spouse Name: N/A    Number of Children: 2  . Years of Education: N/A   Occupational History  . FOOTBALL COACH    Social History Main Topics  . Smoking status: Never Smoker   . Smokeless tobacco: Never Used  . Alcohol Use: No     Comment: occasional wine  . Drug Use: No  . Sexual Activity: Not on file   Other Topics Concern  . Not on file   Social History Narrative   Regular exercise:   No   Caffeine use:  2--32oz cups daily          Past Surgical History  Procedure Laterality Date  . Knee surgery  08/04/08    Right knee-- medial meniscus repair, attenuation anterior cruciate [name] MD [facility])   . Ankle surgery    . Foot surgery    . Shoulder surgery    . Varicocele excision    . Plantar fascia release      Family History  Problem Relation Age of Onset  . Diabetes Mother   . Hypertension Mother   . Arthritis Mother   . Cancer Mother     uterine and breast cancer  . Hyperlipidemia Father   . Arthritis Father   . Cancer Father     prostate cancer  . Hyperlipidemia Maternal Grandmother   . Hypertension Maternal Grandmother   . Hyperlipidemia Maternal Grandfather   . Hypertension Maternal Grandfather   . Hyperlipidemia Paternal Grandmother   . Hypertension Paternal Grandmother   . Hyperlipidemia Paternal Grandfather   . Hypertension Paternal Grandfather   . Heart attack Paternal Grandfather   . Sudden death Neg Hx   . Colon cancer Neg Hx   . Esophageal cancer Neg Hx   .  Stomach cancer Neg Hx   . Rectal cancer Neg Hx     Allergies  Allergen Reactions  . Oxycodone-Acetaminophen Hives and Itching    Current Outpatient Prescriptions on File Prior to Visit  Medication Sig Dispense Refill  . aspirin 81 MG tablet Take 1 tablet (81 mg total) by mouth daily.  100 tablet  3  . Blood Glucose Monitoring Suppl (ONE TOUCH ULTRA 2) W/DEVICE KIT Use to check blood sugar once a day.  Dx 250.00  1 each  0  . Blood Pressure Monitoring (SPHYGMOMANOMETER) MISC Use as instructed to check blood pressure. Dx 401.9  1 each  0  . cetirizine (ZYRTEC) 10 MG tablet Take 1 tablet (10 mg total) by mouth daily.  30 tablet  11  . escitalopram (LEXAPRO) 10 MG tablet Take 10 mg by mouth daily.      Marland Kitchen glucose blood (ONE TOUCH ULTRA TEST) test strip Use to check blood sugar once daily as instructed. Dx 250.00  50 each  2  . hydrALAZINE (APRESOLINE) 50 MG tablet  Take 1 tablet (50 mg total) by mouth 3 (three) times daily.  90 tablet  2  . Lancets (ONETOUCH ULTRASOFT) lancets Use to check blood sugar once daily as instructed.  Dx 250.00  100 each  1  . methylphenidate (CONCERTA) 27 MG CR tablet Take 27 mg by mouth every morning.      . metoprolol succinate (TOPROL-XL) 100 MG 24 hr tablet Take 1 tablet (100 mg total) by mouth daily. Take with or immediately following a meal.  30 tablet  2  . Multiple Vitamin (MULTIVITAMIN) capsule Take 1 capsule by mouth daily.  30 capsule  11  . niacin (SLO-NIACIN) 500 MG tablet Take 1 tablet (500 mg total) by mouth at bedtime.  30 tablet  5  . omeprazole (PRILOSEC) 20 MG capsule TAKE 2 CAPSULES (40 MG) EVERY MORNING  180 capsule  0  . potassium chloride (K-DUR,KLOR-CON) 10 MEQ tablet TAKE 1 TABLET (10 MEQ TOTAL) BY MOUTH DAILY.  30 tablet  5  . sildenafil (VIAGRA) 100 MG tablet Take 1/2 to 1 tablet daily as needed  15 tablet  0  . sitaGLIPtin (JANUVIA) 100 MG tablet Take 1 tablet (100 mg total) by mouth daily.  90 tablet  1  . TRIBENZOR 40-10-25 MG TABS TAKE 1 TABLET DAILY  90 tablet  0  . VIAGRA 100 MG tablet TAKE ONE-HALF (1/2) TO 1 TABLET (50 MG TO 100 MG) DAILY AS NEEDED  15 tablet  5  . zolpidem (AMBIEN) 10 MG tablet Take 10 mg by mouth at bedtime as needed.      . [DISCONTINUED] potassium chloride (K-DUR) 10 MEQ tablet Take 1 tablet (10 mEq total) by mouth daily.  30 tablet  1   No current facility-administered medications on file prior to visit.    BP 144/94  Pulse 66  Temp(Src) 98 F (36.7 C) (Oral)  Resp 18  Ht $R'6\' 1"'yV$  (1.854 m)  Wt 310 lb 1.3 oz (140.651 kg)  BMI 40.92 kg/m2  SpO2 99%        Objective:   Physical Exam  Constitutional: He is oriented to person, place, and time. He appears well-developed and well-nourished. No distress.  HENT:  Head: Normocephalic and atraumatic.  Cardiovascular: Normal rate and regular rhythm.   No murmur heard. Pulmonary/Chest: Effort normal and breath sounds  normal. No respiratory distress. He has no wheezes. He has no rales. He exhibits no tenderness.  Musculoskeletal: He  exhibits no edema.  Neurological: He is alert and oriented to person, place, and time.  Psychiatric: He has a normal mood and affect. His behavior is normal. Judgment and thought content normal.          Assessment & Plan:

## 2013-07-05 NOTE — Assessment & Plan Note (Signed)
BP at home was most stable on hydralazine 50mg  bid along with metoprolol and tribenzor.  Continue these doses.

## 2013-07-06 ENCOUNTER — Other Ambulatory Visit: Payer: Self-pay | Admitting: Family

## 2013-07-06 MED ORDER — FISH OIL 1000 MG PO CAPS: 2000.0000 mg | ORAL_CAPSULE | Freq: Two times a day (BID) | ORAL | Status: DC

## 2013-07-07 ENCOUNTER — Ambulatory Visit (HOSPITAL_BASED_OUTPATIENT_CLINIC_OR_DEPARTMENT_OTHER)
Admission: RE | Admit: 2013-07-07 | Discharge: 2013-07-07 | Disposition: A | Discharge status: Home / Self Care | Source: Ambulatory Visit | Attending: Family | Admitting: Family

## 2013-07-07 ENCOUNTER — Encounter: Payer: Self-pay | Admitting: Family

## 2013-07-07 DIAGNOSIS — N508 Other specified disorders of male genital organs: Secondary | ICD-10-CM | POA: Insufficient documentation

## 2013-07-07 DIAGNOSIS — N5089 Other specified disorders of the male genital organs: Secondary | ICD-10-CM

## 2013-07-12 ENCOUNTER — Telehealth: Payer: Self-pay

## 2013-07-12 NOTE — Telephone Encounter (Signed)
Relevant patient education assigned to patient using Emmi. ° °

## 2013-07-16 ENCOUNTER — Encounter (HOSPITAL_BASED_OUTPATIENT_CLINIC_OR_DEPARTMENT_OTHER): Payer: Self-pay | Admitting: Emergency Medicine

## 2013-07-16 ENCOUNTER — Emergency Department (HOSPITAL_BASED_OUTPATIENT_CLINIC_OR_DEPARTMENT_OTHER)
Admission: EM | Admit: 2013-07-16 | Discharge: 2013-07-16 | Disposition: A | Discharge status: Home / Self Care | Attending: Emergency Medicine | Admitting: Emergency Medicine

## 2013-07-16 DIAGNOSIS — L500 Allergic urticaria: Secondary | ICD-10-CM | POA: Insufficient documentation

## 2013-07-16 DIAGNOSIS — Z860100 Personal history of colon polyps, unspecified: Secondary | ICD-10-CM | POA: Insufficient documentation

## 2013-07-16 DIAGNOSIS — E119 Type 2 diabetes mellitus without complications: Secondary | ICD-10-CM | POA: Insufficient documentation

## 2013-07-16 DIAGNOSIS — Z79899 Other long term (current) drug therapy: Secondary | ICD-10-CM | POA: Insufficient documentation

## 2013-07-16 DIAGNOSIS — T7840XA Allergy, unspecified, initial encounter: Secondary | ICD-10-CM

## 2013-07-16 DIAGNOSIS — M129 Arthropathy, unspecified: Secondary | ICD-10-CM | POA: Insufficient documentation

## 2013-07-16 DIAGNOSIS — E785 Hyperlipidemia, unspecified: Secondary | ICD-10-CM | POA: Insufficient documentation

## 2013-07-16 DIAGNOSIS — Z8669 Personal history of other diseases of the nervous system and sense organs: Secondary | ICD-10-CM | POA: Insufficient documentation

## 2013-07-16 DIAGNOSIS — F909 Attention-deficit hyperactivity disorder, unspecified type: Secondary | ICD-10-CM | POA: Insufficient documentation

## 2013-07-16 DIAGNOSIS — Z8739 Personal history of other diseases of the musculoskeletal system and connective tissue: Secondary | ICD-10-CM | POA: Insufficient documentation

## 2013-07-16 DIAGNOSIS — K219 Gastro-esophageal reflux disease without esophagitis: Secondary | ICD-10-CM | POA: Insufficient documentation

## 2013-07-16 DIAGNOSIS — Z7982 Long term (current) use of aspirin: Secondary | ICD-10-CM | POA: Insufficient documentation

## 2013-07-16 DIAGNOSIS — I10 Essential (primary) hypertension: Secondary | ICD-10-CM | POA: Insufficient documentation

## 2013-07-16 MED ORDER — HYDROCORTISONE 2.5 % EX LOTN: TOPICAL_LOTION | Freq: Two times a day (BID) | CUTANEOUS | Status: DC

## 2013-07-16 MED ORDER — DIPHENHYDRAMINE HCL 25 MG PO CAPS: 25.0000 mg | ORAL_CAPSULE | Freq: Four times a day (QID) | ORAL | Status: DC | PRN

## 2013-07-16 MED ORDER — DIPHENHYDRAMINE HCL 50 MG/ML IJ SOLN
50.0000 mg | Freq: Once | INTRAMUSCULAR | Status: AC
  Administered 2013-07-16: 50 mg via INTRAMUSCULAR
  Filled 2013-07-16: qty 1

## 2013-07-16 NOTE — ED Notes (Signed)
Several small bumps on rt arm,  Itching x 2 days,  Also states back itches but no bumps

## 2013-07-16 NOTE — ED Provider Notes (Signed)
CSN: [id number]     Arrival date & time 07/16/13  0017 History   First Nash Initiated Contact with Patient 07/16/13 0236     Chief Complaint  Patient presents with  . Urticaria     (Consider location/radiation/quality/duration/timing/severity/associated sxs/prior Treatment) HPI Comments: Pt with hx of DM, allergic reaction with unknown etiology comes in with cc of bumps to the right arm since Friday. Pt had 2 bumps initially, but now has more in the same area. The lesions are itchy and painful when applied pressure to. No drainage, bleeding. No new exposures. Has seen allergist before.  Patient is a 48 y.o. male presenting with urticaria. The history is provided by the patient.  Urticaria    Past Medical History  Diagnosis Date  . Hyperlipidemia   . Diabetes mellitus   . Hypertension   . OSA (obstructive sleep apnea)   . Arthritis   . Allergy   . Personal history of colonic polyps   . GERD (gastroesophageal reflux disease)   . Fatty liver 08/06/2011  . Bilateral varicoceles   . Plantar fasciitis   . ADHD (attention deficit hyperactivity disorder)   . PTSD (post-traumatic stress disorder)    Past Surgical History  Procedure Laterality Date  . Knee surgery  08/04/08    Right knee-- medial meniscus repair, attenuation anterior cruciate Charles Nash [facility])   . Ankle surgery    . Foot surgery    . Shoulder surgery    . Varicocele excision    . Plantar fascia release     Family History  Problem Relation Age of Onset  . Diabetes Mother   . Hypertension Mother   . Arthritis Mother   . Cancer Mother     uterine and breast cancer  . Hyperlipidemia Father   . Arthritis Father   . Cancer Father     prostate cancer  . Hyperlipidemia Maternal Grandmother   . Hypertension Maternal Grandmother   . Hyperlipidemia Maternal Grandfather   . Hypertension Maternal Grandfather   . Hyperlipidemia Paternal Grandmother   . Hypertension Paternal Grandmother   .  Hyperlipidemia Paternal Grandfather   . Hypertension Paternal Grandfather   . Heart attack Paternal Grandfather   . Sudden death Neg Hx   . Colon cancer Neg Hx   . Esophageal cancer Neg Hx   . Stomach cancer Neg Hx   . Rectal cancer Neg Hx    History  Substance Use Topics  . Smoking status: Never Smoker   . Smokeless tobacco: Never Used  . Alcohol Use: No     Comment: occasional wine    Review of Systems  Eyes: Negative for redness and itching.  Musculoskeletal: Negative for arthralgias and myalgias.  Skin: Positive for rash.  Allergic/Immunologic: Negative for immunocompromised state.      Allergies  Oxycodone-acetaminophen  Home Medications   Current Outpatient Rx  Name  Route  Sig  Dispense  Refill  . aspirin 81 MG tablet   Oral   Take 1 tablet (81 mg total) by mouth daily.   100 tablet   3   . Blood Glucose Monitoring Suppl (ONE TOUCH ULTRA 2) W/DEVICE KIT      Use to check blood sugar once a day.  Dx 250.00   1 each   0   . Blood Pressure Monitoring (SPHYGMOMANOMETER) MISC      Use as instructed to check blood pressure. Dx 401.9   1 each   0   . cetirizine (ZYRTEC)  10 MG tablet   Oral   Take 1 tablet (10 mg total) by mouth daily.   30 tablet   11   . diphenhydrAMINE (BENADRYL) 25 mg capsule   Oral   Take 1 capsule (25 mg total) by mouth every 6 (six) hours as needed for itching.   20 capsule   0   . escitalopram (LEXAPRO) 10 MG tablet   Oral   Take 10 mg by mouth daily.         Marland Kitchen glucose blood (ONE TOUCH ULTRA TEST) test strip      Use to check blood sugar once daily as instructed. Dx 250.00   50 each   2   . hydrALAZINE (APRESOLINE) 50 MG tablet   Oral   Take 50 mg by mouth 2 (two) times daily.         . hydrocortisone 2.5 % lotion   Topical   Apply topically 2 (two) times daily.   59 mL   0   . Lancets (ONETOUCH ULTRASOFT) lancets      Use to check blood sugar once daily as instructed.  Dx 250.00   100 each   1   .  methylphenidate (CONCERTA) 27 MG CR tablet   Oral   Take 27 mg by mouth every morning.         . metoprolol succinate (TOPROL-XL) 100 MG 24 hr tablet   Oral   Take 1 tablet (100 mg total) by mouth daily. Take with or immediately following a meal.   30 tablet   2   . Multiple Vitamin (MULTIVITAMIN) capsule   Oral   Take 1 capsule by mouth daily.   30 capsule   11   . niacin (SLO-NIACIN) 500 MG tablet   Oral   Take 1 tablet (500 mg total) by mouth at bedtime.   30 tablet   5   . Omega-3 Fatty Acids (FISH OIL) 1000 MG CAPS   Oral   Take 2 capsules (2,000 mg total) by mouth 2 (two) times daily.      0   . omeprazole (PRILOSEC) 20 MG capsule      TAKE 2 CAPSULES (40 MG) EVERY MORNING   180 capsule   0   . potassium chloride (K-DUR,KLOR-CON) 10 MEQ tablet      TAKE 1 TABLET (10 MEQ TOTAL) BY MOUTH DAILY.   30 tablet   5   . sildenafil (VIAGRA) 100 MG tablet      Take 1/2 to 1 tablet daily as needed   15 tablet   0   . sitaGLIPtin (JANUVIA) 100 MG tablet   Oral   Take 1 tablet (100 mg total) by mouth daily.   90 tablet   1   . TRIBENZOR 40-10-25 MG TABS      TAKE 1 TABLET DAILY   90 tablet   0   . VIAGRA 100 MG tablet      TAKE ONE-HALF (1/2) TO 1 TABLET (50 MG TO 100 MG) DAILY AS NEEDED   15 tablet   5   . zolpidem (AMBIEN) 10 MG tablet   Oral   Take 10 mg by mouth at bedtime as needed.          BP 155/84  Pulse 75  Temp(Src) 98.6 F (37 C) (Oral)  Resp 16  Ht $R'6\' 1"'lP$  (1.854 m)  Wt 302 lb (136.986 kg)  BMI 39.85 kg/m2  SpO2 98% Physical Exam  Nursing note and  vitals reviewed. Constitutional: He appears well-developed.  Eyes: Conjunctivae are normal.  Neck: Neck supple.  Cardiovascular: Normal rate and regular rhythm.   Pulmonary/Chest: Effort normal.  Abdominal: Soft.  Skin: Rash noted.  About 5-6 erythematous papules appreciated over the right forearm. No fluctuance, indurations. No puncture wound in the middle, no drainage.    ED  Course  Procedures (including critical care time) Labs Review Labs Reviewed - No data to display Imaging Review No results found.   EKG Interpretation None      MDM   Final diagnoses:  Allergic reaction    Pt with forearm lesions. Unclear what these are. No insect bite appreciated. Lesions are itch and painful with palpation only. Suspect localized hypersensitivity reaction to something. Pt has a pcp - and we have decided to treat with topical steroids and benadryl. No antibiotics. Pt to see pcp if not getting better.  Varney Biles, Nash 07/16/13 [id number]

## 2013-07-16 NOTE — Discharge Instructions (Signed)
It appears that you have a localized skin infection. Please take the meds prescribed. No signs of infection currently, however, you are diabetic, so at risk of infection. If you notice increased swelling, pain or purulent discharge, call your pcp immediately for an appointment, otherwise ,see them in 1 week.   Rash A rash is a change in the color or texture of your skin. There are many different types of rashes. You may have other problems that accompany your rash. CAUSES   Infections.  Allergic reactions. This can include allergies to pets or foods.  Certain medicines.  Exposure to certain chemicals, soaps, or cosmetics.  Heat.  Exposure to poisonous plants.  Tumors, both cancerous and noncancerous. SYMPTOMS   Redness.  Scaly skin.  Itchy skin.  Dry or cracked skin.  Bumps.  Blisters.  Pain. DIAGNOSIS  Your caregiver may do a physical exam to determine what type of rash you have. A skin sample (biopsy) may be taken and examined under a microscope. TREATMENT  Treatment depends on the type of rash you have. Your caregiver may prescribe certain medicines. For serious conditions, you may need to see a skin doctor (dermatologist). HOME CARE INSTRUCTIONS   Avoid the substance that caused your rash.  Do not scratch your rash. This can cause infection.  You may take cool baths to help stop itching.  Only take over-the-counter or prescription medicines as directed by your caregiver.  Keep all follow-up appointments as directed by your caregiver. SEEK IMMEDIATE MEDICAL CARE IF:  You have increasing pain, swelling, or redness.  You have a fever.  You have new or severe symptoms.  You have body aches, diarrhea, or vomiting.  Your rash is not better after 3 days. MAKE SURE YOU:  Understand these instructions.  Will watch your condition.  Will get help right away if you are not doing well or get worse. Document Released: 03/21/2002 Document Revised: 06/23/2011  Document Reviewed: 01/13/2011 [facility] Patient Information 2014 [place], [place].  Allergies Allergies may happen from anything your body is sensitive to. This may be food, medicines, pollens, chemicals, and nearly anything around you in everyday life that produces allergens. An allergen is anything that causes an allergy producing substance. Heredity is often a factor in causing these problems. This means you may have some of the same allergies as your parents. Food allergies happen in all age groups. Food allergies are some of the most severe and life threatening. Some common food allergies are cow's milk, seafood, eggs, nuts, wheat, and soybeans. SYMPTOMS   Swelling around the mouth.  An itchy red rash or hives.  Vomiting or diarrhea.  Difficulty breathing. SEVERE ALLERGIC REACTIONS ARE LIFE-THREATENING. This reaction is called anaphylaxis. It can cause the mouth and throat to swell and cause difficulty with breathing and swallowing. In severe reactions only a trace amount of food (for example, peanut oil in a salad) may cause death within seconds. Seasonal allergies occur in all age groups. These are seasonal because they usually occur during the same season every year. They may be a reaction to molds, grass pollens, or tree pollens. Other causes of problems are house dust mite allergens, pet dander, and mold spores. The symptoms often consist of nasal congestion, a runny itchy nose associated with sneezing, and tearing itchy eyes. There is often an associated itching of the mouth and ears. The problems happen when you come in contact with pollens and other allergens. Allergens are the particles in the air that the body reacts to  with an allergic reaction. This causes you to release allergic antibodies. Through a chain of events, these eventually cause you to release histamine into the blood stream. Although it is meant to be protective to the body, it is this release that causes your discomfort.  This is why you were given anti-histamines to feel better. If you are unable to pinpoint the offending allergen, it may be determined by skin or blood testing. Allergies cannot be cured but can be controlled with medicine. Hay fever is a collection of all or some of the seasonal allergy problems. It may often be treated with simple over-the-counter medicine such as diphenhydramine. Take medicine as directed. Do not drink alcohol or drive while taking this medicine. Check with your caregiver or package insert for child dosages. If these medicines are not effective, there are many new medicines your caregiver can prescribe. Stronger medicine such as nasal spray, eye drops, and corticosteroids may be used if the first things you try do not work well. Other treatments such as immunotherapy or desensitizing injections can be used if all else fails. Follow up with your caregiver if problems continue. These seasonal allergies are usually not life threatening. They are generally more of a nuisance that can often be handled using medicine. HOME CARE INSTRUCTIONS   If unsure what causes a reaction, keep a diary of foods eaten and symptoms that follow. Avoid foods that cause reactions.  If hives or rash are present:  Take medicine as directed.  You may use an over-the-counter antihistamine (diphenhydramine) for hives and itching as needed.  Apply cold compresses (cloths) to the skin or take baths in cool water. Avoid hot baths or showers. Heat will make a rash and itching worse.  If you are severely allergic:  Following a treatment for a severe reaction, hospitalization is often required for closer follow-up.  Wear a medic-alert bracelet or necklace stating the allergy.  You and your family must learn how to give adrenaline or use an anaphylaxis kit.  If you have had a severe reaction, always carry your anaphylaxis kit or EpiPen with you. Use this medicine as directed by your caregiver if a severe  reaction is occurring. Failure to do so could have a fatal outcome. SEEK MEDICAL CARE IF:  You suspect a food allergy. Symptoms generally happen within 30 minutes of eating a food.  Your symptoms have not gone away within 2 days or are getting worse.  You develop new symptoms.  You want to retest yourself or your child with a food or drink you think causes an allergic reaction. Never do this if an anaphylactic reaction to that food or drink has happened before. Only do this under the care of a caregiver. SEEK IMMEDIATE MEDICAL CARE IF:   You have difficulty breathing, are wheezing, or have a tight feeling in your chest or throat.  You have a swollen mouth, or you have hives, swelling, or itching all over your body.  You have had a severe reaction that has responded to your anaphylaxis kit or an EpiPen. These reactions may return when the medicine has worn off. These reactions should be considered life threatening. MAKE SURE YOU:   Understand these instructions.  Will watch your condition.  Will get help right away if you are not doing well or get worse. Document Released: 06/24/2002 Document Revised: 07/26/2012 Document Reviewed: 11/29/2007 [facility] Patient Information 2014 [place].

## 2013-07-16 NOTE — ED Notes (Signed)
Multiple hives on right arm and hand that started 1 week ago with itching.  No new products. Has seen allergist for similar episode in the past but the med he got then is not working now. Pt sts his Ritalin dose was increased last week.  No other change pt knows of.

## 2013-08-03 ENCOUNTER — Other Ambulatory Visit: Payer: Self-pay | Admitting: Family

## 2013-08-05 ENCOUNTER — Telehealth: Payer: Self-pay | Admitting: Family

## 2013-08-05 MED ORDER — METOPROLOL SUCCINATE ER 100 MG PO TB24: 100.0000 mg | ORAL_TABLET | Freq: Every day | ORAL | Status: DC

## 2013-08-05 MED ORDER — HYDRALAZINE HCL 50 MG PO TABS: 50.0000 mg | ORAL_TABLET | Freq: Two times a day (BID) | ORAL | Status: DC

## 2013-08-05 NOTE — Telephone Encounter (Signed)
Requesting refill on hydrALAZINE (APRESOLINE) 50 MG tablet and metoprolol 100 MG

## 2013-08-05 NOTE — Telephone Encounter (Signed)
Rx request to pharmacy/[facility]  

## 2013-08-08 ENCOUNTER — Other Ambulatory Visit: Payer: Self-pay | Admitting: Family

## 2013-08-08 ENCOUNTER — Telehealth: Payer: Self-pay | Admitting: Family

## 2013-08-08 NOTE — Telephone Encounter (Signed)
Currently out of samples for both of these medications. Notified front office and they will tell pt.

## 2013-08-08 NOTE — Telephone Encounter (Signed)
Patient is requesting a sample of nasonex or flonase. He would like to pick up sample when his son finishes with his appointment

## 2013-08-08 NOTE — Telephone Encounter (Signed)
**  SHORT TERM SUPPLY WHILE AWAITING MAIL ORDER** #15X0 to [facility]

## 2013-08-09 ENCOUNTER — Telehealth: Payer: Self-pay | Admitting: Family

## 2013-08-09 ENCOUNTER — Ambulatory Visit: Admitting: Family Medicine

## 2013-08-12 NOTE — Telephone Encounter (Signed)
Could you please contact pt's urologist and request most recent labs/office note?

## 2013-08-12 NOTE — Telephone Encounter (Signed)
Opened in error

## 2013-08-15 ENCOUNTER — Telehealth: Payer: Self-pay | Admitting: Family

## 2013-08-15 NOTE — Telephone Encounter (Signed)
Notes/labs received and forwarded to Provider for review.

## 2013-08-15 NOTE — Telephone Encounter (Signed)
Spoke with medical records at [phone] and they will fax most recent notes/labs.

## 2013-08-15 NOTE — Telephone Encounter (Signed)
Please contact Dr. Remi Deter office and request most recent urology notes/labs.

## 2013-08-17 ENCOUNTER — Telehealth: Payer: Self-pay | Admitting: Family

## 2013-08-17 ENCOUNTER — Encounter: Payer: Self-pay | Admitting: Family

## 2013-08-17 NOTE — Telephone Encounter (Signed)
Message copied by Debbrah Alar on Wed Aug 17, 2013  7:13 AM ------      Message from: Penni Homans A      Created: Tue Aug 16, 2013  5:56 PM       He can proceed with the testosterone I thinkg but would check lfts again in 3 months      ----- Message -----         From: Debbrah Alar, NP         Sent: 08/16/2013   3:06 PM           To: Mosie Lukes, MD            This pt has low testosterone, ED and hx of fatty liver disease.  LFT's have recently normalized. Urology is requesting clearance to resume his testosterone therapy.  Do you see any contraindication at this point? Thanks!       ------

## 2013-08-17 NOTE — Telephone Encounter (Signed)
Please fax letter to Dr. Janice Norrie re: resuming Testosterone therapy and let pt know that he is cleared from our stand point to resume testosterone as long as LFT's remain stable.

## 2013-08-17 NOTE — Telephone Encounter (Signed)
Letter faxed to Dr Janice Norrie at [phone].

## 2013-08-21 ENCOUNTER — Other Ambulatory Visit: Payer: Self-pay | Admitting: Family

## 2013-09-01 ENCOUNTER — Other Ambulatory Visit: Payer: Self-pay | Admitting: Otolaryngology

## 2013-09-01 DIAGNOSIS — E041 Nontoxic single thyroid nodule: Secondary | ICD-10-CM

## 2013-09-01 DIAGNOSIS — E040 Nontoxic diffuse goiter: Secondary | ICD-10-CM

## 2013-09-01 DIAGNOSIS — D497 Neoplasm of unspecified behavior of endocrine glands and other parts of nervous system: Secondary | ICD-10-CM

## 2013-09-02 ENCOUNTER — Ambulatory Visit
Admission: RE | Admit: 2013-09-02 | Discharge: 2013-09-02 | Disposition: A | Discharge status: Home / Self Care | Source: Ambulatory Visit | Attending: Otolaryngology | Admitting: Otolaryngology

## 2013-09-02 DIAGNOSIS — E040 Nontoxic diffuse goiter: Secondary | ICD-10-CM

## 2013-09-02 DIAGNOSIS — E041 Nontoxic single thyroid nodule: Secondary | ICD-10-CM

## 2013-09-02 DIAGNOSIS — D497 Neoplasm of unspecified behavior of endocrine glands and other parts of nervous system: Secondary | ICD-10-CM

## 2013-09-07 ENCOUNTER — Other Ambulatory Visit: Payer: Self-pay | Admitting: Otolaryngology

## 2013-09-07 DIAGNOSIS — E042 Nontoxic multinodular goiter: Secondary | ICD-10-CM

## 2013-09-08 ENCOUNTER — Other Ambulatory Visit: Payer: Self-pay | Admitting: Otolaryngology

## 2013-09-08 DIAGNOSIS — E042 Nontoxic multinodular goiter: Secondary | ICD-10-CM

## 2013-09-13 ENCOUNTER — Ambulatory Visit
Admission: RE | Admit: 2013-09-13 | Discharge: 2013-09-13 | Disposition: A | Discharge status: Home / Self Care | Source: Ambulatory Visit | Attending: Otolaryngology | Admitting: Otolaryngology

## 2013-09-13 ENCOUNTER — Other Ambulatory Visit (HOSPITAL_COMMUNITY)
Admission: RE | Admit: 2013-09-13 | Discharge: 2013-09-13 | Disposition: A | Discharge status: Home / Self Care | Source: Ambulatory Visit | Attending: Interventional Radiology | Admitting: Interventional Radiology

## 2013-09-13 DIAGNOSIS — E042 Nontoxic multinodular goiter: Secondary | ICD-10-CM

## 2013-09-19 ENCOUNTER — Encounter: Payer: Self-pay | Admitting: Family

## 2013-09-21 ENCOUNTER — Encounter: Payer: Self-pay | Admitting: Family

## 2013-10-10 ENCOUNTER — Other Ambulatory Visit: Payer: Self-pay | Admitting: Family

## 2013-10-10 NOTE — Telephone Encounter (Signed)
90 day supply of januvia sent to mail order. Pt was due for follow up this month. Please call pt to arrange follow up before further refills are due.

## 2013-10-11 NOTE — Telephone Encounter (Signed)
Informed patient of medication refill and he scheduled appointment for 11/07/13

## 2013-10-15 ENCOUNTER — Other Ambulatory Visit: Payer: Self-pay | Admitting: Family

## 2013-10-16 ENCOUNTER — Emergency Department (HOSPITAL_BASED_OUTPATIENT_CLINIC_OR_DEPARTMENT_OTHER)
Admission: EM | Admit: 2013-10-16 | Discharge: 2013-10-16 | Disposition: A | Discharge status: Home / Self Care | Attending: Emergency Medicine | Admitting: Emergency Medicine

## 2013-10-16 ENCOUNTER — Encounter (HOSPITAL_BASED_OUTPATIENT_CLINIC_OR_DEPARTMENT_OTHER): Payer: Self-pay | Admitting: Emergency Medicine

## 2013-10-16 DIAGNOSIS — I10 Essential (primary) hypertension: Secondary | ICD-10-CM | POA: Insufficient documentation

## 2013-10-16 DIAGNOSIS — IMO0002 Reserved for concepts with insufficient information to code with codable children: Secondary | ICD-10-CM | POA: Insufficient documentation

## 2013-10-16 DIAGNOSIS — Z4802 Encounter for removal of sutures: Secondary | ICD-10-CM | POA: Insufficient documentation

## 2013-10-16 DIAGNOSIS — F909 Attention-deficit hyperactivity disorder, unspecified type: Secondary | ICD-10-CM | POA: Insufficient documentation

## 2013-10-16 DIAGNOSIS — Z860100 Personal history of colon polyps, unspecified: Secondary | ICD-10-CM | POA: Insufficient documentation

## 2013-10-16 DIAGNOSIS — E119 Type 2 diabetes mellitus without complications: Secondary | ICD-10-CM | POA: Insufficient documentation

## 2013-10-16 DIAGNOSIS — Z8669 Personal history of other diseases of the nervous system and sense organs: Secondary | ICD-10-CM | POA: Insufficient documentation

## 2013-10-16 DIAGNOSIS — E785 Hyperlipidemia, unspecified: Secondary | ICD-10-CM | POA: Insufficient documentation

## 2013-10-16 DIAGNOSIS — Z7982 Long term (current) use of aspirin: Secondary | ICD-10-CM | POA: Insufficient documentation

## 2013-10-16 DIAGNOSIS — K219 Gastro-esophageal reflux disease without esophagitis: Secondary | ICD-10-CM | POA: Insufficient documentation

## 2013-10-16 DIAGNOSIS — M129 Arthropathy, unspecified: Secondary | ICD-10-CM | POA: Insufficient documentation

## 2013-10-16 DIAGNOSIS — Z79899 Other long term (current) drug therapy: Secondary | ICD-10-CM | POA: Insufficient documentation

## 2013-10-16 NOTE — Discharge Instructions (Signed)

## 2013-10-16 NOTE — ED Notes (Signed)
Pt has stitches to right eye, were placed 10 days ago at [place] in [place]. Pt now has stitches coming out of right eye.

## 2013-10-16 NOTE — ED Provider Notes (Signed)
CSN: [id number]     Arrival date & time 10/16/13  1900 History   First MD Initiated Contact with Patient 10/16/13 2130     Chief Complaint  Patient presents with  . Suture / Staple Removal     (Consider location/radiation/quality/duration/timing/severity/associated sxs/prior Treatment) Patient is a 48 y.o. male presenting with suture removal. The history is provided by the patient. No language interpreter was used.  Suture / Staple Removal This is a new problem. The current episode started 1 to 4 weeks ago. The problem occurs constantly. The problem has been unchanged. Nothing aggravates the symptoms. He has tried nothing for the symptoms.  [facility].   Pt here for suture removal.  Past Medical History  Diagnosis Date  . Hyperlipidemia   . Diabetes mellitus   . Hypertension   . OSA (obstructive sleep apnea)   . Arthritis   . Allergy   . Personal history of colonic polyps   . GERD (gastroesophageal reflux disease)   . Fatty liver 08/06/2011  . Bilateral varicoceles   . Plantar fasciitis   . ADHD (attention deficit hyperactivity disorder)   . PTSD (post-traumatic stress disorder)    Past Surgical History  Procedure Laterality Date  . Knee surgery  08/04/08    Right knee-- medial meniscus repair, attenuation anterior cruciate [name] MD [facility])   . Ankle surgery    . Foot surgery    . Shoulder surgery    . Varicocele excision    . Plantar fascia release     Family History  Problem Relation Age of Onset  . Diabetes Mother   . Hypertension Mother   . Arthritis Mother   . Cancer Mother     uterine and breast cancer  . Hyperlipidemia Father   . Arthritis Father   . Cancer Father     prostate cancer  . Hyperlipidemia Maternal Grandmother   . Hypertension Maternal Grandmother   . Hyperlipidemia Maternal Grandfather   . Hypertension Maternal Grandfather   . Hyperlipidemia Paternal Grandmother   . Hypertension  Paternal Grandmother   . Hyperlipidemia Paternal Grandfather   . Hypertension Paternal Grandfather   . Heart attack Paternal Grandfather   . Sudden death Neg Hx   . Colon cancer Neg Hx   . Esophageal cancer Neg Hx   . Stomach cancer Neg Hx   . Rectal cancer Neg Hx    History  Substance Use Topics  . Smoking status: Never Smoker   . Smokeless tobacco: Never Used  . Alcohol Use: No     Comment: occasional wine    Review of Systems  Skin: Positive for wound.  All other systems reviewed and are negative.     Allergies  Oxycodone-acetaminophen  Home Medications   Prior to Admission medications   Medication Sig Start Date End Date Taking? Authorizing Provider  aspirin 81 MG tablet Take 1 tablet (81 mg total) by mouth daily. 02/14/13  Yes Debbrah Alar, NP  Blood Glucose Monitoring Suppl (ONE TOUCH ULTRA 2) W/DEVICE KIT Use to check blood sugar once a day.  Dx 250.00 05/13/12  Yes Debbrah Alar, NP  Blood Pressure Monitoring (SPHYGMOMANOMETER) MISC Use as instructed to check blood pressure. Dx 401.9 12/27/12  Yes Debbrah Alar, NP  cetirizine (ZYRTEC) 10 MG tablet Take 1 tablet (10 mg total) by mouth daily. 02/02/12  Yes Debbrah Alar, NP  diphenhydrAMINE (BENADRYL) 25 mg capsule Take 1 capsule (25 mg total) by mouth  every 6 (six) hours as needed for itching. 07/16/13  Yes Ankit Nanavati, MD  escitalopram (LEXAPRO) 10 MG tablet Take 10 mg by mouth daily.   Yes Historical Provider, MD  glucose blood (ONE TOUCH ULTRA TEST) test strip Use to check blood sugar once daily as instructed. Dx 250.00 05/13/12  Yes Debbrah Alar, NP  hydrALAZINE (APRESOLINE) 50 MG tablet TAKE 1 TABLET TWICE A DAY 10/15/13  Yes Debbrah Alar, NP  hydrocortisone 2.5 % lotion Apply topically 2 (two) times daily. 07/16/13  Yes Varney Biles, MD  JANUVIA 100 MG tablet TAKE 1 TABLET DAILY 10/10/13  Yes Debbrah Alar, NP  Lancets (ONETOUCH ULTRASOFT) lancets Use to check blood sugar once  daily as instructed.  Dx 250.00 01/17/13  Yes Debbrah Alar, NP  methylphenidate (CONCERTA) 27 MG CR tablet Take 27 mg by mouth every morning.   Yes Historical Provider, MD  metoprolol succinate (TOPROL-XL) 100 MG 24 hr tablet TAKE 1 TABLET (100 MG TOTAL) BY MOUTH DAILY. TAKE WITH OR IMMEDIATELY FOLLOWING A MEAL. 08/08/13  Yes Debbrah Alar, NP  metoprolol succinate (TOPROL-XL) 100 MG 24 hr tablet TAKE 1 TABLET DAILY WITH OR IMMEDIATELY FOLLOWING A MEAL 10/15/13  Yes Debbrah Alar, NP  Multiple Vitamin (MULTIVITAMIN) capsule Take 1 capsule by mouth daily. 03/30/13  Yes Debbrah Alar, NP  omeprazole (PRILOSEC) 20 MG capsule TAKE 2 CAPSULES EVERY MORNING 08/21/13  Yes Debbrah Alar, NP  potassium chloride (K-DUR,KLOR-CON) 10 MEQ tablet TAKE 1 TABLET (10 MEQ TOTAL) BY MOUTH DAILY. 12/04/11  Yes Burnice Logan, MD  TRIBENZOR 40-10-25 MG TABS TAKE 1 TABLET DAILY 08/03/13  Yes Debbrah Alar, NP  VIAGRA 100 MG tablet TAKE ONE-HALF (1/2) TO 1 TABLET (50 MG TO 100 MG) DAILY AS NEEDED 04/24/13  Yes Debbrah Alar, NP  niacin (SLO-NIACIN) 500 MG tablet Take 1 tablet (500 mg total) by mouth at bedtime. 11/17/12   Debbrah Alar, NP  Omega-3 Fatty Acids (FISH OIL) 1000 MG CAPS Take 2 capsules (2,000 mg total) by mouth 2 (two) times daily. 07/06/13   Debbrah Alar, NP  sildenafil (VIAGRA) 100 MG tablet Take 1/2 to 1 tablet daily as needed 03/30/13   Debbrah Alar, NP  zolpidem (AMBIEN) 10 MG tablet Take 10 mg by mouth at bedtime as needed. 02/05/13   Ria Bush, MD   BP 144/78  Pulse 79  Temp(Src) 98.1 F (36.7 C) (Oral)  Resp 18  Ht _0  (1.854 m)  Wt 300 lb (136.079 kg)  BMI 39.59 kg/m2  SpO2 95% Physical Exam  Nursing note and vitals reviewed. Constitutional: He is oriented to person, place, and time. He appears well-developed and well-nourished.  HENT:  Head: Normocephalic and atraumatic.  Suture right upper and right lower eyelid  Musculoskeletal:  Normal range of motion.  Neurological: He is alert and oriented to person, place, and time.  Skin: Skin is warm.  Psychiatric: He has a normal mood and affect.    ED Course  Procedures (including critical care time) Labs Review Labs Reviewed - No data to display  Imaging Review No results found.   EKG Interpretation None      MDM   Final diagnoses:  Visit for suture removal    Sutures removed by me.  Incisions healing well    Fransico Meadow, PA-C 10/16/13 2148

## 2013-10-17 NOTE — ED Provider Notes (Signed)
Medical screening examination/treatment/procedure(s) were performed by non-physician practitioner and as supervising physician I was immediately available for consultation/collaboration.  Neta Ehlers, MD 10/17/13 2033

## 2013-11-04 NOTE — Addendum Note (Signed)
Addended by: Debbrah Alar on: 11/04/2013 08:36 AM   Modules accepted: Orders

## 2013-11-07 ENCOUNTER — Ambulatory Visit: Admitting: Family

## 2013-11-07 ENCOUNTER — Telehealth: Payer: Self-pay | Admitting: Family

## 2013-11-07 DIAGNOSIS — Z0289 Encounter for other administrative examinations: Secondary | ICD-10-CM

## 2013-11-07 NOTE — Telephone Encounter (Signed)
NOS appt today due to having surgery, is schedule to come in tomorrow

## 2013-11-07 NOTE — Progress Notes (Signed)
   Subjective:    Patient ID: Charles Nash, male    DOB: 08-20-1965, 48 y.o.   MRN: [id number]  HPI Note opened in error. Pt was a no show. Review of Systems     Objective:   Physical Exam        Assessment & Plan:

## 2013-11-08 ENCOUNTER — Ambulatory Visit: Admitting: Family

## 2013-11-08 ENCOUNTER — Encounter: Payer: Self-pay | Admitting: Family

## 2013-11-08 ENCOUNTER — Ambulatory Visit (INDEPENDENT_AMBULATORY_CARE_PROVIDER_SITE_OTHER): Admitting: Family

## 2013-11-08 VITALS — BP 152/94 | HR 76 | Ht 73.0 in | Wt 328.0 lb

## 2013-11-08 DIAGNOSIS — I10 Essential (primary) hypertension: Secondary | ICD-10-CM

## 2013-11-08 DIAGNOSIS — E119 Type 2 diabetes mellitus without complications: Secondary | ICD-10-CM

## 2013-11-08 DIAGNOSIS — E785 Hyperlipidemia, unspecified: Secondary | ICD-10-CM

## 2013-11-08 DIAGNOSIS — R635 Abnormal weight gain: Secondary | ICD-10-CM | POA: Insufficient documentation

## 2013-11-08 LAB — LIPID PANEL
Cholesterol: 166 mg/dL (ref 0–200)
HDL: 46 mg/dL (ref >39)
LDL CALC: 93 mg/dL (ref 0–99)
TRIGLYCERIDES: 135 mg/dL (ref <150)
Total CHOL/HDL Ratio: 3.6 Ratio
VLDL: 27 mg/dL (ref 0–40)

## 2013-11-08 LAB — COMPREHENSIVE METABOLIC PANEL
ALK PHOS: 66 U/L (ref 39–117)
ALT: 32 U/L (ref 0–53)
AST: 31 U/L (ref 0–37)
Albumin: 4.5 g/dL (ref 3.5–5.2)
BILIRUBIN TOTAL: 1.2 mg/dL (ref 0.2–1.2)
BUN: 8 mg/dL (ref 6–23)
CO2: 33 mEq/L — ABNORMAL HIGH (ref 19–32)
Calcium: 9.9 mg/dL (ref 8.4–10.5)
Chloride: 98 mEq/L (ref 96–112)
Creat: 0.95 mg/dL (ref 0.50–1.35)
GLUCOSE: 126 mg/dL — AB (ref 70–99)
Potassium: 3.8 mEq/L (ref 3.5–5.3)
SODIUM: 138 meq/L (ref 135–145)
TOTAL PROTEIN: 7.8 g/dL (ref 6.0–8.3)

## 2013-11-08 LAB — HEMOGLOBIN A1C
Hgb A1c MFr Bld: 6.1 % — ABNORMAL HIGH (ref <5.7)
Mean Plasma Glucose: 128 mg/dL — ABNORMAL HIGH (ref <117)

## 2013-11-08 MED ORDER — DIPHENHYDRAMINE HCL 25 MG PO CAPS: 25.0000 mg | ORAL_CAPSULE | Freq: Four times a day (QID) | ORAL | Status: DC | PRN

## 2013-11-08 MED ORDER — FUROSEMIDE 20 MG PO TABS: 20.0000 mg | ORAL_TABLET | Freq: Every day | ORAL | Status: DC

## 2013-11-08 MED ORDER — ESZOPICLONE 3 MG PO TABS: 3.0000 mg | ORAL_TABLET | Freq: Every day | ORAL | Status: DC

## 2013-11-08 NOTE — Patient Instructions (Signed)
Patient to follow up with Jeri LagerConley Canal NP in 1 week Start Lasix 20 mg 1 po every day. Follow up with Urology for evaluation regarding weight gain Get labs performed.

## 2013-11-08 NOTE — Assessment & Plan Note (Signed)
Patient reports home BG 120's .  Continue to home monitor BG bring in record to next visit.  Check HbA1c.

## 2013-11-08 NOTE — Assessment & Plan Note (Signed)
Home BP 120's/70-163/100. Continue current medications. Add Lasix 20 mg 1 po qd.  Check labs. Continue to check BP at home.

## 2013-11-08 NOTE — Progress Notes (Signed)
Pre visit review using our clinic review tool, if applicable. No additional management support is needed unless otherwise documented below in the visit note. 

## 2013-11-08 NOTE — Progress Notes (Signed)
Wt Readings from Last 3 Encounters:  11/08/13 328 lb (148.78 kg)  10/16/13 300 lb (136.079 kg)  07/16/13 302 lb (136.986 kg)

## 2013-11-08 NOTE — Assessment & Plan Note (Signed)
Patient reports weight gain since taking testosterone injections.  Advised patient to follow up with Urologist prescribing this medication for re evaluation. Maintain lowfat/carbohydrate diet.

## 2013-11-08 NOTE — Progress Notes (Signed)
Subjective:    Patient ID: Charles Nash, male    DOB: 04-20-1965, 48 y.o.   MRN: [id number]  HPIPatient is seen for follow up for hypertension, Home BP checks 128/7- to 163-100.  Diabetes  Home checks 120's generally.   Weight gain. Patient states he was down to 300lbs, and now 328lbs.today  Patient is seen at the [facility] and was given Lunesta 33m 1 po q hs for sleep.  Pt also prescribed Hydroxyzine which he states he does not want to take due to possible side effects.   Patient requesting refill on Lunesta and, Benadryl.    Review of Systems  Constitutional: Positive for unexpected weight change. Negative for fatigue.  HENT: Negative.   Respiratory: Negative.  Negative for cough, choking, chest tightness, shortness of breath and wheezing.   Cardiovascular: Negative.  Negative for chest pain, palpitations and leg swelling.  Neurological: Negative.   Psychiatric/Behavioral: Negative.    Past Medical History  Diagnosis Date  . Hyperlipidemia   . Diabetes mellitus   . Hypertension   . OSA (obstructive sleep apnea)   . Arthritis   . Allergy   . Personal history of colonic polyps   . GERD (gastroesophageal reflux disease)   . Fatty liver 08/06/2011  . Bilateral varicoceles   . Plantar fasciitis   . ADHD (attention deficit hyperactivity disorder)   . PTSD (post-traumatic stress disorder)     History   Social History  . Marital Status: Married    Spouse Name: N/A    Number of Children: 2  . Years of Education: N/A   Occupational History  . FOOTBALL COACH    Social History Main Topics  . Smoking status: Never Smoker   . Smokeless tobacco: Never Used  . Alcohol Use: No     Comment: occasional wine  . Drug Use: No  . Sexual Activity: Not on file   Other Topics Concern  . Not on file   Social History Narrative   Regular exercise:  No   Caffeine use:  2--32oz cups daily          Past Surgical History  Procedure Laterality Date  . Knee surgery  08/04/08   Right knee-- medial meniscus repair, attenuation anterior cruciate l[facility]anine [facility]   . Ankle surgery    . Foot surgery    . Shoulder surgery    . Varicocele excision    . Plantar fascia release      Family History  Problem Relation Age of Onset  . Diabetes Mother   . Hypertension Mother   . Arthritis Mother   . Cancer Mother     uterine and breast cancer  . Hyperlipidemia Father   . Arthritis Father   . Cancer Father     prostate cancer  . Hyperlipidemia Maternal Grandmother   . Hypertension Maternal Grandmother   . Hyperlipidemia Maternal Grandfather   . Hypertension Maternal Grandfather   . Hyperlipidemia Paternal Grandmother   . Hypertension Paternal Grandmother   . Hyperlipidemia Paternal Grandfather   . Hypertension Paternal Grandfather   . Heart attack Paternal Grandfather   . Sudden death Neg Hx   . Colon cancer Neg Hx   . Esophageal cancer Neg Hx   . Stomach cancer Neg Hx   . Rectal cancer Neg Hx     Allergies  Allergen Reactions  . Oxycodone-Acetaminophen Hives and Itching    Current Outpatient Prescriptions on File Prior to Visit  Medication Sig Dispense Refill  .  aspirin 81 MG tablet Take 1 tablet (81 mg total) by mouth daily.  100 tablet  3  . Blood Glucose Monitoring Suppl (ONE TOUCH ULTRA 2) W/DEVICE KIT Use to check blood sugar once a day.  Dx 250.00  1 each  0  . Blood Pressure Monitoring (SPHYGMOMANOMETER) MISC Use as instructed to check blood pressure. Dx 401.9  1 each  0  . cetirizine (ZYRTEC) 10 MG tablet Take 1 tablet (10 mg total) by mouth daily.  30 tablet  11  . diphenhydrAMINE (BENADRYL) 25 mg capsule Take 1 capsule (25 mg total) by mouth every 6 (six) hours as needed for itching.  20 capsule  0  . escitalopram (LEXAPRO) 10 MG tablet Take 10 mg by mouth daily.      Marland Kitchen glucose blood (ONE TOUCH ULTRA TEST) test strip Use to check blood sugar once daily as instructed. Dx 250.00  50 each  2  . hydrALAZINE (APRESOLINE) 50 MG  tablet TAKE 1 TABLET TWICE A DAY  180 tablet  0  . hydrocortisone 2.5 % lotion Apply topically 2 (two) times daily.  59 mL  0  . JANUVIA 100 MG tablet TAKE 1 TABLET DAILY  90 tablet  0  . Lancets (ONETOUCH ULTRASOFT) lancets Use to check blood sugar once daily as instructed.  Dx 250.00  100 each  1  . methylphenidate (CONCERTA) 27 MG CR tablet Take 27 mg by mouth every morning.      . metoprolol succinate (TOPROL-XL) 100 MG 24 hr tablet TAKE 1 TABLET DAILY WITH OR IMMEDIATELY FOLLOWING A MEAL  90 tablet  0  . Multiple Vitamin (MULTIVITAMIN) capsule Take 1 capsule by mouth daily.  30 capsule  11  . niacin (SLO-NIACIN) 500 MG tablet Take 1 tablet (500 mg total) by mouth at bedtime.  30 tablet  5  . omeprazole (PRILOSEC) 20 MG capsule TAKE 2 CAPSULES EVERY MORNING  180 capsule  0  . potassium chloride (K-DUR,KLOR-CON) 10 MEQ tablet TAKE 1 TABLET (10 MEQ TOTAL) BY MOUTH DAILY.  30 tablet  5  . sildenafil (VIAGRA) 100 MG tablet Take 1/2 to 1 tablet daily as needed  15 tablet  0  . TRIBENZOR 40-10-25 MG TABS TAKE 1 TABLET DAILY  90 tablet  1  . VIAGRA 100 MG tablet TAKE ONE-HALF (1/2) TO 1 TABLET (50 MG TO 100 MG) DAILY AS NEEDED  15 tablet  5  . zolpidem (AMBIEN) 10 MG tablet Take 10 mg by mouth at bedtime as needed.      . Omega-3 Fatty Acids (FISH OIL) 1000 MG CAPS Take 2 capsules (2,000 mg total) by mouth 2 (two) times daily.    0  . [DISCONTINUED] potassium chloride (K-DUR) 10 MEQ tablet Take 1 tablet (10 mEq total) by mouth daily.  30 tablet  1   No current facility-administered medications on file prior to visit.    Ht _0  (1.854 m)  Wt 328 lb (148.78 kg)  BMI 43.28 kg/m2       Objective:   Physical Exam  Constitutional: He appears well-developed and well-nourished. No distress.  HENT:  Head: Normocephalic.  Eyes: Pupils are equal, round, and reactive to light.  Cardiovascular: Normal rate, regular rhythm and normal heart sounds.   Pulmonary/Chest: Effort normal and breath sounds  normal. No respiratory distress. He has no wheezes. He has no rales.  Skin: Skin is warm and dry.  Psychiatric: He has a normal mood and affect.  Assessment & Plan:  Patient seen along with Phineas Inches NP, who is currently in orientation for training purposes. I have personally examined pt and agree with assessment and plan.

## 2013-11-09 LAB — MICROALBUMIN / CREATININE URINE RATIO
Creatinine, Urine: 223.7 mg/dL
Microalb Creat Ratio: 14.2 mg/g (ref 0.0–30.0)
Microalb, Ur: 3.18 mg/dL — ABNORMAL HIGH (ref 0.00–1.89)

## 2013-11-15 ENCOUNTER — Other Ambulatory Visit: Payer: Self-pay | Admitting: Family

## 2013-11-15 ENCOUNTER — Ambulatory Visit: Admitting: Family

## 2013-11-16 ENCOUNTER — Ambulatory Visit (INDEPENDENT_AMBULATORY_CARE_PROVIDER_SITE_OTHER): Admitting: Family

## 2013-11-16 ENCOUNTER — Encounter: Payer: Self-pay | Admitting: Family

## 2013-11-16 ENCOUNTER — Ambulatory Visit: Payer: Self-pay | Admitting: Family

## 2013-11-16 VITALS — BP 132/92 | HR 75 | Temp 98.0°F | Resp 16 | Ht 73.0 in | Wt 325.0 lb

## 2013-11-16 DIAGNOSIS — I10 Essential (primary) hypertension: Secondary | ICD-10-CM

## 2013-11-16 LAB — BASIC METABOLIC PANEL
BUN: 8 mg/dL (ref 6–23)
CHLORIDE: 99 meq/L (ref 96–112)
CO2: 29 mEq/L (ref 19–32)
Calcium: 9.7 mg/dL (ref 8.4–10.5)
Creat: 1.08 mg/dL (ref 0.50–1.35)
GLUCOSE: 112 mg/dL — AB (ref 70–99)
Potassium: 3.8 mEq/L (ref 3.5–5.3)
Sodium: 140 mEq/L (ref 135–145)

## 2013-11-16 MED ORDER — ONETOUCH ULTRASOFT LANCETS MISC: Status: DC

## 2013-11-16 MED ORDER — ONETOUCH ULTRA 2 W/DEVICE KIT: PACK | Status: DC

## 2013-11-16 MED ORDER — GLUCOSE BLOOD VI STRP: ORAL_STRIP | Status: DC

## 2013-11-16 NOTE — Patient Instructions (Signed)
Please complete your lab work prior to leaving. Please schedule a follow up appointment in 3 months.

## 2013-11-16 NOTE — Assessment & Plan Note (Signed)
Improved with lasix.  Continue same, obtain bmet.

## 2013-11-16 NOTE — Progress Notes (Signed)
Subjective:    Patient ID: Charles Nash, male    DOB: 04/10/1966, 48 y.o.   MRN: [id number]  HPI  Charles Nash is a 48 yr old male who presents today for follow up of his hypertension. Last visit lasix was added to his regimen.    Reports that home reading have been 110/75.  He stopped testosterone due to weight gain.  He has lost 3 pounds since last visit.   Wt Readings from Last 3 Encounters:  11/16/13 325 lb (147.419 kg)  11/08/13 328 lb (148.78 kg)  10/16/13 300 lb (136.079 kg)     BP Readings from Last 3 Encounters:  11/16/13 132/92  11/08/13 152/94  10/16/13 151/73       Review of Systems See HPI  Past Medical History  Diagnosis Date  . Hyperlipidemia   . Diabetes mellitus   . Hypertension   . OSA (obstructive sleep apnea)   . Arthritis   . Allergy   . Personal history of colonic polyps   . GERD (gastroesophageal reflux disease)   . Fatty liver 08/06/2011  . Bilateral varicoceles   . Plantar fasciitis   . ADHD (attention deficit hyperactivity disorder)   . PTSD (post-traumatic stress disorder)     History   Social History  . Marital Status: Married    Spouse Name: N/A    Number of Children: 2  . Years of Education: N/A   Occupational History  . FOOTBALL COACH    Social History Main Topics  . Smoking status: Never Smoker   . Smokeless tobacco: Never Used  . Alcohol Use: No     Comment: occasional wine  . Drug Use: No  . Sexual Activity: Not on file   Other Topics Concern  . Not on file   Social History Narrative   Regular exercise:  No   Caffeine use:  2--32oz cups daily          Past Surgical History  Procedure Laterality Date  . Knee surgery  08/04/08    Right knee-- medial meniscus repair, attenuation anterior cruciate [name] MD [facility])   . Ankle surgery    . Foot surgery    . Shoulder surgery    . Varicocele excision    . Plantar fascia release      Family History  Problem Relation Age of  Onset  . Diabetes Mother   . Hypertension Mother   . Arthritis Mother   . Cancer Mother     uterine and breast cancer  . Hyperlipidemia Father   . Arthritis Father   . Cancer Father     prostate cancer  . Hyperlipidemia Maternal Grandmother   . Hypertension Maternal Grandmother   . Hyperlipidemia Maternal Grandfather   . Hypertension Maternal Grandfather   . Hyperlipidemia Paternal Grandmother   . Hypertension Paternal Grandmother   . Hyperlipidemia Paternal Grandfather   . Hypertension Paternal Grandfather   . Heart attack Paternal Grandfather   . Sudden death Neg Hx   . Colon cancer Neg Hx   . Esophageal cancer Neg Hx   . Stomach cancer Neg Hx   . Rectal cancer Neg Hx     Allergies  Allergen Reactions  . Oxycodone-Acetaminophen Hives and Itching    Current Outpatient Prescriptions on File Prior to Visit  Medication Sig Dispense Refill  . aspirin 81 MG tablet Take 1 tablet (81 mg total) by mouth daily.  100 tablet  3  . Blood Glucose Monitoring Suppl (  ONE TOUCH ULTRA 2) W/DEVICE KIT Use to check blood sugar once a day.  Dx 250.00  1 each  0  . Blood Pressure Monitoring (SPHYGMOMANOMETER) MISC Use as instructed to check blood pressure. Dx 401.9  1 each  0  . cetirizine (ZYRTEC) 10 MG tablet Take 1 tablet (10 mg total) by mouth daily.  30 tablet  11  . diphenhydrAMINE (BENADRYL) 25 mg capsule Take 1 capsule (25 mg total) by mouth every 6 (six) hours as needed for itching.  60 capsule  5  . escitalopram (LEXAPRO) 10 MG tablet Take 10 mg by mouth daily.      . Eszopiclone 3 MG TABS Take 1 tablet (3 mg total) by mouth at bedtime. Take immediately before bedtime  30 tablet  0  . furosemide (LASIX) 20 MG tablet Take 1 tablet (20 mg total) by mouth daily.  30 tablet  0  . glucose blood (ONE TOUCH ULTRA TEST) test strip Use to check blood sugar once daily as instructed. Dx 250.00  50 each  2  . hydrALAZINE (APRESOLINE) 50 MG tablet TAKE 1 TABLET TWICE A DAY  180 tablet  0  .  hydrocortisone 2.5 % lotion Apply topically 2 (two) times daily.  59 mL  0  . JANUVIA 100 MG tablet TAKE 1 TABLET DAILY  90 tablet  0  . Lancets (ONETOUCH ULTRASOFT) lancets Use to check blood sugar once daily as instructed.  Dx 250.00  100 each  1  . methylphenidate (CONCERTA) 27 MG CR tablet Take 27 mg by mouth every morning.      . metoprolol succinate (TOPROL-XL) 100 MG 24 hr tablet TAKE 1 TABLET DAILY WITH OR IMMEDIATELY FOLLOWING A MEAL  90 tablet  0  . Multiple Vitamin (MULTIVITAMIN) capsule Take 1 capsule by mouth daily.  30 capsule  11  . niacin (SLO-NIACIN) 500 MG tablet Take 1 tablet (500 mg total) by mouth at bedtime.  30 tablet  5  . Omega-3 Fatty Acids (FISH OIL) 1000 MG CAPS Take 2 capsules (2,000 mg total) by mouth 2 (two) times daily.    0  . omeprazole (PRILOSEC) 20 MG capsule TAKE 2 CAPSULES EVERY MORNING  180 capsule  0  . potassium chloride (K-DUR,KLOR-CON) 10 MEQ tablet TAKE 1 TABLET (10 MEQ TOTAL) BY MOUTH DAILY.  30 tablet  5  . sildenafil (VIAGRA) 100 MG tablet Take 1/2 to 1 tablet daily as needed  15 tablet  0  . TESTOSTERONE CYPIONATE IM Inject into the muscle. INJECT 1 ML EVERY OTHER WEEK. (pt unsure of strength)      . TRIBENZOR 40-10-25 MG TABS TAKE 1 TABLET DAILY  90 tablet  1  . VIAGRA 100 MG tablet TAKE ONE-HALF (1/2) TO 1 TABLET (50 MG TO 100 MG) DAILY AS NEEDED  15 tablet  5  . zolpidem (AMBIEN) 10 MG tablet Take 10 mg by mouth at bedtime as needed.      . [DISCONTINUED] potassium chloride (K-DUR) 10 MEQ tablet Take 1 tablet (10 mEq total) by mouth daily.  30 tablet  1   No current facility-administered medications on file prior to visit.    BP 132/92  Pulse 75  Temp(Src) 98 F (36.7 C) (Oral)  Resp 16  Ht $R'6\' 1"'Yt$  (1.854 m)  Wt 325 lb (147.419 kg)  BMI 42.89 kg/m2  SpO2 96%       Objective:   Physical Exam  Constitutional: He is oriented to person, place, and time. He appears  well-developed and well-nourished. No distress.  Cardiovascular: Normal  rate and regular rhythm.   No murmur heard. Pulmonary/Chest: Effort normal and breath sounds normal. No respiratory distress. He has no wheezes. He has no rales. He exhibits no tenderness.  Musculoskeletal: He exhibits no edema.  Neurological: He is alert and oriented to person, place, and time.  Psychiatric: He has a normal mood and affect. His behavior is normal. Judgment and thought content normal.          Assessment & Plan:

## 2013-11-16 NOTE — Progress Notes (Signed)
Pre visit review using our clinic review tool, if applicable. No additional management support is needed unless otherwise documented below in the visit note. 

## 2013-11-18 ENCOUNTER — Telehealth: Payer: Self-pay | Admitting: *Deleted

## 2013-11-18 MED ORDER — GLUCOSE BLOOD VI STRP
ORAL_STRIP | Status: DC
Start: 2013-11-18 — End: 2014-04-21

## 2013-11-18 MED ORDER — FREESTYLE LITE DEVI
Status: DC
Start: 2013-11-18 — End: 2014-09-13

## 2013-11-18 MED ORDER — FREESTYLE LANCETS MISC: Status: DC

## 2013-11-18 NOTE — Telephone Encounter (Signed)
Received fax from [facility] that onetouch glucometer system is non-preferred with his insurance.  They prefer Freestyle lite or Precision Extra. Notified pt and faxed rxs for freestyle lite.

## 2013-11-29 ENCOUNTER — Ambulatory Visit (INDEPENDENT_AMBULATORY_CARE_PROVIDER_SITE_OTHER)

## 2013-11-29 VITALS — BP 153/93 | HR 75 | Resp 17 | Ht 73.0 in | Wt 300.0 lb

## 2013-11-29 DIAGNOSIS — M7731 Calcaneal spur, right foot: Secondary | ICD-10-CM

## 2013-11-29 DIAGNOSIS — M79671 Pain in right foot: Secondary | ICD-10-CM

## 2013-11-29 DIAGNOSIS — M7730 Calcaneal spur, unspecified foot: Secondary | ICD-10-CM

## 2013-11-29 DIAGNOSIS — M79609 Pain in unspecified limb: Secondary | ICD-10-CM

## 2013-11-29 DIAGNOSIS — M7660 Achilles tendinitis, unspecified leg: Secondary | ICD-10-CM

## 2013-11-29 DIAGNOSIS — M7661 Achilles tendinitis, right leg: Secondary | ICD-10-CM

## 2013-11-29 MED ORDER — MELOXICAM 15 MG PO TABS: 15.0000 mg | ORAL_TABLET | Freq: Every day | ORAL | Status: DC

## 2013-11-29 NOTE — Patient Instructions (Signed)
ICE INSTRUCTIONS  Apply ice or cold pack to the affected area at least 3 times a day for 10-15 minutes each time.  You should also use ice after prolonged activity or vigorous exercise.  Do not apply ice longer than 20 minutes at one time.  Always keep a cloth between your skin and the ice pack to prevent burns.  Being consistent and following these instructions will help control your symptoms.  We suggest you purchase a gel ice pack because they are reusable and do bit leak.  Some of them are designed to wrap around the area.  Use the method that works best for you.  Here are some other suggestions for icing.   Use a frozen bag of peas or corn-inexpensive and molds well to your body, usually stays frozen for 10 to 20 minutes.  Wet a towel with cold water and squeeze out the excess until it's damp.  Place in a bag in the freezer for 20 minutes. Then remove and use.  Use alternating hot compress and cold compresses 10 minutes each repeat 2 or 3 times every day.

## 2013-11-29 NOTE — Progress Notes (Signed)
   Subjective:    Patient ID: Charles Nash, male    DOB: 1966/03/22, 48 y.o.   MRN: [id number]  HPI Comments: N heel pain L right posterior lateral heel D 5 months O bumped the posterior heel on a step while moving object C sudden sharp pain A pressure or hitting the area T open heeled shoes    Foot Pain Associated symptoms include arthralgias, congestion and diaphoresis.      Review of Systems  Constitutional: Positive for diaphoresis.  HENT: Positive for congestion and sinus pressure.   Musculoskeletal: Positive for arthralgias and back pain.  All other systems reviewed and are negative.      Objective:   Physical Exam 48 year old [place] male process this time for initial visit well-developed well-nourished oriented x3 presents with pain posterior lateral right heel. Been going on for couple of weeks now been wearing open heeled shoes and sandals because the pain. Has had plantar fasciitis surgery in the past on both heels however is not plantar is the posterior lateral heel is painful at this time on the Achilles tendon insertion on the right left is asymptomatic.  Lower extremity objective findings revealed after status is intact DP postal for PT one over 4 bilateral capillary refill time 3 seconds. Epicritic and proprioceptive sensations intact bilateral normal plantar response DTRs not listed neurologically skin color normal pigment normal hair growth absent nails somewhat criptotic orthopedic biomechanical exam there is rectus foot type ankle mid tarsus subtalar joint motion is normal mild flexible digital contractures noted bilateral x-rays of the right foot demonstrate well-developed inferior as well as retrocalcaneal spurring mild fascial thickening noted also Achilles tendon thickening noted intratendinous spur noted on posterior heel area. Remainder of exam unremarkable noncontributory. Patient has a history of diabetes and complications as well.         Assessment & Plan:  Assessment this time is Achilles tendinitis and retrocalcaneal spurring with possible Achilles tendinosis a new posterior lateral right heel. Patient I can for steroid injection due its location at this time is wearing a pair flip flops and sandals. 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 Patient does have some GI issues with reflux are this time my recommendations patient placed on an NSAID therapy prescription for MOBIC 15 mg once daily sent to pharmacy. Also recommended warm compress ice pack alternating hot and cold therapy to posterior heel area suggested topical pain cream such as a salon pas, or icy hot or bio freezer the posterior heel area. Maintain open back heel is possible or a cushioned heel shoe. Prescription for [facility] is given recheck in 2-3-4 weeks for followup if no proven possible consideration for more invasive option steroid dose pack for steroid injection immobilization in a air fracture boot if needed at recheck in 3 weeks as indicated extra graft Harriet Masson DPM

## 2013-12-07 ENCOUNTER — Other Ambulatory Visit: Payer: Self-pay | Admitting: Family

## 2013-12-07 MED ORDER — FUROSEMIDE 20 MG PO TABS: 20.0000 mg | ORAL_TABLET | Freq: Every day | ORAL | Status: DC

## 2013-12-07 NOTE — Telephone Encounter (Signed)
Rx request from [place] for Potassium Chloride, listed as Active medication in EMR; but has not been refilled in our system since 08.22.2013 [facility]?/SLS Please Advise.  Medication Detail      Disp Refills Start End     potassium chloride (K-DUR,KLOR-CON) 10 MEQ tablet 30 tablet 5 12/04/2011     Sig: TAKE 1 TABLET (10 MEQ TOTAL) BY MOUTH DAILY.     Pharmacy    [id number] - [place], [place] - [address] AT [place]

## 2013-12-10 NOTE — Telephone Encounter (Signed)
Please contact pt re: unread 8/29 mychart message.

## 2013-12-12 MED ORDER — ESZOPICLONE 3 MG PO TABS: 3.0000 mg | ORAL_TABLET | Freq: Every day | ORAL | Status: DC

## 2013-12-12 MED ORDER — POTASSIUM CHLORIDE CRYS ER 10 MEQ PO TBCR: EXTENDED_RELEASE_TABLET | ORAL | Status: DC

## 2013-12-12 NOTE — Telephone Encounter (Signed)
Rx faxed

## 2013-12-12 NOTE — Telephone Encounter (Signed)
Notified pt. He voices understanding and wanted to know why we denied his Potassium refill request. Per [employer], we have not filled rx since 2013. Pt thinks he may have been getting refills through the [place] but wants to know if he still needs to continue medication daily?  Please advise.

## 2013-12-12 NOTE — Telephone Encounter (Signed)
If he was getting through the [place] he should continue and we can send refill.

## 2013-12-12 NOTE — Telephone Encounter (Signed)
Notified pt. He is also requesting refill of eszopiclone. Last refill 11/08/13.  Rx printed and forwarded to PR[facility] for signature.

## 2013-12-13 ENCOUNTER — Telehealth: Payer: Self-pay | Admitting: Family

## 2013-12-13 ENCOUNTER — Encounter: Payer: Self-pay | Admitting: Family

## 2013-12-13 ENCOUNTER — Ambulatory Visit (INDEPENDENT_AMBULATORY_CARE_PROVIDER_SITE_OTHER): Admitting: Family

## 2013-12-13 VITALS — BP 130/80 | HR 81 | Temp 97.9°F | Resp 16 | Ht 73.0 in | Wt 330.4 lb

## 2013-12-13 DIAGNOSIS — Z9109 Other allergy status, other than to drugs and biological substances: Secondary | ICD-10-CM

## 2013-12-13 DIAGNOSIS — Z23 Encounter for immunization: Secondary | ICD-10-CM

## 2013-12-13 DIAGNOSIS — Z91048 Other nonmedicinal substance allergy status: Secondary | ICD-10-CM

## 2013-12-13 NOTE — Assessment & Plan Note (Signed)
I agree that he should not be living in current building due to his reaction to presumed mold.  I have given him a letter for his land lord.

## 2013-12-13 NOTE — Progress Notes (Signed)
Subjective:    Patient ID: Charles Nash, male    DOB: 06-03-1965, 48 y.o.   MRN: [id number]  HPI  Charles Nash is a 48 yr old male who presents today with chief complaint of allergies. Notes that there was some water on the basement floor.  He was told that there was condensation.  Reports a musty smell in the house. Reports that he got a headache, eyes itching.  He is renting the house. Has hoarsenss.  Went back to his old house and within 1 hour of leaving he started feeling better. Reports that he will be moving out of the new rental house due to this reaction.   Review of Systems See HPI  Past Medical History  Diagnosis Date  . Hyperlipidemia   . Diabetes mellitus   . Hypertension   . OSA (obstructive sleep apnea)   . Arthritis   . Allergy   . Personal history of colonic polyps   . GERD (gastroesophageal reflux disease)   . Fatty liver 08/06/2011  . Bilateral varicoceles   . Plantar fasciitis   . ADHD (attention deficit hyperactivity disorder)   . PTSD (post-traumatic stress disorder)     History   Social History  . Marital Status: Married    Spouse Name: N/A    Number of Children: 2  . Years of Education: N/A   Occupational History  . FOOTBALL COACH    Social History Main Topics  . Smoking status: Never Smoker   . Smokeless tobacco: Never Used  . Alcohol Use: No     Comment: occasional wine  . Drug Use: No  . Sexual Activity: Not on file   Other Topics Concern  . Not on file   Social History Narrative   Regular exercise:  No   Caffeine use:  2--32oz cups daily          Past Surgical History  Procedure Laterality Date  . Knee surgery  08/04/08    Right knee-- medial meniscus repair, attenuation anterior cruciate [name] MD [facility])   . Ankle surgery    . Foot surgery    . Shoulder surgery    . Varicocele excision    . Plantar fascia release      Family History  Problem Relation Age of Onset  . Diabetes Mother   .  Hypertension Mother   . Arthritis Mother   . Cancer Mother     uterine and breast cancer  . Hyperlipidemia Father   . Arthritis Father   . Cancer Father     prostate cancer  . Hyperlipidemia Maternal Grandmother   . Hypertension Maternal Grandmother   . Hyperlipidemia Maternal Grandfather   . Hypertension Maternal Grandfather   . Hyperlipidemia Paternal Grandmother   . Hypertension Paternal Grandmother   . Hyperlipidemia Paternal Grandfather   . Hypertension Paternal Grandfather   . Heart attack Paternal Grandfather   . Sudden death Neg Hx   . Colon cancer Neg Hx   . Esophageal cancer Neg Hx   . Stomach cancer Neg Hx   . Rectal cancer Neg Hx     Allergies  Allergen Reactions  . Oxycodone-Acetaminophen Hives and Itching    Current Outpatient Prescriptions on File Prior to Visit  Medication Sig Dispense Refill  . aspirin 81 MG tablet Take 1 tablet (81 mg total) by mouth daily.  100 tablet  3  . Blood Glucose Monitoring Suppl (FREESTYLE LITE) DEVI Use to check blood sugar once a  day.  DX 250.00  1 each  0  . Blood Glucose Monitoring Suppl (ONE TOUCH ULTRA 2) W/DEVICE KIT Use to check blood sugar once a day.  Dx 250.00  1 each  0  . Blood Pressure Monitoring (SPHYGMOMANOMETER) MISC Use as instructed to check blood pressure. Dx 401.9  1 each  0  . cetirizine (ZYRTEC) 10 MG tablet Take 1 tablet (10 mg total) by mouth daily.  30 tablet  11  . diphenhydrAMINE (BENADRYL) 25 mg capsule Take 1 capsule (25 mg total) by mouth every 6 (six) hours as needed for itching.  60 capsule  5  . escitalopram (LEXAPRO) 10 MG tablet Take 10 mg by mouth daily.      . Eszopiclone 3 MG TABS Take 1 tablet (3 mg total) by mouth at bedtime. Take immediately before bedtime  30 tablet  0  . furosemide (LASIX) 20 MG tablet Take 1 tablet (20 mg total) by mouth daily.  30 tablet  0  . glucose blood (FREESTYLE LITE) test strip Use as instructed to check blood sugar once a day.  DX 250.00  100 each  1  .  hydrALAZINE (APRESOLINE) 50 MG tablet TAKE 1 TABLET TWICE A DAY  180 tablet  0  . hydrocortisone 2.5 % lotion Apply topically 2 (two) times daily.  59 mL  0  . JANUVIA 100 MG tablet TAKE 1 TABLET DAILY  90 tablet  0  . Lancets (FREESTYLE) lancets Use to check blood sugar once a day.  DX 250.00  100 each  1  . meloxicam (MOBIC) 15 MG tablet Take 1 tablet (15 mg total) by mouth daily.  30 tablet  1  . methylphenidate (CONCERTA) 27 MG CR tablet Take 27 mg by mouth every morning.      . metoprolol succinate (TOPROL-XL) 100 MG 24 hr tablet TAKE 1 TABLET DAILY WITH OR IMMEDIATELY FOLLOWING A MEAL  90 tablet  0  . Multiple Vitamin (MULTIVITAMIN) capsule Take 1 capsule by mouth daily.  30 capsule  11  . niacin (SLO-NIACIN) 500 MG tablet Take 1 tablet (500 mg total) by mouth at bedtime.  30 tablet  5  . Omega-3 Fatty Acids (FISH OIL) 1000 MG CAPS Take 2 capsules (2,000 mg total) by mouth 2 (two) times daily.    0  . omeprazole (PRILOSEC) 20 MG capsule TAKE 2 CAPSULES EVERY MORNING  180 capsule  0  . potassium chloride (K-DUR,KLOR-CON) 10 MEQ tablet TAKE 1 TABLET (10 MEQ TOTAL) BY MOUTH DAILY.  30 tablet  3  . sildenafil (VIAGRA) 100 MG tablet Take 1/2 to 1 tablet daily as needed  15 tablet  0  . TESTOSTERONE CYPIONATE IM Inject into the muscle. INJECT 1 ML EVERY OTHER WEEK. (pt unsure of strength)      . TRIBENZOR 40-10-25 MG TABS TAKE 1 TABLET DAILY  90 tablet  1  . VIAGRA 100 MG tablet TAKE ONE-HALF (1/2) TO 1 TABLET (50 MG TO 100 MG) DAILY AS NEEDED  15 tablet  5  . zolpidem (AMBIEN) 10 MG tablet Take 10 mg by mouth at bedtime as needed.      . [DISCONTINUED] potassium chloride (K-DUR) 10 MEQ tablet Take 1 tablet (10 mEq total) by mouth daily.  30 tablet  1   No current facility-administered medications on file prior to visit.    BP 130/80  Pulse 81  Temp(Src) 97.9 F (36.6 C) (Oral)  Resp 16  Ht 6' 1" (1.854 m)  Wt  330 lb 6.4 oz (149.868 kg)  BMI 43.60 kg/m2  SpO2 98%       Objective:     Physical Exam  Constitutional: He is oriented to person, place, and time. He appears well-developed and well-nourished. No distress.  HENT:  Head: Normocephalic and atraumatic.  Right Ear: Tympanic membrane and ear canal normal.  Left Ear: Tympanic membrane and ear canal normal.  Cardiovascular: Normal rate and regular rhythm.   No murmur heard. Pulmonary/Chest: Effort normal and breath sounds normal. No respiratory distress. He has no wheezes. He has no rales. He exhibits no tenderness.  Musculoskeletal: He exhibits no edema.  Neurological: He is alert and oriented to person, place, and time.  Psychiatric: He has a normal mood and affect. His behavior is normal. Judgment and thought content normal.          Assessment & Plan:

## 2013-12-13 NOTE — Patient Instructions (Signed)
Please follow up in 3 months.  

## 2013-12-13 NOTE — Telephone Encounter (Signed)
error 

## 2013-12-13 NOTE — Progress Notes (Signed)
Pre visit review using our clinic review tool, if applicable. No additional management support is needed unless otherwise documented below in the visit note. 

## 2013-12-16 ENCOUNTER — Other Ambulatory Visit: Payer: Self-pay | Admitting: *Deleted

## 2013-12-16 ENCOUNTER — Ambulatory Visit (INDEPENDENT_AMBULATORY_CARE_PROVIDER_SITE_OTHER)

## 2013-12-16 VITALS — BP 178/103 | HR 105 | Resp 16

## 2013-12-16 DIAGNOSIS — M7730 Calcaneal spur, unspecified foot: Secondary | ICD-10-CM

## 2013-12-16 DIAGNOSIS — M79671 Pain in right foot: Secondary | ICD-10-CM

## 2013-12-16 DIAGNOSIS — M7660 Achilles tendinitis, unspecified leg: Secondary | ICD-10-CM

## 2013-12-16 DIAGNOSIS — M7731 Calcaneal spur, right foot: Secondary | ICD-10-CM

## 2013-12-16 DIAGNOSIS — M79609 Pain in unspecified limb: Secondary | ICD-10-CM

## 2013-12-16 DIAGNOSIS — M7661 Achilles tendinitis, right leg: Secondary | ICD-10-CM

## 2013-12-16 MED ORDER — PREDNISONE 10 MG PO KIT: PACK | ORAL | Status: DC

## 2013-12-16 NOTE — Telephone Encounter (Signed)
I attempted to escribe the prescription it would not go.  So, I called it into his pharmacy.  Prednisone Kit

## 2013-12-16 NOTE — Patient Instructions (Signed)
ICE INSTRUCTIONS  Apply ice or cold pack to the affected area at least 3 times a day for 10-15 minutes each time.  You should also use ice after prolonged activity or vigorous exercise.  Do not apply ice longer than 20 minutes at one time.  Always keep a cloth between your skin and the ice pack to prevent burns.  Being consistent and following these instructions will help control your symptoms.  We suggest you purchase a gel ice pack because they are reusable and do bit leak.  Some of them are designed to wrap around the area.  Use the method that works best for you.  Here are some other suggestions for icing.   Use a frozen bag of peas or corn-inexpensive and molds well to your body, usually stays frozen for 10 to 20 minutes.  Wet a towel with cold water and squeeze out the excess until it's damp.  Place in a bag in the freezer for 20 minutes. Then remove and use.   Also alternate warm compress ice pack to the back of heel 2 or 3 times daily apply heat ice heat and ice as needed for the next 2 weeks to

## 2013-12-16 NOTE — Progress Notes (Signed)
   Subjective:    Patient ID: Charles Nash, male    DOB: Sep 09, 1965, 48 y.o.   MRN: [id number]  HPI Comments: "It has gotten worse and really bothering me"  Follow up achilles tendonitis right  Foot Pain      Review of Systems no new findings or systemic changes noted     Objective:   Physical Exam Neurovascular status continues to be intact pedal pulses are palpable DP +2/4 PT one over 4 bilateral Refill time 3 seconds patient continues to have significant retrocalcaneal pain Achilles tendon insertion pain right heel currently tolerated could not take the [facility] palliative past he had a reaction GI Robert reaction with it it contagious fingertip has been applying some ice and try to rest it however 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 Remainder the exam intact and unchanged from previous visit mild digital contractures noted no signs of fracture or osseous abnormalities again pain retrocalcaneal area posterior and lateral heel and calcaneal tubercle.       Assessment & Plan:  Assessment Achilles tendinitis retrocalcaneal bursitis I can't rule out tendinosis Achilles patient Charles Nash x-ray her pop out has full function no defect is noted guarded motion is done as painful tender symptomatic. At this time patient placed in air fracture walker for mobilization for to 4 weeks reappointed 2 weeks for followup and reevaluation if no significant improvement we'll consider MRI evaluation for possible preoperative assessment may require heel lysis and heel spur resection in the future. We'll avoid steroid injections due to risk of rupture or tear at this time patient placed on a Sterapred DS Dosepak x12 days understands this will raise his blood sugar temporarily however may help reduce inflammation significantly avoid activities notable listed history cut back and pushing needs to sit whenever possible keep foot elevated when  possible recheck in 2 weeks as scheduled  Harriet Masson DPM

## 2013-12-20 ENCOUNTER — Ambulatory Visit

## 2013-12-21 ENCOUNTER — Other Ambulatory Visit: Payer: Self-pay | Admitting: Family

## 2013-12-25 ENCOUNTER — Other Ambulatory Visit: Payer: Self-pay | Admitting: Family

## 2013-12-26 ENCOUNTER — Other Ambulatory Visit: Payer: Self-pay | Admitting: Family

## 2013-12-30 ENCOUNTER — Ambulatory Visit (INDEPENDENT_AMBULATORY_CARE_PROVIDER_SITE_OTHER)

## 2013-12-30 VITALS — BP 174/95 | HR 82 | Resp 16

## 2013-12-30 DIAGNOSIS — M7730 Calcaneal spur, unspecified foot: Secondary | ICD-10-CM

## 2013-12-30 DIAGNOSIS — M79609 Pain in unspecified limb: Secondary | ICD-10-CM

## 2013-12-30 DIAGNOSIS — M79671 Pain in right foot: Secondary | ICD-10-CM

## 2013-12-30 DIAGNOSIS — M7731 Calcaneal spur, right foot: Secondary | ICD-10-CM

## 2013-12-30 DIAGNOSIS — M7660 Achilles tendinitis, unspecified leg: Secondary | ICD-10-CM

## 2013-12-30 DIAGNOSIS — M7661 Achilles tendinitis, right leg: Secondary | ICD-10-CM

## 2013-12-30 NOTE — Patient Instructions (Signed)

## 2013-12-30 NOTE — Progress Notes (Signed)
   Subjective:    Patient ID: Charles Nash, male    DOB: Nov 10, 1965, 48 y.o.   MRN: [id number]  HPI Comments: "That boot made my foot worse. Its feels about the same"  Follow up achilles tendonitis right   Foot Pain      Review of Systems no new findings or systemic changes noted     Objective:   Physical Exam Neurovascular status is intact pedal pulses palpable epicritic and proprioceptive sensations intact patient's have not been able taking MOBIC the air fracture boot may help temporarily however it is having some difficulty did not tolerate it well to switch over to. New balance walking or athletic shoes there is still pain tenderness both on palpation and with flexion of the foot and Achilles tendon insertion and 30 tenderness area of the retrocalcaneal area right heel still painful tender on palpation and on ambulation is been going on for several months with no significant improvement has tried NSAIDs I use anti-inflammatories and mobilization with a fracture boot with no significant improvement. Still some swelling at the inferior calcaneal area although may for a redo somewhat with the air fracture boot.       Assessment & Plan:  Assessment Achilles tendinitis retrocalcaneal spurring right heel cannot rule out tendinosis of the Achilles or partial tear or rupture. Plan at this time based on my recommendation and per patient request is for an MRI evaluation of the Achilles tendon right heel to assess for any tear or other tendon pathology such as fat necrosis or fibrosis.. Patient may be candidate for more invasive or surgical options based on findings of the MRI. Will be scheduled in 2-3 weeks for reevaluation of MRI results at the plan followup treatment.  Harriet Masson DPM

## 2013-12-30 NOTE — Addendum Note (Signed)
Addended by: Clovis Riley E on: 12/30/2013 10:08 AM   Modules accepted: Orders

## 2014-01-05 ENCOUNTER — Ambulatory Visit: Admitting: Family Medicine

## 2014-01-07 ENCOUNTER — Ambulatory Visit
Admission: RE | Admit: 2014-01-07 | Discharge: 2014-01-07 | Disposition: A | Discharge status: Home / Self Care | Source: Ambulatory Visit

## 2014-01-07 DIAGNOSIS — M7661 Achilles tendinitis, right leg: Secondary | ICD-10-CM

## 2014-01-08 ENCOUNTER — Inpatient Hospital Stay: Admission: RE | Admit: 2014-01-08 | Source: Ambulatory Visit

## 2014-01-16 ENCOUNTER — Encounter: Payer: Self-pay | Admitting: Physician Assistant

## 2014-01-16 ENCOUNTER — Ambulatory Visit (INDEPENDENT_AMBULATORY_CARE_PROVIDER_SITE_OTHER): Admitting: Physician Assistant

## 2014-01-16 VITALS — BP 134/70 | HR 72 | Temp 98.0°F | Resp 18 | Wt 327.6 lb

## 2014-01-16 DIAGNOSIS — L500 Allergic urticaria: Secondary | ICD-10-CM

## 2014-01-16 MED ORDER — METHYLPREDNISOLONE ACETATE 80 MG/ML IJ SUSP
80.0000 mg | Freq: Once | INTRAMUSCULAR | Status: AC
  Administered 2014-01-16: 80 mg via INTRAMUSCULAR

## 2014-01-16 MED ORDER — PREDNISONE 10 MG PO TABS: 10.0000 mg | ORAL_TABLET | Freq: Every day | ORAL | Status: DC

## 2014-01-16 NOTE — Patient Instructions (Addendum)
Prednisone 1 pill daily for 14 days. Make sure to take with a full meal to prevent nausea.  You can also take a daily antihistamine as discuss to help relieve your symptoms. (Zyretec etc)  If emergency symptoms discussed during visit developed, seek medical attention immediately.  Followup as needed, or for worsening or persistent symptoms despite treatment.    Hives Hives are itchy, red, puffy (swollen) areas of the skin. Hives can change in size and location on your body. Hives can come and go for hours, days, or weeks. Hives do not spread from person to person (noncontagious). Scratching, exercise, and stress can make your hives worse. HOME CARE  Avoid things that cause your hives (triggers).  Take antihistamine medicines as told by your doctor. Do not drive while taking an antihistamine.  Take any other medicines for itching as told by your doctor.  Wear loose-fitting clothing.  Keep all doctor visits as told. GET HELP RIGHT AWAY IF:   You have a fever.  Your tongue or lips are puffy.  You have trouble breathing or swallowing.  You feel tightness in the throat or chest.  You have belly (abdominal) pain.  You have lasting or severe itching that is not helped by medicine.  You have painful or puffy joints. These problems may be the first sign of a life-threatening allergic reaction. Call your local emergency services (911 in [place] MAKE SURE YOU:   Understand these instructions.  Will watch your condition.  Will get help right away if you are not doing well or get worse. Document Released: 01/08/2008 Document Revised: 09/30/2011 Document Reviewed: 06/24/2011 [facility] Patient Information 2015 [place], [place]. This information is not intended to replace advice given to you by your health care provider. Make sure you discuss any questions you have with your health care provider.

## 2014-01-16 NOTE — Progress Notes (Signed)
Pre visit review using our clinic review tool, if applicable. No additional management support is needed unless otherwise documented below in the visit note. 

## 2014-01-16 NOTE — Progress Notes (Signed)
Subjective:    Patient ID: Charles Nash, male    DOB: [birth year], 48 y.o.   MRN: [id number]  Urticaria This is a new problem. The current episode started yesterday. The problem has been rapidly worsening since onset. The rash is diffuse (arms, back, shoulders.). Rash characteristics: hives. It is unknown (states that this happens yearly, and he doesn't know why.) if there was an exposure to a precipitant. Pertinent negatives include no anorexia, congestion, cough, diarrhea, eye pain, facial edema, fatigue, fever, joint pain, nail changes, rhinorrhea, shortness of breath, sore throat or vomiting. Past treatments include nothing. His past medical history is significant for allergies. There is no history of asthma or eczema.      Review of Systems  Constitutional: Negative for fever, chills and fatigue.  HENT: Negative for congestion, facial swelling, rhinorrhea, sore throat and trouble swallowing.        Pt denies any throat swelling.  Eyes: Negative for pain.  Respiratory: Negative for cough and shortness of breath.   Cardiovascular: Negative for chest pain.  Gastrointestinal: Negative for nausea, vomiting, diarrhea and anorexia.  Musculoskeletal: Negative for joint pain.  Skin: Negative for nail changes.  Neurological: Negative for syncope and headaches.  All other systems reviewed and are negative.    Past Medical History  Diagnosis Date  . Hyperlipidemia   . Diabetes mellitus   . Hypertension   . OSA (obstructive sleep apnea)   . Arthritis   . Allergy   . Personal history of colonic polyps   . GERD (gastroesophageal reflux disease)   . Fatty liver 08/06/2011  . Bilateral varicoceles   . Plantar fasciitis   . ADHD (attention deficit hyperactivity disorder)   . PTSD (post-traumatic stress disorder)     History   Social History  . Marital Status: Married    Spouse Name: N/A    Number of Children: 2  . Years of Education: N/A   Occupational History  .  FOOTBALL COACH    Social History Main Topics  . Smoking status: Never Smoker   . Smokeless tobacco: Never Used  . Alcohol Use: No     Comment: occasional wine  . Drug Use: No  . Sexual Activity: Not on file   Other Topics Concern  . Not on file   Social History Narrative   Regular exercise:  No   Caffeine use:  2--32oz cups daily          Past Surgical History  Procedure Laterality Date  . Knee surgery  08/04/08    Right knee-- medial meniscus repair, attenuation anterior cruciate [name] MD [facility])   . Ankle surgery    . Foot surgery    . Shoulder surgery    . Varicocele excision    . Plantar fascia release      Family History  Problem Relation Age of Onset  . Diabetes Mother   . Hypertension Mother   . Arthritis Mother   . Cancer Mother     uterine and breast cancer  . Hyperlipidemia Father   . Arthritis Father   . Cancer Father     prostate cancer  . Hyperlipidemia Maternal Grandmother   . Hypertension Maternal Grandmother   . Hyperlipidemia Maternal Grandfather   . Hypertension Maternal Grandfather   . Hyperlipidemia Paternal Grandmother   . Hypertension Paternal Grandmother   . Hyperlipidemia Paternal Grandfather   . Hypertension Paternal Grandfather   . Heart attack Paternal Grandfather   . Sudden death  Neg Hx   . Colon cancer Neg Hx   . Esophageal cancer Neg Hx   . Stomach cancer Neg Hx   . Rectal cancer Neg Hx     Allergies  Allergen Reactions  . Oxycodone-Acetaminophen Hives and Itching    Current Outpatient Prescriptions on File Prior to Visit  Medication Sig Dispense Refill  . aspirin 81 MG tablet Take 1 tablet (81 mg total) by mouth daily.  100 tablet  3  . Blood Glucose Monitoring Suppl (FREESTYLE LITE) DEVI Use to check blood sugar once a day.  DX 250.00  1 each  0  . Blood Pressure Monitoring (SPHYGMOMANOMETER) MISC Use as instructed to check blood pressure. Dx 401.9  1 each  0  . cetirizine (ZYRTEC) 10 MG tablet  Take 1 tablet (10 mg total) by mouth daily.  30 tablet  11  . diphenhydrAMINE (BENADRYL) 25 mg capsule Take 1 capsule (25 mg total) by mouth every 6 (six) hours as needed for itching.  60 capsule  5  . escitalopram (LEXAPRO) 10 MG tablet Take 10 mg by mouth daily.      . Eszopiclone 3 MG TABS Take 1 tablet (3 mg total) by mouth at bedtime. Take immediately before bedtime  30 tablet  0  . furosemide (LASIX) 20 MG tablet Take 1 tablet (20 mg total) by mouth daily.  30 tablet  0  . glucose blood (FREESTYLE LITE) test strip Use as instructed to check blood sugar once a day.  DX 250.00  100 each  1  . hydrALAZINE (APRESOLINE) 50 MG tablet TAKE 1 TABLET TWICE A DAY  180 tablet  1  . hydrocortisone 2.5 % lotion Apply topically 2 (two) times daily.  59 mL  0  . JANUVIA 100 MG tablet TAKE 1 TABLET DAILY  90 tablet  1  . Lancets (FREESTYLE) lancets Use to check blood sugar once a day.  DX 250.00  100 each  1  . meloxicam (MOBIC) 15 MG tablet Take 1 tablet (15 mg total) by mouth daily.  30 tablet  1  . methylphenidate (CONCERTA) 27 MG CR tablet Take 27 mg by mouth every morning.      . metoprolol succinate (TOPROL-XL) 100 MG 24 hr tablet TAKE 1 TABLET DAILY WITH OR IMMEDIATELY FOLLOWING A MEAL  90 tablet  1  . Multiple Vitamin (MULTIVITAMIN) capsule Take 1 capsule by mouth daily.  30 capsule  11  . niacin (SLO-NIACIN) 500 MG tablet Take 1 tablet (500 mg total) by mouth at bedtime.  30 tablet  5  . Omega-3 Fatty Acids (FISH OIL) 1000 MG CAPS Take 2 capsules (2,000 mg total) by mouth 2 (two) times daily.    0  . omeprazole (PRILOSEC) 20 MG capsule TAKE 2 CAPSULES EVERY MORNING  180 capsule  0  . potassium chloride (K-DUR,KLOR-CON) 10 MEQ tablet TAKE 1 TABLET (10 MEQ TOTAL) BY MOUTH DAILY.  30 tablet  3  . sildenafil (VIAGRA) 100 MG tablet Take 1/2 to 1 tablet daily as needed  15 tablet  0  . TESTOSTERONE CYPIONATE IM Inject into the muscle. INJECT 1 ML EVERY OTHER WEEK. (pt unsure of strength)      .  TRIBENZOR 40-10-25 MG TABS TAKE 1 TABLET DAILY  90 tablet  1  . zolpidem (AMBIEN) 10 MG tablet Take 10 mg by mouth at bedtime as needed.      . [DISCONTINUED] potassium chloride (K-DUR) 10 MEQ tablet Take 1 tablet (10 mEq total) by  mouth daily.  30 tablet  1   No current facility-administered medications on file prior to visit.    EXAM: BP 134/70  Pulse 72  Temp(Src) 98 F (36.7 C) (Oral)  Resp 18  Wt 327 lb 9.6 oz (148.598 kg)     Objective:   Physical Exam  Nursing note and vitals reviewed. Constitutional: He is oriented to person, place, and time. He appears well-developed and well-nourished. No distress.  HENT:  Head: Normocephalic and atraumatic.  Mouth/Throat: Oropharynx is clear and moist. No oropharyngeal exudate.  Eyes: Conjunctivae and EOM are normal. Pupils are equal, round, and reactive to light.  Neck: Normal range of motion. Neck supple.  Cardiovascular: Normal rate, regular rhythm and intact distal pulses.   Pulmonary/Chest: Effort normal and breath sounds normal. No respiratory distress. He has no wheezes. He exhibits no tenderness.  Neurological: He is alert and oriented to person, place, and time.  Skin: Skin is warm and dry. Rash noted. He is not diaphoretic. No pallor.  Between 6 to 8 areas of urticaria effecting the bilateral arms and back area. These area mildly tender to direct pressure. There is no fluctuance.   Psychiatric: He has a normal mood and affect. His behavior is normal. Judgment and thought content normal.     Lab Results  Component Value Date   WBC 5.6 02/18/2012   HGB 14.2 02/18/2012   HCT 41.5 02/18/2012   PLT 284 02/18/2012   GLUCOSE 112* 11/16/2013   CHOL 166 11/08/2013   TRIG 135 11/08/2013   HDL 46 11/08/2013   LDLCALC 93 11/08/2013   ALT 32 11/08/2013   AST 31 11/08/2013   NA 140 11/16/2013   K 3.8 11/16/2013   CL 99 11/16/2013   CREATININE 1.08 11/16/2013   BUN 8 11/16/2013   CO2 29 11/16/2013   TSH 0.380 01/03/2013   PSA 1.20 07/12/2012    HGBA1C 6.1* 11/08/2013   MICROALBUR 3.18* 11/08/2013        Assessment & Plan:  Claus was seen today for urticaria.  Diagnoses and associated orders for this visit:  Allergic urticaria Comments: Pt unsure what causes it, but has seen allergist, usually receives prednisone to relieve and takes daily antihistamine, will continue. Wathful waiting. - predniSONE (DELTASONE) 10 MG tablet; Take 1 tablet (10 mg total) by mouth daily with breakfast. - methylPREDNISolone acetate (DEPO-MEDROL) injection 80 mg; Inject 1 mL (80 mg total) into the muscle once.    Pt initially declined oral prednisone course, stating that he preferred injection, however agreed to a low dose of prednisone for the next 2 weeks following the injection in order to prevent recurrence of urticaria. Discussed adverse effects to prednisone, and that pt may notice a temporary rise in his daily blood sugars while on the medicine. The pt is amenable to this plan.  Return precautions provided, and patient handout on hives.  Plan to follow up as needed, or for worsening or persistent symptoms despite treatment.  Patient Instructions  Prednisone 1 pill daily for 14 days. Make sure to take with a full meal to prevent nausea.  You can also take a daily antihistamine as discuss to help relieve your symptoms. (Zyretec etc)  If emergency symptoms discussed during visit developed, seek medical attention immediately.  Followup as needed, or for worsening or persistent symptoms despite treatment.

## 2014-01-17 ENCOUNTER — Ambulatory Visit (INDEPENDENT_AMBULATORY_CARE_PROVIDER_SITE_OTHER)

## 2014-01-17 VITALS — BP 152/85 | HR 76 | Resp 16 | Ht 73.0 in | Wt 300.0 lb

## 2014-01-17 DIAGNOSIS — M7661 Achilles tendinitis, right leg: Secondary | ICD-10-CM

## 2014-01-17 DIAGNOSIS — M7731 Calcaneal spur, right foot: Secondary | ICD-10-CM

## 2014-01-17 DIAGNOSIS — M79671 Pain in right foot: Secondary | ICD-10-CM

## 2014-01-17 DIAGNOSIS — M7751 Other enthesopathy of right foot: Secondary | ICD-10-CM

## 2014-01-17 MED ORDER — TRIAMCINOLONE ACETONIDE 10 MG/ML IJ SUSP: 10.0000 mg | Freq: Once | INTRAMUSCULAR | Status: DC

## 2014-01-17 NOTE — Patient Instructions (Signed)
ICE INSTRUCTIONS  Apply ice or cold pack to the affected area at least 3 times a day for 10-15 minutes each time.  You should also use ice after prolonged activity or vigorous exercise.  Do not apply ice longer than 20 minutes at one time.  Always keep a cloth between your skin and the ice pack to prevent burns.  Being consistent and following these instructions will help control your symptoms.  We suggest you purchase a gel ice pack because they are reusable and do bit leak.  Some of them are designed to wrap around the area.  Use the method that works best for you.  Here are some other suggestions for icing.   Use a frozen bag of peas or corn-inexpensive and molds well to your body, usually stays frozen for 10 to 20 minutes.  Wet a towel with cold water and squeeze out the excess until it's damp.  Place in a bag in the freezer for 20 minutes. Then remove and use.   Alternate between applying a hot compresses or warm or hot towel for 10 minutes the ice pack for 10 minutes repeat this hot cold and hot and cold therapy 2 or 3 times every day.  Maintain the air fracture boot for the next 2 weeks following the injection it was delivered today to the right heel. May remove the boot for driving purposes only otherwise wear the boot at all times for walking or standing can remove for driving and for sleeping.

## 2014-01-17 NOTE — Progress Notes (Signed)
   Subjective:    Patient ID: Charles Nash, male    DOB: 1965-08-04, 48 y.o.   MRN: [id number]  HPI Comments: Pt states his foot has not had any improvement and is still having problems with his back as well.  Pt ask for MRI results.  Foot Pain      Review of Systems no new findings or systemic changes noted     Objective:   Physical Exam Neurovascular status is intact pedal pulses palpable continues to have pain tenderness on the posterior lateral aspect of the heel. Patient has been using the [facility] have been the boot for a short time the temporary relief the MRI report reveals no rupture or tear the tendon no fracture the calcaneus couple small ganglion old medial to the calcaneus on the right heel also in the sinus tarsi lateral to the talonavicular joint over these are non-areas of symptoms or complaint. Patient does have pain on palpation the lateral calcaneal tubercle lateral posterior calcaneus just distal and lateral to the insertion of the Achilles tendon may be a small bursal sac or fibrous tissue and inflammation of the insertion site. X-rays do demonstrate a small osteophyte within the tendon. No tearing no fractures no cysts no other osseous abnormalities noted. Remainder of exam unremarkable unchanged and noncontributory      Assessment & Plan:  Assessment mild Achilles tendinitis insertional cannot rule out some calcaneal bursitis and lateral posterior calcaneal area with some slight tenderness on palpation area injection of bursal sac lateral calcaneal bursal sac and lateral Achilles tendon insertion site posterior lateral calcaneus. Patient is advised to return in resume wearing the air fracture boot for another 2 week duration to protect the tendon and prevent any tearing or trauma as the injection can soften the tendon patient is advised that in warm compress ice pack application and continue with [facility] on an as-needed basis recheck in one month for followup and  reevaluation consider possible additional injections and conservative care is needed maybe consider physical therapy physical [facility] to Help Resolve Some of the Inflammation If No Pertinent with the Injection  Harriet Masson DPM

## 2014-01-18 ENCOUNTER — Other Ambulatory Visit: Payer: Self-pay | Admitting: Family

## 2014-01-18 ENCOUNTER — Telehealth: Payer: Self-pay | Admitting: *Deleted

## 2014-01-18 MED ORDER — FUROSEMIDE 20 MG PO TABS: 20.0000 mg | ORAL_TABLET | Freq: Every day | ORAL | Status: DC

## 2014-01-18 NOTE — Telephone Encounter (Signed)
Pt walked in to the office requesting refill of furosemide be sent to [facility] now as he is wanting to pick it up today.  Advised front office staff that I am sending refill but please remind pt that he should call for refill in advance instead of walking in to request it now as I will not always be able to stop what I am doing to send refill immediately.

## 2014-01-19 ENCOUNTER — Encounter: Payer: Self-pay | Admitting: Medical

## 2014-01-19 ENCOUNTER — Ambulatory Visit (INDEPENDENT_AMBULATORY_CARE_PROVIDER_SITE_OTHER): Admitting: Medical

## 2014-01-19 VITALS — BP 151/82 | HR 93 | Temp 97.6°F | Ht 72.5 in | Wt 329.2 lb

## 2014-01-19 DIAGNOSIS — T7840XA Allergy, unspecified, initial encounter: Secondary | ICD-10-CM | POA: Insufficient documentation

## 2014-01-19 DIAGNOSIS — L509 Urticaria, unspecified: Secondary | ICD-10-CM

## 2014-01-19 DIAGNOSIS — T7840XD Allergy, unspecified, subsequent encounter: Secondary | ICD-10-CM

## 2014-01-19 MED ORDER — HYDROXYZINE HCL 25 MG PO TABS: 25.0000 mg | ORAL_TABLET | Freq: Three times a day (TID) | ORAL | Status: DC | PRN

## 2014-01-19 MED ORDER — DIPHENHYDRAMINE HCL 50 MG/ML IJ SOLN
50.0000 mg | Freq: Once | INTRAMUSCULAR | Status: AC
  Administered 2014-01-19: 50 mg via INTRAMUSCULAR

## 2014-01-19 NOTE — Progress Notes (Signed)
Subjective:    Patient ID: Charles Nash, male    DOB: [birth year], 48 y.o.   MRN: [id number]  HPI  Pt in with rash/hives on his back and legs. Some outbreak on his hands and arms. These are do itch and burn. Pt sees [name] NP and pt was seen by allergist in past as well. Treatment for this is usually  benadryl IM. Pt states the other day he went to [place] location and he states he was given steroid injection on tuesday. He states was told to take oral prednisone 10 mg q day for 10 days. He states got better little bit  but then last night 1 am he started to itch. Pt also takes benadryl on tablet before he went to sleep. No sob, chest pain, wheezing or feeling weak/fatigued. No swelling of lips or throat.  Pt saw allergist 9 months ago. He had rash on and off for 3 yrs. His allergist states likely part of his seasonal allergies. Allergy testing done in the past and no cause found.  Past Medical History  Diagnosis Date  . Hyperlipidemia   . Diabetes mellitus   . Hypertension   . OSA (obstructive sleep apnea)   . Arthritis   . Allergy   . Personal history of colonic polyps   . GERD (gastroesophageal reflux disease)   . Fatty liver 08/06/2011  . Bilateral varicoceles   . Plantar fasciitis   . ADHD (attention deficit hyperactivity disorder)   . PTSD (post-traumatic stress disorder)     History   Social History  . Marital Status: Married    Spouse Name: N/A    Number of Children: 2  . Years of Education: N/A   Occupational History  . FOOTBALL COACH    Social History Main Topics  . Smoking status: Never Smoker   . Smokeless tobacco: Never Used  . Alcohol Use: No     Comment: occasional wine  . Drug Use: No  . Sexual Activity: Not on file   Other Topics Concern  . Not on file   Social History Narrative   Regular exercise:  No   Caffeine use:  2--32oz cups daily          Past Surgical History  Procedure Laterality Date  . Knee surgery  08/04/08    Right  knee-- medial meniscus repair, attenuation anterior cruciate [name] MD [facility])   . Ankle surgery    . Foot surgery    . Shoulder surgery    . Varicocele excision    . Plantar fascia release      Family History  Problem Relation Age of Onset  . Diabetes Mother   . Hypertension Mother   . Arthritis Mother   . Cancer Mother     uterine and breast cancer  . Hyperlipidemia Father   . Arthritis Father   . Cancer Father     prostate cancer  . Hyperlipidemia Maternal Grandmother   . Hypertension Maternal Grandmother   . Hyperlipidemia Maternal Grandfather   . Hypertension Maternal Grandfather   . Hyperlipidemia Paternal Grandmother   . Hypertension Paternal Grandmother   . Hyperlipidemia Paternal Grandfather   . Hypertension Paternal Grandfather   . Heart attack Paternal Grandfather   . Sudden death Neg Hx   . Colon cancer Neg Hx   . Esophageal cancer Neg Hx   . Stomach cancer Neg Hx   . Rectal cancer Neg Hx     Allergies  Allergen Reactions  .  Oxycodone-Acetaminophen Hives and Itching    Current Outpatient Prescriptions on File Prior to Visit  Medication Sig Dispense Refill  . aspirin 81 MG tablet Take 1 tablet (81 mg total) by mouth daily.  100 tablet  3  . Blood Pressure Monitoring (SPHYGMOMANOMETER) MISC Use as instructed to check blood pressure. Dx 401.9  1 each  0  . cetirizine (ZYRTEC) 10 MG tablet Take 1 tablet (10 mg total) by mouth daily.  30 tablet  11  . diphenhydrAMINE (BENADRYL) 25 mg capsule Take 1 capsule (25 mg total) by mouth every 6 (six) hours as needed for itching.  60 capsule  5  . escitalopram (LEXAPRO) 10 MG tablet Take 10 mg by mouth daily.      . Eszopiclone 3 MG TABS Take 1 tablet (3 mg total) by mouth at bedtime. Take immediately before bedtime  30 tablet  0  . furosemide (LASIX) 20 MG tablet Take 1 tablet (20 mg total) by mouth daily.  30 tablet  2  . glucose blood (FREESTYLE LITE) test strip Use as instructed to check blood  sugar once a day.  DX 250.00  100 each  1  . hydrALAZINE (APRESOLINE) 50 MG tablet TAKE 1 TABLET TWICE A DAY  180 tablet  1  . hydrocortisone 2.5 % lotion Apply topically 2 (two) times daily.  59 mL  0  . JANUVIA 100 MG tablet TAKE 1 TABLET DAILY  90 tablet  1  . Lancets (FREESTYLE) lancets Use to check blood sugar once a day.  DX 250.00  100 each  1  . metoprolol succinate (TOPROL-XL) 100 MG 24 hr tablet TAKE 1 TABLET DAILY WITH OR IMMEDIATELY FOLLOWING A MEAL  90 tablet  1  . Multiple Vitamin (MULTIVITAMIN) capsule Take 1 capsule by mouth daily.  30 capsule  11  . niacin (SLO-NIACIN) 500 MG tablet Take 1 tablet (500 mg total) by mouth at bedtime.  30 tablet  5  . Omega-3 Fatty Acids (FISH OIL) 1000 MG CAPS Take 2 capsules (2,000 mg total) by mouth 2 (two) times daily.    0  . omeprazole (PRILOSEC) 20 MG capsule TAKE 2 CAPSULES EVERY MORNING  180 capsule  0  . potassium chloride (K-DUR,KLOR-CON) 10 MEQ tablet TAKE 1 TABLET (10 MEQ TOTAL) BY MOUTH DAILY.  30 tablet  3  . predniSONE (DELTASONE) 10 MG tablet Take 1 tablet (10 mg total) by mouth daily with breakfast.  14 tablet  0  . sildenafil (VIAGRA) 100 MG tablet Take 1/2 to 1 tablet daily as needed  15 tablet  0  . TESTOSTERONE CYPIONATE IM Inject into the muscle. INJECT 1 ML EVERY OTHER WEEK. (pt unsure of strength)      . TRIBENZOR 40-10-25 MG TABS TAKE 1 TABLET DAILY  90 tablet  1  . zolpidem (AMBIEN) 10 MG tablet Take 10 mg by mouth at bedtime as needed.      . Blood Glucose Monitoring Suppl (FREESTYLE LITE) DEVI Use to check blood sugar once a day.  DX 250.00  1 each  0  . meloxicam (MOBIC) 15 MG tablet Take 1 tablet (15 mg total) by mouth daily.  30 tablet  1  . methylphenidate (CONCERTA) 27 MG CR tablet Take 27 mg by mouth every morning.      . [DISCONTINUED] potassium chloride (K-DUR) 10 MEQ tablet Take 1 tablet (10 mEq total) by mouth daily.  30 tablet  1   Current Facility-Administered Medications on File Prior to Visit  Medication  Dose Route Frequency Provider Last Rate Last Dose  . triamcinolone acetonide (KENALOG) 10 MG/ML injection 10 mg  10 mg Other Once [employer], DPM        BP 151/82  Pulse 93  Temp(Src) 97.6 F (36.4 C)  Ht 6' 0.5" (1.842 m)  Wt 329 lb 3.2 oz (149.324 kg)  BMI 44.01 kg/m2  SpO2 97%       Review of Systems  Constitutional: Negative for fever, chills and fatigue.  HENT: Negative for congestion, ear pain, facial swelling, mouth sores, nosebleeds, postnasal drip, rhinorrhea, sinus pressure, sneezing, sore throat and trouble swallowing.   Respiratory: Negative for cough, choking, chest tightness, shortness of breath and wheezing.   Cardiovascular: Negative for chest pain, palpitations and leg swelling.  Gastrointestinal: Negative.   Musculoskeletal: Negative.   Skin: Positive for rash.       Faint mild hive appearance back, arms and legs.  Neurological: Negative for dizziness, tremors, seizures, syncope, facial asymmetry, speech difficulty, weakness, light-headedness, numbness and headaches.  Hematological: Negative for adenopathy. Does not bruise/bleed easily.  Psychiatric/Behavioral: Negative.        Objective:   Physical Exam  General- NAD, Pleasant pt. Neck- from, no jvd. Lungs- clear, even and unlabored. Heart- RRR Neurologic- CN III- XII grossly intact. HEENT- normal and no swelling of buccal mucosa. Normal appearing airway. Skin1-2 hyperperpigmented  hive back upper thorax,  2  Mild hyperpigmented hive on lt forarm. 1 hive  on rt forearm. 1 hive on rt calf lateral aspect.        Assessment & Plan:

## 2014-01-19 NOTE — Assessment & Plan Note (Signed)
Presentation appears to be in line with his chronic rash and prior hx. Good o2 sat and not in any distress. Did give him benadryl 50 mg im. Continue low dose prednisone. I gave rx of hydroxyzine. Advise [place] over next 7-10 days in place of oral benadry or other otc antihistamines. Follow up in 7 days or as needed.

## 2014-01-19 NOTE — Patient Instructions (Signed)
You have history of allergies with breakout of rash and very mild hives presently. This is a chronic recurrent problem for you and we gave you benadryl 50 mg im, and I gave your prescription of hydroxyzine for itching. While on hydroxyzine. Do not use benadryl,zyrtec or other antihistamines. Continue you low dose prednisone over next 7 days. If you get worse rash with respiratory symptoms then ED evaluation.

## 2014-01-26 ENCOUNTER — Other Ambulatory Visit: Payer: Self-pay | Admitting: Family

## 2014-02-06 ENCOUNTER — Encounter: Payer: Self-pay | Admitting: Family Medicine

## 2014-02-06 ENCOUNTER — Other Ambulatory Visit: Payer: Self-pay | Admitting: Family

## 2014-02-06 ENCOUNTER — Ambulatory Visit (INDEPENDENT_AMBULATORY_CARE_PROVIDER_SITE_OTHER): Admitting: Family Medicine

## 2014-02-06 VITALS — BP 135/83 | HR 84 | Ht 73.0 in | Wt 300.0 lb

## 2014-02-06 DIAGNOSIS — M1711 Unilateral primary osteoarthritis, right knee: Secondary | ICD-10-CM | POA: Insufficient documentation

## 2014-02-06 MED ORDER — SODIUM HYALURONATE (VISCOSUP) 25 MG/2.5ML IX SOSY
2.5000 mL | PREFILLED_SYRINGE | Freq: Once | INTRA_ARTICULAR | Status: AC
  Administered 2014-02-06: 2.5 mL via INTRA_ARTICULAR

## 2014-02-06 NOTE — Progress Notes (Signed)
Patient ID: Charles Nash, male   DOB: [birth year], 48 y.o.   MRN: [id number]  48 yo M here for f/u right knee pain, DJD.   01/17/11:  Patient with history of two knee arthroscopies for meniscal debridements.  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  Some swelling.  Has grinding and catching anterior and medial knee.  Has iced knee but not taking any pain medicine.  Has h/o cortisone injections in past but none in past year or so.   10/19:  Patient did not have pain improvement with cortisone injection.  Was examined in training room at school and he would like to move forward with synvisc injections for his DJD of right knee.  No change in symptoms from last visit.   10/26:  Patient did well with first synvisc injection -- here today for second one.  Had good pain relief even after first injection though some soreness now.   02/14/11:  Overall doing well - actually having superficial knee pain over patellar tendon and kneecap. No joint line pain now.  Here for third synvisc injection.   02/06/14: Patient reports his right knee pain has worsened over past couple months and very bad past week. Increased swelling. Pain mainly medial. No catching, locking, giving out. Did well with supartz series and had approval to do this again.  Past Medical History  Diagnosis Date  . Hyperlipidemia   . Diabetes mellitus   . Hypertension   . OSA (obstructive sleep apnea)   . Arthritis   . Allergy   . Personal history of colonic polyps   . GERD (gastroesophageal reflux disease)   . Fatty liver 08/06/2011  . Bilateral varicoceles   . Plantar fasciitis   . ADHD (attention deficit hyperactivity disorder)   . PTSD (post-traumatic stress disorder)     Current Outpatient Prescriptions on File Prior to Visit   Medication Sig Dispense Refill  . aspirin 81 MG tablet Take 1 tablet (81 mg total) by mouth daily.  100 tablet  3  . Blood Glucose Monitoring Suppl (FREESTYLE LITE) DEVI Use to check blood sugar once a day.  DX 250.00  1 each  0  . Blood Pressure Monitoring (SPHYGMOMANOMETER) MISC Use as instructed to check blood pressure. Dx 401.9  1 each  0  . cetirizine (ZYRTEC) 10 MG tablet Take 1 tablet (10 mg total) by mouth daily.  30 tablet  11  . diphenhydrAMINE (BENADRYL) 25 mg capsule Take 1 capsule (25 mg total) by mouth every 6 (six) hours as needed for itching.  60 capsule  5  . escitalopram (LEXAPRO) 10 MG tablet Take 10 mg by mouth daily.      . Eszopiclone 3 MG TABS Take 1 tablet (3 mg total) by mouth at bedtime. Take immediately before bedtime  30 tablet  0  . furosemide (LASIX) 20 MG tablet Take 1 tablet (20 mg total) by mouth daily.  30 tablet  2  . glucose blood (FREESTYLE LITE) test strip Use as instructed to check blood sugar once a day.  DX 250.00  100 each  1  . hydrALAZINE (APRESOLINE) 50 MG tablet TAKE 1 TABLET TWICE A DAY  180 tablet  1  . hydrocortisone 2.5 % lotion Apply topically 2 (two) times daily.  59 mL  0  .  hydrOXYzine (ATARAX/VISTARIL) 25 MG tablet Take 1 tablet (25 mg total) by mouth every 8 (eight) hours as needed for itching.  30 tablet  0  . JANUVIA 100 MG tablet TAKE 1 TABLET DAILY  90 tablet  1  . Lancets (FREESTYLE) lancets Use to check blood sugar once a day.  DX 250.00  100 each  1  . meloxicam (MOBIC) 15 MG tablet Take 1 tablet (15 mg total) by mouth daily.  30 tablet  1  . methylphenidate (CONCERTA) 27 MG CR tablet Take 27 mg by mouth every morning.      . metoprolol succinate (TOPROL-XL) 100 MG 24 hr tablet TAKE 1 TABLET DAILY WITH OR IMMEDIATELY FOLLOWING A MEAL  90 tablet  1  . Multiple Vitamin (MULTIVITAMIN) capsule Take 1 capsule by mouth daily.  30 capsule  11  . niacin (SLO-NIACIN) 500 MG tablet Take 1 tablet (500 mg total) by mouth at bedtime.  30 tablet   5  . Omega-3 Fatty Acids (FISH OIL) 1000 MG CAPS Take 2 capsules (2,000 mg total) by mouth 2 (two) times daily.    0  . omeprazole (PRILOSEC) 20 MG capsule TAKE 2 CAPSULES EVERY MORNING  180 capsule  1  . potassium chloride (K-DUR,KLOR-CON) 10 MEQ tablet TAKE 1 TABLET (10 MEQ TOTAL) BY MOUTH DAILY.  30 tablet  3  . predniSONE (DELTASONE) 10 MG tablet Take 1 tablet (10 mg total) by mouth daily with breakfast.  14 tablet  0  . sildenafil (VIAGRA) 100 MG tablet Take 1/2 to 1 tablet daily as needed  15 tablet  0  . TESTOSTERONE CYPIONATE IM Inject into the muscle. INJECT 1 ML EVERY OTHER WEEK. (pt unsure of strength)      . TRIBENZOR 40-10-25 MG TABS TAKE 1 TABLET DAILY  90 tablet  1  . zolpidem (AMBIEN) 10 MG tablet Take 10 mg by mouth at bedtime as needed.      . [DISCONTINUED] potassium chloride (K-DUR) 10 MEQ tablet Take 1 tablet (10 mEq total) by mouth daily.  30 tablet  1   Current Facility-Administered Medications on File Prior to Visit  Medication Dose Route Frequency Provider Last Rate Last Dose  . triamcinolone acetonide (KENALOG) 10 MG/ML injection 10 mg  10 mg Other Once Harriet Masson, DPM        Past Surgical History  Procedure Laterality Date  . Knee surgery  08/04/08    Right knee-- medial meniscus repair, attenuation anterior cruciate [name] MD [facility])   . Ankle surgery    . Foot surgery    . Shoulder surgery    . Varicocele excision    . Plantar fascia release      Allergies  Allergen Reactions  . Oxycodone-Acetaminophen Hives and Itching    History   Social History  . Marital Status: Married    Spouse Name: N/A    Number of Children: 2  . Years of Education: N/A   Occupational History  . FOOTBALL COACH    Social History Main Topics  . Smoking status: Never Smoker   . Smokeless tobacco: Never Used  . Alcohol Use: No     Comment: occasional wine  . Drug Use: No  . Sexual Activity: Not on file   Other Topics Concern  . Not on file    Social History Narrative   Regular exercise:  No   Caffeine use:  2--32oz cups daily          Family History  Problem Relation Age of Onset  . Diabetes Mother   . Hypertension Mother   . Arthritis Mother   . Cancer Mother     uterine and breast cancer  . Hyperlipidemia Father   . Arthritis Father   . Cancer Father     prostate cancer  . Hyperlipidemia Maternal Grandmother   . Hypertension Maternal Grandmother   . Hyperlipidemia Maternal Grandfather   . Hypertension Maternal Grandfather   . Hyperlipidemia Paternal Grandmother   . Hypertension Paternal Grandmother   . Hyperlipidemia Paternal Grandfather   . Hypertension Paternal Grandfather   . Heart attack Paternal Grandfather   . Sudden death Neg Hx   . Colon cancer Neg Hx   . Esophageal cancer Neg Hx   . Stomach cancer Neg Hx   . Rectal cancer Neg Hx     BP 135/83  Pulse 84  Ht 6\' 1"  (1.854 m)  Wt 300 lb (136.079 kg)  BMI 39.59 kg/m2  Review of Systems: See HPI above.    Objective:  Physical Exam:  Gen: NAD   R knee:  1+ synovitis - healed arthroscopy scars anterior knee. No other deformity, ecchymoses. 2+ crepitation. Mod effuson. Mod medial joint line tenderness.  No other tenderness. ROM 0-110 degrees.  Negative ant/post drawers. Negative valgus/varus testing. Negative lachmanns.  Negative mcmurrays, apleys.  NV intact distally.     Assessment & Plan:   1. Right knee pain - 2/2 moderate DJD of right knee. Went ahead with supartz series again (last done over 2 years ago and did well).    After informed written consent, patient was lying supine on exam table. Right knee was prepped with alcohol swab and utilizing superolateral approach with ultrasound guidance, patient's right knee was injected intraarticularly with 3 mL marcaine followed by supartz. Patient tolerated the procedure well without immediate complications.

## 2014-02-06 NOTE — Assessment & Plan Note (Signed)
Went ahead with supartz series again (last done over 2 years ago and did well).    After informed written consent, patient was lying supine on exam table. Right knee was prepped with alcohol swab and utilizing superolateral approach with ultrasound guidance, patient's right knee was injected intraarticularly with 3 mL marcaine followed by supartz. Patient tolerated the procedure well without immediate complications.

## 2014-02-06 NOTE — Addendum Note (Signed)
Addended by: Sherrie George F on: 02/06/2014 04:09 PM   Modules accepted: Orders

## 2014-02-13 ENCOUNTER — Ambulatory Visit: Admitting: Family Medicine

## 2014-02-13 ENCOUNTER — Ambulatory Visit (INDEPENDENT_AMBULATORY_CARE_PROVIDER_SITE_OTHER): Admitting: Family Medicine

## 2014-02-13 ENCOUNTER — Encounter: Payer: Self-pay | Admitting: Family Medicine

## 2014-02-13 VITALS — BP 137/82 | HR 102 | Ht 73.0 in | Wt 300.0 lb

## 2014-02-13 DIAGNOSIS — M1711 Unilateral primary osteoarthritis, right knee: Secondary | ICD-10-CM

## 2014-02-13 MED ORDER — SODIUM HYALURONATE (VISCOSUP) 25 MG/2.5ML IX SOSY
2.5000 mL | PREFILLED_SYRINGE | Freq: Once | INTRA_ARTICULAR | Status: AC
  Administered 2014-02-13: 2.5 mL via INTRA_ARTICULAR

## 2014-02-14 ENCOUNTER — Ambulatory Visit

## 2014-02-15 NOTE — Assessment & Plan Note (Signed)
2/2 moderate DJD of right knee. Went ahead with second supartz injection.    After informed written consent, patient was lying supine on exam table. Right knee was prepped with alcohol swab and utilizing superolateral approach with ultrasound guidance, patient's right knee was injected intraarticularly with 3 mL marcaine followed by supartz. Patient tolerated the procedure well without immediate complications.

## 2014-02-15 NOTE — Progress Notes (Signed)
Patient ID: Charles Nash, male   DOB: April 26, 1965, 48 y.o.   MRN: [id number]  48 y.o. M here for f/u right knee pain, DJD.   01/17/11:  Patient with history of two knee arthroscopies for meniscal debridements.  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  Some swelling.  Has grinding and catching anterior and medial knee.  Has iced knee but not taking any pain medicine.  Has h/o cortisone injections in past but none in past year or so.   10/19:  Patient did not have pain improvement with cortisone injection.  Was examined in training room at school and he would like to move forward with synvisc injections for his DJD of right knee.  No change in symptoms from last visit.   10/26:  Patient did well with first synvisc injection -- here today for second one.  Had good pain relief even after first injection though some soreness now.   02/14/11:  Overall doing well - actually having superficial knee pain over patellar tendon and kneecap. No joint line pain now.  Here for third synvisc injection.   02/06/14: Patient reports his right knee pain has worsened over past couple months and very bad past week. Increased swelling. Pain mainly medial. No catching, locking, giving out. Did well with supartz series and had approval to do this again.  02/13/14: Patient returns for second supartz injection. Has had some improvement.  Past Medical History  Diagnosis Date  . Hyperlipidemia   . Diabetes mellitus   . Hypertension   . OSA (obstructive sleep apnea)   . Arthritis   . Allergy   . Personal history of colonic polyps   . GERD (gastroesophageal reflux disease)   . Fatty liver 08/06/2011  . Bilateral varicoceles   . Plantar fasciitis   . ADHD (attention deficit hyperactivity disorder)   . PTSD (post-traumatic  stress disorder)     Current Outpatient Prescriptions on File Prior to Visit  Medication Sig Dispense Refill  . aspirin 81 MG tablet Take 1 tablet (81 mg total) by mouth daily. 100 tablet 3  . Blood Glucose Monitoring Suppl (FREESTYLE LITE) DEVI Use to check blood sugar once a day.  DX 250.00 1 each 0  . Blood Pressure Monitoring (SPHYGMOMANOMETER) MISC Use as instructed to check blood pressure. Dx 401.9 1 each 0  . cetirizine (ZYRTEC) 10 MG tablet Take 1 tablet (10 mg total) by mouth daily. 30 tablet 11  . diphenhydrAMINE (BENADRYL) 25 mg capsule Take 1 capsule (25 mg total) by mouth every 6 (six) hours as needed for itching. 60 capsule 5  . escitalopram (LEXAPRO) 10 MG tablet Take 10 mg by mouth daily.    . Eszopiclone 3 MG TABS Take 1 tablet (3 mg total) by mouth at bedtime. Take immediately before bedtime 30 tablet 0  . furosemide (LASIX) 20 MG tablet Take 1 tablet (20 mg total) by mouth daily. 30 tablet 2  . glucose blood (FREESTYLE LITE) test strip Use as instructed to check blood sugar once a day.  DX 250.00 100 each 1  . hydrALAZINE (APRESOLINE) 50 MG tablet TAKE 1 TABLET TWICE A DAY 180 tablet 1  . hydrocortisone 2.5 % lotion Apply topically 2 (two) times daily. 59 mL 0  . hydrOXYzine (ATARAX/VISTARIL) 25 MG tablet Take 1 tablet (  25 mg total) by mouth every 8 (eight) hours as needed for itching. 30 tablet 0  . JANUVIA 100 MG tablet TAKE 1 TABLET DAILY 90 tablet 1  . Lancets (FREESTYLE) lancets Use to check blood sugar once a day.  DX 250.00 100 each 1  . meloxicam (MOBIC) 15 MG tablet Take 1 tablet (15 mg total) by mouth daily. 30 tablet 1  . methylphenidate (CONCERTA) 27 MG CR tablet Take 27 mg by mouth every morning.    . metoprolol succinate (TOPROL-XL) 100 MG 24 hr tablet TAKE 1 TABLET DAILY WITH OR IMMEDIATELY FOLLOWING A MEAL 90 tablet 1  . Multiple Vitamin (MULTIVITAMIN) capsule Take 1 capsule by mouth daily. 30 capsule 11  . NIASPAN 500 MG CR tablet TAKE 1 TABLET (500 MG  TOTAL) BY MOUTH AT BEDTIME. 30 tablet 3  . Omega-3 Fatty Acids (FISH OIL) 1000 MG CAPS Take 2 capsules (2,000 mg total) by mouth 2 (two) times daily.  0  . omeprazole (PRILOSEC) 20 MG capsule TAKE 2 CAPSULES EVERY MORNING 180 capsule 1  . potassium chloride (K-DUR,KLOR-CON) 10 MEQ tablet TAKE 1 TABLET (10 MEQ TOTAL) BY MOUTH DAILY. 30 tablet 3  . predniSONE (DELTASONE) 10 MG tablet Take 1 tablet (10 mg total) by mouth daily with breakfast. 14 tablet 0  . sildenafil (VIAGRA) 100 MG tablet Take 1/2 to 1 tablet daily as needed 15 tablet 0  . TESTOSTERONE CYPIONATE IM Inject into the muscle. INJECT 1 ML EVERY OTHER WEEK. (pt unsure of strength)    . TRIBENZOR 40-10-25 MG TABS TAKE 1 TABLET DAILY 90 tablet 1  . zolpidem (AMBIEN) 10 MG tablet Take 10 mg by mouth at bedtime as needed.    . [DISCONTINUED] potassium chloride (K-DUR) 10 MEQ tablet Take 1 tablet (10 mEq total) by mouth daily. 30 tablet 1   Current Facility-Administered Medications on File Prior to Visit  Medication Dose Route Frequency Provider Last Rate Last Dose  . triamcinolone acetonide (KENALOG) 10 MG/ML injection 10 mg  10 mg Other Once Harriet Masson, DPM        Past Surgical History  Procedure Laterality Date  . Knee surgery  08/04/08    Right knee-- medial meniscus repair, attenuation anterior cruciate [name] MD [facility])   . Ankle surgery    . Foot surgery    . Shoulder surgery    . Varicocele excision    . Plantar fascia release      Allergies  Allergen Reactions  . Oxycodone-Acetaminophen Hives and Itching    History   Social History  . Marital Status: Married    Spouse Name: N/A    Number of Children: 2  . Years of Education: N/A   Occupational History  . FOOTBALL COACH    Social History Main Topics  . Smoking status: Never Smoker   . Smokeless tobacco: Never Used  . Alcohol Use: No     Comment: occasional wine  . Drug Use: No  . Sexual Activity: Not on file   Other Topics  Concern  . Not on file   Social History Narrative   Regular exercise:  No   Caffeine use:  2--32oz cups daily          Family History  Problem Relation Age of Onset  . Diabetes Mother   . Hypertension Mother   . Arthritis Mother   . Cancer Mother     uterine and breast cancer  . Hyperlipidemia Father   . Arthritis Father   .  Cancer Father     prostate cancer  . Hyperlipidemia Maternal Grandmother   . Hypertension Maternal Grandmother   . Hyperlipidemia Maternal Grandfather   . Hypertension Maternal Grandfather   . Hyperlipidemia Paternal Grandmother   . Hypertension Paternal Grandmother   . Hyperlipidemia Paternal Grandfather   . Hypertension Paternal Grandfather   . Heart attack Paternal Grandfather   . Sudden death Neg Hx   . Colon cancer Neg Hx   . Esophageal cancer Neg Hx   . Stomach cancer Neg Hx   . Rectal cancer Neg Hx     BP 137/82 mmHg  Pulse 102  Ht 6\' 1"  (1.854 m)  Wt 300 lb (136.079 kg)  BMI 39.59 kg/m2  Review of Systems: See HPI above.    Objective:  Physical Exam:  Gen: NAD  Exam not repeated today.  R knee:  1+ synovitis - healed arthroscopy scars anterior knee. No other deformity, ecchymoses. 2+ crepitation. Mod effuson. Mod medial joint line tenderness.  No other tenderness. ROM 0-110 degrees.  Negative ant/post drawers. Negative valgus/varus testing. Negative lachmanns.  Negative mcmurrays, apleys.  NV intact distally.     Assessment & Plan:   1. Right knee pain - 2/2 moderate DJD of right knee. Went ahead with second supartz injection.    After informed written consent, patient was lying supine on exam table. Right knee was prepped with alcohol swab and utilizing superolateral approach with ultrasound guidance, patient's right knee was injected intraarticularly with 3 mL marcaine followed by supartz. Patient tolerated the procedure well without immediate complications.

## 2014-02-20 ENCOUNTER — Ambulatory Visit: Admitting: Family Medicine

## 2014-02-20 ENCOUNTER — Encounter: Payer: Self-pay | Admitting: *Deleted

## 2014-02-23 ENCOUNTER — Telehealth: Payer: Self-pay | Admitting: *Deleted

## 2014-02-23 MED ORDER — NIACIN ER (ANTIHYPERLIPIDEMIC) 500 MG PO TBCR: EXTENDED_RELEASE_TABLET | ORAL | Status: DC

## 2014-02-23 MED ORDER — POTASSIUM CHLORIDE CRYS ER 10 MEQ PO TBCR: EXTENDED_RELEASE_TABLET | ORAL | Status: DC

## 2014-02-23 NOTE — Telephone Encounter (Signed)
Received fax from [facility] requesting Niaspan and Klor Con 5meQ. 90 day supply x 1 refill each medication sent to pharmacy.

## 2014-03-01 ENCOUNTER — Ambulatory Visit (INDEPENDENT_AMBULATORY_CARE_PROVIDER_SITE_OTHER): Admitting: Family

## 2014-03-01 ENCOUNTER — Encounter: Payer: Self-pay | Admitting: Family

## 2014-03-01 ENCOUNTER — Ambulatory Visit: Admitting: Family

## 2014-03-01 ENCOUNTER — Encounter: Payer: Self-pay | Admitting: Family Medicine

## 2014-03-01 ENCOUNTER — Ambulatory Visit (INDEPENDENT_AMBULATORY_CARE_PROVIDER_SITE_OTHER): Admitting: Family Medicine

## 2014-03-01 VITALS — BP 135/83 | HR 83 | Ht 73.0 in | Wt 320.0 lb

## 2014-03-01 VITALS — BP 129/71 | HR 83 | Temp 98.3°F | Resp 16 | Ht 73.0 in | Wt 325.6 lb

## 2014-03-01 DIAGNOSIS — I10 Essential (primary) hypertension: Secondary | ICD-10-CM

## 2014-03-01 DIAGNOSIS — M1711 Unilateral primary osteoarthritis, right knee: Secondary | ICD-10-CM

## 2014-03-01 DIAGNOSIS — E119 Type 2 diabetes mellitus without complications: Secondary | ICD-10-CM

## 2014-03-01 LAB — BASIC METABOLIC PANEL
BUN: 12 mg/dL (ref 6–23)
CO2: 30 mEq/L (ref 19–32)
CREATININE: 1.1 mg/dL (ref 0.4–1.5)
Calcium: 9.7 mg/dL (ref 8.4–10.5)
Chloride: 99 mEq/L (ref 96–112)
GFR: 95 mL/min (ref >60.00)
Glucose, Bld: 99 mg/dL (ref 70–99)
Potassium: 3.2 mEq/L — ABNORMAL LOW (ref 3.5–5.1)
Sodium: 140 mEq/L (ref 135–145)

## 2014-03-01 LAB — HEMOGLOBIN A1C: Hgb A1c MFr Bld: 6.6 % — ABNORMAL HIGH (ref 4.6–6.5)

## 2014-03-01 MED ORDER — FUROSEMIDE 20 MG PO TABS: ORAL_TABLET | ORAL | Status: DC

## 2014-03-01 MED ORDER — POTASSIUM CHLORIDE CRYS ER 10 MEQ PO TBCR: EXTENDED_RELEASE_TABLET | ORAL | Status: DC

## 2014-03-01 MED ORDER — ZOLPIDEM TARTRATE 10 MG PO TABS: 10.0000 mg | ORAL_TABLET | Freq: Every evening | ORAL | Status: DC | PRN

## 2014-03-01 MED ORDER — SITAGLIPTIN PHOSPHATE 100 MG PO TABS: 100.0000 mg | ORAL_TABLET | Freq: Every day | ORAL | Status: DC

## 2014-03-01 NOTE — Progress Notes (Signed)
Subjective:    Patient ID: Charles Nash, male    DOB: 28-Jun-1965, 48 y.o.   MRN: [id number]  HPI  Charles Nash is a 48 yo male for a medication refills and   [facility] wellness program. They took x-ray of spine. They aligned him. Teach healthy habits. 3 x week MWF at 9:30 am. Supposed to be completed by 07/2014. Is taking Fish oil 2 cap BID had some reflux at first.   HTN-Ran out of Lasix between 4-6 days ago. BP at home 110/58- 120/70 range   DM2- Never got meter for home not checking blood sugars.    Review of Systems  Endocrine: Negative for polydipsia, polyphagia and polyuria.   Past Medical History  Diagnosis Date  . Hyperlipidemia   . Diabetes mellitus   . Hypertension   . OSA (obstructive sleep apnea)   . Arthritis   . Allergy   . Personal history of colonic polyps   . GERD (gastroesophageal reflux disease)   . Fatty liver 08/06/2011  . Bilateral varicoceles   . Plantar fasciitis   . ADHD (attention deficit hyperactivity disorder)   . PTSD (post-traumatic stress disorder)     History   Social History  . Marital Status: Married    Spouse Name: N/A    Number of Children: 2  . Years of Education: N/A   Occupational History  . FOOTBALL COACH    Social History Main Topics  . Smoking status: Never Smoker   . Smokeless tobacco: Never Used  . Alcohol Use: No     Comment: occasional wine  . Drug Use: No  . Sexual Activity: Not on file   Other Topics Concern  . Not on file   Social History Narrative   Regular exercise:  No   Caffeine use:  2--32oz cups daily          Past Surgical History  Procedure Laterality Date  . Knee surgery  08/04/08    Right knee-- medial meniscus repair, attenuation anterior cruciate [name] MD [facility])   . Ankle surgery    . Foot surgery    . Shoulder surgery    . Varicocele excision    . Plantar fascia release      Family History  Problem Relation Age of Onset  . Diabetes Mother     . Hypertension Mother   . Arthritis Mother   . Cancer Mother     uterine and breast cancer  . Hyperlipidemia Father   . Arthritis Father   . Cancer Father     prostate cancer  . Hyperlipidemia Maternal Grandmother   . Hypertension Maternal Grandmother   . Hyperlipidemia Maternal Grandfather   . Hypertension Maternal Grandfather   . Hyperlipidemia Paternal Grandmother   . Hypertension Paternal Grandmother   . Hyperlipidemia Paternal Grandfather   . Hypertension Paternal Grandfather   . Heart attack Paternal Grandfather   . Sudden death Neg Hx   . Colon cancer Neg Hx   . Esophageal cancer Neg Hx   . Stomach cancer Neg Hx   . Rectal cancer Neg Hx     Allergies  Allergen Reactions  . Oxycodone-Acetaminophen Hives and Itching    Current Outpatient Prescriptions on File Prior to Visit  Medication Sig Dispense Refill  . aspirin 81 MG tablet Take 1 tablet (81 mg total) by mouth daily. 100 tablet 3  . Blood Glucose Monitoring Suppl (FREESTYLE LITE) DEVI Use to check blood sugar once a day.  DX 250.00 1 each 0  . Blood Pressure Monitoring (SPHYGMOMANOMETER) MISC Use as instructed to check blood pressure. Dx 401.9 1 each 0  . cetirizine (ZYRTEC) 10 MG tablet Take 1 tablet (10 mg total) by mouth daily. 30 tablet 11  . diphenhydrAMINE (BENADRYL) 25 mg capsule Take 1 capsule (25 mg total) by mouth every 6 (six) hours as needed for itching. 60 capsule 5  . escitalopram (LEXAPRO) 10 MG tablet Take 10 mg by mouth daily.    . Eszopiclone 3 MG TABS Take 1 tablet (3 mg total) by mouth at bedtime. Take immediately before bedtime 30 tablet 0  . glucose blood (FREESTYLE LITE) test strip Use as instructed to check blood sugar once a day.  DX 250.00 100 each 1  . hydrALAZINE (APRESOLINE) 50 MG tablet TAKE 1 TABLET TWICE A DAY (Patient taking differently: TAKE 1 TABLET daily) 180 tablet 1  . hydrocortisone 2.5 % lotion Apply topically 2 (two) times daily. 59 mL 0  . hydrOXYzine (ATARAX/VISTARIL) 25  MG tablet Take 1 tablet (25 mg total) by mouth every 8 (eight) hours as needed for itching. 30 tablet 0  . Lancets (FREESTYLE) lancets Use to check blood sugar once a day.  DX 250.00 100 each 1  . meloxicam (MOBIC) 15 MG tablet Take 1 tablet (15 mg total) by mouth daily. 30 tablet 1  . methylphenidate (CONCERTA) 27 MG CR tablet Take 27 mg by mouth every morning.    . metoprolol succinate (TOPROL-XL) 100 MG 24 hr tablet TAKE 1 TABLET DAILY WITH OR IMMEDIATELY FOLLOWING A MEAL 90 tablet 1  . Multiple Vitamin (MULTIVITAMIN) capsule Take 1 capsule by mouth daily. 30 capsule 11  . niacin (NIASPAN) 500 MG CR tablet TAKE 1 TABLET (500 MG TOTAL) BY MOUTH AT BEDTIME. 90 tablet 1  . Omega-3 Fatty Acids (FISH OIL) 1000 MG CAPS Take 2 capsules (2,000 mg total) by mouth 2 (two) times daily.  0  . omeprazole (PRILOSEC) 20 MG capsule TAKE 2 CAPSULES EVERY MORNING 180 capsule 1  . predniSONE (DELTASONE) 10 MG tablet Take 1 tablet (10 mg total) by mouth daily with breakfast. 14 tablet 0  . sildenafil (VIAGRA) 100 MG tablet Take 1/2 to 1 tablet daily as needed 15 tablet 0  . TESTOSTERONE CYPIONATE IM Inject into the muscle. INJECT 1 ML EVERY OTHER WEEK. (pt unsure of strength)    . TRIBENZOR 40-10-25 MG TABS TAKE 1 TABLET DAILY 90 tablet 1  . [DISCONTINUED] potassium chloride (K-DUR) 10 MEQ tablet Take 1 tablet (10 mEq total) by mouth daily. 30 tablet 1   Current Facility-Administered Medications on File Prior to Visit  Medication Dose Route Frequency Provider Last Rate Last Dose  . triamcinolone acetonide (KENALOG) 10 MG/ML injection 10 mg  10 mg Other Once Richard Sikora, DPM        BP 129/71 mmHg  Pulse 83  Temp(Src) 98.3 F (36.8 C) (Oral)  Resp 16  Ht 6\' 1"  (1.854 m)  Wt 325 lb 9.6 oz (147.691 kg)  BMI 42.97 kg/m2  SpO2 98%        Objective:   Physical Exam  Constitutional: He is oriented to person, place, and time. He appears well-developed and well-nourished. No distress.  HENT:  Head:  Normocephalic and atraumatic.  Cardiovascular: Normal rate and regular rhythm.   No murmur heard. Pulmonary/Chest: Effort normal and breath sounds normal. No respiratory distress. He has no wheezes. He has no rales. He exhibits no tenderness.  Musculoskeletal: He exhibits  no edema.  Neurological: He is alert and oriented to person, place, and time.  Psychiatric: He has a normal mood and affect. His behavior is normal. Judgment and thought content normal.          Assessment & Plan:

## 2014-03-01 NOTE — Progress Notes (Signed)
Patient ID: Charles Nash, male   DOB: 1966-03-20, 48 y.o.   MRN: [id number]  48 yo M here for f/u right knee pain, DJD.   01/17/11:  Patient with history of two knee arthroscopies for meniscal debridements.  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  Some swelling.  Has grinding and catching anterior and medial knee.  Has iced knee but not taking any pain medicine.  Has h/o cortisone injections in past but none in past year or so.   10/19:  Patient did not have pain improvement with cortisone injection.  Was examined in training room at school and he would like to move forward with synvisc injections for his DJD of right knee.  No change in symptoms from last visit.   10/26:  Patient did well with first synvisc injection -- here today for second one.  Had good pain relief even after first injection though some soreness now.   02/14/11:  Overall doing well - actually having superficial knee pain over patellar tendon and kneecap. No joint line pain now.  Here for third synvisc injection.   02/06/14: Patient reports his right knee pain has worsened over past couple months and very bad past week. Increased swelling. Pain mainly medial. No catching, locking, giving out. Did well with supartz series and had approval to do this again.  02/13/14: Patient returns for second supartz injection. Has had some improvement.  11/18: Patient returns for third supartz injection. Some improvement though pain still 6/10 level.  Past Medical History  Diagnosis Date  . Hyperlipidemia   . Diabetes mellitus   . Hypertension   . OSA (obstructive sleep apnea)   . Arthritis   . Allergy   . Personal history of colonic polyps   . GERD (gastroesophageal reflux disease)   . Fatty liver 08/06/2011  . Bilateral varicoceles    . Plantar fasciitis   . ADHD (attention deficit hyperactivity disorder)   . PTSD (post-traumatic stress disorder)     Current Outpatient Prescriptions on File Prior to Visit  Medication Sig Dispense Refill  . aspirin 81 MG tablet Take 1 tablet (81 mg total) by mouth daily. 100 tablet 3  . Blood Glucose Monitoring Suppl (FREESTYLE LITE) DEVI Use to check blood sugar once a day.  DX 250.00 1 each 0  . Blood Pressure Monitoring (SPHYGMOMANOMETER) MISC Use as instructed to check blood pressure. Dx 401.9 1 each 0  . cetirizine (ZYRTEC) 10 MG tablet Take 1 tablet (10 mg total) by mouth daily. 30 tablet 11  . diphenhydrAMINE (BENADRYL) 25 mg capsule Take 1 capsule (25 mg total) by mouth every 6 (six) hours as needed for itching. 60 capsule 5  . escitalopram (LEXAPRO) 10 MG tablet Take 10 mg by mouth daily.    . Eszopiclone 3 MG TABS Take 1 tablet (3 mg total) by mouth at bedtime. Take immediately before bedtime 30 tablet 0  . glucose blood (FREESTYLE LITE) test strip Use as instructed to check blood sugar once a day.  DX 250.00 100 each 1  . hydrALAZINE (APRESOLINE) 50 MG tablet TAKE 1 TABLET TWICE A DAY (Patient taking differently: TAKE 1 TABLET daily) 180 tablet 1  . hydrocortisone 2.5 % lotion Apply topically 2 (two) times daily. 59 mL 0  . hydrOXYzine (ATARAX/VISTARIL) 25 MG tablet  Take 1 tablet (25 mg total) by mouth every 8 (eight) hours as needed for itching. 30 tablet 0  . Lancets (FREESTYLE) lancets Use to check blood sugar once a day.  DX 250.00 100 each 1  . meloxicam (MOBIC) 15 MG tablet Take 1 tablet (15 mg total) by mouth daily. 30 tablet 1  . methylphenidate (CONCERTA) 27 MG CR tablet Take 27 mg by mouth every morning.    . metoprolol succinate (TOPROL-XL) 100 MG 24 hr tablet TAKE 1 TABLET DAILY WITH OR IMMEDIATELY FOLLOWING A MEAL 90 tablet 1  . Multiple Vitamin (MULTIVITAMIN) capsule Take 1 capsule by mouth daily. 30 capsule 11  . niacin (NIASPAN) 500 MG CR tablet TAKE 1 TABLET  (500 MG TOTAL) BY MOUTH AT BEDTIME. 90 tablet 1  . Omega-3 Fatty Acids (FISH OIL) 1000 MG CAPS Take 2 capsules (2,000 mg total) by mouth 2 (two) times daily.  0  . omeprazole (PRILOSEC) 20 MG capsule TAKE 2 CAPSULES EVERY MORNING 180 capsule 1  . predniSONE (DELTASONE) 10 MG tablet Take 1 tablet (10 mg total) by mouth daily with breakfast. 14 tablet 0  . sildenafil (VIAGRA) 100 MG tablet Take 1/2 to 1 tablet daily as needed 15 tablet 0  . TESTOSTERONE CYPIONATE IM Inject into the muscle. INJECT 1 ML EVERY OTHER WEEK. (pt unsure of strength)    . TRIBENZOR 40-10-25 MG TABS TAKE 1 TABLET DAILY 90 tablet 1  . [DISCONTINUED] potassium chloride (K-DUR) 10 MEQ tablet Take 1 tablet (10 mEq total) by mouth daily. 30 tablet 1   Current Facility-Administered Medications on File Prior to Visit  Medication Dose Route Frequency Provider Last Rate Last Dose  . triamcinolone acetonide (KENALOG) 10 MG/ML injection 10 mg  10 mg Other Once Harriet Masson, DPM        Past Surgical History  Procedure Laterality Date  . Knee surgery  08/04/08    Right knee-- medial meniscus repair, attenuation anterior cruciate [name] MD [facility])   . Ankle surgery    . Foot surgery    . Shoulder surgery    . Varicocele excision    . Plantar fascia release      Allergies  Allergen Reactions  . Oxycodone-Acetaminophen Hives and Itching    History   Social History  . Marital Status: Married    Spouse Name: N/A    Number of Children: 2  . Years of Education: N/A   Occupational History  . FOOTBALL COACH    Social History Main Topics  . Smoking status: Never Smoker   . Smokeless tobacco: Never Used  . Alcohol Use: No     Comment: occasional wine  . Drug Use: No  . Sexual Activity: Not on file   Other Topics Concern  . Not on file   Social History Narrative   Regular exercise:  No   Caffeine use:  2--32oz cups daily          Family History  Problem Relation Age of Onset  . Diabetes  Mother   . Hypertension Mother   . Arthritis Mother   . Cancer Mother     uterine and breast cancer  . Hyperlipidemia Father   . Arthritis Father   . Cancer Father     prostate cancer  . Hyperlipidemia Maternal Grandmother   . Hypertension Maternal Grandmother   . Hyperlipidemia Maternal Grandfather   . Hypertension Maternal Grandfather   . Hyperlipidemia Paternal Grandmother   . Hypertension Paternal Grandmother   .  Hyperlipidemia Paternal Grandfather   . Hypertension Paternal Grandfather   . Heart attack Paternal Grandfather   . Sudden death Neg Hx   . Colon cancer Neg Hx   . Esophageal cancer Neg Hx   . Stomach cancer Neg Hx   . Rectal cancer Neg Hx     BP 135/83 mmHg  Pulse 83  Ht 6\' 1"  (1.854 m)  Wt 320 lb (145.151 kg)  BMI 42.23 kg/m2  Review of Systems: See HPI above.    Objective:  Physical Exam:  Gen: NAD  Exam not repeated today.  R knee:  1+ synovitis - healed arthroscopy scars anterior knee. No other deformity, ecchymoses. 2+ crepitation. Mod effuson. Mod medial joint line tenderness.  No other tenderness. ROM 0-110 degrees.  Negative ant/post drawers. Negative valgus/varus testing. Negative lachmanns.  Negative mcmurrays, apleys.  NV intact distally.     Assessment & Plan:   1. Right knee pain - 2/2 moderate DJD of right knee. Went ahead with third supartz injection.    After informed written consent, patient was lying supine on exam table. Right knee was prepped with alcohol swab and utilizing superolateral approach with ultrasound guidance, patient's right knee was injected intraarticularly with 3 mL marcaine followed by supartz. Patient tolerated the procedure well without immediate complications.

## 2014-03-01 NOTE — Assessment & Plan Note (Signed)
2/2 moderate DJD of right knee. Went ahead with third supartz injection.    After informed written consent, patient was lying supine on exam table. Right knee was prepped with alcohol swab and utilizing superolateral approach with ultrasound guidance, patient's right knee was injected intraarticularly with 3 mL marcaine followed by supartz. Patient tolerated the procedure well without immediate complications.

## 2014-03-01 NOTE — Progress Notes (Signed)
   Subjective:    Patient ID: Charles Nash, male    DOB: [birth year], 48 y.o.   MRN: [id number]  HPI    Review of Systems     Objective:   Physical Exam        Assessment & Plan:

## 2014-03-01 NOTE — Patient Instructions (Signed)
Please complete your lab work prior to leaving. You may use lasix as needed for swelling. Follow up in 3 months. Sooner if problems/concerns.

## 2014-03-01 NOTE — Progress Notes (Signed)
Pre visit review using our clinic review tool, if applicable. No additional management support is needed unless otherwise documented below in the visit note. 

## 2014-03-02 ENCOUNTER — Telehealth: Payer: Self-pay | Admitting: Family

## 2014-03-02 DIAGNOSIS — E876 Hypokalemia: Secondary | ICD-10-CM

## 2014-03-02 MED ORDER — POTASSIUM CHLORIDE CRYS ER 20 MEQ PO TBCR: 20.0000 meq | EXTENDED_RELEASE_TABLET | Freq: Every day | ORAL | Status: DC

## 2014-03-02 NOTE — Telephone Encounter (Signed)
Potassium is low.  Increase kdur from 10 mEQ to 64mEQ once daily, repeat bmet in 1 week, dx hypokalemia.

## 2014-03-03 NOTE — Telephone Encounter (Signed)
Left detailed message on cell# and to call and schedule lab appt. Future order entered.

## 2014-03-04 NOTE — Assessment & Plan Note (Addendum)
We discussed checking sugars regularly.  Pt was given a freestyle blood glucose meter. Obtain A1C/Bmet. Continue [place].

## 2014-03-04 NOTE — Assessment & Plan Note (Signed)
BP stable,  Refill meds.    BP Readings from Last 3 Encounters:  03/01/14 135/83  03/01/14 129/71  02/13/14 137/82

## 2014-03-07 ENCOUNTER — Other Ambulatory Visit: Payer: Self-pay | Admitting: Family

## 2014-03-13 ENCOUNTER — Other Ambulatory Visit (INDEPENDENT_AMBULATORY_CARE_PROVIDER_SITE_OTHER)

## 2014-03-13 DIAGNOSIS — E876 Hypokalemia: Secondary | ICD-10-CM

## 2014-03-13 LAB — BASIC METABOLIC PANEL
BUN: 9 mg/dL (ref 6–23)
CALCIUM: 8.9 mg/dL (ref 8.4–10.5)
CO2: 24 mEq/L (ref 19–32)
Chloride: 100 mEq/L (ref 96–112)
Creatinine, Ser: 1 mg/dL (ref 0.4–1.5)
GFR: 108.96 mL/min (ref >60.00)
GLUCOSE: 122 mg/dL — AB (ref 70–99)
Potassium: 3.7 mEq/L (ref 3.5–5.1)
SODIUM: 134 meq/L — AB (ref 135–145)

## 2014-03-14 ENCOUNTER — Ambulatory Visit: Admitting: Family

## 2014-03-14 ENCOUNTER — Encounter: Payer: Self-pay | Admitting: Family

## 2014-04-18 ENCOUNTER — Telehealth: Payer: Self-pay | Admitting: Family

## 2014-04-18 NOTE — Telephone Encounter (Signed)
Caller name: [name], [name] Relation to pt: self  Call back number:[phone]   Reason for call:  Pt would like to discuss JANUVIA 100 MG tablet and nystatin.

## 2014-04-18 NOTE — Telephone Encounter (Signed)
Spoke with pt and he states that he has been out of Januvia > 1 month and post prandial BS 04/17/14 was 116 and FBS on 04/18/14 was 116.  Pt wants to know if he can remain off of Januvia? Pt was requesting refill of niacin instead of nystatin as indicated below. Advised pt to contact [facility] as we sent a refill of niacin on 02/23/14, #90 x 1 refill.  Pt also reports low back pain radiating down both legs since yesterday. Scheduled pt appt with Elyn Aquas, PA-C on 04/19/13 at 11:15am for further evaluation. Please advise re: Januvia?

## 2014-04-18 NOTE — Telephone Encounter (Signed)
Notified pt and he voices understanding. Advised pt to call if BS elevates.

## 2014-04-18 NOTE — Telephone Encounter (Signed)
OK to remain off of januvia with close monitoring of sugars.

## 2014-04-19 ENCOUNTER — Encounter: Payer: Self-pay | Admitting: Physician Assistant

## 2014-04-19 ENCOUNTER — Ambulatory Visit (INDEPENDENT_AMBULATORY_CARE_PROVIDER_SITE_OTHER): Admitting: Physician Assistant

## 2014-04-19 VITALS — BP 146/89 | HR 80 | Temp 97.8°F | Resp 16 | Ht 73.0 in | Wt 326.0 lb

## 2014-04-19 DIAGNOSIS — M5430 Sciatica, unspecified side: Secondary | ICD-10-CM

## 2014-04-19 MED ORDER — TRAMADOL HCL 50 MG PO TABS: 50.0000 mg | ORAL_TABLET | Freq: Three times a day (TID) | ORAL | Status: DC | PRN

## 2014-04-19 MED ORDER — METHYLPREDNISOLONE (PAK) 4 MG PO TABS: ORAL_TABLET | ORAL | Status: DC

## 2014-04-19 NOTE — Progress Notes (Signed)
Patient presents to clinic today c/o low back pain radiating down both lower extremities x 2 days.  Denies trauma or injury.  Denies saddle anesthesia, change to bowel/bladder habits or weakness of extremities.  Has not taken anything for symptoms.  Past Medical History  Diagnosis Date  . Hyperlipidemia   . Diabetes mellitus   . Hypertension   . OSA (obstructive sleep apnea)   . Arthritis   . Allergy   . Personal history of colonic polyps   . GERD (gastroesophageal reflux disease)   . Fatty liver 08/06/2011  . Bilateral varicoceles   . Plantar fasciitis   . ADHD (attention deficit hyperactivity disorder)   . PTSD (post-traumatic stress disorder)     Current Outpatient Prescriptions on File Prior to Visit  Medication Sig Dispense Refill  . aspirin 81 MG tablet Take 1 tablet (81 mg total) by mouth daily. 100 tablet 3  . Blood Glucose Monitoring Suppl (FREESTYLE LITE) DEVI Use to check blood sugar once a day.  DX 250.00 1 each 0  . Blood Pressure Monitoring (SPHYGMOMANOMETER) MISC Use as instructed to check blood pressure. Dx 401.9 1 each 0  . cetirizine (ZYRTEC) 10 MG tablet Take 1 tablet (10 mg total) by mouth daily. 30 tablet 11  . diphenhydrAMINE (BENADRYL) 25 mg capsule Take 1 capsule (25 mg total) by mouth every 6 (six) hours as needed for itching. 60 capsule 5  . escitalopram (LEXAPRO) 10 MG tablet Take 10 mg by mouth daily.    . Eszopiclone 3 MG TABS Take 1 tablet (3 mg total) by mouth at bedtime. Take immediately before bedtime 30 tablet 0  . furosemide (LASIX) 20 MG tablet One tab by mouth once daily as needed for swelling    . hydrALAZINE (APRESOLINE) 50 MG tablet TAKE 1 TABLET TWICE A DAY (Patient taking differently: TAKE 1 TABLET daily) 180 tablet 1  . hydrocortisone 2.5 % lotion Apply topically 2 (two) times daily. 59 mL 0  . hydrOXYzine (ATARAX/VISTARIL) 25 MG tablet Take 1 tablet (25 mg total) by mouth every 8 (eight) hours as needed for itching. 30 tablet 0  .  meloxicam (MOBIC) 15 MG tablet Take 1 tablet (15 mg total) by mouth daily. 30 tablet 1  . methylphenidate (CONCERTA) 27 MG CR tablet Take 27 mg by mouth every morning.    . metoprolol succinate (TOPROL-XL) 100 MG 24 hr tablet TAKE 1 TABLET DAILY WITH OR IMMEDIATELY FOLLOWING A MEAL 90 tablet 1  . Multiple Vitamin (MULTIVITAMIN) capsule Take 1 capsule by mouth daily. 30 capsule 11  . niacin (NIASPAN) 500 MG CR tablet TAKE 1 TABLET (500 MG TOTAL) BY MOUTH AT BEDTIME. 90 tablet 1  . Omega-3 Fatty Acids (FISH OIL) 1000 MG CAPS Take 2 capsules (2,000 mg total) by mouth 2 (two) times daily.  0  . omeprazole (PRILOSEC) 20 MG capsule TAKE 2 CAPSULES EVERY MORNING 180 capsule 1  . potassium chloride SA (K-DUR,KLOR-CON) 20 MEQ tablet Take 1 tablet (20 mEq total) by mouth daily. 30 tablet 3  . sildenafil (VIAGRA) 100 MG tablet Take 1/2 to 1 tablet daily as needed 15 tablet 0  . TESTOSTERONE CYPIONATE IM Inject into the muscle. INJECT 1 ML EVERY OTHER WEEK. (pt unsure of strength)    . TRIBENZOR 40-10-25 MG TABS TAKE 1 TABLET DAILY 90 tablet 1  . [DISCONTINUED] potassium chloride (K-DUR) 10 MEQ tablet Take 1 tablet (10 mEq total) by mouth daily. 30 tablet 1   Current Facility-Administered Medications on File  Prior to Visit  Medication Dose Route Frequency Provider Last Rate Last Dose  . triamcinolone acetonide (KENALOG) 10 MG/ML injection 10 mg  10 mg Other Once Harriet Masson, DPM        Allergies  Allergen Reactions  . Oxycodone-Acetaminophen Hives and Itching    Family History  Problem Relation Age of Onset  . Diabetes Mother   . Hypertension Mother   . Arthritis Mother   . Cancer Mother     uterine and breast cancer  . Hyperlipidemia Father   . Arthritis Father   . Cancer Father     prostate cancer  . Hyperlipidemia Maternal Grandmother   . Hypertension Maternal Grandmother   . Hyperlipidemia Maternal Grandfather   . Hypertension Maternal Grandfather   . Hyperlipidemia Paternal  Grandmother   . Hypertension Paternal Grandmother   . Hyperlipidemia Paternal Grandfather   . Hypertension Paternal Grandfather   . Heart attack Paternal Grandfather   . Sudden death Neg Hx   . Colon cancer Neg Hx   . Esophageal cancer Neg Hx   . Stomach cancer Neg Hx   . Rectal cancer Neg Hx     History   Social History  . Marital Status: Married    Spouse Name: N/A    Number of Children: 2  . Years of Education: N/A   Occupational History  . FOOTBALL COACH    Social History Main Topics  . Smoking status: Never Smoker   . Smokeless tobacco: Never Used  . Alcohol Use: No     Comment: occasional wine  . Drug Use: No  . Sexual Activity: None   Other Topics Concern  . None   Social History Narrative   Regular exercise:  No   Caffeine use:  2--32oz cups daily          Review of Systems - See HPI.  All other ROS are negative.  BP 146/89 mmHg  Pulse 80  Temp(Src) 97.8 F (36.6 C) (Oral)  Resp 16  Ht 6\' 1"  (1.854 m)  Wt 326 lb (147.873 kg)  BMI 43.02 kg/m2  SpO2 97%  Physical Exam  Constitutional: He is oriented to person, place, and time and well-developed, well-nourished, and in no distress.  HENT:  Head: Normocephalic and atraumatic.  Eyes: Conjunctivae are normal.  Cardiovascular: Normal rate, regular rhythm, normal heart sounds and intact distal pulses.   Pulmonary/Chest: Effort normal and breath sounds normal. No respiratory distress. He has no wheezes. He has no rales. He exhibits no tenderness.  Musculoskeletal:       Lumbar back: He exhibits pain. He exhibits no tenderness and no bony tenderness.  Neurological: He is alert and oriented to person, place, and time.  Skin: Skin is warm and dry. No rash noted.  Vitals reviewed.   Recent Results (from the past 2160 hour(s))  Hemoglobin A1c     Status: Abnormal   Collection Time: 03/01/14  1:42 PM  Result Value Ref Range   Hgb A1c MFr Bld 6.6 (H) 4.6 - 6.5 %    Comment: Glycemic Control Guidelines  for People with Diabetes:Non Diabetic:  <6%Goal of Therapy: <7%Additional Action Suggested:  >9%   Basic metabolic panel     Status: Abnormal   Collection Time: 03/01/14  1:42 PM  Result Value Ref Range   Sodium 140 135 - 145 mEq/L   Potassium 3.2 (L) 3.5 - 5.1 mEq/L   Chloride 99 96 - 112 mEq/L   CO2 30 19 - 32 mEq/L  Glucose, Bld 99 70 - 99 mg/dL   BUN 12 6 - 23 mg/dL   Creatinine, Ser 1.1 0.4 - 1.5 mg/dL   Calcium 9.7 8.4 - 10.5 mg/dL   GFR 95.00 >60.00 mL/min  Basic metabolic panel     Status: Abnormal   Collection Time: 03/13/14  8:41 AM  Result Value Ref Range   Sodium 134 (L) 135 - 145 mEq/L   Potassium 3.7 3.5 - 5.1 mEq/L   Chloride 100 96 - 112 mEq/L   CO2 24 19 - 32 mEq/L   Glucose, Bld 122 (H) 70 - 99 mg/dL   BUN 9 6 - 23 mg/dL   Creatinine, Ser 1.0 0.4 - 1.5 mg/dL   Calcium 8.9 8.4 - 10.5 mg/dL   GFR 108.96 >60.00 mL/min    Assessment/Plan: Sciatica Rx Tramadol for pain. Rx Medrol dose pack to calm nerve irritation Avoid heavy lifting or overexertion. Topical Aspercreme.  Follow-up if symptoms are not resolving -- will need imaging.

## 2014-04-19 NOTE — Patient Instructions (Signed)
Please take Steroid pack as directed.  Use Tramadol only if needed for severe pain.  Avoid heavy lifting.  Continue heating pad and ice.  Do the exercises listed below.  Call or return to clinic if symptoms are not improving.  Sciatica with Rehab The sciatic nerve runs from the back down the leg and is responsible for sensation and control of the muscles in the back (posterior) side of the thigh, lower leg, and foot. Sciatica is a condition that is characterized by inflammation of this nerve.  SYMPTOMS   Signs of nerve damage, including numbness and/or weakness along the posterior side of the lower extremity.  Pain in the back of the thigh that may also travel down the leg.  Pain that worsens when sitting for long periods of time.  Occasionally, pain in the back or buttock. CAUSES  Inflammation of the sciatic nerve is the cause of sciatica. The inflammation is due to something irritating the nerve. Common sources of irritation include:  Sitting for long periods of time.  Direct trauma to the nerve.  Arthritis of the spine.  Herniated or ruptured disk.  Slipping of the vertebrae (spondylolisthesis).  Pressure from soft tissues, such as muscles or ligament-like tissue (fascia). RISK INCREASES WITH:  Sports that place pressure or stress on the spine (football or weightlifting).  Poor strength and flexibility.  Failure to warm up properly before activity.  Family history of low back pain or disk disorders.  Previous back injury or surgery.  Poor body mechanics, especially when lifting, or poor posture. PREVENTION   Warm up and stretch properly before activity.  Maintain physical fitness:  Strength, flexibility, and endurance.  Cardiovascular fitness.  Learn and use proper technique, especially with posture and lifting. When possible, have coach correct improper technique.  Avoid activities that place stress on the spine. PROGNOSIS If treated properly, then sciatica  usually resolves within 6 weeks. However, occasionally surgery is necessary.  RELATED COMPLICATIONS   Permanent nerve damage, including pain, numbness, tingle, or weakness.  Chronic back pain.  Risks of surgery: infection, bleeding, nerve damage, or damage to surrounding tissues. TREATMENT Treatment initially involves resting from any activities that aggravate your symptoms. The use of ice and medication may help reduce pain and inflammation. The use of strengthening and stretching exercises may help reduce pain with activity. These exercises may be performed at home or with referral to a therapist. A therapist may recommend further treatments, such as transcutaneous electronic nerve stimulation (TENS) or ultrasound. Your caregiver may recommend corticosteroid injections to help reduce inflammation of the sciatic nerve. If symptoms persist despite non-surgical (conservative) treatment, then surgery may be recommended. MEDICATION  If pain medication is necessary, then nonsteroidal anti-inflammatory medications, such as aspirin and ibuprofen, or other minor pain relievers, such as acetaminophen, are often recommended.  Do not take pain medication for 7 days before surgery.  Prescription pain relievers may be given if deemed necessary by your caregiver. Use only as directed and only as much as you need.  Ointments applied to the skin may be helpful.  Corticosteroid injections may be given by your caregiver. These injections should be reserved for the most serious cases, because they may only be given a certain number of times. HEAT AND COLD  Cold treatment (icing) relieves pain and reduces inflammation. Cold treatment should be applied for 10 to 15 minutes every 2 to 3 hours for inflammation and pain and immediately after any activity that aggravates your symptoms. Use ice packs or massage  the area with a piece of ice (ice massage).  Heat treatment may be used prior to performing the stretching  and strengthening activities prescribed by your caregiver, physical therapist, or athletic trainer. Use a heat pack or soak the injury in warm water. SEEK MEDICAL CARE IF:  Treatment seems to offer no benefit, or the condition worsens.  Any medications produce adverse side effects. EXERCISES  RANGE OF MOTION (ROM) AND STRETCHING EXERCISES - Sciatica Most people with sciatic will find that their symptoms worsen with either excessive bending forward (flexion) or arching at the low back (extension). The exercises which will help resolve your symptoms will focus on the opposite motion. Your physician, physical therapist or athletic trainer will help you determine which exercises will be most helpful to resolve your low back pain. Do not complete any exercises without first consulting with your clinician. Discontinue any exercises which worsen your symptoms until you speak to your clinician. If you have pain, numbness or tingling which travels down into your buttocks, leg or foot, the goal of the therapy is for these symptoms to move closer to your back and eventually resolve. Occasionally, these leg symptoms will get better, but your low back pain may worsen; this is typically an indication of progress in your rehabilitation. Be certain to be very alert to any changes in your symptoms and the activities in which you participated in the 24 hours prior to the change. Sharing this information with your clinician will allow him/her to most efficiently treat your condition. These exercises may help you when beginning to rehabilitate your injury. Your symptoms may resolve with or without further involvement from your physician, physical therapist or athletic trainer. While completing these exercises, remember:   Restoring tissue flexibility helps normal motion to return to the joints. This allows healthier, less painful movement and activity.  An effective stretch should be held for at least 30 seconds.  A  stretch should never be painful. You should only feel a gentle lengthening or release in the stretched tissue. FLEXION RANGE OF MOTION AND STRETCHING EXERCISES: STRETCH - Flexion, Single Knee to Chest   Lie on a firm bed or floor with both legs extended in front of you.  Keeping one leg in contact with the floor, bring your opposite knee to your chest. Hold your leg in place by either grabbing behind your thigh or at your knee.  Pull until you feel a gentle stretch in your low back. Hold __________ seconds.  Slowly release your grasp and repeat the exercise with the opposite side. Repeat __________ times. Complete this exercise __________ times per day.  STRETCH - Flexion, Double Knee to Chest  Lie on a firm bed or floor with both legs extended in front of you.  Keeping one leg in contact with the floor, bring your opposite knee to your chest.  Tense your stomach muscles to support your back and then lift your other knee to your chest. Hold your legs in place by either grabbing behind your thighs or at your knees.  Pull both knees toward your chest until you feel a gentle stretch in your low back. Hold __________ seconds.  Tense your stomach muscles and slowly return one leg at a time to the floor. Repeat __________ times. Complete this exercise __________ times per day.  STRETCH - Low Trunk Rotation   Lie on a firm bed or floor. Keeping your legs in front of you, bend your knees so they are both pointed toward the ceiling  and your feet are flat on the floor.  Extend your arms out to the side. This will stabilize your upper body by keeping your shoulders in contact with the floor.  Gently and slowly drop both knees together to one side until you feel a gentle stretch in your low back. Hold for __________ seconds.  Tense your stomach muscles to support your low back as you bring your knees back to the starting position. Repeat the exercise to the other side. Repeat __________ times.  Complete this exercise __________ times per day  EXTENSION RANGE OF MOTION AND FLEXIBILITY EXERCISES: STRETCH - Extension, Prone on Elbows  Lie on your stomach on the floor, a bed will be too soft. Place your palms about shoulder width apart and at the height of your head.  Place your elbows under your shoulders. If this is too painful, stack pillows under your chest.  Allow your body to relax so that your hips drop lower and make contact more completely with the floor.  Hold this position for __________ seconds.  Slowly return to lying flat on the floor. Repeat __________ times. Complete this exercise __________ times per day.  RANGE OF MOTION - Extension, Prone Press Ups  Lie on your stomach on the floor, a bed will be too soft. Place your palms about shoulder width apart and at the height of your head.  Keeping your back as relaxed as possible, slowly straighten your elbows while keeping your hips on the floor. You may adjust the placement of your hands to maximize your comfort. As you gain motion, your hands will come more underneath your shoulders.  Hold this position __________ seconds.  Slowly return to lying flat on the floor. Repeat __________ times. Complete this exercise __________ times per day.  STRENGTHENING EXERCISES - Sciatica  These exercises may help you when beginning to rehabilitate your injury. These exercises should be done near your "sweet spot." This is the neutral, low-back arch, somewhere between fully rounded and fully arched, that is your least painful position. When performed in this safe range of motion, these exercises can be used for people who have either a flexion or extension based injury. These exercises may resolve your symptoms with or without further involvement from your physician, physical therapist or athletic trainer. While completing these exercises, remember:   Muscles can gain both the endurance and the strength needed for everyday activities  through controlled exercises.  Complete these exercises as instructed by your physician, physical therapist or athletic trainer. Progress with the resistance and repetition exercises only as your caregiver advises.  You may experience muscle soreness or fatigue, but the pain or discomfort you are trying to eliminate should never worsen during these exercises. If this pain does worsen, stop and make certain you are following the directions exactly. If the pain is still present after adjustments, discontinue the exercise until you can discuss the trouble with your clinician. STRENGTHENING - Deep Abdominals, Pelvic Tilt   Lie on a firm bed or floor. Keeping your legs in front of you, bend your knees so they are both pointed toward the ceiling and your feet are flat on the floor.  Tense your lower abdominal muscles to press your low back into the floor. This motion will rotate your pelvis so that your tail bone is scooping upwards rather than pointing at your feet or into the floor.  With a gentle tension and even breathing, hold this position for __________ seconds. Repeat __________ times. Complete this exercise  __________ times per day.  STRENGTHENING - Abdominals, Crunches   Lie on a firm bed or floor. Keeping your legs in front of you, bend your knees so they are both pointed toward the ceiling and your feet are flat on the floor. Cross your arms over your chest.  Slightly tip your chin down without bending your neck.  Tense your abdominals and slowly lift your trunk high enough to just clear your shoulder blades. Lifting higher can put excessive stress on the low back and does not further strengthen your abdominal muscles.  Control your return to the starting position. Repeat __________ times. Complete this exercise __________ times per day.  STRENGTHENING - Quadruped, Opposite UE/LE Lift  Assume a hands and knees position on a firm surface. Keep your hands under your shoulders and your  knees under your hips. You may place padding under your knees for comfort.  Find your neutral spine and gently tense your abdominal muscles so that you can maintain this position. Your shoulders and hips should form a rectangle that is parallel with the floor and is not twisted.  Keeping your trunk steady, lift your right hand no higher than your shoulder and then your left leg no higher than your hip. Make sure you are not holding your breath. Hold this position __________ seconds.  Continuing to keep your abdominal muscles tense and your back steady, slowly return to your starting position. Repeat with the opposite arm and leg. Repeat __________ times. Complete this exercise __________ times per day.  STRENGTHENING - Abdominals and Quadriceps, Straight Leg Raise   Lie on a firm bed or floor with both legs extended in front of you.  Keeping one leg in contact with the floor, bend the other knee so that your foot can rest flat on the floor.  Find your neutral spine, and tense your abdominal muscles to maintain your spinal position throughout the exercise.  Slowly lift your straight leg off the floor about 6 inches for a count of 15, making sure to not hold your breath.  Still keeping your neutral spine, slowly lower your leg all the way to the floor. Repeat this exercise with each leg __________ times. Complete this exercise __________ times per day. POSTURE AND BODY MECHANICS CONSIDERATIONS - Sciatica Keeping correct posture when sitting, standing or completing your activities will reduce the stress put on different body tissues, allowing injured tissues a chance to heal and limiting painful experiences. The following are general guidelines for improved posture. Your physician or physical therapist will provide you with any instructions specific to your needs. While reading these guidelines, remember:  The exercises prescribed by your provider will help you have the flexibility and strength  to maintain correct postures.  The correct posture provides the optimal environment for your joints to work. All of your joints have less wear and tear when properly supported by a spine with good posture. This means you will experience a healthier, less painful body.  Correct posture must be practiced with all of your activities, especially prolonged sitting and standing. Correct posture is as important when doing repetitive low-stress activities (typing) as it is when doing a single heavy-load activity (lifting). RESTING POSITIONS Consider which positions are most painful for you when choosing a resting position. If you have pain with flexion-based activities (sitting, bending, stooping, squatting), choose a position that allows you to rest in a less flexed posture. You would want to avoid curling into a fetal position on your side. If your  pain worsens with extension-based activities (prolonged standing, working overhead), avoid resting in an extended position such as sleeping on your stomach. Most people will find more comfort when they rest with their spine in a more neutral position, neither too rounded nor too arched. Lying on a non-sagging bed on your side with a pillow between your knees, or on your back with a pillow under your knees will often provide some relief. Keep in mind, being in any one position for a prolonged period of time, no matter how correct your posture, can still lead to stiffness. PROPER SITTING POSTURE In order to minimize stress and discomfort on your spine, you must sit with correct posture Sitting with good posture should be effortless for a healthy body. Returning to good posture is a gradual process. Many people can work toward this most comfortably by using various supports until they have the flexibility and strength to maintain this posture on their own. When sitting with proper posture, your ears will fall over your shoulders and your shoulders will fall over your  hips. You should use the back of the chair to support your upper back. Your low back will be in a neutral position, just slightly arched. You may place a small pillow or folded towel at the base of your low back for support.  When working at a desk, create an environment that supports good, upright posture. Without extra support, muscles fatigue and lead to excessive strain on joints and other tissues. Keep these recommendations in mind: CHAIR:   A chair should be able to slide under your desk when your back makes contact with the back of the chair. This allows you to work closely.  The chair's height should allow your eyes to be level with the upper part of your monitor and your hands to be slightly lower than your elbows. BODY POSITION  Your feet should make contact with the floor. If this is not possible, use a foot rest.  Keep your ears over your shoulders. This will reduce stress on your neck and low back. INCORRECT SITTING POSTURES   If you are feeling tired and unable to assume a healthy sitting posture, do not slouch or slump. This puts excessive strain on your back tissues, causing more damage and pain. Healthier options include:  Using more support, like a lumbar pillow.  Switching tasks to something that requires you to be upright or walking.  Talking a brief walk.  Lying down to rest in a neutral-spine position. PROLONGED STANDING WHILE SLIGHTLY LEANING FORWARD  When completing a task that requires you to lean forward while standing in one place for a long time, place either foot up on a stationary 2-4 inch high object to help maintain the best posture. When both feet are on the ground, the low back tends to lose its slight inward curve. If this curve flattens (or becomes too large), then the back and your other joints will experience too much stress, fatigue more quickly and can cause pain.  CORRECT STANDING POSTURES Proper standing posture should be assumed with all daily  activities, even if they only take a few moments, like when brushing your teeth. As in sitting, your ears should fall over your shoulders and your shoulders should fall over your hips. You should keep a slight tension in your abdominal muscles to brace your spine. Your tailbone should point down to the ground, not behind your body, resulting in an over-extended swayback posture.  INCORRECT STANDING POSTURES  Common  incorrect standing postures include a forward head, locked knees and/or an excessive swayback. WALKING Walk with an upright posture. Your ears, shoulders and hips should all line-up. PROLONGED ACTIVITY IN A FLEXED POSITION When completing a task that requires you to bend forward at your waist or lean over a low surface, try to find a way to stabilize 3 of 4 of your limbs. You can place a hand or elbow on your thigh or rest a knee on the surface you are reaching across. This will provide you more stability so that your muscles do not fatigue as quickly. By keeping your knees relaxed, or slightly bent, you will also reduce stress across your low back. CORRECT LIFTING TECHNIQUES DO :   Assume a wide stance. This will provide you more stability and the opportunity to get as close as possible to the object which you are lifting.  Tense your abdominals to brace your spine; then bend at the knees and hips. Keeping your back locked in a neutral-spine position, lift using your leg muscles. Lift with your legs, keeping your back straight.  Test the weight of unknown objects before attempting to lift them.  Try to keep your elbows locked down at your sides in order get the best strength from your shoulders when carrying an object.  Always ask for help when lifting heavy or awkward objects. INCORRECT LIFTING TECHNIQUES DO NOT:   Lock your knees when lifting, even if it is a small object.  Bend and twist. Pivot at your feet or move your feet when needing to change directions.  Assume that you  cannot safely pick up a paperclip without proper posture. Document Released: 03/31/2005 Document Revised: 08/15/2013 Document Reviewed: 07/13/2008 [facility] Patient Information 2015 [place], [place]. This information is not intended to replace advice given to you by your health care provider. Make sure you discuss any questions you have with your health care provider.

## 2014-04-19 NOTE — Progress Notes (Signed)
Pre visit review using our clinic review tool, if applicable. No additional management support is needed unless otherwise documented below in the visit note/SLS  

## 2014-04-21 ENCOUNTER — Telehealth: Payer: Self-pay | Admitting: *Deleted

## 2014-04-21 MED ORDER — GLUCOSE BLOOD VI STRP: ORAL_STRIP | Status: DC

## 2014-04-21 MED ORDER — ZOLPIDEM TARTRATE 10 MG PO TABS: 10.0000 mg | ORAL_TABLET | Freq: Every evening | ORAL | Status: DC | PRN

## 2014-04-21 MED ORDER — FREESTYLE LANCETS MISC: Status: DC

## 2014-04-21 NOTE — Telephone Encounter (Signed)
OK to send ambien refill.

## 2014-04-21 NOTE — Telephone Encounter (Signed)
Received fax requests from [facility] for Freestyle Lancets, test strips and ambien.  Rxs sent for lancets and test strips. Please advise re: Charles Nash request?

## 2014-04-21 NOTE — Telephone Encounter (Signed)
Rx printed and forwarded to Dr Charlett Blake for signature.

## 2014-04-22 ENCOUNTER — Other Ambulatory Visit: Payer: Self-pay | Admitting: Family

## 2014-04-22 DIAGNOSIS — M5430 Sciatica, unspecified side: Secondary | ICD-10-CM | POA: Insufficient documentation

## 2014-04-22 NOTE — Assessment & Plan Note (Signed)
Rx Tramadol for pain. Rx Medrol dose pack to calm nerve irritation Avoid heavy lifting or overexertion. Topical Aspercreme.  Follow-up if symptoms are not resolving -- will need imaging.

## 2014-04-24 NOTE — Telephone Encounter (Signed)
Rx faxed at 7:15 this morning.

## 2014-04-24 NOTE — Telephone Encounter (Signed)
Last filled:  03/30/13 Amt: 15, 0  Last OV:  1.6.16--saw Elyn Aquas, PA-C  Please advise.

## 2014-05-02 ENCOUNTER — Ambulatory Visit (HOSPITAL_BASED_OUTPATIENT_CLINIC_OR_DEPARTMENT_OTHER)
Admission: RE | Admit: 2014-05-02 | Discharge: 2014-05-02 | Disposition: A | Discharge status: Home / Self Care | Source: Ambulatory Visit | Attending: Internal Medicine | Admitting: Internal Medicine

## 2014-05-02 ENCOUNTER — Encounter: Payer: Self-pay | Admitting: Internal Medicine

## 2014-05-02 ENCOUNTER — Ambulatory Visit (INDEPENDENT_AMBULATORY_CARE_PROVIDER_SITE_OTHER): Admitting: Internal Medicine

## 2014-05-02 VITALS — BP 148/90 | HR 76 | Temp 98.0°F | Ht 73.0 in | Wt 321.4 lb

## 2014-05-02 DIAGNOSIS — I10 Essential (primary) hypertension: Secondary | ICD-10-CM

## 2014-05-02 DIAGNOSIS — R079 Chest pain, unspecified: Secondary | ICD-10-CM

## 2014-05-02 NOTE — Patient Instructions (Signed)
Get a chest x-ray at the first floor  For pain take Tylenol 500 mg 2 tablets every 6 hours as needed  Check the  blood pressure daily  Be sure your blood pressure is between 110/65 and  145/85.  if it is consistently higher or lower, let me know    Come back and see your primary doctor in 2 weeks  Call anytime if you half intense, severe or persistent chest pain. Also if you have fever, chills or a rash in the chest

## 2014-05-02 NOTE — Progress Notes (Signed)
Subjective:    Patient ID: Charles Nash, male    DOB: 10/24/1965, 49 y.o.   MRN: [id number]  DOS:  05/02/2014 Type of visit - description : acute, cc is chest pain , here w/ wife Interval history: Symptoms started yesterday with on and off sharp pain at the left anterior lateral chest. Symptoms worse if he moves  certain ways, today pain is gone but the area is sore to touch. He denies any injury or fall. Also he noted his blood pressure to be elevated, is usually 140/85, yesterday he checked several times and it was as high as 168/108. he took extra hydralazine. This morning BP was 140/90.  ROS Rash in the back? (per wife) she could not identify the rash today during the visit. Denies any cough, sputum production. No fever chills Denies shortness of breath or palpitations Has mild back pain which is at baseline No recent prolonged car trips or airplane trips. He is not taking any Motrin-like medications, actually medication list is reviewed and updated.   Past Medical History  Diagnosis Date  . Hyperlipidemia   . Diabetes mellitus   . Hypertension   . OSA (obstructive sleep apnea)   . Arthritis   . Allergy   . Personal history of colonic polyps   . GERD (gastroesophageal reflux disease)   . Fatty liver 08/06/2011  . Bilateral varicoceles   . Plantar fasciitis   . ADHD (attention deficit hyperactivity disorder)   . PTSD (post-traumatic stress disorder)     Past Surgical History  Procedure Laterality Date  . Knee surgery  08/04/08    Right knee-- medial meniscus repair, attenuation anterior cruciate [name] MD [facility])   . Ankle surgery    . Foot surgery    . Shoulder surgery    . Varicocele excision    . Plantar fascia release      History   Social History  . Marital Status: Married    Spouse Name: N/A    Number of Children: 2  . Years of Education: N/A   Occupational History  . FOOTBALL COACH    Social History Main Topics  .  Smoking status: Never Smoker   . Smokeless tobacco: Never Used  . Alcohol Use: No     Comment: occasional wine  . Drug Use: No  . Sexual Activity: Not on file   Other Topics Concern  . Not on file   Social History Narrative   Regular exercise:  No   Caffeine use:  2--32oz cups daily              Medication List       This list is accurate as of: 05/02/14  6:27 PM.  Always use your most recent med list.               aspirin 81 MG tablet  Take 1 tablet (81 mg total) by mouth daily.     cetirizine 10 MG tablet  Commonly known as:  ZYRTEC  Take 1 tablet (10 mg total) by mouth daily.     clomiPHENE 50 MG tablet  Commonly known as:  CLOMID  Take 50 mg by mouth daily.     Fish Oil 1000 MG Caps  Take 2 capsules (2,000 mg total) by mouth 2 (two) times daily.     freestyle lancets  Use to check blood sugar once a day.  DX E11.9     FREESTYLE LITE Devi  Use to check  blood sugar once a day.  DX 250.00     furosemide 20 MG tablet  Commonly known as:  LASIX  One tab by mouth once daily as needed for swelling     glucose blood test strip  Commonly known as:  FREESTYLE LITE  Use as instructed to check blood sugar once a day.  DX E11.9     hydrALAZINE 50 MG tablet  Commonly known as:  APRESOLINE  TAKE 1 TABLET TWICE A DAY     hydrocortisone 2.5 % lotion  Apply topically 2 (two) times daily.     metoprolol succinate 100 MG 24 hr tablet  Commonly known as:  TOPROL-XL  TAKE 1 TABLET DAILY WITH OR IMMEDIATELY FOLLOWING A MEAL     niacin 500 MG CR tablet  Commonly known as:  NIASPAN  TAKE 1 TABLET (500 MG TOTAL) BY MOUTH AT BEDTIME.     omeprazole 20 MG capsule  Commonly known as:  PRILOSEC  TAKE 2 CAPSULES EVERY MORNING     potassium chloride SA 20 MEQ tablet  Commonly known as:  K-DUR,KLOR-CON  Take 1 tablet (20 mEq total) by mouth daily.     sildenafil 100 MG tablet  Commonly known as:  VIAGRA  Take 1/2 to 1 tablet daily as needed     traMADol 50 MG  tablet  Commonly known as:  ULTRAM  Take 1 tablet (50 mg total) by mouth every 8 (eight) hours as needed.     TRIBENZOR 40-10-25 MG Tabs  Generic drug:  Olmesartan-Amlodipine-HCTZ  TAKE 1 TABLET DAILY     zolpidem 10 MG tablet  Commonly known as:  AMBIEN  Take 1 tablet (10 mg total) by mouth at bedtime as needed.           Objective:   Physical Exam  Musculoskeletal:       Arms:  BP 148/90 mmHg  Pulse 76  Temp(Src) 98 F (36.7 C) (Oral)  Ht 6\' 1"  (1.854 m)  Wt 321 lb 6 oz (145.775 kg)  BMI 42.41 kg/m2  SpO2 99% General -- alert, well-developed, NAD.    HEENT-- Not pale.  Chest wall and back: No rash specifically no blisters Lungs -- normal respiratory effort, no intercostal retractions, no accessory muscle use, and normal breath sounds.  Heart-- normal rate, regular rhythm, no murmur.  Abdomen-- Not distended, good bowel sounds,soft, non-tender.  Extremities-- no pretibial edema bilaterally ; calves symmetric, no TTP Neurologic--  alert & oriented X3. Speech normal, gait appropriate for age, strength symmetric and appropriate for age.    Psych-- Cognition and judgment appear intact. Cooperative with normal attention span and concentration. No anxious or depressed appearing.        Assessment & Plan:   Chest pain Chest pain is atypical for CAD, muscle skeletal? Early shingles? Very low suspicious for PE or pnm Plan: Chest x-ray, observation with Tylenol.  Hypertension, Usually under better control with ambulatory BPs around 140/85, recommend continue monitoring, consider increase hydralazine.  Follow-up with PCP 2 weeks

## 2014-05-02 NOTE — Progress Notes (Signed)
Pre visit review using our clinic review tool, if applicable. No additional management support is needed unless otherwise documented below in the visit note. 

## 2014-05-08 ENCOUNTER — Telehealth: Payer: Self-pay | Admitting: *Deleted

## 2014-05-08 NOTE — Telephone Encounter (Signed)
Received email from patient about getting an injection for his right knee. Called and LM. Patient had not returned call. Spoke with patient today (Monday) and he would like to set appointment to have injection performed on right knee. Had to go but would call back to set up appointment.

## 2014-05-09 ENCOUNTER — Ambulatory Visit (INDEPENDENT_AMBULATORY_CARE_PROVIDER_SITE_OTHER): Admitting: Family Medicine

## 2014-05-09 ENCOUNTER — Ambulatory Visit (HOSPITAL_BASED_OUTPATIENT_CLINIC_OR_DEPARTMENT_OTHER)
Admission: RE | Admit: 2014-05-09 | Discharge: 2014-05-09 | Disposition: A | Discharge status: Home / Self Care | Source: Ambulatory Visit | Attending: Family Medicine | Admitting: Family Medicine

## 2014-05-09 ENCOUNTER — Encounter: Payer: Self-pay | Admitting: Family Medicine

## 2014-05-09 VITALS — BP 127/77 | HR 86 | Ht 73.0 in | Wt 320.6 lb

## 2014-05-09 DIAGNOSIS — M25562 Pain in left knee: Secondary | ICD-10-CM | POA: Insufficient documentation

## 2014-05-09 DIAGNOSIS — M1711 Unilateral primary osteoarthritis, right knee: Secondary | ICD-10-CM

## 2014-05-11 ENCOUNTER — Ambulatory Visit (INDEPENDENT_AMBULATORY_CARE_PROVIDER_SITE_OTHER): Admitting: Family Medicine

## 2014-05-11 ENCOUNTER — Encounter: Payer: Self-pay | Admitting: Family Medicine

## 2014-05-11 VITALS — BP 189/81 | HR 75 | Ht 73.0 in | Wt 322.2 lb

## 2014-05-11 DIAGNOSIS — M25562 Pain in left knee: Secondary | ICD-10-CM | POA: Insufficient documentation

## 2014-05-11 DIAGNOSIS — M1711 Unilateral primary osteoarthritis, right knee: Secondary | ICD-10-CM

## 2014-05-11 MED ORDER — SODIUM HYALURONATE (VISCOSUP) 25 MG/2.5ML IX SOSY
2.5000 mL | PREFILLED_SYRINGE | Freq: Once | INTRA_ARTICULAR | Status: AC
  Administered 2014-05-11: 2.5 mL via INTRA_ARTICULAR

## 2014-05-11 MED ORDER — METHYLPREDNISOLONE ACETATE 40 MG/ML IJ SUSP
40.0000 mg | Freq: Once | INTRAMUSCULAR | Status: AC
  Administered 2014-05-11: 40 mg via INTRA_ARTICULAR

## 2014-05-11 NOTE — Assessment & Plan Note (Signed)
surprisingly has minimal DJD of this knee.  Possible he has more DJD than is seen on plain radiographs vs degenerative meniscus tear.  He will return for cortisone injection.  If not improving will go ahead with MRI.

## 2014-05-11 NOTE — Assessment & Plan Note (Signed)
moderate DJD of right knee. Will go ahead with approval process for 4th and 5th injections of supartz.

## 2014-05-11 NOTE — Progress Notes (Signed)
Patient ID: Charles Nash, male   DOB: 1965-10-21, 49 y.o.   MRN: [id number]  49 yo M here for bilateral knee pain   01/17/11:  Patient with history of two knee arthroscopies for meniscal debridements.  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  Some swelling.  Has grinding and catching anterior and medial knee.  Has iced knee but not taking any pain medicine.  Has h/o cortisone injections in past but none in past year or so.   10/19:  Patient did not have pain improvement with cortisone injection.  Was examined in training room at school and he would like to move forward with synvisc injections for his DJD of right knee.  No change in symptoms from last visit.   10/26:  Patient did well with first synvisc injection -- here today for second one.  Had good pain relief even after first injection though some soreness now.   02/14/11:  Overall doing well - actually having superficial knee pain over patellar tendon and kneecap. No joint line pain now.  Here for third synvisc injection.   02/06/14: Patient reports his right knee pain has worsened over past couple months and very bad past week. Increased swelling. Pain mainly medial. No catching, locking, giving out. Did well with supartz series and had approval to do this again.  02/13/14: Patient returns for second supartz injection. Has had some improvement.  11/18: Patient returns for third supartz injection. Some improvement though pain still 6/10 level.  05/09/14: Patient reports both knees bothering him - left knee hurting more now at 4/10 level - has not had injections in this knee, no prior x-rays. Right knee still 3/10 - would like to do 1 or 2 more supartz injections. Left knee feels like it is coming out of place at times, feels unstable and  may give out.  Past Medical History  Diagnosis Date  . Hyperlipidemia   . Diabetes mellitus   . Hypertension   . OSA (obstructive sleep apnea)   . Arthritis   . Allergy   . Personal history of colonic polyps   . GERD (gastroesophageal reflux disease)   . Fatty liver 08/06/2011  . Bilateral varicoceles   . Plantar fasciitis   . ADHD (attention deficit hyperactivity disorder)   . PTSD (post-traumatic stress disorder)     Current Outpatient Prescriptions on File Prior to Visit  Medication Sig Dispense Refill  . aspirin 81 MG tablet Take 1 tablet (81 mg total) by mouth daily. 100 tablet 3  . Blood Glucose Monitoring Suppl (FREESTYLE LITE) DEVI Use to check blood sugar once a day.  DX 250.00 (Patient not taking: Reported on 05/02/2014) 1 each 0  . cetirizine (ZYRTEC) 10 MG tablet Take 1 tablet (10 mg total) by mouth daily. (Patient not taking: Reported on 05/02/2014) 30 tablet 11  . clomiPHENE (CLOMID) 50 MG tablet Take 50 mg by mouth daily.    . furosemide (LASIX) 20 MG tablet One tab by mouth once daily as needed for swelling (Patient not taking: Reported on 05/02/2014)    . glucose blood (FREESTYLE LITE) test strip Use as instructed to check blood sugar once a day.  DX E11.9 (Patient not taking: Reported on 05/02/2014) 100 each 1  . hydrALAZINE (APRESOLINE) 50 MG tablet TAKE 1 TABLET TWICE  A DAY (Patient taking differently: TAKE 1 TABLET daily) 180 tablet 1  . hydrocortisone 2.5 % lotion Apply topically 2 (two) times daily. 59 mL 0  . Lancets (FREESTYLE) lancets Use to check blood sugar once a day.  DX E11.9 (Patient not taking: Reported on 05/02/2014) 100 each 1  . metoprolol succinate (TOPROL-XL) 100 MG 24 hr tablet TAKE 1 TABLET DAILY WITH OR IMMEDIATELY FOLLOWING A MEAL 90 tablet 1  . niacin (NIASPAN) 500 MG CR tablet TAKE 1 TABLET (500 MG TOTAL) BY MOUTH AT BEDTIME. 90 tablet 1  . Omega-3 Fatty Acids (FISH OIL) 1000 MG CAPS Take 2 capsules (2,000 mg total) by mouth 2 (two) times daily.  (Patient not taking: Reported on 05/02/2014)  0  . omeprazole (PRILOSEC) 20 MG capsule TAKE 2 CAPSULES EVERY MORNING 180 capsule 1  . potassium chloride SA (K-DUR,KLOR-CON) 20 MEQ tablet Take 1 tablet (20 mEq total) by mouth daily. 30 tablet 3  . sildenafil (VIAGRA) 100 MG tablet Take 1/2 to 1 tablet daily as needed 15 tablet 0  . traMADol (ULTRAM) 50 MG tablet Take 1 tablet (50 mg total) by mouth every 8 (eight) hours as needed. 30 tablet 0  . TRIBENZOR 40-10-25 MG TABS TAKE 1 TABLET DAILY 90 tablet 1  . zolpidem (AMBIEN) 10 MG tablet Take 1 tablet (10 mg total) by mouth at bedtime as needed. 90 tablet 0  . [DISCONTINUED] potassium chloride (K-DUR) 10 MEQ tablet Take 1 tablet (10 mEq total) by mouth daily. 30 tablet 1   Current Facility-Administered Medications on File Prior to Visit  Medication Dose Route Frequency Provider Last Rate Last Dose  . triamcinolone acetonide (KENALOG) 10 MG/ML injection 10 mg  10 mg Other Once Harriet Masson, DPM        Past Surgical History  Procedure Laterality Date  . Knee surgery  08/04/08    Right knee-- medial meniscus repair, attenuation anterior cruciate [name] MD [facility])   . Ankle surgery    . Foot surgery    . Shoulder surgery    . Varicocele excision    . Plantar fascia release      Allergies  Allergen Reactions  . Oxycodone-Acetaminophen Hives and Itching    History   Social History  . Marital Status: Married    Spouse Name: N/A    Number of Children: 2  . Years of Education: N/A   Occupational History  . FOOTBALL COACH    Social History Main Topics  . Smoking status: Never Smoker   . Smokeless tobacco: Never Used  . Alcohol Use: No     Comment: occasional wine  . Drug Use: No  . Sexual Activity: Not on file   Other Topics Concern  . Not on file   Social History Narrative   Regular exercise:  No   Caffeine use:  2--32oz cups daily          Family History  Problem Relation Age of Onset  .  Diabetes Mother   . Hypertension Mother   . Arthritis Mother   . Cancer Mother     uterine and breast cancer  . Hyperlipidemia Father   . Arthritis Father   . Cancer Father     prostate cancer  . Hyperlipidemia Maternal Grandmother   . Hypertension Maternal Grandmother   . Hyperlipidemia Maternal Grandfather   . Hypertension Maternal Grandfather   . Hyperlipidemia Paternal Grandmother   . Hypertension Paternal Grandmother   . Hyperlipidemia Paternal Grandfather   .  Hypertension Paternal Grandfather   . Heart attack Paternal Grandfather   . Sudden death Neg Hx   . Colon cancer Neg Hx   . Esophageal cancer Neg Hx   . Stomach cancer Neg Hx   . Rectal cancer Neg Hx     BP 127/77 mmHg  Pulse 86  Ht 6\' 1"  (1.854 m)  Wt 320 lb 9.6 oz (145.423 kg)  BMI 42.31 kg/m2  Review of Systems: See HPI above.    Objective:  Physical Exam:  Gen: NAD  Exam not repeated today.  R knee:  1+ synovitis - healed arthroscopy scars anterior knee. No other deformity, ecchymoses. 2+ crepitation. Mod effuson. Mod medial joint line tenderness.  No other tenderness. ROM 0-110 degrees.  Negative ant/post drawers. Negative valgus/varus testing. Negative lachmanns.  Negative mcmurrays, apleys.  NV intact distally.  L knee: No gross deformity, ecchymoses, effusion. TTP lateral joint line > medial joint line. FROM. Negative ant/post drawers. Negative valgus/varus testing. Negative lachmanns. Negative mcmurrays, apleys, patellar apprehension. NV intact distally.     Assessment & Plan:   1. Right knee pain - 2/2 moderate DJD of right knee. Will go ahead with approval process for 4th and 5th injections of supartz.    2. Left knee pain - surprisingly has minimal DJD of this knee.  Possible he has more DJD than is seen on plain radiographs vs degenerative meniscus tear.  He will return for cortisone injection.  If not improving will go ahead with MRI.

## 2014-05-12 ENCOUNTER — Telehealth: Payer: Self-pay | Admitting: Family

## 2014-05-12 MED ORDER — ASPIRIN 81 MG PO TABS: 81.0000 mg | ORAL_TABLET | Freq: Every day | ORAL | Status: DC

## 2014-05-12 NOTE — Telephone Encounter (Signed)
Verified refills request with pt. 2 week supply sent to [facility], [facility] day supply [facility]. Pt notified.

## 2014-05-12 NOTE — Telephone Encounter (Signed)
Caller name: Keng, Jewel Relation to pt: self  Call back number: [phone]   Reason for call:   Pt requesting a refill aspirin 81 MG tablet  Please send to   [facility], [place] [phone] (Phone)   Pt states he is completely out please send a few pills to  [facility], [place] [facility] [place] [phone] (Phone)

## 2014-05-16 ENCOUNTER — Encounter: Payer: Self-pay | Admitting: Family

## 2014-05-16 ENCOUNTER — Ambulatory Visit (INDEPENDENT_AMBULATORY_CARE_PROVIDER_SITE_OTHER): Admitting: Family

## 2014-05-16 VITALS — BP 150/88 | HR 81 | Temp 98.2°F | Resp 16 | Ht 73.0 in | Wt 322.1 lb

## 2014-05-16 DIAGNOSIS — E119 Type 2 diabetes mellitus without complications: Secondary | ICD-10-CM

## 2014-05-16 DIAGNOSIS — I10 Essential (primary) hypertension: Secondary | ICD-10-CM

## 2014-05-16 NOTE — Progress Notes (Signed)
Pre visit review using our clinic review tool, if applicable. No additional management support is needed unless otherwise documented below in the visit note. 

## 2014-05-16 NOTE — Assessment & Plan Note (Signed)
surprisingly has minimal DJD of this knee.  Possible he has more DJD than is seen on plain radiographs vs degenerative meniscus tear.  Injection given today.  If not improving will go ahead with MRI.    After informed written consent, patient was seated on exam table. Left knee was prepped with alcohol swab and utilizing anteromedial approach, patient's left knee was injected intraarticularly with 3:1 marcaine: depomedrol. Patient tolerated the procedure well without immediate complications.

## 2014-05-16 NOTE — Patient Instructions (Signed)
Please schedule fasting physical in March.  Change hydralazine to 50mg  twice daily. Check blood pressure once daily and send me your readings in 1 week by mychart.

## 2014-05-16 NOTE — Progress Notes (Signed)
Patient ID: Charles Nash, male   DOB: [birth year], 49 y.o.   MRN: [id number]  49 yo M here for bilateral knee pain   01/17/11:  Patient with history of two knee arthroscopies for meniscal debridements.  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  Some swelling.  Has grinding and catching anterior and medial knee.  Has iced knee but not taking any pain medicine.  Has h/o cortisone injections in past but none in past year or so.   10/19:  Patient did not have pain improvement with cortisone injection.  Was examined in training room at school and he would like to move forward with synvisc injections for his DJD of right knee.  No change in symptoms from last visit.   10/26:  Patient did well with first synvisc injection -- here today for second one.  Had good pain relief even after first injection though some soreness now.   02/14/11:  Overall doing well - actually having superficial knee pain over patellar tendon and kneecap. No joint line pain now.  Here for third synvisc injection.   02/06/14: Patient reports his right knee pain has worsened over past couple months and very bad past week. Increased swelling. Pain mainly medial. No catching, locking, giving out. Did well with supartz series and had approval to do this again.  02/13/14: Patient returns for second supartz injection. Has had some improvement.  11/18: Patient returns for third supartz injection. Some improvement though pain still 6/10 level.  05/09/14: Patient reports both knees bothering him - left knee hurting more now at 4/10 level - has not had injections in this knee, no prior x-rays. Right knee still 3/10 - would like to do 1 or 2 more supartz injections. Left knee feels like it is coming out of place at times, feels unstable and  may give out.  05/11/14: Patient returns for supartz injection of right knee, cortisone injection of left knee.  Past Medical History  Diagnosis Date  . Hyperlipidemia   . Diabetes mellitus   . Hypertension   . OSA (obstructive sleep apnea)   . Arthritis   . Allergy   . Personal history of colonic polyps   . GERD (gastroesophageal reflux disease)   . Fatty liver 08/06/2011  . Bilateral varicoceles   . Plantar fasciitis   . ADHD (attention deficit hyperactivity disorder)   . PTSD (post-traumatic stress disorder)     Current Outpatient Prescriptions on File Prior to Visit  Medication Sig Dispense Refill  . Blood Glucose Monitoring Suppl (FREESTYLE LITE) DEVI Use to check blood sugar once a day.  DX 250.00 1 each 0  . cetirizine (ZYRTEC) 10 MG tablet Take 1 tablet (10 mg total) by mouth daily. 30 tablet 11  . clomiPHENE (CLOMID) 50 MG tablet Take 50 mg by mouth daily.    . furosemide (LASIX) 20 MG tablet One tab by mouth once daily as needed for swelling    . glucose blood (FREESTYLE LITE) test strip Use as instructed to check blood sugar once a day.  DX E11.9 100 each 1  . hydrALAZINE (APRESOLINE) 50 MG tablet TAKE 1 TABLET TWICE A DAY (Patient taking differently: TAKE 1 TABLET daily) 180 tablet 1  . hydrocortisone 2.5 % lotion Apply topically 2 (two) times daily. 59 mL 0  .  Lancets (FREESTYLE) lancets Use to check blood sugar once a day.  DX E11.9 100 each 1  . metoprolol succinate (TOPROL-XL) 100 MG 24 hr tablet TAKE 1 TABLET DAILY WITH OR IMMEDIATELY FOLLOWING A MEAL 90 tablet 1  . niacin (NIASPAN) 500 MG CR tablet TAKE 1 TABLET (500 MG TOTAL) BY MOUTH AT BEDTIME. 90 tablet 1  . Omega-3 Fatty Acids (FISH OIL) 1000 MG CAPS Take 2 capsules (2,000 mg total) by mouth 2 (two) times daily.  0  . omeprazole (PRILOSEC) 20 MG capsule TAKE 2 CAPSULES EVERY MORNING 180 capsule 1  . potassium chloride SA (K-DUR,KLOR-CON) 20 MEQ tablet Take 1 tablet (20 mEq total) by mouth daily. 30 tablet 3   . sildenafil (VIAGRA) 100 MG tablet Take 1/2 to 1 tablet daily as needed 15 tablet 0  . traMADol (ULTRAM) 50 MG tablet Take 1 tablet (50 mg total) by mouth every 8 (eight) hours as needed. 30 tablet 0  . TRIBENZOR 40-10-25 MG TABS TAKE 1 TABLET DAILY 90 tablet 1  . zolpidem (AMBIEN) 10 MG tablet Take 1 tablet (10 mg total) by mouth at bedtime as needed. 90 tablet 0  . [DISCONTINUED] potassium chloride (K-DUR) 10 MEQ tablet Take 1 tablet (10 mEq total) by mouth daily. 30 tablet 1   Current Facility-Administered Medications on File Prior to Visit  Medication Dose Route Frequency Provider Last Rate Last Dose  . triamcinolone acetonide (KENALOG) 10 MG/ML injection 10 mg  10 mg Other Once Harriet Masson, DPM        Past Surgical History  Procedure Laterality Date  . Knee surgery  08/04/08    Right knee-- medial meniscus repair, attenuation anterior cruciate [name] MD [facility])   . Ankle surgery    . Foot surgery    . Shoulder surgery    . Varicocele excision    . Plantar fascia release      Allergies  Allergen Reactions  . Oxycodone-Acetaminophen Hives and Itching    History   Social History  . Marital Status: Married    Spouse Name: N/A    Number of Children: 2  . Years of Education: N/A   Occupational History  . FOOTBALL COACH    Social History Main Topics  . Smoking status: Never Smoker   . Smokeless tobacco: Never Used  . Alcohol Use: No     Comment: occasional wine  . Drug Use: No  . Sexual Activity: Not on file   Other Topics Concern  . Not on file   Social History Narrative   Regular exercise:  No   Caffeine use:  2--32oz cups daily          Family History  Problem Relation Age of Onset  . Diabetes Mother   . Hypertension Mother   . Arthritis Mother   . Cancer Mother     uterine and breast cancer  . Hyperlipidemia Father   . Arthritis Father   . Cancer Father     prostate cancer  . Hyperlipidemia Maternal Grandmother   .  Hypertension Maternal Grandmother   . Hyperlipidemia Maternal Grandfather   . Hypertension Maternal Grandfather   . Hyperlipidemia Paternal Grandmother   . Hypertension Paternal Grandmother   . Hyperlipidemia Paternal Grandfather   . Hypertension Paternal Grandfather   . Heart attack Paternal Grandfather   . Sudden death Neg Hx   . Colon cancer Neg Hx   . Esophageal cancer Neg Hx   . Stomach cancer Neg Hx   .  Rectal cancer Neg Hx     BP 189/81 mmHg  Pulse 75  Ht 6\' 1"  (1.854 m)  Wt 322 lb 3.2 oz (146.149 kg)  BMI 42.52 kg/m2  Review of Systems: See HPI above.    Objective:  Physical Exam:  Gen: NAD  Exam not repeated today.  R knee:  1+ synovitis - healed arthroscopy scars anterior knee. No other deformity, ecchymoses. 2+ crepitation. Mod effuson. Mod medial joint line tenderness.  No other tenderness. ROM 0-110 degrees.  Negative ant/post drawers. Negative valgus/varus testing. Negative lachmanns.  Negative mcmurrays, apleys.  NV intact distally.  L knee: No gross deformity, ecchymoses, effusion. TTP lateral joint line > medial joint line. FROM. Negative ant/post drawers. Negative valgus/varus testing. Negative lachmanns. Negative mcmurrays, apleys, patellar apprehension. NV intact distally.     Assessment & Plan:   1. Right knee pain - 2/2 moderate DJD of right knee. Will go ahead with 4th supartz injection - he will call us when he wants to do 5th injection.  After informed written consent, patient was seated on exam table. Right knee was prepped with alcohol swab and utilizing anteromedial approach, patient's right knee was injected intraarticularly with 54mL marcaine and supartz. Patient tolerated the procedure well without immediate complications.    2. Left knee pain - surprisingly has minimal DJD of this knee.  Possible he has more DJD than is seen on plain radiographs vs degenerative meniscus tear.  Injection given today.  If not improving will go ahead with  MRI.    After informed written consent, patient was seated on exam table. Left knee was prepped with alcohol swab and utilizing anteromedial approach, patient's left knee was injected intraarticularly with 3:1 marcaine: depomedrol. Patient tolerated the procedure well without immediate complications.

## 2014-05-16 NOTE — Progress Notes (Signed)
Subjective:    Patient ID: Charles Nash, male    DOB: [birth year], 49 y.o.   MRN: [id number]  HPI Mr. Pettijohn is here today for follow up for visit on 1/19 with Dr. Larose Kells for chest pain. CXR was wnl. No longer having chest pain.   1. HYPERTENSION: Reports compliance with tribenzor and hydralazine, and metoprolol.  Runs 140s-150 over 70s-80s. Denies chest pain, dyspnea, blurred vision, headache, or lower extremity edema.  BP Readings from Last 3 Encounters:  05/16/14 150/88  05/11/14 189/81  05/09/14 127/77   2. DM: Fasting BS: 86-160 Diet: Doing "Maximized Living" through [facility] in [place]. Taking fish oil (Optimal Omega), vit D3, and men's MV from that brand. Not eating sweets. Has cut down on pasta, rice, potatoes. Drinks only water and one glass wine QOD.  Exercise: no exercise. Eye exam:  Due   Lab Results  Component Value Date   HGBA1C 6.6* 03/01/2014   HGBA1C 6.1* 11/08/2013   HGBA1C 6.2* 07/05/2013   Lab Results  Component Value Date   MICROALBUR 3.18* 11/08/2013   LDLCALC 93 11/08/2013   CREATININE 1.0 03/13/2014    Review of Systems See HPI  Past Medical History  Diagnosis Date  . Hyperlipidemia   . Diabetes mellitus   . Hypertension   . OSA (obstructive sleep apnea)   . Arthritis   . Allergy   . Personal history of colonic polyps   . GERD (gastroesophageal reflux disease)   . Fatty liver 08/06/2011  . Bilateral varicoceles   . Plantar fasciitis   . ADHD (attention deficit hyperactivity disorder)   . PTSD (post-traumatic stress disorder)     History   Social History  . Marital Status: Married    Spouse Name: N/A    Number of Children: 2  . Years of Education: N/A   Occupational History  . FOOTBALL COACH    Social History Main Topics  . Smoking status: Never Smoker   . Smokeless tobacco: Never Used  . Alcohol Use: No     Comment: occasional wine  . Drug Use: No  . Sexual Activity: Not on file   Other  Topics Concern  . Not on file   Social History Narrative   Regular exercise:  No   Caffeine use:  2--32oz cups daily          Past Surgical History  Procedure Laterality Date  . Knee surgery  08/04/08    Right knee-- medial meniscus repair, attenuation anterior cruciate [name] MD [facility])   . Ankle surgery    . Foot surgery    . Shoulder surgery    . Varicocele excision    . Plantar fascia release      Family History  Problem Relation Age of Onset  . Diabetes Mother   . Hypertension Mother   . Arthritis Mother   . Cancer Mother     uterine and breast cancer  . Hyperlipidemia Father   . Arthritis Father   . Cancer Father     prostate cancer  . Hyperlipidemia Maternal Grandmother   . Hypertension Maternal Grandmother   . Hyperlipidemia Maternal Grandfather   . Hypertension Maternal Grandfather   . Hyperlipidemia Paternal Grandmother   . Hypertension Paternal Grandmother   . Hyperlipidemia Paternal Grandfather   . Hypertension Paternal Grandfather   . Heart attack Paternal Grandfather   . Sudden death Neg Hx   . Colon cancer Neg Hx   . Esophageal cancer  Neg Hx   . Stomach cancer Neg Hx   . Rectal cancer Neg Hx     Allergies  Allergen Reactions  . Oxycodone-Acetaminophen Hives and Itching    Current Outpatient Prescriptions on File Prior to Visit  Medication Sig Dispense Refill  . aspirin 81 MG tablet Take 1 tablet (81 mg total) by mouth daily. 14 tablet 0  . Blood Glucose Monitoring Suppl (FREESTYLE LITE) DEVI Use to check blood sugar once a day.  DX 250.00 1 each 0  . cetirizine (ZYRTEC) 10 MG tablet Take 1 tablet (10 mg total) by mouth daily. 30 tablet 11  . clomiPHENE (CLOMID) 50 MG tablet Take 50 mg by mouth daily.    . furosemide (LASIX) 20 MG tablet One tab by mouth once daily as needed for swelling    . glucose blood (FREESTYLE LITE) test strip Use as instructed to check blood sugar once a day.  DX E11.9 100 each 1  . hydrALAZINE  (APRESOLINE) 50 MG tablet TAKE 1 TABLET TWICE A DAY (Patient taking differently: TAKE 1 TABLET daily) 180 tablet 1  . hydrocortisone 2.5 % lotion Apply topically 2 (two) times daily. 59 mL 0  . Lancets (FREESTYLE) lancets Use to check blood sugar once a day.  DX E11.9 100 each 1  . metoprolol succinate (TOPROL-XL) 100 MG 24 hr tablet TAKE 1 TABLET DAILY WITH OR IMMEDIATELY FOLLOWING A MEAL 90 tablet 1  . niacin (NIASPAN) 500 MG CR tablet TAKE 1 TABLET (500 MG TOTAL) BY MOUTH AT BEDTIME. 90 tablet 1  . Omega-3 Fatty Acids (FISH OIL) 1000 MG CAPS Take 2 capsules (2,000 mg total) by mouth 2 (two) times daily.  0  . omeprazole (PRILOSEC) 20 MG capsule TAKE 2 CAPSULES EVERY MORNING 180 capsule 1  . potassium chloride SA (K-DUR,KLOR-CON) 20 MEQ tablet Take 1 tablet (20 mEq total) by mouth daily. 30 tablet 3  . sildenafil (VIAGRA) 100 MG tablet Take 1/2 to 1 tablet daily as needed 15 tablet 0  . traMADol (ULTRAM) 50 MG tablet Take 1 tablet (50 mg total) by mouth every 8 (eight) hours as needed. 30 tablet 0  . TRIBENZOR 40-10-25 MG TABS TAKE 1 TABLET DAILY 90 tablet 1  . zolpidem (AMBIEN) 10 MG tablet Take 1 tablet (10 mg total) by mouth at bedtime as needed. 90 tablet 0  . [DISCONTINUED] potassium chloride (K-DUR) 10 MEQ tablet Take 1 tablet (10 mEq total) by mouth daily. 30 tablet 1   Current Facility-Administered Medications on File Prior to Visit  Medication Dose Route Frequency Provider Last Rate Last Dose  . triamcinolone acetonide (KENALOG) 10 MG/ML injection 10 mg  10 mg Other Once Richard Sikora, DPM        BP 150/88 mmHg  Pulse 81  Temp(Src) 98.2 F (36.8 C) (Oral)  Resp 16  Ht 6\' 1"  (1.854 m)  Wt 322 lb 2 oz (146.115 kg)  BMI 42.51 kg/m2  SpO2 97%       Objective:   Physical Exam  Constitutional: He is oriented to person, place, and time.  Cardiovascular: Normal rate, regular rhythm, normal heart sounds and intact distal pulses.  Exam reveals no gallop and no friction rub.   No  murmur heard. Pulmonary/Chest: Effort normal and breath sounds normal. No respiratory distress. He has no wheezes. He has no rales.  Musculoskeletal: He exhibits no edema.  Neurological: He is alert and oriented to person, place, and time.  Skin: Skin is warm and dry.  Psychiatric:  He has a normal mood and affect. His behavior is normal. Judgment and thought content normal.          Assessment & Plan:  Patient seen along with [facility] NP-student.  I have personally seen and examined patient and agree with Ms. Whitmire's assessment and plan- Debbrah Alar NP

## 2014-05-16 NOTE — Assessment & Plan Note (Signed)
Will go ahead with 4th supartz injection - he will call us when he wants to do 5th injection.  After informed written consent, patient was seated on exam table. Right knee was prepped with alcohol swab and utilizing anteromedial approach, patient's right knee was injected intraarticularly with 49mL marcaine and supartz. Patient tolerated the procedure well without immediate complications.

## 2014-05-16 NOTE — Assessment & Plan Note (Signed)
BP elevated. Increase hydralazine to 50 mg BID. Will do BMET next month at [facility].

## 2014-05-16 NOTE — Assessment & Plan Note (Signed)
Increase exercise and reduce carbs in diet. Check A1c at [facility] next month.

## 2014-05-19 ENCOUNTER — Other Ambulatory Visit: Payer: Self-pay | Admitting: Family

## 2014-06-02 ENCOUNTER — Ambulatory Visit: Admitting: Family

## 2014-06-13 ENCOUNTER — Encounter: Payer: Self-pay | Admitting: Family

## 2014-06-13 ENCOUNTER — Other Ambulatory Visit: Payer: Self-pay | Admitting: Family

## 2014-06-13 ENCOUNTER — Ambulatory Visit (INDEPENDENT_AMBULATORY_CARE_PROVIDER_SITE_OTHER): Admitting: Family

## 2014-06-13 VITALS — BP 130/86 | HR 70 | Temp 98.6°F | Resp 16 | Ht 73.0 in | Wt 327.8 lb

## 2014-06-13 DIAGNOSIS — E042 Nontoxic multinodular goiter: Secondary | ICD-10-CM

## 2014-06-13 DIAGNOSIS — N189 Chronic kidney disease, unspecified: Secondary | ICD-10-CM

## 2014-06-13 DIAGNOSIS — M545 Low back pain, unspecified: Secondary | ICD-10-CM

## 2014-06-13 DIAGNOSIS — N5089 Other specified disorders of the male genital organs: Secondary | ICD-10-CM

## 2014-06-13 DIAGNOSIS — Z0000 Encounter for general adult medical examination without abnormal findings: Secondary | ICD-10-CM

## 2014-06-13 DIAGNOSIS — M1711 Unilateral primary osteoarthritis, right knee: Secondary | ICD-10-CM

## 2014-06-13 DIAGNOSIS — E1122 Type 2 diabetes mellitus with diabetic chronic kidney disease: Secondary | ICD-10-CM

## 2014-06-13 DIAGNOSIS — E049 Nontoxic goiter, unspecified: Secondary | ICD-10-CM

## 2014-06-13 DIAGNOSIS — E010 Iodine-deficiency related diffuse (endemic) goiter: Secondary | ICD-10-CM

## 2014-06-13 DIAGNOSIS — N508 Other specified disorders of male genital organs: Secondary | ICD-10-CM

## 2014-06-13 DIAGNOSIS — M5430 Sciatica, unspecified side: Secondary | ICD-10-CM

## 2014-06-13 DIAGNOSIS — N442 Benign cyst of testis: Secondary | ICD-10-CM

## 2014-06-13 LAB — BASIC METABOLIC PANEL
BUN: 10 mg/dL (ref 6–23)
CO2: 28 mEq/L (ref 19–32)
Calcium: 9.4 mg/dL (ref 8.4–10.5)
Chloride: 102 mEq/L (ref 96–112)
Creatinine, Ser: 0.93 mg/dL (ref 0.40–1.50)
GFR: 111.55 mL/min (ref >60.00)
GLUCOSE: 114 mg/dL — AB (ref 70–99)
POTASSIUM: 3.7 meq/L (ref 3.5–5.1)
SODIUM: 136 meq/L (ref 135–145)

## 2014-06-13 LAB — HEPATIC FUNCTION PANEL
ALT: 36 U/L (ref 0–53)
AST: 29 U/L (ref 0–37)
Albumin: 4.5 g/dL (ref 3.5–5.2)
Alkaline Phosphatase: 63 U/L (ref 39–117)
BILIRUBIN DIRECT: 0.1 mg/dL (ref 0.0–0.3)
Total Bilirubin: 0.9 mg/dL (ref 0.2–1.2)
Total Protein: 7.6 g/dL (ref 6.0–8.3)

## 2014-06-13 LAB — CBC WITH DIFFERENTIAL/PLATELET
Basophils Absolute: 0 10*3/uL (ref 0.0–0.1)
Basophils Relative: 0.5 % (ref 0.0–3.0)
Eosinophils Absolute: 0.1 10*3/uL (ref 0.0–0.7)
Eosinophils Relative: 1.2 % (ref 0.0–5.0)
HEMATOCRIT: 45.5 % (ref 39.0–52.0)
HEMOGLOBIN: 15.3 g/dL (ref 13.0–17.0)
LYMPHS ABS: 1.8 10*3/uL (ref 0.7–4.0)
Lymphocytes Relative: 35.1 % (ref 12.0–46.0)
MCHC: 33.7 g/dL (ref 30.0–36.0)
MCV: 85.4 fl (ref 78.0–100.0)
Monocytes Absolute: 0.4 10*3/uL (ref 0.1–1.0)
Monocytes Relative: 8.3 % (ref 3.0–12.0)
NEUTROS ABS: 2.8 10*3/uL (ref 1.4–7.7)
Neutrophils Relative %: 54.9 % (ref 43.0–77.0)
Platelets: 273 10*3/uL (ref 150.0–400.0)
RBC: 5.32 Mil/uL (ref 4.22–5.81)
RDW: 14.2 % (ref 11.5–15.5)
WBC: 5.1 10*3/uL (ref 4.0–10.5)

## 2014-06-13 LAB — HEMOGLOBIN A1C: HEMOGLOBIN A1C: 7.2 % — AB (ref 4.6–6.5)

## 2014-06-13 LAB — URINALYSIS, ROUTINE W REFLEX MICROSCOPIC
BILIRUBIN URINE: NEGATIVE
Hgb urine dipstick: NEGATIVE
Ketones, ur: NEGATIVE
Leukocytes, UA: NEGATIVE
Nitrite: NEGATIVE
PH: 6.5 (ref 5.0–8.0)
RBC / HPF: NONE SEEN (ref 0–?)
Specific Gravity, Urine: 1.02 (ref 1.000–1.030)
TOTAL PROTEIN, URINE-UPE24: NEGATIVE
Urine Glucose: NEGATIVE
Urobilinogen, UA: 0.2 (ref 0.0–1.0)

## 2014-06-13 LAB — LIPID PANEL
CHOLESTEROL: 173 mg/dL (ref 0–200)
HDL: 52.3 mg/dL (ref >39.00)
LDL CALC: 107 mg/dL — AB (ref 0–99)
NonHDL: 120.7
TRIGLYCERIDES: 67 mg/dL (ref 0.0–149.0)
Total CHOL/HDL Ratio: 3
VLDL: 13.4 mg/dL (ref 0.0–40.0)

## 2014-06-13 LAB — PSA: PSA: 0.89 ng/mL (ref 0.10–4.00)

## 2014-06-13 LAB — TSH: TSH: 0.49 u[IU]/mL (ref 0.35–4.50)

## 2014-06-13 NOTE — Assessment & Plan Note (Signed)
Obtain follow up A1C.   

## 2014-06-13 NOTE — Progress Notes (Signed)
Subjective:    Patient ID: Charles Nash, male    DOB: 1965-10-05, 49 y.o.   MRN: [id number]  HPI  Patient presents today for complete physical.  Immunizations: up to date. Diet: has eliminated sweet tea and soda, trying to improve his diet.   Wt Readings from Last 3 Encounters:  06/13/14 327 lb 12.8 oz (148.689 kg)  05/16/14 322 lb 2 oz (146.115 kg)  05/11/14 322 lb 3.2 oz (146.149 kg)  Exercise: not exercising due to low back pain- he is seeing a Public librarian.  Colonoscopy:  Due 2018  DM2- will schedule eye exam in march.  Lab Results  Component Value Date   HGBA1C 6.6* 03/01/2014   Bilateral knee pain- followed by Dr. Barbaraann Barthel.   Low back pain- reports that pain radiates down both buttocks.  Reports that he has had MRI fo the lumbar spine at the [place] and was told that L5 was bulging.  Reports chiropractor is focusing on this area.   Hx of thyroid cyst and testicular cyst. He is due for follow up ultrasounds.  Review of Systems     Past Medical History  Diagnosis Date  . Hyperlipidemia   . Diabetes mellitus   . Hypertension   . OSA (obstructive sleep apnea)   . Arthritis   . Allergy   . Personal history of colonic polyps   . GERD (gastroesophageal reflux disease)   . Fatty liver 08/06/2011  . Bilateral varicoceles   . Plantar fasciitis   . ADHD (attention deficit hyperactivity disorder)   . PTSD (post-traumatic stress disorder)     History   Social History  . Marital Status: Married    Spouse Name: N/A  . Number of Children: 2  . Years of Education: N/A   Occupational History  . FOOTBALL COACH    Social History Main Topics  . Smoking status: Never Smoker   . Smokeless tobacco: Never Used  . Alcohol Use: No     Comment: occasional wine  . Drug Use: No  . Sexual Activity: Not on file   Other Topics Concern  . Not on file   Social History Narrative   Regular exercise:  No   Caffeine use:  2--32oz cups daily          Past  Surgical History  Procedure Laterality Date  . Knee surgery  08/04/08    Right knee-- medial meniscus repair, attenuation anterior cruciate [name] MD [facility])   . Ankle surgery    . Foot surgery    . Shoulder surgery    . Varicocele excision    . Plantar fascia release      Family History  Problem Relation Age of Onset  . Diabetes Mother   . Hypertension Mother   . Arthritis Mother   . Cancer Mother     uterine and breast cancer  . Hyperlipidemia Father   . Arthritis Father   . Cancer Father     thyroid, prostate cancer  . Other Father     mass in stomach  . Hyperlipidemia Maternal Grandmother   . Hypertension Maternal Grandmother   . Hyperlipidemia Maternal Grandfather   . Hypertension Maternal Grandfather   . Hyperlipidemia Paternal Grandmother   . Hypertension Paternal Grandmother   . Hyperlipidemia Paternal Grandfather   . Hypertension Paternal Grandfather   . Heart attack Paternal Grandfather   . Sudden death Neg Hx   . Colon cancer Neg Hx   . Esophageal cancer Neg  Hx   . Stomach cancer Neg Hx   . Rectal cancer Neg Hx     Allergies  Allergen Reactions  . Oxycodone-Acetaminophen Hives and Itching    Current Outpatient Prescriptions on File Prior to Visit  Medication Sig Dispense Refill  . aspirin 81 MG tablet Take 1 tablet (81 mg total) by mouth daily. 14 tablet 0  . Blood Glucose Monitoring Suppl (FREESTYLE LITE) DEVI Use to check blood sugar once a day.  DX 250.00 1 each 0  . cetirizine (ZYRTEC) 10 MG tablet Take 1 tablet (10 mg total) by mouth daily. 30 tablet 11  . clomiPHENE (CLOMID) 50 MG tablet Take 50 mg by mouth daily.    . hydrALAZINE (APRESOLINE) 50 MG tablet TAKE 1 TABLET TWICE A DAY (Patient taking differently: TAKE 1 TABLET daily) 180 tablet 1  . hydrocortisone 2.5 % lotion Apply topically 2 (two) times daily. 59 mL 0  . Lancets (FREESTYLE) lancets Use to check blood sugar once a day.  DX E11.9 100 each 1  . metoprolol  succinate (TOPROL-XL) 100 MG 24 hr tablet TAKE 1 TABLET DAILY WITH OR IMMEDIATELY FOLLOWING A MEAL 90 tablet 1  . Omega-3 Fatty Acids (FISH OIL) 1000 MG CAPS Take 2 capsules (2,000 mg total) by mouth 2 (two) times daily.  0  . omeprazole (PRILOSEC) 20 MG capsule TAKE 2 CAPSULES EVERY MORNING 180 capsule 1  . potassium chloride SA (K-DUR,KLOR-CON) 20 MEQ tablet Take 1 tablet (20 mEq total) by mouth daily. 30 tablet 3  . sildenafil (VIAGRA) 100 MG tablet Take 1/2 to 1 tablet daily as needed 15 tablet 0  . traMADol (ULTRAM) 50 MG tablet Take 1 tablet (50 mg total) by mouth every 8 (eight) hours as needed. 30 tablet 0  . TRIBENZOR 40-10-25 MG TABS TAKE 1 TABLET DAILY 90 tablet 1  . zolpidem (AMBIEN) 10 MG tablet Take 1 tablet (10 mg total) by mouth at bedtime as needed. 90 tablet 0  . furosemide (LASIX) 20 MG tablet One tab by mouth once daily as needed for swelling (Patient not taking: Reported on 06/13/2014)    . glucose blood (FREESTYLE LITE) test strip Use as instructed to check blood sugar once a day.  DX E11.9 (Patient not taking: Reported on 06/13/2014) 100 each 1  . niacin (NIASPAN) 500 MG CR tablet TAKE 1 TABLET (500 MG TOTAL) BY MOUTH AT BEDTIME. (Patient not taking: Reported on 06/13/2014) 90 tablet 1  . [DISCONTINUED] potassium chloride (K-DUR) 10 MEQ tablet Take 1 tablet (10 mEq total) by mouth daily. 30 tablet 1   Current Facility-Administered Medications on File Prior to Visit  Medication Dose Route Frequency Provider Last Rate Last Dose  . triamcinolone acetonide (KENALOG) 10 MG/ML injection 10 mg  10 mg Other Once Richard Sikora, DPM        BP 130/86 mmHg  Pulse 70  Temp(Src) 98.6 F (37 C) (Oral)  Resp 16  Ht 6\' 1"  (1.854 m)  Wt 327 lb 12.8 oz (148.689 kg)  BMI 43.26 kg/m2  SpO2 98%    Objective:   Physical Exam Physical Exam  Constitutional: He is oriented to person, place, and time. He appears well-developed and well-nourished. No distress.  HENT:  Head: Normocephalic and  atraumatic.  Right Ear: Tympanic membrane and ear canal normal.  Left Ear: Tympanic membrane and ear canal normal.  Mouth/Throat: Oropharynx is clear and moist.  Eyes: Pupils are equal, round, and reactive to light. No scleral icterus.  Neck: Normal range  of motion. No thyromegaly present.  Cardiovascular: Normal rate and regular rhythm.   No murmur heard. Pulmonary/Chest: Effort normal and breath sounds normal. No respiratory distress. He has no wheezes. He has no rales. He exhibits no tenderness.  Abdominal: Soft. Bowel sounds are normal. He exhibits no distension and no mass. There is no tenderness. There is no rebound and no guarding.  Musculoskeletal: He exhibits no edema.  Lymphadenopathy:    He has no cervical adenopathy.  Neurological: He is alert and oriented to person, place, and time. He has normal reflexes. He exhibits normal muscle tone. Coordination normal.  Skin: Skin is warm and dry.  Psychiatric: He has a normal mood and affect. His behavior is normal. Judgment and thought content normal.          Assessment & Plan:          Assessment & Plan:  Preventative care- discussed diet/exercise weight loss.  Immunizations reviewed and up to date.  Obtain routine labs including PSA.

## 2014-06-13 NOTE — Assessment & Plan Note (Signed)
Obtain follow up thyroid ultrasound.

## 2014-06-13 NOTE — Assessment & Plan Note (Signed)
Following with sports medicine.

## 2014-06-13 NOTE — Progress Notes (Signed)
Pre visit review using our clinic review tool, if applicable. No additional management support is needed unless otherwise documented below in the visit note. 

## 2014-06-13 NOTE — Assessment & Plan Note (Signed)
Obtain follow up ultrasound.

## 2014-06-13 NOTE — Patient Instructions (Signed)
You will be contacted about your ultrasounds. Please complete lab work prior to leaving. You will be contacted about your referral for PT. Continue to work hard on healthy diet, exercise, weight loss.   Follow up in 3 months.

## 2014-06-13 NOTE — Assessment & Plan Note (Signed)
Pt is following with chiropractic. Will also refer for PT.

## 2014-06-14 ENCOUNTER — Ambulatory Visit: Attending: Family

## 2014-06-14 ENCOUNTER — Ambulatory Visit (INDEPENDENT_AMBULATORY_CARE_PROVIDER_SITE_OTHER): Admitting: Family Medicine

## 2014-06-14 ENCOUNTER — Encounter: Payer: Self-pay | Admitting: Family Medicine

## 2014-06-14 VITALS — BP 149/83 | HR 87 | Ht 73.0 in | Wt 325.0 lb

## 2014-06-14 DIAGNOSIS — M5441 Lumbago with sciatica, right side: Secondary | ICD-10-CM

## 2014-06-14 DIAGNOSIS — M25562 Pain in left knee: Secondary | ICD-10-CM

## 2014-06-14 DIAGNOSIS — M5442 Lumbago with sciatica, left side: Secondary | ICD-10-CM | POA: Diagnosis not present

## 2014-06-14 DIAGNOSIS — M25659 Stiffness of unspecified hip, not elsewhere classified: Secondary | ICD-10-CM | POA: Insufficient documentation

## 2014-06-14 NOTE — Therapy (Addendum)
[facility] [address]  [place], [place], [address] Phone: [phone]   Fax:  [phone]  Physical Therapy Evaluation  Patient Details  Name: Charles Nash MRN: [id number] Date of Birth: 03/21/1966 Referring Provider:  Debbrah Alar, NP  Encounter Date: 06/14/2014      PT End of Session - 06/14/14 0951    Visit Number 1   Number of Visits 12   Date for PT Re-Evaluation 07/27/13   PT Start Time 0852   PT Stop Time 0937   PT Time Calculation (min) 45 min   Activity Tolerance Patient tolerated treatment well   Behavior During Therapy [facility] for tasks assessed/performed      Past Medical History  Diagnosis Date  . Hyperlipidemia   . Diabetes mellitus   . Hypertension   . OSA (obstructive sleep apnea)   . Arthritis   . Allergy   . Personal history of colonic polyps   . GERD (gastroesophageal reflux disease)   . Fatty liver 08/06/2011  . Bilateral varicoceles   . Plantar fasciitis   . ADHD (attention deficit hyperactivity disorder)   . PTSD (post-traumatic stress disorder)     Past Surgical History  Procedure Laterality Date  . Knee surgery  08/04/08    Right knee-- medial meniscus repair, attenuation anterior cruciate [name] MD [facility])   . Ankle surgery    . Foot surgery    . Shoulder surgery    . Varicocele excision    . Plantar fascia release      There were no vitals taken for this visit.  Visit Diagnosis:  Bilateral low back pain with sciatica, sciatica laterality unspecified - Plan: PT plan of care cert/re-cert  Stiffness of hip joint, unspecified laterality - Plan: PT plan of care cert/re-cert      Subjective Assessment - 06/14/14 0857    Symptoms Patient with about 2 week history of lower back pain with radicular pain into bilateral buttocks with bending over to tie shoes.  Reports was doing an exercise routine with video a dance routine recently and  feels this may have aggravated pain.  Had history of fall at school in Oct 2014 and had therapy due to pain at that time.     How long can you sit comfortably? 1 hour   How long can you stand comfortably? 15 min    How long can you walk comfortably? 15-20 min   Diagnostic tests None   Patient Stated Goals Pain out of buttocks and lower back stronger   Currently in Pain? Yes   Pain Score 3   up to 6.5 at times   Pain Location Back   Pain Orientation Lower;Left;Right   Pain Descriptors / Indicators Aching;Sharp;Shooting;Tightness   Pain Type Acute pain   Pain Onset 1 to 4 weeks ago   Pain Frequency Constant   Aggravating Factors  exercise, bending, walking or standing too long   Pain Relieving Factors ice, lying down          [facility] PT Assessment - 06/14/14 0001    Assessment   Next MD Visit 3 months   Balance Screen   Has the patient fallen in the past 6 months Yes  slipped on ice   How many times? 1   Has the patient had a decrease in activity level because of a fear of falling?  No   Is the patient reluctant to leave their home because of a  fear of falling?  No   Home Environment   Living Enviornment Private residence   Living Arrangements Spouse/significant other   Type of [place] to enter   Entrance Stairs-Number of Steps 6   Entrance Stairs-Rails Left;Right   Home Layout Multi-level   Alternate Level Stairs-Number of Steps 8 to bedrooms or 8 to lower level   Alternate Level Stairs-Rails Left   Prior Function   Level of Independence Independent with basic ADLs;Independent with homemaking with ambulation   Vocation Retired   Leisure resigned recently from [employer] duties, hopes to return   Observation/Other Assessments   Focus on Therapeutic Outcomes (FOTO)  62%   ROM / Strength   AROM / PROM / Strength AROM;Strength   AROM   AROM Assessment Site Hip;Lumbar   Right/Left Hip Right;Left   Right Hip External Rotation  70   Right Hip Internal  Rotation  22   Left Hip External Rotation  70   Left Hip Internal Rotation  22   Lumbar Flexion reaches with fingertips about 4" from floor   Lumbar Extension WFL, but all motion comes from about L3-4   Lumbar - Right Side Bend limited about 25% reaches with fingertips about 2" from knee   Lumbar - Left Side Bend limited about 40% with pain on left side   Lumbar - Right Rotation WNL no pain   Lumbar - Left Rotation limited about 20% compared with right with pain on left side   Strength   Strength Assessment Site Knee;Hip;Ankle   Right/Left Hip Right;Left   Right Hip Flexion 4+/5   Right Hip Extension 4/5   Right Hip ABduction 5/5   Left Hip Flexion 4+/5  w/little pain   Left Hip Extension 4-/5   Left Hip ABduction 5/5   Right/Left Knee Right;Left   Right Knee Flexion 4+/5   Right Knee Extension 5/5   Left Knee Flexion 4+/5   Left Knee Extension 4+/5   Right/Left Ankle Right;Left   Right Ankle Dorsiflexion 5/5   Left Ankle Dorsiflexion 5/5   Flexibility   Soft Tissue Assessment /Muscle Length yes   Hamstrings tightness bilateral hamstrings  positive SLR for buttock pain on left   Special Tests    Special Tests Lumbar;Sacrolliac Tests   Sacroiliac Tests  Sacral Compression   FABER test   findings Positive   Side Right   Comment for pain in right buttocks   Slump test   Findings Positive   Side Left   Comment for pain in buttock   Straight Leg Raise   Findings Positive   Side  Left   Comment for pain in buttock   Pelvic Dictraction   Findings Negative   Side  --   Pelvic Compression   Findings Negative   Sacral Compression   Findings Negative                  [facility] Adult PT Treatment/Exercise - 06/14/14 0001    Exercises   Exercises Lumbar   Lumbar Exercises: Stretches   Prone on Elbows Stretch 10 seconds;1 rep   Press Ups 1 rep;10 seconds   Quadruped Mid Back Stretch 2 reps;20 seconds  with cues "child's pose" position, verbally issued to HEP    Lumbar Exercises: Seated   Other Seated Lumbar Exercises sitting leaning forward hands on table 20 second hold  verbally issued to HEP  PT Education - 2014-07-12 0951    Education provided Yes   Education Details HEP and POC   Person(s) Educated Patient   Methods Explanation;Demonstration   Comprehension Verbalized understanding          PT Short Term Goals - 2014/07/12 0955    PT SHORT TERM GOAL #1   Title Patient to be independent in initial HEP for flexibility.  07/07/14   Time 3   Period Weeks   Status New   PT SHORT TERM GOAL #2   Title Patient to report pain no more than 4/10 with daily activities.  07/07/14   Time 3   Period Weeks   Status New   PT SHORT TERM GOAL #3   Title Patient to report tolerating 30 minutes walking prior to pain increase.  07/07/14   Time 4   Period Weeks   Status New           PT Long Term Goals - 07/12/2014 1000    PT LONG TERM GOAL #1   Title Patient to be independent with advanced HEP for core strength and mobility.  07/28/14   Time 6   Period Weeks   Status New   PT LONG TERM GOAL #2   Title Patient to report pain no more than 3/10 with daily activities.  07/28/14   Time 6   Period Weeks   Status New   PT LONG TERM GOAL #3   Title Patient to report tolerating walking as needed for shopping/daily activities without pain increase.  07/28/14   Time 6   Period Weeks   Status New   PT LONG TERM GOAL #4   Title FOTO functional reporting improved with no more than 41% limitation.  07/28/14   Time 6   Period Weeks   Status New               Plan - 07/12/2014 1749    Clinical Impression Statement Patient presents with lumbar and buttock pain limiting tolerance to daily activities with standing, walking and bending.  Feel he will benefit from skilled PT to progress flexibility in lumbar and hip and for stabilization.  Patient goals include pain relief and strengthening to avoid future injuries.   Pt will benefit  from skilled therapeutic intervention in order to improve on the following deficits Decreased range of motion;Impaired flexibility;Improper body mechanics;Decreased activity tolerance;Decreased strength;Pain;Increased muscle spasms   Rehab Potential Good   PT Frequency 2x / week   PT Duration 6 weeks   PT Treatment/Interventions ADLs/Self Care Home Management;Traction;Ultrasound;Passive range of motion;Patient/family education;Functional mobility training;Cryotherapy;Therapeutic activities;Manual techniques;Therapeutic exercise;Electrical Stimulation;Moist Heat   PT Next Visit Plan Review HEP and progress flexibility with piriformis, hamstring and SKTC and add all in written form to patient.  Modalities prn for pain/stiffness, bike vs nustep for warm up   Consulted and Agree with Plan of Care Patient          G-Codes - July 12, 2014 1005    Functional Assessment Tool Used FOTO   Functional Limitation Mobility: Walking and moving around   Mobility: Walking and Moving Around Current Status [phone]) At least 60 percent but less than 80 percent impaired, limited or restricted   Mobility: Walking and Moving Around Goal Status [phone]) At least 40 percent but less than 60 percent impaired, limited or restricted       Problem List Patient Active Problem List   Diagnosis Date Noted  . 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 Left knee pain 05/11/2014  .  Sciatica 04/22/2014  . Osteoarthritis of right knee 02/06/2014  . Allergy to mold 12/13/2013  . Weight gain, abnormal 11/08/2013  . PTSD (post-traumatic stress disorder) 07/05/2013  . Lumbar strain 02/05/2013  . Multinodular goiter 01/19/2013  . Heel pain 11/17/2012  . GERD (gastroesophageal reflux disease) 08/09/2012  . Hypokalemia 12/07/2011  . Gastritis 11/30/2011  . Neck pain 11/12/2011  . Fatty liver 08/06/2011  . Multiple thyroid nodules 08/06/2011  . Heme positive stool 07/28/2011  . Cervical disc disease 07/28/2011  . Preventative health care  07/28/2011  . Erectile dysfunction 05/23/2011  . Elevated liver function tests 05/20/2011  . DM2 (diabetes mellitus, type 2) 05/19/2011  . HTN (hypertension) 05/19/2011  . Hyperlipidemia 05/19/2011  . Morbid obesity 05/19/2011  . Allergic rhinitis 05/19/2011  . OSA (obstructive sleep apnea) 05/19/2011  . Right knee pain 01/17/2011    Caley Volkert,CYNDI 06/14/2014, 11:00 AM  Magda Kiel, PT  [facility] [address]  [place], [place], [address] Phone: [phone]   Fax:  [phone]

## 2014-06-14 NOTE — Patient Instructions (Signed)
Educated on HEP verbally with demo for child's pose stretch in quadruped and sitting with hands out on table.  Educated in [place] and progression of therapy.

## 2014-06-15 ENCOUNTER — Encounter: Payer: Self-pay | Admitting: Family

## 2014-06-15 ENCOUNTER — Ambulatory Visit (HOSPITAL_BASED_OUTPATIENT_CLINIC_OR_DEPARTMENT_OTHER)
Admission: RE | Admit: 2014-06-15 | Discharge: 2014-06-15 | Disposition: A | Discharge status: Home / Self Care | Source: Ambulatory Visit | Attending: Family | Admitting: Family

## 2014-06-15 DIAGNOSIS — N5089 Other specified disorders of the male genital organs: Secondary | ICD-10-CM

## 2014-06-15 DIAGNOSIS — E042 Nontoxic multinodular goiter: Secondary | ICD-10-CM | POA: Insufficient documentation

## 2014-06-15 DIAGNOSIS — I861 Scrotal varices: Secondary | ICD-10-CM | POA: Diagnosis not present

## 2014-06-15 DIAGNOSIS — E010 Iodine-deficiency related diffuse (endemic) goiter: Secondary | ICD-10-CM | POA: Diagnosis present

## 2014-06-15 DIAGNOSIS — N492 Inflammatory disorders of scrotum: Secondary | ICD-10-CM | POA: Diagnosis not present

## 2014-06-16 ENCOUNTER — Telehealth: Payer: Self-pay | Admitting: Family

## 2014-06-16 ENCOUNTER — Encounter: Payer: Self-pay | Admitting: Family

## 2014-06-16 ENCOUNTER — Other Ambulatory Visit: Payer: Self-pay | Admitting: Family

## 2014-06-16 ENCOUNTER — Ambulatory Visit: Admitting: Rehabilitation

## 2014-06-16 MED ORDER — CANAGLIFLOZIN 100 MG PO TABS: 100.0000 mg | ORAL_TABLET | Freq: Every day | ORAL | Status: DC

## 2014-06-16 MED ORDER — ATORVASTATIN CALCIUM 10 MG PO TABS: 10.0000 mg | ORAL_TABLET | Freq: Every day | ORAL | Status: DC

## 2014-06-16 NOTE — Telephone Encounter (Signed)
Caller name: Zakk, Borgen Relation to pt: self  Call back number: [phone]   Reason for call:  Pt requesting TB vaccination record fax to job fax# [phone]

## 2014-06-16 NOTE — Telephone Encounter (Signed)
[phone]  Pt came in office stating needing referral for Invokana, Pt was informed will be processed and will be informed when done. Pt also states while waiting for respond if he can continue with Juaniva until Invokana is approved. Please advise.

## 2014-06-16 NOTE — Telephone Encounter (Signed)
Notified pt and he voices understanding. Rxs sent to [place].

## 2014-06-16 NOTE — Telephone Encounter (Signed)
Pt aware of labs below. States he has been off Januvia due to hypoglycemia, metformin 1000mg  bid caused diarrhea and byetta was stopped due to fatty liver. Will notify pt once PA is completed for Invokana.

## 2014-06-16 NOTE — Telephone Encounter (Signed)
Continue Charles Nash if he has been taking. Add invokana. PSA normal, urine, blood count, liver thyroid look good. Cholesterol slightly above goal.  Add lipitor 10mg  once daily.

## 2014-06-16 NOTE — Telephone Encounter (Signed)
Sugar is uncontrolled. Start invokana- Chartered certified accountant. Work on [employer], exercise, weight loss.  Which pharmacy does he want it to go to?  Start atorvastatin for cholesterol.

## 2014-06-16 NOTE — Telephone Encounter (Signed)
Last PPD in chart is from 2013. Spoke with pt and he requests that we discard request. He is wanting to know his recent test results. States he was informed of his thyroid and u/s but not remaining results. Please advise.

## 2014-06-17 ENCOUNTER — Ambulatory Visit (HOSPITAL_BASED_OUTPATIENT_CLINIC_OR_DEPARTMENT_OTHER)
Admission: RE | Admit: 2014-06-17 | Discharge: 2014-06-17 | Disposition: A | Discharge status: Home / Self Care | Source: Ambulatory Visit | Attending: Family Medicine | Admitting: Family Medicine

## 2014-06-17 DIAGNOSIS — M25562 Pain in left knee: Secondary | ICD-10-CM | POA: Diagnosis not present

## 2014-06-18 ENCOUNTER — Other Ambulatory Visit: Payer: Self-pay | Admitting: Family

## 2014-06-19 ENCOUNTER — Encounter (HOSPITAL_COMMUNITY): Payer: Self-pay | Admitting: Emergency Medicine

## 2014-06-19 ENCOUNTER — Emergency Department (HOSPITAL_COMMUNITY)
Admission: EM | Admit: 2014-06-19 | Discharge: 2014-06-19 | Disposition: A | Discharge status: Home / Self Care | Attending: Emergency Medicine | Admitting: Emergency Medicine

## 2014-06-19 DIAGNOSIS — K219 Gastro-esophageal reflux disease without esophagitis: Secondary | ICD-10-CM | POA: Diagnosis not present

## 2014-06-19 DIAGNOSIS — Z7982 Long term (current) use of aspirin: Secondary | ICD-10-CM | POA: Insufficient documentation

## 2014-06-19 DIAGNOSIS — R112 Nausea with vomiting, unspecified: Secondary | ICD-10-CM | POA: Diagnosis present

## 2014-06-19 DIAGNOSIS — Z79899 Other long term (current) drug therapy: Secondary | ICD-10-CM | POA: Insufficient documentation

## 2014-06-19 DIAGNOSIS — Z8669 Personal history of other diseases of the nervous system and sense organs: Secondary | ICD-10-CM | POA: Insufficient documentation

## 2014-06-19 DIAGNOSIS — K529 Noninfective gastroenteritis and colitis, unspecified: Secondary | ICD-10-CM | POA: Diagnosis not present

## 2014-06-19 DIAGNOSIS — E785 Hyperlipidemia, unspecified: Secondary | ICD-10-CM | POA: Diagnosis not present

## 2014-06-19 DIAGNOSIS — E119 Type 2 diabetes mellitus without complications: Secondary | ICD-10-CM | POA: Diagnosis not present

## 2014-06-19 DIAGNOSIS — Z86010 Personal history of colonic polyps: Secondary | ICD-10-CM | POA: Diagnosis not present

## 2014-06-19 DIAGNOSIS — R197 Diarrhea, unspecified: Secondary | ICD-10-CM

## 2014-06-19 DIAGNOSIS — Z8659 Personal history of other mental and behavioral disorders: Secondary | ICD-10-CM | POA: Diagnosis not present

## 2014-06-19 DIAGNOSIS — Z7952 Long term (current) use of systemic steroids: Secondary | ICD-10-CM | POA: Insufficient documentation

## 2014-06-19 DIAGNOSIS — I10 Essential (primary) hypertension: Secondary | ICD-10-CM | POA: Diagnosis not present

## 2014-06-19 DIAGNOSIS — M1990 Unspecified osteoarthritis, unspecified site: Secondary | ICD-10-CM | POA: Insufficient documentation

## 2014-06-19 LAB — URINALYSIS, ROUTINE W REFLEX MICROSCOPIC
Bilirubin Urine: NEGATIVE
GLUCOSE, UA: NEGATIVE mg/dL
Hgb urine dipstick: NEGATIVE
Ketones, ur: NEGATIVE mg/dL
LEUKOCYTES UA: NEGATIVE
Nitrite: NEGATIVE
PH: 6.5 (ref 5.0–8.0)
Protein, ur: 100 mg/dL — AB
Specific Gravity, Urine: 1.024 (ref 1.005–1.030)
Urobilinogen, UA: 0.2 mg/dL (ref 0.0–1.0)

## 2014-06-19 LAB — CBC WITH DIFFERENTIAL/PLATELET
BASOS PCT: 0 % (ref 0–1)
Basophils Absolute: 0 10*3/uL (ref 0.0–0.1)
EOS PCT: 0 % (ref 0–5)
Eosinophils Absolute: 0 10*3/uL (ref 0.0–0.7)
HCT: 48 % (ref 39.0–52.0)
HEMOGLOBIN: 16.3 g/dL (ref 13.0–17.0)
Lymphocytes Relative: 7 % — ABNORMAL LOW (ref 12–46)
Lymphs Abs: 0.5 10*3/uL — ABNORMAL LOW (ref 0.7–4.0)
MCH: 29.6 pg (ref 26.0–34.0)
MCHC: 34 g/dL (ref 30.0–36.0)
MCV: 87.3 fL (ref 78.0–100.0)
MONO ABS: 0.4 10*3/uL (ref 0.1–1.0)
MONOS PCT: 5 % (ref 3–12)
Neutro Abs: 6.9 10*3/uL (ref 1.7–7.7)
Neutrophils Relative %: 88 % — ABNORMAL HIGH (ref 43–77)
Platelets: 276 10*3/uL (ref 150–400)
RBC: 5.5 MIL/uL (ref 4.22–5.81)
RDW: 13.6 % (ref 11.5–15.5)
WBC: 7.9 10*3/uL (ref 4.0–10.5)

## 2014-06-19 LAB — COMPREHENSIVE METABOLIC PANEL
ALBUMIN: 4.9 g/dL (ref 3.5–5.2)
ALK PHOS: 73 U/L (ref 39–117)
ALT: 44 U/L (ref 0–53)
AST: 43 U/L — ABNORMAL HIGH (ref 0–37)
Anion gap: 8 (ref 5–15)
BUN: 11 mg/dL (ref 6–23)
CO2: 25 mmol/L (ref 19–32)
Calcium: 9.2 mg/dL (ref 8.4–10.5)
Chloride: 104 mmol/L (ref 96–112)
Creatinine, Ser: 1 mg/dL (ref 0.50–1.35)
GFR calc Af Amer: >90 mL/min (ref >90)
GFR calc non Af Amer: 88 mL/min — ABNORMAL LOW (ref >90)
GLUCOSE: 139 mg/dL — AB (ref 70–99)
POTASSIUM: 3.7 mmol/L (ref 3.5–5.1)
Sodium: 137 mmol/L (ref 135–145)
TOTAL PROTEIN: 8.7 g/dL — AB (ref 6.0–8.3)
Total Bilirubin: 1.4 mg/dL — ABNORMAL HIGH (ref 0.3–1.2)

## 2014-06-19 LAB — URINE MICROSCOPIC-ADD ON

## 2014-06-19 LAB — I-STAT TROPONIN, ED: Troponin i, poc: 0 ng/mL (ref 0.00–0.08)

## 2014-06-19 MED ORDER — SODIUM CHLORIDE 0.9 % IV BOLUS (SEPSIS)
500.0000 mL | Freq: Once | INTRAVENOUS | Status: AC
  Administered 2014-06-19: 500 mL via INTRAVENOUS

## 2014-06-19 MED ORDER — ONDANSETRON 4 MG PO TBDP: 4.0000 mg | ORAL_TABLET | Freq: Three times a day (TID) | ORAL | Status: DC | PRN

## 2014-06-19 MED ORDER — TRAMADOL HCL 50 MG PO TABS: 50.0000 mg | ORAL_TABLET | Freq: Four times a day (QID) | ORAL | Status: DC | PRN

## 2014-06-19 MED ORDER — ONDANSETRON HCL 4 MG/2ML IJ SOLN
4.0000 mg | Freq: Once | INTRAMUSCULAR | Status: AC
Start: 2014-06-19 — End: 2014-06-19
  Administered 2014-06-19: 4 mg via INTRAVENOUS
  Filled 2014-06-19: qty 2

## 2014-06-19 NOTE — Assessment & Plan Note (Signed)
surprisingly has minimal DJD of this knee.  Injection did not provide much benefit.  Will go ahead with MRI to assess for degenerative meniscus tear.

## 2014-06-19 NOTE — Progress Notes (Addendum)
Patient ID: Charles Nash, male   DOB: [birth year], 49 y.o.   MRN: [id number]  49 yo M here for left knee pain   01/17/11:  Patient with history of two knee arthroscopies for meniscal debridements.  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  Some swelling.  Has grinding and catching anterior and medial knee.  Has iced knee but not taking any pain medicine.  Has h/o cortisone injections in past but none in past year or so.   10/19:  Patient did not have pain improvement with cortisone injection.  Was examined in training room at school and he would like to move forward with synvisc injections for his DJD of right knee.  No change in symptoms from last visit.   10/26:  Patient did well with first synvisc injection -- here today for second one.  Had good pain relief even after first injection though some soreness now.   02/14/11:  Overall doing well - actually having superficial knee pain over patellar tendon and kneecap. No joint line pain now.  Here for third synvisc injection.   02/06/14: Patient reports his right knee pain has worsened over past couple months and very bad past week. Increased swelling. Pain mainly medial. No catching, locking, giving out. Did well with supartz series and had approval to do this again.  02/13/14: Patient returns for second supartz injection. Has had some improvement.  11/18: Patient returns for third supartz injection. Some improvement though pain still 6/10 level.  05/09/14: Patient reports both knees bothering him - left knee hurting more now at 4/10 level - has not had injections in this knee, no prior x-rays. Right knee still 3/10 - would like to do 1 or 2 more supartz injections. Left knee feels like it is coming out of place at times, feels unstable and may  give out.  05/11/14: Patient returns for supartz injection of right knee, cortisone injection of left knee.  3/2: Patient reports cortisone injection didn't help left knee much. No new injuries or trauma. No swelling. No catching or locking but feels unstable.  Past Medical History  Diagnosis Date  . Hyperlipidemia   . Diabetes mellitus   . Hypertension   . OSA (obstructive sleep apnea)   . Arthritis   . Allergy   . Personal history of colonic polyps   . GERD (gastroesophageal reflux disease)   . Fatty liver 08/06/2011  . Bilateral varicoceles   . Plantar fasciitis   . ADHD (attention deficit hyperactivity disorder)   . PTSD (post-traumatic stress disorder)     Current Facility-Administered Medications on File Prior to Visit  Medication Dose Route Frequency Provider Last Rate Last Dose  . triamcinolone acetonide (KENALOG) 10 MG/ML injection 10 mg  10 mg Other Once Harriet Masson, DPM       Current Outpatient Prescriptions on File Prior to Visit  Medication Sig Dispense Refill  . aspirin 81 MG tablet Take 1 tablet (81 mg total) by mouth daily. 14 tablet 0  . Blood Glucose Monitoring Suppl (FREESTYLE LITE) DEVI Use to check blood sugar once a day.  DX 250.00 (Patient not taking: Reported on 06/19/2014) 1 each 0  . cetirizine (ZYRTEC) 10 MG tablet Take 1 tablet (10 mg total) by mouth daily. (Patient not taking: Reported on 06/19/2014) 30 tablet  11  . clomiPHENE (CLOMID) 50 MG tablet Take 50 mg by mouth daily.    . furosemide (LASIX) 20 MG tablet One tab by mouth once daily as needed for swelling (Patient not taking: Reported on 06/13/2014)    . glucose blood (FREESTYLE LITE) test strip Use as instructed to check blood sugar once a day.  DX E11.9 100 each 1  . hydrALAZINE (APRESOLINE) 50 MG tablet TAKE 1 TABLET TWICE A DAY (Patient taking differently: TAKE 1 TABLET daily) 180 tablet 1  . hydrocortisone 2.5 % lotion Apply topically 2 (two) times daily. (Patient not taking: Reported on  06/19/2014) 59 mL 0  . Lancets (FREESTYLE) lancets Use to check blood sugar once a day.  DX E11.9 100 each 1  . metoprolol succinate (TOPROL-XL) 100 MG 24 hr tablet TAKE 1 TABLET DAILY WITH OR IMMEDIATELY FOLLOWING A MEAL 90 tablet 1  . niacin (NIASPAN) 500 MG CR tablet TAKE 1 TABLET (500 MG TOTAL) BY MOUTH AT BEDTIME. (Patient not taking: Reported on 06/13/2014) 90 tablet 1  . Omega-3 Fatty Acids (FISH OIL) 1000 MG CAPS Take 2 capsules (2,000 mg total) by mouth 2 (two) times daily.  0  . potassium chloride SA (K-DUR,KLOR-CON) 20 MEQ tablet Take 1 tablet (20 mEq total) by mouth daily. 30 tablet 3  . sildenafil (VIAGRA) 100 MG tablet Take 1/2 to 1 tablet daily as needed 15 tablet 0  . traMADol (ULTRAM) 50 MG tablet Take 1 tablet (50 mg total) by mouth every 8 (eight) hours as needed. (Patient not taking: Reported on 06/14/2014) 30 tablet 0  . TRIBENZOR 40-10-25 MG TABS TAKE 1 TABLET DAILY 90 tablet 1  . zolpidem (AMBIEN) 10 MG tablet Take 1 tablet (10 mg total) by mouth at bedtime as needed. 90 tablet 0  . [DISCONTINUED] potassium chloride (K-DUR) 10 MEQ tablet Take 1 tablet (10 mEq total) by mouth daily. 30 tablet 1    Past Surgical History  Procedure Laterality Date  . Knee surgery  08/04/08    Right knee-- medial meniscus repair, attenuation anterior cruciate [name] MD [facility])   . Ankle surgery    . Foot surgery    . Shoulder surgery    . Varicocele excision    . Plantar fascia release      Allergies  Allergen Reactions  . Oxycodone-Acetaminophen Hives and Itching    History   Social History  . Marital Status: Married    Spouse Name: N/A  . Number of Children: 2  . Years of Education: N/A   Occupational History  . FOOTBALL COACH    Social History Main Topics  . Smoking status: Never Smoker   . Smokeless tobacco: Never Used  . Alcohol Use: No     Comment: occasional wine  . Drug Use: No  . Sexual Activity: Not on file   Other Topics Concern  . Not on  file   Social History Narrative   Regular exercise:  No   Caffeine use:  2--32oz cups daily          Family History  Problem Relation Age of Onset  . Diabetes Mother   . Hypertension Mother   . Arthritis Mother   . Cancer Mother     uterine and breast cancer  . Hyperlipidemia Father   . Arthritis Father   . Cancer Father     thyroid, prostate cancer  . Other Father     mass in stomach  . Hyperlipidemia Maternal Grandmother   .  Hypertension Maternal Grandmother   . Hyperlipidemia Maternal Grandfather   . Hypertension Maternal Grandfather   . Hyperlipidemia Paternal Grandmother   . Hypertension Paternal Grandmother   . Hyperlipidemia Paternal Grandfather   . Hypertension Paternal Grandfather   . Heart attack Paternal Grandfather   . Sudden death Neg Hx   . Colon cancer Neg Hx   . Esophageal cancer Neg Hx   . Stomach cancer Neg Hx   . Rectal cancer Neg Hx     BP 149/83 mmHg  Pulse 87  Ht 6\' 1"  (1.854 m)  Wt 325 lb (147.419 kg)  BMI 42.89 kg/m2  Review of Systems: See HPI above.    Objective:  Physical Exam:  Gen: NAD  L knee: No gross deformity, ecchymoses, effusion. TTP lateral joint line > medial joint line. FROM. Negative ant/post drawers. Negative valgus/varus testing. Negative lachmanns. Negative mcmurrays, apleys, patellar apprehension. NV intact distally.     Assessment & Plan:   1. Left knee pain - surprisingly has minimal DJD of this knee.  Injection did not provide much benefit.  Will go ahead with MRI to assess for degenerative meniscus tear.  Addendum:  MRI reviewed and discussed with patient.  No evidence of meniscus tear. He does have moderate patellofemoral DJD and mild other compartments.  Discussed options and will go ahead with supartz series.

## 2014-06-19 NOTE — ED Notes (Signed)
Pt alert, oriented, and ambulatory upon DC. He is tolerating PO fluids well and requesting ginger ale upon DC. He was advised to follow up with PCP is one week.

## 2014-06-19 NOTE — ED Provider Notes (Signed)
CSN: [id number]     Arrival date & time 06/19/14  3734 History   First MD Initiated Contact with Patient 06/19/14 0945     Chief Complaint  Patient presents with  . Emesis     (Consider location/radiation/quality/duration/timing/severity/associated sxs/prior Treatment) HPI Comments: Charles Nash is a 49 y.o. male with a PMHx of HTN, HLD, DM2, OSA, arthritis, colonic polyps, GERD, fatty liver disease, varicoceles (b/l), plantar fasciitis, ADHD, and PTSD, who presents to the ED with complaints of nausea, vomiting, diarrhea, chills, and intermittent abdominal pain that began around 2 AM. He reports that he has had 8 episodes of nonbloody nonbilious emesis consisting of stomach contents, and 10 episodes of watery nonbloody diarrhea. He endorses positive sick contacts at home stating that multiple family members have been ill with the exact same symptoms. He states his abdominal pain is 4/10 epigastric sharp intermittent nonradiating pain with no known aggravating factors and unrelieved with Gatorade. He has not tried any medications prior to arrival. He is not currently experiencing any abdominal pain. Denies any fevers, chest pain, shortness of breath, cough, URI symptoms, hematochezia, melena, hematemesis, constipation, obstipation, dysuria, hematuria, testicular pain or swelling, penile discharge, numbness, tingling, weakness, rashes, EtOH use, travel, antibiotic use, or suspicious food intake. He does endorse taking NSAIDs frequently.  Patient is a 49 y.o. male presenting with vomiting. The history is provided by the patient. No language interpreter was used.  Emesis Severity:  Moderate Duration:  7 hours Timing:  Constant Number of daily episodes:  8x today Quality:  Stomach contents Progression:  Unchanged Chronicity:  New Recent urination:  Normal Relieved by:  None tried Worsened by:  Liquids Ineffective treatments:  Liquids Associated symptoms: abdominal pain (intermittent  epigastric abd pain, now resolved), chills and diarrhea   Associated symptoms: no arthralgias, no cough, no fever, no myalgias, no sore throat and no URI   Diarrhea:    Quality:  Watery   Number of occurrences:  ~10x   Severity:  Moderate   Duration:  7 hours   Timing:  Constant   Progression:  Unchanged Risk factors: diabetes and sick contacts   Risk factors: no suspect food intake and no travel to endemic areas     Past Medical History  Diagnosis Date  . Hyperlipidemia   . Diabetes mellitus   . Hypertension   . OSA (obstructive sleep apnea)   . Arthritis   . Allergy   . Personal history of colonic polyps   . GERD (gastroesophageal reflux disease)   . Fatty liver 08/06/2011  . Bilateral varicoceles   . Plantar fasciitis   . ADHD (attention deficit hyperactivity disorder)   . PTSD (post-traumatic stress disorder)    Past Surgical History  Procedure Laterality Date  . Knee surgery  08/04/08    Right knee-- medial meniscus repair, attenuation anterior cruciate [name] MD [facility])   . Ankle surgery    . Foot surgery    . Shoulder surgery    . Varicocele excision    . Plantar fascia release     Family History  Problem Relation Age of Onset  . Diabetes Mother   . Hypertension Mother   . Arthritis Mother   . Cancer Mother     uterine and breast cancer  . Hyperlipidemia Father   . Arthritis Father   . Cancer Father     thyroid, prostate cancer  . Other Father     mass in stomach  . Hyperlipidemia Maternal Grandmother   .  Hypertension Maternal Grandmother   . Hyperlipidemia Maternal Grandfather   . Hypertension Maternal Grandfather   . Hyperlipidemia Paternal Grandmother   . Hypertension Paternal Grandmother   . Hyperlipidemia Paternal Grandfather   . Hypertension Paternal Grandfather   . Heart attack Paternal Grandfather   . Sudden death Neg Hx   . Colon cancer Neg Hx   . Esophageal cancer Neg Hx   . Stomach cancer Neg Hx   . Rectal cancer  Neg Hx    History  Substance Use Topics  . Smoking status: Never Smoker   . Smokeless tobacco: Never Used  . Alcohol Use: No     Comment: occasional wine    Review of Systems  Constitutional: Positive for chills. Negative for fever.  HENT: Negative for sore throat.   Respiratory: Negative for shortness of breath.   Cardiovascular: Negative for chest pain.  Gastrointestinal: Positive for nausea, vomiting, abdominal pain (intermittent epigastric abd pain, now resolved) and diarrhea. Negative for constipation and blood in stool.  Genitourinary: Negative for dysuria, hematuria, flank pain, discharge, scrotal swelling, penile pain and testicular pain.  Musculoskeletal: Negative for myalgias, back pain and arthralgias.  Skin: Negative for color change.  Neurological: Negative for weakness and numbness.  Psychiatric/Behavioral: Negative for confusion.   10 Systems reviewed and are negative for acute change except as noted in the HPI.    Allergies  Oxycodone-acetaminophen  Home Medications   Prior to Admission medications   Medication Sig Start Date End Date Taking? Authorizing Provider  aspirin 81 MG tablet Take 1 tablet (81 mg total) by mouth daily. 05/12/14   Debbrah Alar, NP  atorvastatin (LIPITOR) 10 MG tablet Take 1 tablet (10 mg total) by mouth daily. 06/16/14   Debbrah Alar, NP  Blood Glucose Monitoring Suppl (FREESTYLE LITE) DEVI Use to check blood sugar once a day.  DX 250.00 11/18/13   Debbrah Alar, NP  canagliflozin (INVOKANA) 100 MG TABS tablet Take 1 tablet (100 mg total) by mouth daily. 06/16/14   Debbrah Alar, NP  cetirizine (ZYRTEC) 10 MG tablet Take 1 tablet (10 mg total) by mouth daily. 02/02/12   Debbrah Alar, NP  clomiPHENE (CLOMID) 50 MG tablet Take 50 mg by mouth daily.    Historical Provider, MD  furosemide (LASIX) 20 MG tablet One tab by mouth once daily as needed for swelling Patient not taking: Reported on 06/13/2014 03/01/14   Debbrah Alar, NP  glucose blood (FREESTYLE LITE) test strip Use as instructed to check blood sugar once a day.  DX E11.9 Patient not taking: Reported on 06/13/2014 04/21/14   Debbrah Alar, NP  hydrALAZINE (APRESOLINE) 50 MG tablet TAKE 1 TABLET TWICE A DAY Patient taking differently: TAKE 1 TABLET daily 12/26/13   Debbrah Alar, NP  hydrocortisone 2.5 % lotion Apply topically 2 (two) times daily. 07/16/13   Varney Biles, MD  Lancets (FREESTYLE) lancets Use to check blood sugar once a day.  DX E11.9 04/21/14   Debbrah Alar, NP  metoprolol succinate (TOPROL-XL) 100 MG 24 hr tablet TAKE 1 TABLET DAILY WITH OR IMMEDIATELY FOLLOWING A MEAL 05/19/14   Debbrah Alar, NP  niacin (NIASPAN) 500 MG CR tablet TAKE 1 TABLET (500 MG TOTAL) BY MOUTH AT BEDTIME. Patient not taking: Reported on 06/13/2014 02/23/14   Debbrah Alar, NP  Omega-3 Fatty Acids (FISH OIL) 1000 MG CAPS Take 2 capsules (2,000 mg total) by mouth 2 (two) times daily. 07/06/13   Debbrah Alar, NP  omeprazole (PRILOSEC) 20 MG capsule TAKE 2 CAPSULES  EVERY MORNING 06/18/14   Debbrah Alar, NP  potassium chloride SA (K-DUR,KLOR-CON) 20 MEQ tablet Take 1 tablet (20 mEq total) by mouth daily. 03/02/14   Debbrah Alar, NP  sildenafil (VIAGRA) 100 MG tablet Take 1/2 to 1 tablet daily as needed 03/30/13   Debbrah Alar, NP  sitaGLIPtin (JANUVIA) 100 MG tablet Take 100 mg by mouth daily.    Historical Provider, MD  traMADol (ULTRAM) 50 MG tablet Take 1 tablet (50 mg total) by mouth every 8 (eight) hours as needed. Patient not taking: Reported on 06/14/2014 04/19/14   Brunetta Jeans, PA-C  TRIBENZOR 40-10-25 MG TABS TAKE 1 TABLET DAILY 12/26/13   Debbrah Alar, NP  zolpidem (AMBIEN) 10 MG tablet Take 1 tablet (10 mg total) by mouth at bedtime as needed. 04/21/14   Mosie Lukes, MD   BP 149/90 mmHg  Pulse 81  Temp(Src) 98.8 F (37.1 C) (Oral)  Resp 19  SpO2 99% Physical Exam  Constitutional: He is oriented to  person, place, and time. Vital signs are normal. He appears well-developed and well-nourished.  Non-toxic appearance. He appears distressed (appears uncomfortable).  Afebrile, nontoxic, appears uncomfortable/nauseated  HENT:  Head: Normocephalic and atraumatic.  Mouth/Throat: Oropharynx is clear and moist. Mucous membranes are dry.  Mildly dry mucous membranes  Eyes: Conjunctivae and EOM are normal. Right eye exhibits no discharge. Left eye exhibits no discharge.  Neck: Normal range of motion. Neck supple.  Cardiovascular: Normal rate, regular rhythm, normal heart sounds and intact distal pulses.  Exam reveals no gallop and no friction rub.   No murmur heard. Pulmonary/Chest: Effort normal and breath sounds normal. No respiratory distress. He has no decreased breath sounds. He has no wheezes. He has no rhonchi. He has no rales.  Abdominal: Soft. Normal appearance and bowel sounds are normal. He exhibits no distension. There is no tenderness. There is no rigidity, no rebound, no guarding, no CVA tenderness, no tenderness at McBurney's point and negative Murphy's sign.  Soft, NTND, +BS throughout, no r/g/r, neg murphy's, neg mcburney's, no CVA TTP   Musculoskeletal: Normal range of motion.  Neurological: He is alert and oriented to person, place, and time. He has normal strength. No sensory deficit.  Skin: Skin is warm, dry and intact. No rash noted.  Psychiatric: He has a normal mood and affect.  Nursing note and vitals reviewed.   ED Course  Procedures (including critical care time) Labs Review Labs Reviewed  CBC WITH DIFFERENTIAL/PLATELET - Abnormal; Notable for the following:    Neutrophils Relative % 88 (*)    Lymphocytes Relative 7 (*)    Lymphs Abs 0.5 (*)    All other components within normal limits  COMPREHENSIVE METABOLIC PANEL - Abnormal; Notable for the following:    Glucose, Bld 139 (*)    Total Protein 8.7 (*)    AST 43 (*)    Total Bilirubin 1.4 (*)    GFR calc non Af  Amer 88 (*)    All other components within normal limits  URINALYSIS, ROUTINE W REFLEX MICROSCOPIC - Abnormal; Notable for the following:    Color, Urine AMBER (*)    APPearance CLOUDY (*)    Protein, ur 100 (*)    All other components within normal limits  URINE MICROSCOPIC-ADD ON - Abnormal; Notable for the following:    Bacteria, UA FEW (*)    Casts HYALINE CASTS (*)    All other components within normal limits  LIPASE, BLOOD  I-STAT TROPOININ, ED  Imaging Review Mr Knee Left  Wo Contrast  06/18/2014   CLINICAL DATA:  Anterolateral knee pain with popping for 2 months. No acute injury or prior relevant surgery. Initial encounter.  EXAM: MRI OF THE LEFT KNEE WITHOUT CONTRAST  TECHNIQUE: Multiplanar, multisequence MR imaging of the knee was performed. No intravenous contrast was administered.  COMPARISON:  Radiographs 05/09/2014.  FINDINGS: MENISCI  Medial meniscus:  Intact with normal morphology.  Lateral meniscus:  Intact with normal morphology.  LIGAMENTS  Cruciates: Intact. There is mild cruciate ligament mucoid degeneration with intercondylar notch cyst formation. There is also intraosseous cyst formation centrally in the proximal tibia.  Collaterals:  Intact.  Trace fluid in the pes anserine bursa.  CARTILAGE  Patellofemoral: Moderate patellofemoral degenerative changes with chondral thinning and subchondral cyst formation in the lateral patellar facet. There is mild lateral tilting of the patella.  Medial:  Relatively preserved with minimal osteophyte formation.  Lateral:  Relatively preserved with minimal osteophyte formation.  Joint:  Small joint effusion.  No evidence of loose body.  Popliteal Fossa:  Unremarkable. No significant Baker's cyst.  Extensor Mechanism: Intact. There is diffuse patellar tendinosis with prominent prepatellar and infrapatellar edema and ill-defined fluid.  Bones:  No significant extra-articular osseous findings.  IMPRESSION: 1. Prominent edema and ill-defined  fluid within the prepatellar and infrapatellar fat most consistent with bursitis. Mild associated patellar tendinosis. 2. Moderate patellofemoral degenerative changes. Minimal chondral thinning in the medial and lateral compartments. No acute osseous findings. 3. Intact menisci, cruciate and collateral ligaments. Possible mild cruciate ligament mucoid degeneration.   Electronically Signed   By: Richardean Sale M.D.   On: 06/18/2014 13:49     EKG Interpretation None      Date: 06/19/2014  Rate: 99  Rhythm: normal sinus rhythm  QRS Axis: right  Intervals: normal  ST/T Wave abnormalities: nonspecific ST/T changes  Conduction Disutrbances:none  Narrative Interpretation:   Old EKG Reviewed: none available    MDM   Final diagnoses:  Non-intractable vomiting with nausea, vomiting of unspecified type  Diarrhea  Acute gastroenteritis    49 y.o. male with n/v/d since 2am. +sick contacts at home with same symptoms. Abd exam without any tenderness. VSS. Will obtain labs, U/A, and give gentle fluids and zofran. Doubt need for imaging at this time, likely viral gastroenteritis. Will reassess shortly.   12:06 PM Pt feeling improved. EKG unremarkable. Trop neg. CBC w/diff showing mildly elevated AST but unconcerning. U/A appears somewhat contaminated vs dehydration. Lipase has not resulted yet. Pt needs to leave due to needing to get to an appt, therefore will PO challenge now and d/c home with zofran and small amount of pain meds. Doubt pancreatitis, will not wait on lipase level to return. Will send home with tramadol and zofran. Will have him f/up with PCP in 1wk. I explained the diagnosis and have given explicit precautions to return to the ER including for any other new or worsening symptoms. The patient understands and accepts the medical plan as it's been dictated and I have answered their questions. Discharge instructions concerning home care and prescriptions have been given. The patient is  STABLE and is discharged to home in good condition.   BP 147/86 mmHg  Pulse 87  Temp(Src) 98.8 F (37.1 C) (Oral)  Resp 17  SpO2 99%  Meds ordered this encounter  Medications  . sodium chloride 0.9 % bolus 500 mL    Sig:   . ondansetron (ZOFRAN) injection 4 mg    Sig:   .  ondansetron (ZOFRAN ODT) 4 MG disintegrating tablet    Sig: Take 1 tablet (4 mg total) by mouth every 8 (eight) hours as needed for nausea or vomiting.    Dispense:  15 tablet    Refill:  0    Order Specific Question:  Supervising Provider    Answer:  MILLER, BRIAN [[id number]]  . traMADol (ULTRAM) 50 MG tablet    Sig: Take 1 tablet (50 mg total) by mouth every 6 (six) hours as needed.    Dispense:  15 tablet    Refill:  0    Order Specific Question:  Supervising Provider    Answer:  Noemi Chapel [[id number]]     Keller Bounds Camprubi-Soms, PA-C 06/19/14 Cash, MD 06/20/14 [phone]

## 2014-06-19 NOTE — Discharge Instructions (Signed)
Use zofran as prescribed, as needed for nausea. Stay well hydrated with small sips of fluids throughout the day. Use tylenol or tramadol as needed for pain, but don't drive or operate machinery while taking tramadol. Follow a BRAT (banana-rice-applesauce-toast) diet as described below for the next 24-48 hours. The 'BRAT' diet is suggested, then progress to diet as tolerated as symptoms abate. Call if bloody stools, persistent diarrhea, vomiting, fever or abdominal pain. See your regular doctor in 1 week for recheck. Return to ER for changing or worsening of symptoms.  Food Choices to Help Relieve Diarrhea When you have diarrhea, the foods you eat and your eating habits are very important. Choosing the right foods and drinks can help relieve diarrhea. Also, because diarrhea can last up to 7 days, you need to replace lost fluids and electrolytes (such as sodium, potassium, and chloride) in order to help prevent dehydration.  WHAT GENERAL GUIDELINES DO I NEED TO FOLLOW?  Slowly drink 1 cup (8 oz) of fluid for each episode of diarrhea. If you are getting enough fluid, your urine will be clear or pale yellow.  Eat starchy foods. Some good choices include white rice, white toast, pasta, low-fiber cereal, baked potatoes (without the skin), saltine crackers, and bagels.  Avoid large servings of any cooked vegetables.  Limit fruit to two servings per day. A serving is  cup or 1 small piece.  Choose foods with less than 2 g of fiber per serving.  Limit fats to less than 8 tsp (38 g) per day.  Avoid fried foods.  Eat foods that have probiotics in them. Probiotics can be found in certain dairy products.  Avoid foods and beverages that may increase the speed at which food moves through the stomach and intestines (gastrointestinal tract). Things to avoid include:  High-fiber foods, such as dried fruit, raw fruits and vegetables, nuts, seeds, and whole grain foods.  Spicy foods and high-fat  foods.  Foods and beverages sweetened with high-fructose corn syrup, honey, or sugar alcohols such as xylitol, sorbitol, and mannitol. WHAT FOODS ARE RECOMMENDED? Grains White rice. White, [place], or pita breads (fresh or toasted), including plain rolls, buns, or bagels. White pasta. Saltine, soda, or graham crackers. Pretzels. Low-fiber cereal. Cooked cereals made with water (such as cornmeal, farina, or cream cereals). Plain muffins. Matzo. Melba toast. Zwieback.  Vegetables Potatoes (without the skin). Strained tomato and vegetable juices. Most well-cooked and canned vegetables without seeds. Tender lettuce. Fruits Cooked or canned applesauce, apricots, cherries, fruit cocktail, grapefruit, peaches, pears, or plums. Fresh bananas, apples without skin, cherries, grapes, cantaloupe, grapefruit, peaches, oranges, or plums.  Meat and Other Protein Products Baked or boiled chicken. Eggs. Tofu. Fish. Seafood. Smooth peanut butter. Ground or well-cooked tender beef, ham, veal, lamb, pork, or poultry.  Dairy Plain yogurt, kefir, and unsweetened liquid yogurt. Lactose-free milk, buttermilk, or soy milk. Plain hard cheese. Beverages Sport drinks. Clear broths. Diluted fruit juices (except prune). Regular, caffeine-free sodas such as ginger ale. Water. Decaffeinated teas. Oral rehydration solutions. Sugar-free beverages not sweetened with sugar alcohols. Other Bouillon, broth, or soups made from recommended foods.  The items listed above may not be a complete list of recommended foods or beverages. Contact your dietitian for more options. WHAT FOODS ARE NOT RECOMMENDED? Grains Whole grain, whole wheat, bran, or rye breads, rolls, pastas, crackers, and cereals. Wild or brown rice. Cereals that contain more than 2 g of fiber per serving. Corn tortillas or taco shells. Cooked or dry oatmeal. Granola. Popcorn. Vegetables  Raw vegetables. Cabbage, broccoli, Brussels sprouts, artichokes, baked beans, beet  greens, corn, kale, legumes, peas, sweet potatoes, and yams. Potato skins. Cooked spinach and cabbage. Fruits Dried fruit, including raisins and dates. Raw fruits. Stewed or dried prunes. Fresh apples with skin, apricots, mangoes, pears, raspberries, and strawberries.  Meat and Other Protein Products Chunky peanut butter. Nuts and seeds. Beans and lentils. Berniece Salines.  Dairy High-fat cheeses. Milk, chocolate milk, and beverages made with milk, such as milk shakes. Cream. Ice cream. Sweets and Desserts Sweet rolls, doughnuts, and sweet breads. Pancakes and waffles. Fats and Oils Butter. Cream sauces. Margarine. Salad oils. Plain salad dressings. Olives. Avocados.  Beverages Caffeinated beverages (such as coffee, tea, soda, or energy drinks). Alcoholic beverages. Fruit juices with pulp. Prune juice. Soft drinks sweetened with high-fructose corn syrup or sugar alcohols. Other Coconut. Hot sauce. Chili powder. Mayonnaise. Gravy. Cream-based or milk-based soups.  The items listed above may not be a complete list of foods and beverages to avoid. Contact your dietitian for more information. WHAT SHOULD I DO IF I BECOME DEHYDRATED? Diarrhea can sometimes lead to dehydration. Signs of dehydration include dark urine and dry mouth and skin. If you think you are dehydrated, you should rehydrate with an oral rehydration solution. These solutions can be purchased at pharmacies, retail stores, or online.  Drink -1 cup (120-240 mL) of oral rehydration solution each time you have an episode of diarrhea. If drinking this amount makes your diarrhea worse, try drinking smaller amounts more often. For example, drink 1-3 tsp (5-15 mL) every 5-10 minutes.  A general rule for staying hydrated is to drink 1-2 L of fluid per day. Talk to your health care provider about the specific amount you should be drinking each day. Drink enough fluids to keep your urine clear or pale yellow. Document Released: 06/21/2003 Document  Revised: 04/05/2013 Document Reviewed: 02/21/2013 [facility] Patient Information 2015 [place], [place]. This information is not intended to replace advice given to you by your health care provider. Make sure you discuss any questions you have with your health care provider.   Viral Gastroenteritis Viral gastroenteritis is also known as stomach flu. This condition affects the stomach and intestinal tract. It can cause sudden diarrhea and vomiting. The illness typically lasts 3 to 8 days. Most people develop an immune response that eventually gets rid of the virus. While this natural response develops, the virus can make you quite ill. CAUSES  Many different viruses can cause gastroenteritis, such as rotavirus or noroviruses. You can catch one of these viruses by consuming contaminated food or water. You may also catch a virus by sharing utensils or other personal items with an infected person or by touching a contaminated surface. SYMPTOMS  The most common symptoms are diarrhea and vomiting. These problems can cause a severe loss of body fluids (dehydration) and a body salt (electrolyte) imbalance. Other symptoms may include:  Fever.  Headache.  Fatigue.  Abdominal pain. DIAGNOSIS  Your caregiver can usually diagnose viral gastroenteritis based on your symptoms and a physical exam. A stool sample may also be taken to test for the presence of viruses or other infections. TREATMENT  This illness typically goes away on its own. Treatments are aimed at rehydration. The most serious cases of viral gastroenteritis involve vomiting so severely that you are not able to keep fluids down. In these cases, fluids must be given through an intravenous line (IV). HOME CARE INSTRUCTIONS   Drink enough fluids to keep your urine clear or pale  yellow. Drink small amounts of fluids frequently and increase the amounts as tolerated.  Ask your caregiver for specific rehydration instructions.  Avoid:  Foods high  in sugar.  Alcohol.  Carbonated drinks.  Tobacco.  Juice.  Caffeine drinks.  Extremely hot or cold fluids.  Fatty, greasy foods.  Too much intake of anything at one time.  Dairy products until 24 to 48 hours after diarrhea stops.  You may consume probiotics. Probiotics are active cultures of beneficial bacteria. They may lessen the amount and number of diarrheal stools in adults. Probiotics can be found in yogurt with active cultures and in supplements.  Wash your hands well to avoid spreading the virus.  Only take over-the-counter or prescription medicines for pain, discomfort, or fever as directed by your caregiver. Do not give aspirin to children. Antidiarrheal medicines are not recommended.  Ask your caregiver if you should continue to take your regular prescribed and over-the-counter medicines.  Keep all follow-up appointments as directed by your caregiver. SEEK IMMEDIATE MEDICAL CARE IF:   You are unable to keep fluids down.  You do not urinate at least once every 6 to 8 hours.  You develop shortness of breath.  You notice blood in your stool or vomit. This may look like coffee grounds.  You have abdominal pain that increases or is concentrated in one small area (localized).  You have persistent vomiting or diarrhea.  You have a fever.  The patient is a child younger than 3 months, and he or she has a fever.  The patient is a child older than 3 months, and he or she has a fever and persistent symptoms.  The patient is a child older than 3 months, and he or she has a fever and symptoms suddenly get worse.  The patient is a baby, and he or she has no tears when crying. MAKE SURE YOU:   Understand these instructions.  Will watch your condition.  Will get help right away if you are not doing well or get worse. Document Released: 03/31/2005 Document Revised: 06/23/2011 Document Reviewed: 01/15/2011 [facility] Patient Information 2015 [place], [place]. This  information is not intended to replace advice given to you by your health care provider. Make sure you discuss any questions you have with your health care provider.  Nausea and Vomiting Nausea means you feel sick to your stomach. Throwing up (vomiting) is a reflex where stomach contents come out of your mouth. HOME CARE   Take medicine as told by your doctor.  Do not force yourself to eat. However, you do need to drink fluids.  If you feel like eating, eat a normal diet as told by your doctor.  Eat rice, wheat, potatoes, bread, lean meats, yogurt, fruits, and vegetables.  Avoid high-fat foods.  Drink enough fluids to keep your pee (urine) clear or pale yellow.  Ask your doctor how to replace body fluid losses (rehydrate). Signs of body fluid loss (dehydration) include:  Feeling very thirsty.  Dry lips and mouth.  Feeling dizzy.  Dark pee.  Peeing less than normal.  Feeling confused.  Fast breathing or heart rate. GET HELP RIGHT AWAY IF:   You have blood in your throw up.  You have black or bloody poop (stool).  You have a bad headache or stiff neck.  You feel confused.  You have bad belly (abdominal) pain.  You have chest pain or trouble breathing.  You do not pee at least once every 8 hours.  You have cold,  clammy skin.  You keep throwing up after 24 to 48 hours.  You have a fever. MAKE SURE YOU:   Understand these instructions.  Will watch your condition.  Will get help right away if you are not doing well or get worse. Document Released: 09/17/2007 Document Revised: 06/23/2011 Document Reviewed: 08/30/2010 [facility] Patient Information 2015 [place], [place]. This information is not intended to replace advice given to you by your health care provider. Make sure you discuss any questions you have with your health care provider.

## 2014-06-19 NOTE — Telephone Encounter (Signed)
PA submitted via covermymeds. Awaiting approval / denial status.

## 2014-06-19 NOTE — ED Notes (Signed)
Per patient, cold symptoms vomiting since early this am-states virus going around his household

## 2014-06-19 NOTE — ED Notes (Signed)
Patient is in restroom trying to urinate

## 2014-06-20 ENCOUNTER — Encounter: Payer: Self-pay | Admitting: Family

## 2014-06-21 ENCOUNTER — Ambulatory Visit: Admitting: Physical Therapy

## 2014-06-21 ENCOUNTER — Other Ambulatory Visit: Payer: Self-pay | Admitting: Family

## 2014-06-23 MED ORDER — EMPAGLIFLOZIN 10 MG PO TABS: 10.0000 mg | ORAL_TABLET | Freq: Every day | ORAL | Status: DC

## 2014-06-23 NOTE — Telephone Encounter (Signed)
Instead I have sent jardiance.

## 2014-06-23 NOTE — Telephone Encounter (Signed)
Received denial from insurance for invokana.  Please advise.

## 2014-06-23 NOTE — Telephone Encounter (Signed)
Notified pt. He requests 90 day Rx to mail order. Rx sent.

## 2014-06-24 ENCOUNTER — Telehealth: Payer: Self-pay | Admitting: *Deleted

## 2014-06-24 NOTE — Telephone Encounter (Signed)
Received notice from [employer] that jardiance will require prior auth.  It appears that glyburide and glyburide micronized tablets are the only covered alternatives.  I do not see that pt has tried this before.  Please advise?

## 2014-06-25 MED ORDER — GLYBURIDE 2.5 MG PO TABS: 2.5000 mg | ORAL_TABLET | Freq: Every day | ORAL | Status: DC

## 2014-06-25 NOTE — Telephone Encounter (Signed)
Lab Results  Component Value Date   HGBA1C 7.2* 06/13/2014   OK, d/c jardiance start glyburide.

## 2014-06-26 ENCOUNTER — Ambulatory Visit: Admitting: Physical Therapy

## 2014-06-26 ENCOUNTER — Ambulatory Visit: Admitting: Family

## 2014-06-26 NOTE — Progress Notes (Deleted)
Pre visit review using our clinic review tool, if applicable. No additional management support is needed unless otherwise documented below in the visit note. 

## 2014-06-26 NOTE — Addendum Note (Signed)
Addended by: Dene Gentry on: 06/26/2014 09:54 AM   Modules accepted: Miquel Dunn

## 2014-06-26 NOTE — Telephone Encounter (Signed)
Notified pt and he voices understanding. 

## 2014-06-26 NOTE — Progress Notes (Signed)
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 Today's visit has been rescheduled for 06/28/14 at 7am.

## 2014-06-28 ENCOUNTER — Ambulatory Visit (INDEPENDENT_AMBULATORY_CARE_PROVIDER_SITE_OTHER): Admitting: Family

## 2014-06-28 ENCOUNTER — Encounter: Payer: Self-pay | Admitting: Family

## 2014-06-28 VITALS — BP 126/86 | HR 72 | Temp 98.1°F | Resp 16 | Ht 73.0 in | Wt 328.8 lb

## 2014-06-28 DIAGNOSIS — E119 Type 2 diabetes mellitus without complications: Secondary | ICD-10-CM

## 2014-06-28 DIAGNOSIS — I10 Essential (primary) hypertension: Secondary | ICD-10-CM

## 2014-06-28 MED ORDER — SITAGLIPTIN PHOSPHATE 100 MG PO TABS: 100.0000 mg | ORAL_TABLET | Freq: Every day | ORAL | Status: DC

## 2014-06-28 NOTE — Assessment & Plan Note (Signed)
D/c glyburide, restart [place].

## 2014-06-28 NOTE — Assessment & Plan Note (Signed)
Stable continue current meds. Form filled.

## 2014-06-28 NOTE — Progress Notes (Signed)
Pre visit review using our clinic review tool, if applicable. No additional management support is needed unless otherwise documented below in the visit note. 

## 2014-06-28 NOTE — Progress Notes (Signed)
Subjective:    Patient ID: Charles Nash, male    DOB: 1966-04-07, 49 y.o.   MRN: [id number]  HPI   Charles Nash is a 49 yr old male who presents today for BP recheck and form for DOT primary care clearance.  DM2-  Lost rx for glyburide, wants to return to [place].   Review of Systems     Past Medical History  Diagnosis Date  . Hyperlipidemia   . Diabetes mellitus   . Hypertension   . OSA (obstructive sleep apnea)   . Arthritis   . Allergy   . Personal history of colonic polyps   . GERD (gastroesophageal reflux disease)   . Fatty liver 08/06/2011  . Bilateral varicoceles   . Plantar fasciitis   . ADHD (attention deficit hyperactivity disorder)   . PTSD (post-traumatic stress disorder)     History   Social History  . Marital Status: Married    Spouse Name: N/A  . Number of Children: 2  . Years of Education: N/A   Occupational History  . FOOTBALL COACH    Social History Main Topics  . Smoking status: Never Smoker   . Smokeless tobacco: Never Used  . Alcohol Use: No     Comment: occasional wine  . Drug Use: No  . Sexual Activity: Not on file   Other Topics Concern  . Not on file   Social History Narrative   Regular exercise:  No   Caffeine use:  2--32oz cups daily          Past Surgical History  Procedure Laterality Date  . Knee surgery  08/04/08    Right knee-- medial meniscus repair, attenuation anterior cruciate [name] MD [facility])   . Ankle surgery    . Foot surgery    . Shoulder surgery    . Varicocele excision    . Plantar fascia release      Family History  Problem Relation Age of Onset  . Diabetes Mother   . Hypertension Mother   . Arthritis Mother   . Cancer Mother     uterine and breast cancer  . Hyperlipidemia Father   . Arthritis Father   . Cancer Father     thyroid, prostate cancer  . Other Father     mass in stomach  . Hyperlipidemia Maternal Grandmother   . Hypertension Maternal Grandmother   .  Hyperlipidemia Maternal Grandfather   . Hypertension Maternal Grandfather   . Hyperlipidemia Paternal Grandmother   . Hypertension Paternal Grandmother   . Hyperlipidemia Paternal Grandfather   . Hypertension Paternal Grandfather   . Heart attack Paternal Grandfather   . Sudden death Neg Hx   . Colon cancer Neg Hx   . Esophageal cancer Neg Hx   . Stomach cancer Neg Hx   . Rectal cancer Neg Hx     Allergies  Allergen Reactions  . Oxycodone-Acetaminophen Hives and Itching    Current Outpatient Prescriptions on File Prior to Visit  Medication Sig Dispense Refill  . aspirin 81 MG tablet Take 1 tablet (81 mg total) by mouth daily. 14 tablet 0  . atorvastatin (LIPITOR) 10 MG tablet Take 1 tablet (10 mg total) by mouth daily. 30 tablet 2  . Blood Glucose Monitoring Suppl (FREESTYLE LITE) DEVI Use to check blood sugar once a day.  DX 250.00 1 each 0  . cetirizine (ZYRTEC) 10 MG tablet Take 1 tablet (10 mg total) by mouth daily. 30 tablet 11  .  Cholecalciferol (VITAMIN D PO) Take 1 tablet by mouth daily.    . clomiPHENE (CLOMID) 50 MG tablet Take 50 mg by mouth daily.    Marland Kitchen glucose blood (FREESTYLE LITE) test strip Use as instructed to check blood sugar once a day.  DX E11.9 100 each 1  . hydrocortisone 2.5 % lotion Apply topically 2 (two) times daily. 59 mL 0  . Lancets (FREESTYLE) lancets Use to check blood sugar once a day.  DX E11.9 100 each 1  . metoprolol succinate (TOPROL-XL) 100 MG 24 hr tablet TAKE 1 TABLET DAILY WITH OR IMMEDIATELY FOLLOWING A MEAL 90 tablet 1  . Multiple Vitamin (MULTIVITAMIN WITH MINERALS) TABS tablet Take 1 tablet by mouth daily.    . niacin (NIASPAN) 500 MG CR tablet TAKE 1 TABLET (500 MG TOTAL) BY MOUTH AT BEDTIME. 90 tablet 1  . Omega-3 Fatty Acids (FISH OIL) 1000 MG CAPS Take 2 capsules (2,000 mg total) by mouth 2 (two) times daily.  0  . omeprazole (PRILOSEC) 20 MG capsule TAKE 2 CAPSULES EVERY MORNING 180 capsule 0  . ondansetron (ZOFRAN ODT) 4 MG  disintegrating tablet Take 1 tablet (4 mg total) by mouth every 8 (eight) hours as needed for nausea or vomiting. 15 tablet 0  . potassium chloride SA (K-DUR,KLOR-CON) 20 MEQ tablet Take 1 tablet (20 mEq total) by mouth daily. 30 tablet 3  . sildenafil (VIAGRA) 100 MG tablet Take 1/2 to 1 tablet daily as needed 15 tablet 0  . traMADol (ULTRAM) 50 MG tablet Take 1 tablet (50 mg total) by mouth every 8 (eight) hours as needed. 30 tablet 0  . TRIBENZOR 40-10-25 MG TABS TAKE 1 TABLET DAILY 90 tablet 1  . zolpidem (AMBIEN) 10 MG tablet Take 1 tablet (10 mg total) by mouth at bedtime as needed. 90 tablet 0  . [DISCONTINUED] potassium chloride (K-DUR) 10 MEQ tablet Take 1 tablet (10 mEq total) by mouth daily. 30 tablet 1   Current Facility-Administered Medications on File Prior to Visit  Medication Dose Route Frequency Provider Last Rate Last Dose  . triamcinolone acetonide (KENALOG) 10 MG/ML injection 10 mg  10 mg Other Once Richard Sikora, DPM        BP 126/86 mmHg  Pulse 72  Temp(Src) 98.1 F (36.7 C) (Oral)  Resp 16  Ht 6\' 1"  (1.854 m)  Wt 328 lb 12.8 oz (149.143 kg)  BMI 43.39 kg/m2  SpO2 96%    Objective:   Physical Exam  Constitutional: He is oriented to person, place, and time. He appears well-developed and well-nourished. No distress.  HENT:  Head: Normocephalic and atraumatic.  Cardiovascular: Normal rate and regular rhythm.   No murmur heard. Pulmonary/Chest: Effort normal and breath sounds normal. No respiratory distress. He has no wheezes. He has no rales.  Neurological: He is alert and oriented to person, place, and time.  Skin: Skin is warm and dry.  Psychiatric: He has a normal mood and affect. His behavior is normal. Thought content normal.          Assessment & Plan:

## 2014-06-28 NOTE — Patient Instructions (Signed)
Stop glyburide, start januvia.  Follow up in June.

## 2014-06-28 NOTE — Addendum Note (Signed)
Addended by: Debbrah Alar on: 06/28/2014 09:47 AM   Modules accepted: Miquel Dunn

## 2014-06-29 ENCOUNTER — Ambulatory Visit: Admitting: Physical Therapy

## 2014-06-30 ENCOUNTER — Telehealth: Payer: Self-pay | Admitting: *Deleted

## 2014-06-30 ENCOUNTER — Ambulatory Visit

## 2014-06-30 NOTE — Telephone Encounter (Signed)
Received call from [place] at [employer], he states Jardiance rx (06/25/14) that was previously denied by insurance went through today and pt picked up rx.  Last office noted on 06/28/14 pt was going to go back on Januvia. Left detailed message on pt's voicemail to continue Januvia and not to take jardiance until further notice.  I do not understand how Jardiance Rx went through insurance when I received PA request from covermymeds on 06/26/14. Please advise what pt should take?

## 2014-07-01 NOTE — Telephone Encounter (Signed)
Lets have him continue [place], do not start jardiance yet.  Will see how his A1C looks next visit.

## 2014-07-03 ENCOUNTER — Ambulatory Visit: Admitting: Physical Therapy

## 2014-07-03 NOTE — Telephone Encounter (Signed)
Spoke with PCP, she is ok with pt taking Jardiance only; stop Januvia. Left detailed message on pt's cell# and to call if any questions. Med list updated.

## 2014-07-03 NOTE — Telephone Encounter (Signed)
Spoke with pt. He states that he has also received a 90 day supply of Jardiance from [facility]. Pt wants to take Jardiance now since he has already paid for 4 months supply. Pt is aware not to take both Januvia and Jardiance and has not taken either medication this morning. Pt will wait to hear back from [place]. Please advise if ok to take Jardiance instead of Januvia.

## 2014-07-06 ENCOUNTER — Ambulatory Visit: Admitting: Physical Therapy

## 2014-07-06 DIAGNOSIS — M5442 Lumbago with sciatica, left side: Principal | ICD-10-CM

## 2014-07-06 DIAGNOSIS — M5441 Lumbago with sciatica, right side: Secondary | ICD-10-CM | POA: Diagnosis not present

## 2014-07-06 NOTE — Therapy (Signed)
[facility] [address]  [place], [place], [address] Phone: [phone]   Fax:  [phone]  Physical Therapy Treatment  Patient Details  Name: Charles Nash MRN: [id number] Date of Birth: 1966-02-16 Referring Provider:  Debbrah Alar, NP  Encounter Date: 07/06/2014      PT End of Session - 07/06/14 0911    Visit Number 2   Number of Visits 12   Date for PT Re-Evaluation 07/27/13   PT Start Time 6144   PT Stop Time 0953   PT Time Calculation (min) 58 min      Past Medical History  Diagnosis Date  . Hyperlipidemia   . Diabetes mellitus   . Hypertension   . OSA (obstructive sleep apnea)   . Arthritis   . Allergy   . Personal history of colonic polyps   . GERD (gastroesophageal reflux disease)   . Fatty liver 08/06/2011  . Bilateral varicoceles   . Plantar fasciitis   . ADHD (attention deficit hyperactivity disorder)   . PTSD (post-traumatic stress disorder)     Past Surgical History  Procedure Laterality Date  . Knee surgery  08/04/08    Right knee-- medial meniscus repair, attenuation anterior cruciate [name] MD [facility])   . Ankle surgery    . Foot surgery    . Shoulder surgery    . Varicocele excision    . Plantar fascia release      There were no vitals filed for this visit.  Visit Diagnosis:  Bilateral low back pain with sciatica, sciatica laterality unspecified      Subjective Assessment - 07/06/14 0905    Symptoms Pt participated in initial evaluation 3 weeks and has not been able to make his scheduled appointments since that time.  He states he has been going to chiropractor past 3-4 weeks 2x/wk with pt noting some benefit.  Reports noting intermittent N/T in B LE with prolonged sitting (rarely 1-2x/month) with most recent being 5 days ago.   Currently in Pain? Yes   Pain Score 3    Pain Location Back   Pain Orientation Lower   Pain Radiating Towards  sometimes extends up L lumbar and lower t-spine paraspinals   Multiple Pain Sites No          TODAY'S TREATMENT NuStep lvl 5, 4' Stretch B HS, Piri, and Prone Knee Flexino (very tight B RF with c/o LBP during stretch) Prone Alt SLR 10x each (notes some L LBP with R SLR) Pelvic Tilt 2x10x5" Bridge 2x10 HEP instruction  Mechanical Traction - Lumbar, Prone, Neutral pull, 70#/35#, 60"/20", 15'                    PT Education - 07/06/14 0937    Education provided Yes   Education Details HEP Progression   Person(s) Educated Patient   Methods Explanation;Demonstration;Handout   Comprehension Returned demonstration;Verbalized understanding          PT Short Term Goals - 07/06/14 0926    PT SHORT TERM GOAL #1   Title Patient to be independent in initial HEP for flexibility.  07/07/14   Status On-going   PT SHORT TERM GOAL #2   Title Patient to report pain no more than 4/10 with daily activities.  07/07/14   Status On-going   PT SHORT TERM GOAL #3   Title Patient to report tolerating 30 minutes walking prior to pain increase.  07/07/14   Status On-going  PT Long Term Goals - 07/06/14 8338    PT LONG TERM GOAL #1   Title Patient to be independent with advanced HEP for core strength and mobility.  07/28/14   Status On-going   PT LONG TERM GOAL #2   Status On-going   PT LONG TERM GOAL #3   Title Patient to report tolerating walking as needed for shopping/daily activities without pain increase.  07/28/14   Status On-going   PT LONG TERM GOAL #4   Title FOTO functional reporting improved with no more than 41% limitation.  07/28/14   Status On-going               Plan - 07/06/14 1007    Clinical Impression Statement Tolerated today's session well.  Did not experience LBP other than with prone knee flexion which typically indicates facet type pain.  B HS near normal flexibility and NEG SLR.   Pt will benefit from skilled therapeutic intervention  in order to improve on the following deficits Decreased range of motion;Impaired flexibility;Improper body mechanics;Decreased activity tolerance;Decreased strength;Pain;Increased muscle spasms   Rehab Potential Good   PT Treatment/Interventions ADLs/Self Care Home Management;Traction;Ultrasound;Passive range of motion;Patient/family education;Functional mobility training;Cryotherapy;Therapeutic activities;Manual techniques;Therapeutic exercise;Electrical Stimulation;Moist Heat   PT Next Visit Plan progress as able   Consulted and Agree with Plan of Care Patient        Problem List Patient Active Problem List   Diagnosis Date Noted  . 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 Left knee pain 05/11/2014  . Sciatica 04/22/2014  . Osteoarthritis of right knee 02/06/2014  . Allergy to mold 12/13/2013  . Weight gain, abnormal 11/08/2013  . PTSD (post-traumatic stress disorder) 07/05/2013  . Lumbar strain 02/05/2013  . Multinodular goiter 01/19/2013  . Heel pain 11/17/2012  . GERD (gastroesophageal reflux disease) 08/09/2012  . Hypokalemia 12/07/2011  . Gastritis 11/30/2011  . Neck pain 11/12/2011  . Fatty liver 08/06/2011  . Multiple thyroid nodules 08/06/2011  . Heme positive stool 07/28/2011  . Cervical disc disease 07/28/2011  . Preventative health care 07/28/2011  . Erectile dysfunction 05/23/2011  . Elevated liver function tests 05/20/2011  . DM2 (diabetes mellitus, type 2) 05/19/2011  . HTN (hypertension) 05/19/2011  . Hyperlipidemia 05/19/2011  . Morbid obesity 05/19/2011  . Allergic rhinitis 05/19/2011  . OSA (obstructive sleep apnea) 05/19/2011  . Right knee pain 01/17/2011    Charles Nash PT, OCS 07/06/2014, 10:12 AM  [facility] [address]  [place], [place], [address] Phone: [phone]   Fax:  [phone]

## 2014-07-10 ENCOUNTER — Ambulatory Visit: Admitting: Physical Therapy

## 2014-07-10 DIAGNOSIS — M5441 Lumbago with sciatica, right side: Secondary | ICD-10-CM | POA: Diagnosis not present

## 2014-07-10 DIAGNOSIS — M5442 Lumbago with sciatica, left side: Principal | ICD-10-CM

## 2014-07-10 NOTE — Therapy (Signed)
[facility] [address]  [place], [place], [address] Phone: [phone]   Fax:  [phone]  Physical Therapy Treatment  Patient Details  Name: Charles Nash MRN: [id number] Date of Birth: 10/16/1965 Referring Provider:  Debbrah Alar, NP  Encounter Date: 07/10/2014      PT End of Session - 07/10/14 0813    Visit Number 3   Number of Visits 12   Date for PT Re-Evaluation 07/27/13   PT Start Time 0810   PT Stop Time 0907   PT Time Calculation (min) 57 min      Past Medical History  Diagnosis Date  . Hyperlipidemia   . Diabetes mellitus   . Hypertension   . OSA (obstructive sleep apnea)   . Arthritis   . Allergy   . Personal history of colonic polyps   . GERD (gastroesophageal reflux disease)   . Fatty liver 08/06/2011  . Bilateral varicoceles   . Plantar fasciitis   . ADHD (attention deficit hyperactivity disorder)   . PTSD (post-traumatic stress disorder)     Past Surgical History  Procedure Laterality Date  . Knee surgery  08/04/08    Right knee-- medial meniscus repair, attenuation anterior cruciate [name] MD [facility])   . Ankle surgery    . Foot surgery    . Shoulder surgery    . Varicocele excision    . Plantar fascia release      There were no vitals filed for this visit.  Visit Diagnosis:  Bilateral low back pain with sciatica, sciatica laterality unspecified      Subjective Assessment - 07/10/14 0811    Symptoms pt noted spasms into L scapula and thoracic area yesterday believing due to neck issues, states lower back was "sore" over the weekend.  Pt states he often feels increased LBP after lying prone or supine for extended periods (getting massage).   Currently in Pain? Yes   Pain Score 2    Pain Location Back   Pain Orientation Lower             TODAY'S TREATMENT NuStep lvl 5, 4' Stretch B HS, SKTC, and Prone Knee Flexion Quadruped LE  12x each, UE/LE 12x each 1/2 kneeling hip flexor stretch at edge of plinth TRX Squat to parallel 10x Pelvic Tilt 10x5" Bridge 15x  Mechanical Traction - Lumbar, Prone, Neutral pull, 78#/39#, 60"/20", 18'                    PT Short Term Goals - 07/06/14 6759    PT SHORT TERM GOAL #1   Title Patient to be independent in initial HEP for flexibility.  07/07/14   Status On-going   PT SHORT TERM GOAL #2   Title Patient to report pain no more than 4/10 with daily activities.  07/07/14   Status On-going   PT SHORT TERM GOAL #3   Title Patient to report tolerating 30 minutes walking prior to pain increase.  07/07/14   Status On-going           PT Long Term Goals - 07/06/14 0927    PT LONG TERM GOAL #1   Title Patient to be independent with advanced HEP for core strength and mobility.  07/28/14   Status On-going   PT LONG TERM GOAL #2   Status On-going   PT LONG TERM GOAL #3   Title Patient to report tolerating walking as needed for shopping/daily activities  without pain increase.  07/28/14   Status On-going   PT LONG TERM GOAL #4   Title FOTO functional reporting improved with no more than 41% limitation.  07/28/14   Status On-going               Plan - 07/10/14 0829    Clinical Impression Statement pt late again today due to [place] appointment prior to coming in.  So far he has been late for or missed every appointment which is certainly limiting our ability assist or progress him.  s/s indicated likely mechanical LBP related to tight hip flexors creating facet pain.   PT Next Visit Plan progress as able        Problem List Patient Active Problem List   Diagnosis Date Noted  . 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 Left knee pain 05/11/2014  . Sciatica 04/22/2014  . Osteoarthritis of right knee 02/06/2014  . Allergy to mold 12/13/2013  . Weight gain, abnormal 11/08/2013  . PTSD (post-traumatic stress disorder) 07/05/2013  . Lumbar strain 02/05/2013  .  Multinodular goiter 01/19/2013  . Heel pain 11/17/2012  . GERD (gastroesophageal reflux disease) 08/09/2012  . Hypokalemia 12/07/2011  . Gastritis 11/30/2011  . Neck pain 11/12/2011  . Fatty liver 08/06/2011  . Multiple thyroid nodules 08/06/2011  . Heme positive stool 07/28/2011  . Cervical disc disease 07/28/2011  . Preventative health care 07/28/2011  . Erectile dysfunction 05/23/2011  . Elevated liver function tests 05/20/2011  . DM2 (diabetes mellitus, type 2) 05/19/2011  . HTN (hypertension) 05/19/2011  . Hyperlipidemia 05/19/2011  . Morbid obesity 05/19/2011  . Allergic rhinitis 05/19/2011  . OSA (obstructive sleep apnea) 05/19/2011  . Right knee pain 01/17/2011    Lucindy Borel PT, OCS 07/10/2014, 9:07 AM  [facility] [place], [place], [address] Phone: [phone]   Fax:  [phone]

## 2014-07-13 ENCOUNTER — Ambulatory Visit: Admitting: Physical Therapy

## 2014-07-14 ENCOUNTER — Other Ambulatory Visit: Payer: Self-pay | Admitting: Family

## 2014-07-18 ENCOUNTER — Ambulatory Visit: Admitting: Physical Therapy

## 2014-07-20 ENCOUNTER — Ambulatory Visit: Admitting: Physical Therapy

## 2014-07-21 ENCOUNTER — Ambulatory Visit: Admitting: Physical Therapy

## 2014-07-25 ENCOUNTER — Ambulatory Visit: Admitting: Physical Therapy

## 2014-07-27 ENCOUNTER — Ambulatory Visit: Admitting: Physical Therapy

## 2014-07-31 ENCOUNTER — Ambulatory Visit: Attending: Family | Admitting: Physical Therapy

## 2014-07-31 DIAGNOSIS — M5441 Lumbago with sciatica, right side: Secondary | ICD-10-CM | POA: Diagnosis not present

## 2014-07-31 DIAGNOSIS — M5442 Lumbago with sciatica, left side: Secondary | ICD-10-CM | POA: Insufficient documentation

## 2014-07-31 DIAGNOSIS — M25659 Stiffness of unspecified hip, not elsewhere classified: Secondary | ICD-10-CM | POA: Diagnosis not present

## 2014-07-31 DIAGNOSIS — M545 Low back pain, unspecified: Secondary | ICD-10-CM

## 2014-07-31 NOTE — Therapy (Signed)
[facility] [address]  [place], [place], [address] Phone: [phone]   Fax:  [phone]  Physical Therapy Treatment  Patient Details  Name: Charles Nash MRN: [id number] Date of Birth: 1965/05/04 Referring Provider:  Debbrah Alar, NP  Encounter Date: 07/31/2014      PT End of Session - 07/31/14 0814    Visit Number 4   Number of Visits 12   Date for PT Re-Evaluation 08/28/14   PT Start Time 0814   PT Stop Time 0915   PT Time Calculation (min) 61 min      Past Medical History  Diagnosis Date  . Hyperlipidemia   . Diabetes mellitus   . Hypertension   . OSA (obstructive sleep apnea)   . Arthritis   . Allergy   . Personal history of colonic polyps   . GERD (gastroesophageal reflux disease)   . Fatty liver 08/06/2011  . Bilateral varicoceles   . Plantar fasciitis   . ADHD (attention deficit hyperactivity disorder)   . PTSD (post-traumatic stress disorder)     Past Surgical History  Procedure Laterality Date  . Knee surgery  08/04/08    Right knee-- medial meniscus repair, attenuation anterior cruciate [name] MD [facility])   . Ankle surgery    . Foot surgery    . Shoulder surgery    . Varicocele excision    . Plantar fascia release      There were no vitals filed for this visit.  Visit Diagnosis:  Bilateral low back pain without sciatica      Subjective Assessment - 07/31/14 0820    Subjective today is the 3rd time pt has been in to PT since his initial eval 6 weeks ago.  He missed last week due to death of his father.  He states his back is tight and sore due to spending a lot of time at hospital and on couch last week due to spending time with his parents.  States has not been performing HEP due to being so busy with family matters.  He states his legs feel like they are going to "give way" when he stands still (check out line at store).  Next MD appointment is  09/13/14.   How long can you sit comfortably? 20 minutes   How long can you stand comfortably? 5-10 minutes   Currently in Pain? Yes   Pain Score 3   rates pain 2/10 at best, 3/10 on AVG, and 6/10 at worst   Pain Location Back   Pain Orientation Lower;Right;Left   Pain Frequency Constant            [facility] PT Assessment - 07/31/14 0001    AROM   Lumbar Flexion hands to mid shins "sore"   Lumbar Extension limited to 25% normal with c/o increased LBP extending into B buttock   Flexibility   Hamstrings significant tightness B RF, hip flexors, and piriformis; mild tightness B HS   Special Tests    Special Tests Lumbar   FABER test   findings Positive   Side Right   Comment L LBP   Slump test   Findings Negative   Prone Knee Bend Test   Findings Negative   Straight Leg Raise   Findings Negative      TODAY'S TREATMENT Re-eval due to limited attendance in first weeks of POC  Stretch B HS, Piriformis, SKTC, and Prone Knee Flexion Bridge 15x Pelvic Tilt 10x5"  LTR 1' Hooklying March 10x Hooklying Hip ABD Black TB   Mechanical Traction - Lumbar, Hooklying, Neutral to slight flexion pull, 70#/35#, 60"/20", 15'                        PT Short Term Goals - 07/06/14 2841    PT SHORT TERM GOAL #1   Title Patient to be independent in initial HEP for flexibility.  07/07/14   Status On-going   PT SHORT TERM GOAL #2   Title Patient to report pain no more than 4/10 with daily activities.  07/07/14   Status On-going   PT SHORT TERM GOAL #3   Title Patient to report tolerating 30 minutes walking prior to pain increase.  07/07/14   Status On-going           PT Long Term Goals - 07/31/14 0849    PT LONG TERM GOAL #1   Title Patient to be independent with advanced HEP for core strength and mobility.  08/28/14   Status On-going   PT LONG TERM GOAL #2   Title Patient to report pain no more than 3/10 with daily activities.  08/28/14   Status On-going   PT LONG TERM  GOAL #3   Title Patient to report tolerating walking as needed for shopping/daily activities without pain increase.  08/28/14   Status On-going   PT LONG TERM GOAL #4   Title FOTO functional reporting improved with no more than 41% limitation.  08/28/14   Status On-going               Plan - 07/31/14 0921    Clinical Impression Statement pt has missed majority of appointments in first 6 weeks of his POC.  As it turns out his father has been very ill and he ultimately passed away last week.  Due to minimal attendance to date, no significant progress towards goals.  Currently, no radicular symptoms noted.  LBP noted with FABER testing but no other special testing.  Tightness noted throughout B hips most noteable in RF and piriformis.  Seems back pain likely facet in nature as most LOM and pain noted with lumbar extension today.   PT Next Visit Plan RF, piriformis and hip flexor stretching; lumbopelvic stability, traction and manual PRN        Problem List Patient Active Problem List   Diagnosis Date Noted  . 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 Left knee pain 05/11/2014  . Sciatica 04/22/2014  . Osteoarthritis of right knee 02/06/2014  . Allergy to mold 12/13/2013  . Weight gain, abnormal 11/08/2013  . PTSD (post-traumatic stress disorder) 07/05/2013  . Lumbar strain 02/05/2013  . Multinodular goiter 01/19/2013  . Heel pain 11/17/2012  . GERD (gastroesophageal reflux disease) 08/09/2012  . Hypokalemia 12/07/2011  . Gastritis 11/30/2011  . Neck pain 11/12/2011  . Fatty liver 08/06/2011  . Multiple thyroid nodules 08/06/2011  . Heme positive stool 07/28/2011  . Cervical disc disease 07/28/2011  . Preventative health care 07/28/2011  . Erectile dysfunction 05/23/2011  . Elevated liver function tests 05/20/2011  . DM2 (diabetes mellitus, type 2) 05/19/2011  . HTN (hypertension) 05/19/2011  . Hyperlipidemia 05/19/2011  . Morbid obesity 05/19/2011  . Allergic rhinitis 05/19/2011   . OSA (obstructive sleep apnea) 05/19/2011  . Right knee pain 01/17/2011    Maecy Podgurski PT, OCS 07/31/2014, 9:38 AM  [facility] [address]  [place], [place], [address]  Phone: [phone]   Fax:  [phone]

## 2014-08-02 LAB — HM DIABETES EYE EXAM

## 2014-08-03 ENCOUNTER — Ambulatory Visit: Admitting: Physical Therapy

## 2014-08-04 ENCOUNTER — Ambulatory Visit: Admitting: Rehabilitation

## 2014-08-04 DIAGNOSIS — M5442 Lumbago with sciatica, left side: Secondary | ICD-10-CM

## 2014-08-04 DIAGNOSIS — M545 Low back pain, unspecified: Secondary | ICD-10-CM

## 2014-08-04 DIAGNOSIS — M5441 Lumbago with sciatica, right side: Secondary | ICD-10-CM

## 2014-08-04 DIAGNOSIS — M25659 Stiffness of unspecified hip, not elsewhere classified: Secondary | ICD-10-CM

## 2014-08-04 NOTE — Therapy (Signed)
[facility] [address]  [place], [place], [address] Phone: [phone]   Fax:  [phone]  Physical Therapy Treatment  Patient Details  Name: Charles Nash MRN: [id number] Date of Birth: 12-25-1965 Referring Provider:  Debbrah Alar, NP  Encounter Date: 08/04/2014      PT End of Session - 08/04/14 1022    Visit Number 5   Number of Visits 12   Date for PT Re-Evaluation 08/28/14   PT Start Time 1020   PT Stop Time 1118   PT Time Calculation (min) 58 min      Past Medical History  Diagnosis Date  . Hyperlipidemia   . Diabetes mellitus   . Hypertension   . OSA (obstructive sleep apnea)   . Arthritis   . Allergy   . Personal history of colonic polyps   . GERD (gastroesophageal reflux disease)   . Fatty liver 08/06/2011  . Bilateral varicoceles   . Plantar fasciitis   . ADHD (attention deficit hyperactivity disorder)   . PTSD (post-traumatic stress disorder)     Past Surgical History  Procedure Laterality Date  . Knee surgery  08/04/08    Right knee-- medial meniscus repair, attenuation anterior cruciate [name] MD [facility])   . Ankle surgery    . Foot surgery    . Shoulder surgery    . Varicocele excision    . Plantar fascia release      There were no vitals filed for this visit.  Visit Diagnosis:  Bilateral low back pain without sciatica  Bilateral low back pain with sciatica, sciatica laterality unspecified  Stiffness of hip joint, unspecified laterality      Subjective Assessment - 08/04/14 1021    Subjective Reports he felt fine after last time but later in the day the pain came back and was bad. States he had pain for the rest of that day and the next.    Currently in Pain? Yes   Pain Score 3    Pain Location Back   Pain Orientation Lower;Right;Left      TODAY'S TREATMENT TherEx: Nustep level 5x4' Stretch B HS, Piriformis, SKTC, and Prone Knee  Flexion 3x20" each, bilateral Bridge 15x LTR 1' Pelvic Tilt 10x5" Hooklying March 10x, Hooklying Hip ABD Black TB 12x  Prone SLR 10x each (LBP noted on final 2 reps) Quadruped Alt LE 10x each  Mechanical Traction - Lumbar, Hooklying, Neutral to slight flexion pull, 76#/38#, 60"/20", 15'        PT Short Term Goals - 07/06/14 8768    PT SHORT TERM GOAL #1   Title Patient to be independent in initial HEP for flexibility.  07/07/14   Status On-going   PT SHORT TERM GOAL #2   Title Patient to report pain no more than 4/10 with daily activities.  07/07/14   Status On-going   PT SHORT TERM GOAL #3   Title Patient to report tolerating 30 minutes walking prior to pain increase.  07/07/14   Status On-going           PT Long Term Goals - 07/31/14 0849    PT LONG TERM GOAL #1   Title Patient to be independent with advanced HEP for core strength and mobility.  08/28/14   Status On-going   PT LONG TERM GOAL #2   Title Patient to report pain no more than 3/10 with daily activities.  08/28/14   Status On-going   PT LONG  TERM GOAL #3   Title Patient to report tolerating walking as needed for shopping/daily activities without pain increase.  08/28/14   Status On-going   PT LONG TERM GOAL #4   Title FOTO functional reporting improved with no more than 41% limitation.  08/28/14   Status On-going      Problem List Patient Active Problem List   Diagnosis Date Noted  . 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 Left knee pain 05/11/2014  . Sciatica 04/22/2014  . Osteoarthritis of right knee 02/06/2014  . Allergy to mold 12/13/2013  . Weight gain, abnormal 11/08/2013  . PTSD (post-traumatic stress disorder) 07/05/2013  . Lumbar strain 02/05/2013  . Multinodular goiter 01/19/2013  . Heel pain 11/17/2012  . GERD (gastroesophageal reflux disease) 08/09/2012  . Hypokalemia 12/07/2011  . Gastritis 11/30/2011  . Neck pain 11/12/2011  . Fatty liver 08/06/2011  . Multiple thyroid nodules 08/06/2011  .  Heme positive stool 07/28/2011  . Cervical disc disease 07/28/2011  . Preventative health care 07/28/2011  . Erectile dysfunction 05/23/2011  . Elevated liver function tests 05/20/2011  . DM2 (diabetes mellitus, type 2) 05/19/2011  . HTN (hypertension) 05/19/2011  . Hyperlipidemia 05/19/2011  . Morbid obesity 05/19/2011  . Allergic rhinitis 05/19/2011  . OSA (obstructive sleep apnea) 05/19/2011  . Right knee pain 01/17/2011    Barbette Hair, PTA 08/04/2014, 11:12 AM  [facility] [address]  [place], [place], [address] Phone: [phone]   Fax:  [phone]

## 2014-08-07 ENCOUNTER — Ambulatory Visit: Admitting: Physical Therapy

## 2014-08-07 DIAGNOSIS — M545 Low back pain, unspecified: Secondary | ICD-10-CM

## 2014-08-07 DIAGNOSIS — M5441 Lumbago with sciatica, right side: Secondary | ICD-10-CM | POA: Diagnosis not present

## 2014-08-07 NOTE — Therapy (Signed)
[facility] [address]  [place], [place], [address] Phone: [phone]   Fax:  [phone]  Physical Therapy Treatment  Patient Details  Name: Charles Nash MRN: [id number] Date of Birth: [birth year] Referring Provider:  Debbrah Alar, NP  Encounter Date: 08/07/2014      PT End of Session - 08/07/14 0909    Visit Number 6   Number of Visits 12   Date for PT Re-Evaluation 08/28/14   PT Start Time 0816   PT Stop Time 0920   PT Time Calculation (min) 64 min      Past Medical History  Diagnosis Date  . Hyperlipidemia   . Diabetes mellitus   . Hypertension   . OSA (obstructive sleep apnea)   . Arthritis   . Allergy   . Personal history of colonic polyps   . GERD (gastroesophageal reflux disease)   . Fatty liver 08/06/2011  . Bilateral varicoceles   . Plantar fasciitis   . ADHD (attention deficit hyperactivity disorder)   . PTSD (post-traumatic stress disorder)     Past Surgical History  Procedure Laterality Date  . Knee surgery  08/04/08    Right knee-- medial meniscus repair, attenuation anterior cruciate [name] MD [facility])   . Ankle surgery    . Foot surgery    . Shoulder surgery    . Varicocele excision    . Plantar fascia release      There were no vitals filed for this visit.  Visit Diagnosis:  Bilateral low back pain without sciatica      Subjective Assessment - 08/07/14 0820    Subjective States experienced a lot of muscle spasms following last treatment and believes this was due increased intensity on mechanical traction last treatment - denies discomfort while on traction. Today states is feling good so far.   Currently in Pain? Yes   Pain Score 2    Pain Location Back   Pain Orientation Lower   Multiple Pain Sites No         TODAY'S TREATMENT (pt 15' late) TherEx - NuStep lvl 5, 3' Bridge 15x Pelvic Tilt 10x5" Stretch B HS, Piriformis,  SKTC, and Prone Knee Flexion Partial Curl-up 20x Hooklying March 10x 3-way Prayer Stretch 2x20" each Quadruped UE/LE 10x Staggered Standing one-arm row 15# 12x, 20# 12x Staggered Standing Punch 15# 12x, 20# 12x 3 strips kinesiology tape to l-spine  Mechanical Traction - Lumbar, Hooklying, Neutral to slight flexion pull, 74#/37#, 60"/20", 16'                         PT Short Term Goals - 08/07/14 9371    PT SHORT TERM GOAL #1   Title Patient to be independent in initial HEP for flexibility.  07/07/14   Status Achieved           PT Long Term Goals - 07/31/14 0849    PT LONG TERM GOAL #1   Title Patient to be independent with advanced HEP for core strength and mobility.  08/28/14   Status On-going   PT LONG TERM GOAL #2   Title Patient to report pain no more than 3/10 with daily activities.  08/28/14   Status On-going   PT LONG TERM GOAL #3   Title Patient to report tolerating walking as needed for shopping/daily activities without pain increase.  08/28/14   Status On-going   PT LONG TERM GOAL #4  Title FOTO functional reporting improved with no more than 41% limitation.  08/28/14   Status On-going               Plan - 08/07/14 0919    Clinical Impression Statement added tape to l-spine to see if helps decrease post-workout soreness/spasms.  Otherwise seems to be progressing well and current s/s continue to indicate lumbar facet impingement (states noted B LE N/T with supine lying which goes away with bending knees)..   PT Next Visit Plan check to see if tape helped, possible dry needling if no benefit from tape; assess goals; continue RF, piriformis and hip flexor stretching; lumbopelvic stability, traction and manual PRN   Consulted and Agree with Plan of Care Patient        Problem List Patient Active Problem List   Diagnosis Date Noted  . 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 Left knee pain 05/11/2014  . Sciatica 04/22/2014  . Osteoarthritis of  right knee 02/06/2014  . Allergy to mold 12/13/2013  . Weight gain, abnormal 11/08/2013  . PTSD (post-traumatic stress disorder) 07/05/2013  . Lumbar strain 02/05/2013  . Multinodular goiter 01/19/2013  . Heel pain 11/17/2012  . GERD (gastroesophageal reflux disease) 08/09/2012  . Hypokalemia 12/07/2011  . Gastritis 11/30/2011  . Neck pain 11/12/2011  . Fatty liver 08/06/2011  . Multiple thyroid nodules 08/06/2011  . Heme positive stool 07/28/2011  . Cervical disc disease 07/28/2011  . Preventative health care 07/28/2011  . Erectile dysfunction 05/23/2011  . Elevated liver function tests 05/20/2011  . DM2 (diabetes mellitus, type 2) 05/19/2011  . HTN (hypertension) 05/19/2011  . Hyperlipidemia 05/19/2011  . Morbid obesity 05/19/2011  . Allergic rhinitis 05/19/2011  . OSA (obstructive sleep apnea) 05/19/2011  . Right knee pain 01/17/2011    Argel Pablo PT, OCS 08/07/2014, 9:29 AM  [facility] [address]  [place], [place], [address] Phone: [phone]   Fax:  [phone]

## 2014-08-10 ENCOUNTER — Ambulatory Visit: Admitting: Physical Therapy

## 2014-08-11 ENCOUNTER — Ambulatory Visit: Admitting: Physical Therapy

## 2014-08-11 DIAGNOSIS — M545 Low back pain, unspecified: Secondary | ICD-10-CM

## 2014-08-11 DIAGNOSIS — M5441 Lumbago with sciatica, right side: Secondary | ICD-10-CM | POA: Diagnosis not present

## 2014-08-11 NOTE — Therapy (Signed)
[facility] [address]  [place], [place], [address] Phone: [phone]   Fax:  [phone]  Physical Therapy Treatment  Patient Details  Name: Charles Nash MRN: [id number] Date of Birth: 1965-07-11 Referring Provider:  Debbrah Alar, NP  Encounter Date: 08/11/2014      PT End of Session - 08/11/14 0900    Visit Number 7   Number of Visits 12   Date for PT Re-Evaluation 08/28/14   PT Start Time 1601   PT Stop Time 0939   PT Time Calculation (min) 40 min      Past Medical History  Diagnosis Date  . Hyperlipidemia   . Diabetes mellitus   . Hypertension   . OSA (obstructive sleep apnea)   . Arthritis   . Allergy   . Personal history of colonic polyps   . GERD (gastroesophageal reflux disease)   . Fatty liver 08/06/2011  . Bilateral varicoceles   . Plantar fasciitis   . ADHD (attention deficit hyperactivity disorder)   . PTSD (post-traumatic stress disorder)     Past Surgical History  Procedure Laterality Date  . Knee surgery  08/04/08    Right knee-- medial meniscus repair, attenuation anterior cruciate [name] MD [facility])   . Ankle surgery    . Foot surgery    . Shoulder surgery    . Varicocele excision    . Plantar fascia release      There were no vitals filed for this visit.  Visit Diagnosis:  Bilateral low back pain without sciatica      Subjective Assessment - 08/11/14 0901    Subjective Pt late again (14 minutes late so did not turn him away).  He states he has been performing HEP and helping his Mother pack up and move stuff around the house due to recent passing of his Father.  Denies n/t.  States taped seemed to help but removed after 2 days due to feeling a little itchy   Currently in Pain? Yes   Pain Score 3   AVG pain 3/10, worst pain 6-7/10 noted with moving funiture, boxes, etc noting spasms with movements.   Pain Location Back   Pain  Orientation Lower   Pain Frequency Constant   Aggravating Factors  over-use, some exercise, forward bending   Pain Relieving Factors rest, lying down   Multiple Pain Sites No            [facility] PT Assessment - 08/11/14 0001    AROM   Lumbar Flexion hands to mid shins, no pain, lower back tightness   Lumbar Extension 25% with c/o increased LBP           TODAY'S TREATMENT (pt 14' late) TherEx - Bridge 15x Pelvic Tilt 10x5" Single Leg Bridge 10x each Partial Curl-up 20x 3-way Prayer Stretch 2x20" each Quadruped UE/LE 10x TRX Squat 15x Low Row 55# 15x, One-Arm Low Row 55# 10x each (noted some LBP with L one-arm row) TRX Low Row 15x TRX High Row 15x  Manual - dry needling to l-spine (see below).  Well tolerated, no immediate response noted.  3 strips kinesiology tape to l-spine             Trigger Point Dry Needling - 08/11/14 1026    Consent Given? Yes   Muscles Treated Lower Body --  B Lumbar Paraspinals and multifiti L2 to L5/S1  PT Short Term Goals - 08/07/14 0240    PT SHORT TERM GOAL #1   Title Patient to be independent in initial HEP for flexibility.  07/07/14   Status Achieved           PT Long Term Goals - 07/31/14 0849    PT LONG TERM GOAL #1   Title Patient to be independent with advanced HEP for core strength and mobility.  08/28/14   Status On-going   PT LONG TERM GOAL #2   Title Patient to report pain no more than 3/10 with daily activities.  08/28/14   Status On-going   PT LONG TERM GOAL #3   Title Patient to report tolerating walking as needed for shopping/daily activities without pain increase.  08/28/14   Status On-going   PT LONG TERM GOAL #4   Title FOTO functional reporting improved with no more than 41% limitation.  08/28/14   Status On-going               Plan - 08/11/14 0912    Clinical Impression Statement pt continues to show up late or miss appointment so overall level of motivation/compliance  really in question.  s/s consistent with lumbar facet pain.  trial dry needling today and continued taping.   PT Next Visit Plan may add e-stim to dry needling otherwise continue stability training   Consulted and Agree with Plan of Care Patient        Problem List Patient Active Problem List   Diagnosis Date Noted  . 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 Left knee pain 05/11/2014  . Sciatica 04/22/2014  . Osteoarthritis of right knee 02/06/2014  . Allergy to mold 12/13/2013  . Weight gain, abnormal 11/08/2013  . PTSD (post-traumatic stress disorder) 07/05/2013  . Lumbar strain 02/05/2013  . Multinodular goiter 01/19/2013  . Heel pain 11/17/2012  . GERD (gastroesophageal reflux disease) 08/09/2012  . Hypokalemia 12/07/2011  . Gastritis 11/30/2011  . Neck pain 11/12/2011  . Fatty liver 08/06/2011  . Multiple thyroid nodules 08/06/2011  . Heme positive stool 07/28/2011  . Cervical disc disease 07/28/2011  . Preventative health care 07/28/2011  . Erectile dysfunction 05/23/2011  . Elevated liver function tests 05/20/2011  . DM2 (diabetes mellitus, type 2) 05/19/2011  . HTN (hypertension) 05/19/2011  . Hyperlipidemia 05/19/2011  . Morbid obesity 05/19/2011  . Allergic rhinitis 05/19/2011  . OSA (obstructive sleep apnea) 05/19/2011  . Right knee pain 01/17/2011    Sincere Berlanga  PT, OCS  08/11/2014, 10:30 AM  [facility] [address]  [place], [place], [address] Phone: [phone]   Fax:  [phone]

## 2014-08-14 ENCOUNTER — Encounter: Payer: Self-pay | Admitting: Family Medicine

## 2014-08-14 ENCOUNTER — Ambulatory Visit (INDEPENDENT_AMBULATORY_CARE_PROVIDER_SITE_OTHER): Admitting: Family Medicine

## 2014-08-14 ENCOUNTER — Ambulatory Visit: Attending: Family | Admitting: Rehabilitation

## 2014-08-14 VITALS — BP 138/81 | HR 88 | Ht 73.0 in | Wt 320.0 lb

## 2014-08-14 DIAGNOSIS — M5441 Lumbago with sciatica, right side: Secondary | ICD-10-CM | POA: Diagnosis present

## 2014-08-14 DIAGNOSIS — M5442 Lumbago with sciatica, left side: Secondary | ICD-10-CM | POA: Diagnosis not present

## 2014-08-14 DIAGNOSIS — M545 Low back pain, unspecified: Secondary | ICD-10-CM

## 2014-08-14 DIAGNOSIS — M25562 Pain in left knee: Secondary | ICD-10-CM | POA: Diagnosis not present

## 2014-08-14 DIAGNOSIS — M25659 Stiffness of unspecified hip, not elsewhere classified: Secondary | ICD-10-CM | POA: Diagnosis not present

## 2014-08-14 DIAGNOSIS — M1712 Unilateral primary osteoarthritis, left knee: Secondary | ICD-10-CM

## 2014-08-14 MED ORDER — SODIUM HYALURONATE (VISCOSUP) 25 MG/2.5ML IX SOSY
2.5000 mL | PREFILLED_SYRINGE | Freq: Once | INTRA_ARTICULAR | Status: AC
  Administered 2014-08-14: 2.5 mL via INTRA_ARTICULAR

## 2014-08-14 NOTE — Therapy (Signed)
[facility] [address]  [place], [place], [address] Phone: [phone]   Fax:  [phone]  Physical Therapy Treatment  Patient Details  Name: Charles Nash MRN: [id number] Date of Birth: 04/01/1966 Referring Provider:  Debbrah Alar, NP  Encounter Date: 08/14/2014      PT End of Session - 08/14/14 0939    Visit Number 8   Number of Visits 12   Date for PT Re-Evaluation 08/28/14   PT Start Time 0935   PT Stop Time 1030   PT Time Calculation (min) 55 min      Past Medical History  Diagnosis Date  . Hyperlipidemia   . Diabetes mellitus   . Hypertension   . OSA (obstructive sleep apnea)   . Arthritis   . Allergy   . Personal history of colonic polyps   . GERD (gastroesophageal reflux disease)   . Fatty liver 08/06/2011  . Bilateral varicoceles   . Plantar fasciitis   . ADHD (attention deficit hyperactivity disorder)   . PTSD (post-traumatic stress disorder)     Past Surgical History  Procedure Laterality Date  . Knee surgery  08/04/08    Right knee-- medial meniscus repair, attenuation anterior cruciate [name] MD [facility])   . Ankle surgery    . Foot surgery    . Shoulder surgery    . Varicocele excision    . Plantar fascia release      There were no vitals filed for this visit.  Visit Diagnosis:  Bilateral low back pain without sciatica      Subjective Assessment - 08/14/14 0937    Subjective Reports his knee hurt really bad after last time, thinking it was due to the squats. He states he had to go get a shot in his knee due to the pain. States back did well over the weekend with highest pain level getting up to a 4/10. That was due to riding in the car too long. Pt does continue to think the kinesotape helps and states he felt great after last time with the dry needling.    Currently in Pain? Yes   Pain Score 2    Pain Location Back   Pain Orientation Lower       TODAY'S TREATMENT TherEx - Bridge 15x Pelvic Tilt 12x5" Single Leg Bridge 10x each Partial Curl-up 20x Hooklying March 15x LTR x1' Low Row 55# 15x, One-Arm Low Row 55# 12x each (noted some LBP with L one-arm row) TRX Low Row 15x TRX High Row 15x Seated Rotational Stab with double blue TB 10x3" each side Staggered Standing Punch 20# 12x  E-stim: IFC (80-150) x15' to low back with pt seated.  3 strips kinesiology tape to l-spine        PT Short Term Goals - 08/07/14 6546    PT SHORT TERM GOAL #1   Title Patient to be independent in initial HEP for flexibility.  07/07/14   Status Achieved           PT Long Term Goals - 07/31/14 0849    PT LONG TERM GOAL #1   Title Patient to be independent with advanced HEP for core strength and mobility.  08/28/14   Status On-going   PT LONG TERM GOAL #2   Title Patient to report pain no more than 3/10 with daily activities.  08/28/14   Status On-going   PT LONG TERM GOAL #3   Title Patient to report  tolerating walking as needed for shopping/daily activities without pain increase.  08/28/14   Status On-going   PT LONG TERM GOAL #4   Title FOTO functional reporting improved with no more than 41% limitation.  08/28/14   Status On-going               Plan - 08/14/14 0946    Clinical Impression Statement Pt unable to perform squatting or quadruped exercises due to knee pain. Good tolerance to other exercises though.         Problem List Patient Active Problem List   Diagnosis Date Noted  . 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 Left knee pain 05/11/2014  . Sciatica 04/22/2014  . Osteoarthritis of right knee 02/06/2014  . Allergy to mold 12/13/2013  . Weight gain, abnormal 11/08/2013  . PTSD (post-traumatic stress disorder) 07/05/2013  . Lumbar strain 02/05/2013  . Multinodular goiter 01/19/2013  . Heel pain 11/17/2012  . GERD (gastroesophageal reflux disease) 08/09/2012  . Hypokalemia 12/07/2011  . Gastritis 11/30/2011   . Neck pain 11/12/2011  . Fatty liver 08/06/2011  . Multiple thyroid nodules 08/06/2011  . Heme positive stool 07/28/2011  . Cervical disc disease 07/28/2011  . Preventative health care 07/28/2011  . Erectile dysfunction 05/23/2011  . Elevated liver function tests 05/20/2011  . DM2 (diabetes mellitus, type 2) 05/19/2011  . HTN (hypertension) 05/19/2011  . Hyperlipidemia 05/19/2011  . Morbid obesity 05/19/2011  . Allergic rhinitis 05/19/2011  . OSA (obstructive sleep apnea) 05/19/2011  . Right knee pain 01/17/2011    Barbette Hair, PTA 08/14/2014, 10:12 AM  [facility] [address]  [place], [place], [address] Phone: [phone]   Fax:  [phone]

## 2014-08-15 DIAGNOSIS — M1712 Unilateral primary osteoarthritis, left knee: Secondary | ICD-10-CM | POA: Insufficient documentation

## 2014-08-15 NOTE — Progress Notes (Signed)
Patient ID: Charles Nash, male   DOB: November 07, 1965, 49 y.o.   MRN: [id number]  49 yo M here for left knee pain   01/17/11:  Patient with history of two knee arthroscopies for meniscal debridements.  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  Some swelling.  Has grinding and catching anterior and medial knee.  Has iced knee but not taking any pain medicine.  Has h/o cortisone injections in past but none in past year or so.   10/19:  Patient did not have pain improvement with cortisone injection.  Was examined in training room at school and he would like to move forward with synvisc injections for his DJD of right knee.  No change in symptoms from last visit.   10/26:  Patient did well with first synvisc injection -- here today for second one.  Had good pain relief even after first injection though some soreness now.   02/14/11:  Overall doing well - actually having superficial knee pain over patellar tendon and kneecap. No joint line pain now.  Here for third synvisc injection.   02/06/14: Patient reports his right knee pain has worsened over past couple months and very bad past week. Increased swelling. Pain mainly medial. No catching, locking, giving out. Did well with supartz series and had approval to do this again.  02/13/14: Patient returns for second supartz injection. Has had some improvement.  11/18: Patient returns for third supartz injection. Some improvement though pain still 6/10 level.  05/09/14: Patient reports both knees bothering him - left knee hurting more now at 4/10 level - has not had injections in this knee, no prior x-rays. Right knee still 3/10 - would like to do 1 or 2 more supartz injections. Left knee feels like it is coming out of place at times, feels unstable and may  give out.  05/11/14: Patient returns for supartz injection of right knee, cortisone injection of left knee.  3/2: Patient reports cortisone injection didn't help left knee much. No new injuries or trauma. No swelling. No catching or locking but feels unstable.  5/2: Patient returns to start supartz series of left knee. Pain level 8/10.  Past Medical History  Diagnosis Date  . Hyperlipidemia   . Diabetes mellitus   . Hypertension   . OSA (obstructive sleep apnea)   . Arthritis   . Allergy   . Personal history of colonic polyps   . GERD (gastroesophageal reflux disease)   . Fatty liver 08/06/2011  . Bilateral varicoceles   . Plantar fasciitis   . ADHD (attention deficit hyperactivity disorder)   . PTSD (post-traumatic stress disorder)     Current Outpatient Prescriptions on File Prior to Visit  Medication Sig Dispense Refill  . aspirin 81 MG tablet Take 1 tablet (81 mg total) by mouth daily. 14 tablet 0  . atorvastatin (LIPITOR) 10 MG tablet Take 1 tablet (10 mg total) by mouth daily. 30 tablet 2  . Blood Glucose Monitoring Suppl (FREESTYLE LITE) DEVI Use to check blood sugar once a day.  DX 250.00 1 each 0  . cetirizine (ZYRTEC) 10 MG tablet Take 1 tablet (10 mg total) by mouth daily. 30 tablet 11  . Cholecalciferol (VITAMIN D PO) Take 1 tablet by mouth daily.    . clomiPHENE (CLOMID) 50 MG  tablet Take 50 mg by mouth daily.    . empagliflozin (JARDIANCE) 10 MG TABS tablet Take 10 mg by mouth daily.    Marland Kitchen glucose blood (FREESTYLE LITE) test strip Use as instructed to check blood sugar once a day.  DX E11.9 100 each 1  . hydrocortisone 2.5 % lotion Apply topically 2 (two) times daily. 59 mL 0  . Lancets (FREESTYLE) lancets Use to check blood sugar once a day.  DX E11.9 100 each 1  . metoprolol succinate (TOPROL-XL) 100 MG 24 hr tablet TAKE 1 TABLET DAILY WITH OR IMMEDIATELY FOLLOWING A MEAL 90 tablet 1  . Multiple Vitamin (MULTIVITAMIN WITH MINERALS) TABS tablet Take 1 tablet  by mouth daily.    Marland Kitchen NIASPAN 500 MG CR tablet TAKE 1 TABLET AT BEDTIME 90 tablet 0  . Omega-3 Fatty Acids (FISH OIL) 1000 MG CAPS Take 2 capsules (2,000 mg total) by mouth 2 (two) times daily.  0  . omeprazole (PRILOSEC) 20 MG capsule TAKE 2 CAPSULES EVERY MORNING 180 capsule 0  . ondansetron (ZOFRAN ODT) 4 MG disintegrating tablet Take 1 tablet (4 mg total) by mouth every 8 (eight) hours as needed for nausea or vomiting. 15 tablet 0  . potassium chloride SA (K-DUR,KLOR-CON) 20 MEQ tablet Take 1 tablet (20 mEq total) by mouth daily. 30 tablet 3  . sildenafil (VIAGRA) 100 MG tablet Take 1/2 to 1 tablet daily as needed 15 tablet 0  . traMADol (ULTRAM) 50 MG tablet Take 1 tablet (50 mg total) by mouth every 8 (eight) hours as needed. 30 tablet 0  . TRIBENZOR 40-10-25 MG TABS TAKE 1 TABLET DAILY 90 tablet 1  . zolpidem (AMBIEN) 10 MG tablet Take 1 tablet (10 mg total) by mouth at bedtime as needed. 90 tablet 0  . [DISCONTINUED] potassium chloride (K-DUR) 10 MEQ tablet Take 1 tablet (10 mEq total) by mouth daily. 30 tablet 1   Current Facility-Administered Medications on File Prior to Visit  Medication Dose Route Frequency Provider Last Rate Last Dose  . triamcinolone acetonide (KENALOG) 10 MG/ML injection 10 mg  10 mg Other Once Harriet Masson, DPM        Past Surgical History  Procedure Laterality Date  . Knee surgery  08/04/08    Right knee-- medial meniscus repair, attenuation anterior cruciate [name] MD [facility])   . Ankle surgery    . Foot surgery    . Shoulder surgery    . Varicocele excision    . Plantar fascia release      Allergies  Allergen Reactions  . Oxycodone-Acetaminophen Hives and Itching    History   Social History  . Marital Status: Married    Spouse Name: N/A  . Number of Children: 2  . Years of Education: N/A   Occupational History  . FOOTBALL COACH    Social History Main Topics  . Smoking status: Never Smoker   . Smokeless tobacco:  Never Used  . Alcohol Use: No     Comment: occasional wine  . Drug Use: No  . Sexual Activity: Not on file   Other Topics Concern  . Not on file   Social History Narrative   Regular exercise:  No   Caffeine use:  2--32oz cups daily          Family History  Problem Relation Age of Onset  . Diabetes Mother   . Hypertension Mother   . Arthritis Mother   . Cancer Mother     uterine  and breast cancer  . Hyperlipidemia Father   . Arthritis Father   . Cancer Father     thyroid, prostate cancer  . Other Father     mass in stomach  . Hyperlipidemia Maternal Grandmother   . Hypertension Maternal Grandmother   . Hyperlipidemia Maternal Grandfather   . Hypertension Maternal Grandfather   . Hyperlipidemia Paternal Grandmother   . Hypertension Paternal Grandmother   . Hyperlipidemia Paternal Grandfather   . Hypertension Paternal Grandfather   . Heart attack Paternal Grandfather   . Sudden death Neg Hx   . Colon cancer Neg Hx   . Esophageal cancer Neg Hx   . Stomach cancer Neg Hx   . Rectal cancer Neg Hx     BP 138/81 mmHg  Pulse 88  Ht 6\' 1"  (1.854 m)  Wt 320 lb (145.151 kg)  BMI 42.23 kg/m2  Review of Systems: See HPI above.    Objective:  Physical Exam:  Gen: NAD  L knee: Exam not repeated today. No gross deformity, ecchymoses, effusion. TTP lateral joint line > medial joint line. FROM. Negative ant/post drawers. Negative valgus/varus testing. Negative lachmanns. Negative mcmurrays, apleys, patellar apprehension. NV intact distally.     Assessment & Plan:   1. 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  S/p cortisone injection - will start supartz series today.  F/u in 1 week for second injection.  After informed written consent, patient was seated on exam table. Left knee was prepped with alcohol swab and utilizing anteromedial approach, patient's left knee was injected intraarticularly with 30mL marcaine followed by Jacklyn Shell. Patient tolerated the procedure  well without immediate complications.

## 2014-08-15 NOTE — Assessment & Plan Note (Signed)
Moderate DJD seen on MRI.  S/p cortisone injection - will start supartz series today.  F/u in 1 week for second injection.  After informed written consent, patient was seated on exam table. Left knee was prepped with alcohol swab and utilizing anteromedial approach, patient's left knee was injected intraarticularly with 41mL marcaine followed by Jacklyn Shell. Patient tolerated the procedure well without immediate complications.

## 2014-08-15 NOTE — Addendum Note (Signed)
Addended by: Dene Gentry on: 08/15/2014 04:04 PM   Modules accepted: Miquel Dunn

## 2014-08-16 ENCOUNTER — Ambulatory Visit (INDEPENDENT_AMBULATORY_CARE_PROVIDER_SITE_OTHER): Admitting: Podiatry

## 2014-08-16 ENCOUNTER — Ambulatory Visit (INDEPENDENT_AMBULATORY_CARE_PROVIDER_SITE_OTHER)

## 2014-08-16 ENCOUNTER — Encounter: Payer: Self-pay | Admitting: Podiatry

## 2014-08-16 VITALS — BP 130/74 | HR 93 | Resp 15

## 2014-08-16 DIAGNOSIS — M79671 Pain in right foot: Secondary | ICD-10-CM

## 2014-08-16 DIAGNOSIS — M7661 Achilles tendinitis, right leg: Secondary | ICD-10-CM | POA: Diagnosis not present

## 2014-08-16 MED ORDER — TRIAMCINOLONE ACETONIDE 10 MG/ML IJ SUSP
10.0000 mg | Freq: Once | INTRAMUSCULAR | Status: AC
  Administered 2014-08-16: 10 mg

## 2014-08-16 NOTE — Patient Instructions (Signed)

## 2014-08-16 NOTE — Progress Notes (Signed)
Subjective:     Patient ID: Charles Nash, male   DOB: [birth year], 49 y.o.   MRN: [id number]  HPI patient presents stating the back of my right heel is becoming increasingly painful and it's been difficult for the last couple weeks. I did have about 5 months of complete relief and I did wear the boot but it's completely worn out   Review of Systems     Objective:   Physical Exam Neurovascular status intact muscle strength adequate with discomfort in the posterior lateral aspect right heel that's present. The central and medial heel doing well and there is inflammation associated with it and no indications of tendon dysfunction or tendon strength loss    Assessment:     Acute Achilles tendinitis right lateral side    Plan:     Reviewed condition and treatment options. Patient is desperate for treatment I recommended a careful injection of the lateral side explaining risk of rupture associated with this. Patient wants procedure and I injected the lateral Achilles stain away from the central and medial side with 3 mg dexamethasone Kenalog 5 mg Xylocaine and I dispensed new or fracture walker to wear at all times. Patient be seen back 3 weeks or earlier if any issues should occur

## 2014-08-17 ENCOUNTER — Ambulatory Visit: Admitting: Physical Therapy

## 2014-08-21 ENCOUNTER — Ambulatory Visit (INDEPENDENT_AMBULATORY_CARE_PROVIDER_SITE_OTHER): Admitting: Family Medicine

## 2014-08-21 ENCOUNTER — Encounter: Payer: Self-pay | Admitting: Family Medicine

## 2014-08-21 ENCOUNTER — Ambulatory Visit: Admitting: Rehabilitation

## 2014-08-21 ENCOUNTER — Ambulatory Visit: Admitting: Family Medicine

## 2014-08-21 VITALS — BP 155/83 | HR 88 | Ht 73.0 in | Wt 327.0 lb

## 2014-08-21 DIAGNOSIS — M5441 Lumbago with sciatica, right side: Secondary | ICD-10-CM | POA: Diagnosis not present

## 2014-08-21 DIAGNOSIS — M545 Low back pain, unspecified: Secondary | ICD-10-CM

## 2014-08-21 DIAGNOSIS — M1712 Unilateral primary osteoarthritis, left knee: Secondary | ICD-10-CM

## 2014-08-21 DIAGNOSIS — M5442 Lumbago with sciatica, left side: Secondary | ICD-10-CM

## 2014-08-21 DIAGNOSIS — M25659 Stiffness of unspecified hip, not elsewhere classified: Secondary | ICD-10-CM

## 2014-08-21 MED ORDER — SODIUM HYALURONATE (VISCOSUP) 25 MG/2.5ML IX SOSY
2.5000 mL | PREFILLED_SYRINGE | Freq: Once | INTRA_ARTICULAR | Status: AC
Start: 2014-08-21 — End: 2014-08-21
  Administered 2014-08-21: 2.5 mL via INTRA_ARTICULAR

## 2014-08-21 NOTE — Therapy (Signed)
[facility] [address]  [place], [place], [address] Phone: [phone]   Fax:  [phone]  Physical Therapy Treatment  Patient Details  Name: Charles Nash MRN: [id number] Date of Birth: 07/15/1965 Referring Provider:  Debbrah Alar, NP  Encounter Date: 08/21/2014      PT End of Session - 08/21/14 0939    Visit Number 9   Number of Visits 12   Date for PT Re-Evaluation 08/28/14   PT Start Time 0936   PT Stop Time 1024   PT Time Calculation (min) 48 min      Past Medical History  Diagnosis Date  . Hyperlipidemia   . Diabetes mellitus   . Hypertension   . OSA (obstructive sleep apnea)   . Arthritis   . Allergy   . Personal history of colonic polyps   . GERD (gastroesophageal reflux disease)   . Fatty liver 08/06/2011  . Bilateral varicoceles   . Plantar fasciitis   . ADHD (attention deficit hyperactivity disorder)   . PTSD (post-traumatic stress disorder)     Past Surgical History  Procedure Laterality Date  . Knee surgery  08/04/08    Right knee-- medial meniscus repair, attenuation anterior cruciate [name] MD [facility])   . Ankle surgery    . Foot surgery    . Shoulder surgery    . Varicocele excision    . Plantar fascia release      There were no vitals filed for this visit.  Visit Diagnosis:  Bilateral low back pain without sciatica  Bilateral low back pain with sciatica, sciatica laterality unspecified  Stiffness of hip joint, unspecified laterality      Subjective Assessment - 08/21/14 0937    Subjective Comes in with a walking boot on his Rt foot, states that doctor put him in a boot due to heel spurs and are debating to do surgery or not. Pt states that he is now walking off balance and that is affecting his back and increasing his pain. Thinks the dry needling has helped the most but the tape and e-stim help as well. The tape typically lasts 1 day due  to skin irritation but pt thinks it really helps and wants to keep doing it.    Currently in Pain? Yes   Pain Score 5    Pain Location Back   Pain Orientation Lower      TODAY'S TREATMENT TherEx - Bridge 15x Pelvic Tilt 12x5" LTR x1' Hooklying March 15x Hip Ext iso into table 3" x10 each leg SLR 10x each Partial Curl-up 20x Seated Rotational Stab with double blue TB 10x3" each side Low Row 55# 20x One-Arm Low Row 45# 10x2 each  Seated childs pose with pball 2x20" M/R/L  E-stim: IFC (80-150) with ice x15' to low back with pt seated  3 strips kinesiology tape to l-spine         PT Short Term Goals - 08/07/14 5027    PT SHORT TERM GOAL #1   Title Patient to be independent in initial HEP for flexibility.  07/07/14   Status Achieved           PT Long Term Goals - 07/31/14 0849    PT LONG TERM GOAL #1   Title Patient to be independent with advanced HEP for core strength and mobility.  08/28/14   Status On-going   PT LONG TERM GOAL #2   Title Patient to report pain no more  than 3/10 with daily activities.  08/28/14   Status On-going   PT LONG TERM GOAL #3   Title Patient to report tolerating walking as needed for shopping/daily activities without pain increase.  08/28/14   Status On-going   PT LONG TERM GOAL #4   Title FOTO functional reporting improved with no more than 41% limitation.  08/28/14   Status On-going               Plan - 08/21/14 0942    Clinical Impression Statement Less standing exercises today due to knee and ankle pain. Pt sees MD again today for his knee pain, not back.    PT Next Visit Plan Continue with stability training and dry needling when with PT.    Consulted and Agree with Plan of Care Patient        Problem List Patient Active Problem List   Diagnosis Date Noted  . Osteoarthritis of left knee 08/15/2014  . 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 Left knee pain 05/11/2014  . Sciatica 04/22/2014  . Osteoarthritis of right knee  02/06/2014  . Allergy to mold 12/13/2013  . Weight gain, abnormal 11/08/2013  . PTSD (post-traumatic stress disorder) 07/05/2013  . Lumbar strain 02/05/2013  . Multinodular goiter 01/19/2013  . Heel pain 11/17/2012  . GERD (gastroesophageal reflux disease) 08/09/2012  . Hypokalemia 12/07/2011  . Gastritis 11/30/2011  . Neck pain 11/12/2011  . Fatty liver 08/06/2011  . Multiple thyroid nodules 08/06/2011  . Heme positive stool 07/28/2011  . Cervical disc disease 07/28/2011  . Preventative health care 07/28/2011  . Erectile dysfunction 05/23/2011  . Elevated liver function tests 05/20/2011  . DM2 (diabetes mellitus, type 2) 05/19/2011  . HTN (hypertension) 05/19/2011  . Hyperlipidemia 05/19/2011  . Morbid obesity 05/19/2011  . Allergic rhinitis 05/19/2011  . OSA (obstructive sleep apnea) 05/19/2011  . Right knee pain 01/17/2011    Barbette Hair, PTA 08/21/2014, 10:08 AM  [facility] [address]  [place], [place], [address] Phone: [phone]   Fax:  [phone]

## 2014-08-22 ENCOUNTER — Encounter: Payer: Self-pay | Admitting: Family

## 2014-08-23 NOTE — Progress Notes (Signed)
Patient ID: Charles Nash, male   DOB: 10-28-1965, 49 y.o.   MRN: [id number]  49 yo M here for left knee pain   01/17/11:  Patient with history of two knee arthroscopies for meniscal debridements.  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  Some swelling.  Has grinding and catching anterior and medial knee.  Has iced knee but not taking any pain medicine.  Has h/o cortisone injections in past but none in past year or so.   10/19:  Patient did not have pain improvement with cortisone injection.  Was examined in training room at school and he would like to move forward with synvisc injections for his DJD of right knee.  No change in symptoms from last visit.   10/26:  Patient did well with first synvisc injection -- here today for second one.  Had good pain relief even after first injection though some soreness now.   02/14/11:  Overall doing well - actually having superficial knee pain over patellar tendon and kneecap. No joint line pain now.  Here for third synvisc injection.   02/06/14: Patient reports his right knee pain has worsened over past couple months and very bad past week. Increased swelling. Pain mainly medial. No catching, locking, giving out. Did well with supartz series and had approval to do this again.  02/13/14: Patient returns for second supartz injection. Has had some improvement.  11/18: Patient returns for third supartz injection. Some improvement though pain still 6/10 level.  05/09/14: Patient reports both knees bothering him - left knee hurting more now at 4/10 level - has not had injections in this knee, no prior x-rays. Right knee still 3/10 - would like to do 1 or 2 more supartz injections. Left knee feels like it is coming out of place at times, feels unstable and may  give out.  05/11/14: Patient returns for supartz injection of right knee, cortisone injection of left knee.  3/2: Patient reports cortisone injection didn't help left knee much. No new injuries or trauma. No swelling. No catching or locking but feels unstable.  5/2: Patient returns to start supartz series of left knee. Pain level 8/10.  5/9: Patient returns for second supartz injection. Pain now 3/10.  Past Medical History  Diagnosis Date  . Hyperlipidemia   . Diabetes mellitus   . Hypertension   . OSA (obstructive sleep apnea)   . Arthritis   . Allergy   . Personal history of colonic polyps   . GERD (gastroesophageal reflux disease)   . Fatty liver 08/06/2011  . Bilateral varicoceles   . Plantar fasciitis   . ADHD (attention deficit hyperactivity disorder)   . PTSD (post-traumatic stress disorder)     Current Outpatient Prescriptions on File Prior to Visit  Medication Sig Dispense Refill  . aspirin 81 MG tablet Take 1 tablet (81 mg total) by mouth daily. 14 tablet 0  . atorvastatin (LIPITOR) 10 MG tablet Take 1 tablet (10 mg total) by mouth daily. 30 tablet 2  . Blood Glucose Monitoring Suppl (FREESTYLE LITE) DEVI Use to check blood sugar once a day.  DX 250.00 1 each 0  . cetirizine (ZYRTEC) 10 MG tablet Take 1 tablet (10 mg total) by mouth daily. 30 tablet 11  . Cholecalciferol (VITAMIN D PO) Take 1 tablet  by mouth daily.    . clomiPHENE (CLOMID) 50 MG tablet Take 50 mg by mouth daily.    . empagliflozin (JARDIANCE) 10 MG TABS tablet Take 10 mg by mouth daily.    Marland Kitchen glucose blood (FREESTYLE LITE) test strip Use as instructed to check blood sugar once a day.  DX E11.9 100 each 1  . hydrocortisone 2.5 % lotion Apply topically 2 (two) times daily. 59 mL 0  . Lancets (FREESTYLE) lancets Use to check blood sugar once a day.  DX E11.9 100 each 1  . metoprolol succinate (TOPROL-XL) 100 MG 24 hr tablet TAKE 1 TABLET DAILY WITH OR IMMEDIATELY FOLLOWING A MEAL 90 tablet 1  .  Multiple Vitamin (MULTIVITAMIN WITH MINERALS) TABS tablet Take 1 tablet by mouth daily.    Marland Kitchen NIASPAN 500 MG CR tablet TAKE 1 TABLET AT BEDTIME 90 tablet 0  . Omega-3 Fatty Acids (FISH OIL) 1000 MG CAPS Take 2 capsules (2,000 mg total) by mouth 2 (two) times daily.  0  . omeprazole (PRILOSEC) 20 MG capsule TAKE 2 CAPSULES EVERY MORNING 180 capsule 0  . ondansetron (ZOFRAN ODT) 4 MG disintegrating tablet Take 1 tablet (4 mg total) by mouth every 8 (eight) hours as needed for nausea or vomiting. 15 tablet 0  . potassium chloride SA (K-DUR,KLOR-CON) 20 MEQ tablet Take 1 tablet (20 mEq total) by mouth daily. 30 tablet 3  . sildenafil (VIAGRA) 100 MG tablet Take 1/2 to 1 tablet daily as needed 15 tablet 0  . traMADol (ULTRAM) 50 MG tablet Take 1 tablet (50 mg total) by mouth every 8 (eight) hours as needed. 30 tablet 0  . TRIBENZOR 40-10-25 MG TABS TAKE 1 TABLET DAILY 90 tablet 1  . zolpidem (AMBIEN) 10 MG tablet Take 1 tablet (10 mg total) by mouth at bedtime as needed. 90 tablet 0  . [DISCONTINUED] potassium chloride (K-DUR) 10 MEQ tablet Take 1 tablet (10 mEq total) by mouth daily. 30 tablet 1   Current Facility-Administered Medications on File Prior to Visit  Medication Dose Route Frequency Provider Last Rate Last Dose  . triamcinolone acetonide (KENALOG) 10 MG/ML injection 10 mg  10 mg Other Once Harriet Masson, DPM        Past Surgical History  Procedure Laterality Date  . Knee surgery  08/04/08    Right knee-- medial meniscus repair, attenuation anterior cruciate [name] MD [facility])   . Ankle surgery    . Foot surgery    . Shoulder surgery    . Varicocele excision    . Plantar fascia release      Allergies  Allergen Reactions  . Oxycodone-Acetaminophen Hives and Itching    History   Social History  . Marital Status: Married    Spouse Name: N/A  . Number of Children: 2  . Years of Education: N/A   Occupational History  . FOOTBALL COACH    Social History  Main Topics  . Smoking status: Never Smoker   . Smokeless tobacco: Never Used  . Alcohol Use: No     Comment: occasional wine  . Drug Use: No  . Sexual Activity: Not on file   Other Topics Concern  . Not on file   Social History Narrative   Regular exercise:  No   Caffeine use:  2--32oz cups daily          Family History  Problem Relation Age of Onset  . Diabetes Mother   . Hypertension Mother   . Arthritis  Mother   . Cancer Mother     uterine and breast cancer  . Hyperlipidemia Father   . Arthritis Father   . Cancer Father     thyroid, prostate cancer  . Other Father     mass in stomach  . Hyperlipidemia Maternal Grandmother   . Hypertension Maternal Grandmother   . Hyperlipidemia Maternal Grandfather   . Hypertension Maternal Grandfather   . Hyperlipidemia Paternal Grandmother   . Hypertension Paternal Grandmother   . Hyperlipidemia Paternal Grandfather   . Hypertension Paternal Grandfather   . Heart attack Paternal Grandfather   . Sudden death Neg Hx   . Colon cancer Neg Hx   . Esophageal cancer Neg Hx   . Stomach cancer Neg Hx   . Rectal cancer Neg Hx     BP 155/83 mmHg  Pulse 88  Ht 6\' 1"  (1.854 m)  Wt 327 lb (148.326 kg)  BMI 43.15 kg/m2  Review of Systems: See HPI above.    Objective:  Physical Exam:  Gen: NAD  L knee: Exam not repeated today. No gross deformity, ecchymoses, effusion. TTP lateral joint line > medial joint line. FROM. Negative ant/post drawers. Negative valgus/varus testing. Negative lachmanns. Negative mcmurrays, apleys, patellar apprehension. NV intact distally.     Assessment & Plan:   1. 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  S/p cortisone injection - given second supartz injection today.  F/u in 1 week for third injection.  After informed written consent, patient was seated on exam table. Left knee was prepped with alcohol swab and utilizing anteromedial approach, patient's left knee was injected intraarticularly  with 71mL marcaine followed by Jacklyn Shell. Patient tolerated the procedure well without immediate complications.

## 2014-08-23 NOTE — Assessment & Plan Note (Signed)
Moderate DJD seen on MRI.  S/p cortisone injection - given second supartz injection today.  F/u in 1 week for third injection.  After informed written consent, patient was seated on exam table. Left knee was prepped with alcohol swab and utilizing anteromedial approach, patient's left knee was injected intraarticularly with 13mL marcaine followed by Jacklyn Shell. Patient tolerated the procedure well without immediate complications.

## 2014-08-24 ENCOUNTER — Ambulatory Visit: Admitting: Rehabilitation

## 2014-08-24 DIAGNOSIS — M25659 Stiffness of unspecified hip, not elsewhere classified: Secondary | ICD-10-CM

## 2014-08-24 DIAGNOSIS — M5441 Lumbago with sciatica, right side: Secondary | ICD-10-CM | POA: Diagnosis not present

## 2014-08-24 DIAGNOSIS — M5442 Lumbago with sciatica, left side: Secondary | ICD-10-CM

## 2014-08-24 DIAGNOSIS — M545 Low back pain, unspecified: Secondary | ICD-10-CM

## 2014-08-24 NOTE — Therapy (Signed)
[facility] [address]  [place], [place], [address] Phone: [phone]   Fax:  [phone]  Physical Therapy Treatment  Patient Details  Name: Charles Nash MRN: [id number] Date of Birth: 12-16-1965 Referring Provider:  Debbrah Alar, NP  Encounter Date: 08/24/2014      PT End of Session - 08/24/14 0933    Visit Number 10   Number of Visits 12   Date for PT Re-Evaluation 08/28/14   PT Start Time 0933   PT Stop Time 1028   PT Time Calculation (min) 55 min      Past Medical History  Diagnosis Date  . Hyperlipidemia   . Diabetes mellitus   . Hypertension   . OSA (obstructive sleep apnea)   . Arthritis   . Allergy   . Personal history of colonic polyps   . GERD (gastroesophageal reflux disease)   . Fatty liver 08/06/2011  . Bilateral varicoceles   . Plantar fasciitis   . ADHD (attention deficit hyperactivity disorder)   . PTSD (post-traumatic stress disorder)     Past Surgical History  Procedure Laterality Date  . Knee surgery  08/04/08    Right knee-- medial meniscus repair, attenuation anterior cruciate [name] MD [facility])   . Ankle surgery    . Foot surgery    . Shoulder surgery    . Varicocele excision    . Plantar fascia release      There were no vitals filed for this visit.  Visit Diagnosis:  Bilateral low back pain without sciatica  Bilateral low back pain with sciatica, sciatica laterality unspecified  Stiffness of hip joint, unspecified laterality      Subjective Assessment - 08/24/14 0936    Subjective Reports he is feeling super today and that the e-stim and ice helped so much. Michela Pitcher it was the best he has ever felt after last appointment. Not sure if the tape is helping.    How long can you sit comfortably? 30 minutes   How long can you stand comfortably? 5 minutes   How long can you walk comfortably? 10-15 minutes   Currently in Pain? Yes   Pain Score 2    Pain Location Back   Pain Orientation Lower   Aggravating Factors  laying flat on his back   Pain Relieving Factors ice, e-stim            [facility] PT Assessment - 08/24/14 0932    Observation/Other Assessments   Focus on Therapeutic Outcomes (FOTO)  62% limitation (CL)  No change   AROM   Lumbar Flexion hands to mid shins, no pain, lower back stiffness   Lumbar Extension 25% with c/o increased LBP   Lumbar - Right Side Bend limited about 25% reaches with fingertips about 2" from knee   Lumbar - Left Side Bend limited about 25% reaches fingertips to about 2" above knee   Lumbar - Right Rotation WNL   Lumbar - Left Rotation limited about 10% with no pain        TODAY'S TREATMENT TherEx - Bridge 15x Hip Ext iso into table 3" x12 each leg LTR x1' Partial curl ups 15x2 SL bridge x2 (pain) Hooklying March 15x ROM check/goal check Seated childs pose with pball 2x20" M/R/L  Seated Rotational Stab with double blue TB 10x5" each side Low Row 55# 20x One-Arm Low Row 45# 20xeach  Standing Hip ext with Green TB x15 each 2 pole assist Seated  Alt LE/UE lift x15   E-stim: IFC (80-150) with ice x15' to low back with pt seated          PT Short Term Goals - 08/07/14 1505    PT SHORT TERM GOAL #1   Title Patient to be independent in initial HEP for flexibility.  07/07/14   Status Achieved           PT Long Term Goals - 07/31/14 0849    PT LONG TERM GOAL #1   Title Patient to be independent with advanced HEP for core strength and mobility.  08/28/14   Status On-going   PT LONG TERM GOAL #2   Title Patient to report pain no more than 3/10 with daily activities.  08/28/14   Status On-going   PT LONG TERM GOAL #3   Title Patient to report tolerating walking as needed for shopping/daily activities without pain increase.  08/28/14   Status On-going   PT LONG TERM GOAL #4   Title FOTO functional reporting improved with no more than 41% limitation.  08/28/14    Status On-going               Plan - 09/20/2014 0953    Clinical Impression Statement Tolerated most of the treatment well, only difficulty/pain noted with SL bridges. No change noted with FOTO score dispite pt reporting he is feeling better than when he started. Unable to do quadruped exercises or squats per pt request due to bilateral knee pain. Did not apply kinesiotape due to pt not noticing a major difference.    PT Next Visit Plan Dry needling when with PT, continue with stability training standing/supine as able.           G-Codes - 2014/09/20 1319    Functional Assessment Tool Used foto 62% limitation   Functional Limitation Mobility: Walking and moving around   Mobility: Walking and Moving Around Current Status [phone]) At least 60 percent but less than 80 percent impaired, limited or restricted   Mobility: Walking and Moving Around Goal Status [phone]) At least 40 percent but less than 60 percent impaired, limited or restricted      Problem List Patient Active Problem List   Diagnosis Date Noted  . Osteoarthritis of left knee 08/15/2014  . 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 Left knee pain 05/11/2014  . Sciatica 04/22/2014  . Osteoarthritis of right knee 02/06/2014  . Allergy to mold 12/13/2013  . Weight gain, abnormal 11/08/2013  . PTSD (post-traumatic stress disorder) 07/05/2013  . Lumbar strain 02/05/2013  . Multinodular goiter 01/19/2013  . Heel pain 11/17/2012  . GERD (gastroesophageal reflux disease) 08/09/2012  . Hypokalemia 12/07/2011  . Gastritis 11/30/2011  . Neck pain 11/12/2011  . Fatty liver 08/06/2011  . Multiple thyroid nodules 08/06/2011  . Heme positive stool 07/28/2011  . Cervical disc disease 07/28/2011  . Preventative health care 07/28/2011  . Erectile dysfunction 05/23/2011  . Elevated liver function tests 05/20/2011  . DM2 (diabetes mellitus, type 2) 05/19/2011  . HTN (hypertension) 05/19/2011  . Hyperlipidemia 05/19/2011  . Morbid obesity  05/19/2011  . Allergic rhinitis 05/19/2011  . OSA (obstructive sleep apnea) 05/19/2011  . Right knee pain 01/17/2011    Barbette Hair, PTA 20-Sep-2014, 1:21 PM  Leonette Most PT, OCS Sep 20, 2014 1:21 PM   [facility] [address]  [place], [place], [address] Phone: [phone]   Fax:  [phone]

## 2014-08-28 ENCOUNTER — Encounter: Payer: Self-pay | Admitting: *Deleted

## 2014-08-31 ENCOUNTER — Ambulatory Visit: Admitting: Physical Therapy

## 2014-09-01 ENCOUNTER — Ambulatory Visit: Admitting: Rehabilitation

## 2014-09-04 ENCOUNTER — Ambulatory Visit: Admitting: Physical Therapy

## 2014-09-04 ENCOUNTER — Encounter: Payer: Self-pay | Admitting: Family

## 2014-09-04 DIAGNOSIS — M5441 Lumbago with sciatica, right side: Secondary | ICD-10-CM | POA: Diagnosis not present

## 2014-09-04 DIAGNOSIS — M545 Low back pain, unspecified: Secondary | ICD-10-CM

## 2014-09-04 NOTE — Therapy (Signed)
[facility] [address]  [place], [place], [address] Phone: [phone]   Fax:  [phone]  Physical Therapy Treatment  Patient Details  Name: Charles Nash MRN: [id number] Date of Birth: Jun 09, 1965 Referring Provider:  Debbrah Alar, NP  Encounter Date: 09/04/2014      PT End of Session - 09/04/14 0857    Visit Number 11   Number of Visits 19   Date for PT Re-Evaluation 10/02/14   PT Start Time 0853   PT Stop Time 0929   PT Time Calculation (min) 36 min      Past Medical History  Diagnosis Date  . Hyperlipidemia   . Diabetes mellitus   . Hypertension   . OSA (obstructive sleep apnea)   . Arthritis   . Allergy   . Personal history of colonic polyps   . GERD (gastroesophageal reflux disease)   . Fatty liver 08/06/2011  . Bilateral varicoceles   . Plantar fasciitis   . ADHD (attention deficit hyperactivity disorder)   . PTSD (post-traumatic stress disorder)     Past Surgical History  Procedure Laterality Date  . Knee surgery  08/04/08    Right knee-- medial meniscus repair, attenuation anterior cruciate [name] MD [facility])   . Ankle surgery    . Foot surgery    . Shoulder surgery    . Varicocele excision    . Plantar fascia release      There were no vitals filed for this visit.  Visit Diagnosis:  Bilateral low back pain without sciatica      Subjective Assessment - 09/04/14 0900    Subjective Pt here for PT re-evaluation and treatment.  He reports noting intermittent sensation of LE buckling/giving out while walking/standing during shoping.  Denies noting N/T or BB issues.  States pain is worst with walking and lying prone or supine. Reports feeling 45% improvement since beginning PT.  States  he continues to note pain but states level of function has increased without increasing pain.   How long can you sit comfortably? 10 minutes   How long can you stand  comfortably? 5 minutes   How long can you walk comfortably? 5 minutes   Patient Stated Goals Pain out of buttocks and lower back stronger   Pain Score 3   pt rates pain 3/10 on AVG and 4/10 at worst in the past week   Pain Location Back   Pain Orientation Right;Left;Lower   Pain Frequency Intermittent   Aggravating Factors  prone, supine, walking            [facility] PT Assessment - 09/04/14 0001    Assessment   Medical Diagnosis LBP   Functional Tests   Functional tests Squat   Squat   Comments depth limited to 50% parallel, depth limited by knees and back, significant FW wt shift over toes   ROM / Strength   AROM / PROM / Strength AROM;Strength   AROM   Lumbar Flexion hands to mid shins, c/o LBP   Lumbar Extension 25% normal with c/o LBP   Strength   Right Hip Flexion 5/5   Right Hip Extension 4/5   Right Hip External Rotation  --  5/5   Right Hip Internal Rotation  --  5/5   Left Hip Flexion 5/5   Left Hip Extension 4/5   Left Hip External Rotation  --  5/5   Left Hip Internal Rotation  --  5/5  Right Knee Flexion 5/5   Left Knee Flexion 5/5                       Trigger Point Dry Needling - 09/04/14 0913    Consent Given? Yes   Muscles Treated Lower Body --  B Lumbar paraspinals and multifiti L2 to L5-S1                PT Short Term Goals - 09/04/14 1359    PT SHORT TERM GOAL #1   Title Patient to be independent in initial HEP for flexibility.   Status Achieved   PT SHORT TERM GOAL #2   Title Patient to report pain no more than 4/10 with daily activities.   Status Achieved   PT SHORT TERM GOAL #3   Title Patient to report tolerating 30 minutes walking prior to pain increase.   Status On-going           PT Long Term Goals - 09/04/14 1401    PT LONG TERM GOAL #1   Title Patient to be independent with advanced HEP for core strength and mobility by 10/02/14.   Status Revised   PT LONG TERM GOAL #2   Title Patient to report  pain no more than 2/10 with daily activities by 10/02/14  AVG pain has been 3/10 and worst 4/10 lately.   Status Revised   PT LONG TERM GOAL #3   Title Patient to report tolerating walking as needed for shopping/daily activities without pain increase by 10/02/14   Status Revised   PT LONG TERM GOAL #4   Title FOTO functional reporting improved with no more than 41% limitation by 10/02/14   Status On-going               Plan - 09/04/14 0933    Clinical Impression Statement Is progressing well per MMT and subjective (pt reports feeling 45% improvement in level of fuction since beginning PT stating he is able to do more without increased pain).  Current impairments include painful lumbar AROM, impaired squat mechanics and ability due in part to c/o LBP, weakness with B Hip Extension, and B hip flexor tightness.  Dry needling performed today per pt request to B lumbar paraspinals due to good benefit in the past.  Charles Nash began PT on 06/14/14 but missed several appointments due to his Father becomming ill and ultimately passing away.  Therefore, whereas he was expected to complete his 12 PT treatments by mid April, he has completed 11 visits to date.  I recommend he continues with PT 2x/wk for an additional 4 weeks to progress towards goals.   Pt will benefit from skilled therapeutic intervention in order to improve on the following deficits Decreased range of motion;Impaired flexibility;Improper body mechanics;Decreased activity tolerance;Decreased strength;Pain;Increased muscle spasms;Postural dysfunction;Decreased mobility   Rehab Potential Good   PT Frequency 2x / week   PT Duration 4 weeks   PT Treatment/Interventions ADLs/Self Care Home Management;Traction;Ultrasound;Passive range of motion;Patient/family education;Functional mobility training;Cryotherapy;Therapeutic activities;Manual techniques;Therapeutic exercise;Electrical Stimulation;Moist Heat;Taping;Dry needling;Neuromuscular  re-education   PT Next Visit Plan Dry needling when with PT (try with e-stim next time), continue with stability training standing/supine as able.    Consulted and Agree with Plan of Care Patient        Problem List Patient Active Problem List   Diagnosis Date Noted  . Osteoarthritis of left knee 08/15/2014  . 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 Left knee pain 05/11/2014  . Sciatica  04/22/2014  . Osteoarthritis of right knee 02/06/2014  . Allergy to mold 12/13/2013  . Weight gain, abnormal 11/08/2013  . PTSD (post-traumatic stress disorder) 07/05/2013  . Lumbar strain 02/05/2013  . Multinodular goiter 01/19/2013  . Heel pain 11/17/2012  . GERD (gastroesophageal reflux disease) 08/09/2012  . Hypokalemia 12/07/2011  . Gastritis 11/30/2011  . Neck pain 11/12/2011  . Fatty liver 08/06/2011  . Multiple thyroid nodules 08/06/2011  . Heme positive stool 07/28/2011  . Cervical disc disease 07/28/2011  . Preventative health care 07/28/2011  . Erectile dysfunction 05/23/2011  . Elevated liver function tests 05/20/2011  . DM2 (diabetes mellitus, type 2) 05/19/2011  . HTN (hypertension) 05/19/2011  . Hyperlipidemia 05/19/2011  . Morbid obesity 05/19/2011  . Allergic rhinitis 05/19/2011  . OSA (obstructive sleep apnea) 05/19/2011  . Right knee pain 01/17/2011    Lyell Clugston PT, OCS 09/04/2014, 2:13 PM  [facility] [address]  [place], [place], [address] Phone: [phone]   Fax:  [phone]

## 2014-09-05 ENCOUNTER — Ambulatory Visit

## 2014-09-06 ENCOUNTER — Ambulatory Visit (INDEPENDENT_AMBULATORY_CARE_PROVIDER_SITE_OTHER)

## 2014-09-06 ENCOUNTER — Encounter: Payer: Self-pay | Admitting: Podiatry

## 2014-09-06 ENCOUNTER — Ambulatory Visit (INDEPENDENT_AMBULATORY_CARE_PROVIDER_SITE_OTHER): Admitting: Podiatry

## 2014-09-06 VITALS — BP 142/98 | HR 73 | Resp 15

## 2014-09-06 DIAGNOSIS — M7750 Other enthesopathy of unspecified foot: Secondary | ICD-10-CM

## 2014-09-06 DIAGNOSIS — M6588 Other synovitis and tenosynovitis, other site: Secondary | ICD-10-CM | POA: Diagnosis not present

## 2014-09-06 DIAGNOSIS — Z87828 Personal history of other (healed) physical injury and trauma: Secondary | ICD-10-CM

## 2014-09-06 DIAGNOSIS — Z8739 Personal history of other diseases of the musculoskeletal system and connective tissue: Secondary | ICD-10-CM

## 2014-09-06 MED ORDER — TRIAMCINOLONE ACETONIDE 10 MG/ML IJ SUSP
10.0000 mg | Freq: Once | INTRAMUSCULAR | Status: AC
  Administered 2014-09-06: 10 mg

## 2014-09-06 NOTE — Progress Notes (Signed)
Subjective:     Patient ID: Charles Nash, male   DOB: [birth year], 49 y.o.   MRN: [id number]  HPI patient states he was running uphill and started to develop pain in his left Achilles tendon and the right one that we worked on is doing pretty well and is discomfort he but nowhere near as bad as the left   Review of Systems     Objective:   Physical Exam Neurovascular status intact muscle strength adequate with pain in the posterior lateral aspect of the Achilles at its insertion into the posterior heel bone with inflammation fluid buildup noted. The right is doing well with mild discomfort    Assessment:     Posterior Achilles tendinitis left of an acute nature with the right doing well    Plan:     Reviewed condition and advised on careful injection explaining chances of risk or rupture. Patient wants procedure understanding risk and today I just did the lateral area 3 mg Decadron some Kenalog 5 mg Xylocaine being careful not to put it into the bulk of the Achilles tendon at its insertion. He will wear his air fracture walker from the right and will reappoint next 3 weeks

## 2014-09-07 ENCOUNTER — Encounter: Payer: Self-pay | Admitting: *Deleted

## 2014-09-08 ENCOUNTER — Encounter: Payer: Self-pay | Admitting: Internal Medicine

## 2014-09-08 ENCOUNTER — Telehealth: Payer: Self-pay | Admitting: Internal Medicine

## 2014-09-08 NOTE — Telephone Encounter (Signed)
Close encounter 

## 2014-09-13 ENCOUNTER — Telehealth: Payer: Self-pay | Admitting: *Deleted

## 2014-09-13 ENCOUNTER — Ambulatory Visit (INDEPENDENT_AMBULATORY_CARE_PROVIDER_SITE_OTHER): Admitting: Family

## 2014-09-13 ENCOUNTER — Encounter: Payer: Self-pay | Admitting: Family

## 2014-09-13 VITALS — BP 130/88 | HR 73 | Temp 98.2°F | Resp 16 | Ht 73.0 in | Wt 330.6 lb

## 2014-09-13 DIAGNOSIS — E785 Hyperlipidemia, unspecified: Secondary | ICD-10-CM | POA: Diagnosis not present

## 2014-09-13 DIAGNOSIS — E669 Obesity, unspecified: Secondary | ICD-10-CM

## 2014-09-13 DIAGNOSIS — E119 Type 2 diabetes mellitus without complications: Secondary | ICD-10-CM

## 2014-09-13 DIAGNOSIS — E1169 Type 2 diabetes mellitus with other specified complication: Secondary | ICD-10-CM

## 2014-09-13 DIAGNOSIS — I10 Essential (primary) hypertension: Secondary | ICD-10-CM

## 2014-09-13 LAB — HIV ANTIBODY (ROUTINE TESTING W REFLEX): HIV: NONREACTIVE

## 2014-09-13 MED ORDER — GLUCOSE BLOOD VI STRP: ORAL_STRIP | Status: DC

## 2014-09-13 MED ORDER — ATORVASTATIN CALCIUM 10 MG PO TABS: 10.0000 mg | ORAL_TABLET | Freq: Every day | ORAL | Status: DC

## 2014-09-13 MED ORDER — FREESTYLE LITE DEVI: Status: DC

## 2014-09-13 NOTE — Progress Notes (Signed)
Subjective:    Patient ID: Charles Nash, male    DOB: 04/09/1966, 49 y.o.   MRN: [id number]  HPI  Charles Nash is a 49 yr old male who presents today for follow up.  Patient presents today for follow up of multiple medical problems.  Diabetes Type 2  Pt is currently maintained on the following medications for diabetes:jardiance, reports that he feels that he felt januvia controlled his sugar better.   Lab Results  Component Value Date   HGBA1C 7.2* 06/13/2014   HGBA1C 6.6* 03/01/2014   HGBA1C 6.1* 11/08/2013    Lab Results  Component Value Date   MICROALBUR 3.18* 11/08/2013   LDLCALC 107* 06/13/2014   CREATININE 1.00 06/19/2014    Last diabetic eye exam was 08/02/14 + polyuria/polydipsia. Home glucose readings range not checking  Hyperlipidemia  Patient is currently maintained on the following medication for hyperlipidemia: lipitor (not taking niacin) - ran out of lipitor last  weekend.  Last lipid panel as follows:  Lab Results  Component Value Date   CHOL 173 06/13/2014   HDL 52.30 06/13/2014   LDLCALC 107* 06/13/2014   TRIG 67.0 06/13/2014   CHOLHDL 3 06/13/2014   Patient denies myalgia. Patient reports good compliance with low fat/low cholesterol diet.   Hypertension  Patient is currently maintained on the following medications for blood pressure: toprol xl, tribenzor Patient reports good compliance with blood pressure medications. Patient denies chest pain, shortness of breath or swelling. Last 3 blood pressure readings in our office are as follows: BP Readings from Last 3 Encounters:  09/13/14 130/88  09/06/14 142/98  08/21/14 155/83   Wt Readings from Last 3 Encounters:  09/13/14 330 lb 9.6 oz (149.959 kg)  08/21/14 327 lb (148.326 kg)  08/14/14 320 lb (145.151 kg)   Dad Died in 09/08/2022- has been eating sweets. Continues to walk. Lmping due to heel spurs.      Review of Systems See HPI  Past Medical History  Diagnosis Date  .  Hyperlipidemia   . Diabetes mellitus   . Hypertension   . OSA (obstructive sleep apnea)   . Arthritis   . Allergy   . Personal history of colonic polyps   . GERD (gastroesophageal reflux disease)   . Fatty liver 08/06/2011  . Bilateral varicoceles   . Plantar fasciitis   . ADHD (attention deficit hyperactivity disorder)   . PTSD (post-traumatic stress disorder)     History   Social History  . Marital Status: Married    Spouse Name: N/A  . Number of Children: 2  . Years of Education: N/A   Occupational History  . FOOTBALL COACH    Social History Main Topics  . Smoking status: Never Smoker   . Smokeless tobacco: Never Used  . Alcohol Use: No     Comment: occasional wine  . Drug Use: No  . Sexual Activity: Not on file   Other Topics Concern  . Not on file   Social History Narrative   Regular exercise:  No   Caffeine use:  2--32oz cups daily          Past Surgical History  Procedure Laterality Date  . Knee surgery  08/04/08    Right knee-- medial meniscus repair, attenuation anterior cruciate [name] MD [facility])   . Ankle surgery    . Foot surgery    . Shoulder surgery    . Varicocele excision    . Plantar fascia release    .  Nm myoview ltd  2010  . Doppler echocardiography  2010    Family History  Problem Relation Age of Onset  . Diabetes Mother   . Hypertension Mother   . Arthritis Mother   . Cancer Mother     uterine and breast cancer  . Hyperlipidemia Father   . Arthritis Father   . Cancer Father     thyroid, prostate cancer  . Other Father     mass in stomach  . Hyperlipidemia Maternal Grandmother   . Hypertension Maternal Grandmother   . Hyperlipidemia Maternal Grandfather   . Hypertension Maternal Grandfather   . Hyperlipidemia Paternal Grandmother   . Hypertension Paternal Grandmother   . Hyperlipidemia Paternal Grandfather   . Hypertension Paternal Grandfather   . Heart attack Paternal Grandfather   . Sudden death  Neg Hx   . Colon cancer Neg Hx   . Esophageal cancer Neg Hx   . Stomach cancer Neg Hx   . Rectal cancer Neg Hx     Allergies  Allergen Reactions  . Oxycodone-Acetaminophen Hives and Itching    Current Outpatient Prescriptions on File Prior to Visit  Medication Sig Dispense Refill  . aspirin 81 MG tablet Take 1 tablet (81 mg total) by mouth daily. 14 tablet 0  . Blood Glucose Monitoring Suppl (FREESTYLE LITE) DEVI Use to check blood sugar once a day.  DX 250.00 1 each 0  . cetirizine (ZYRTEC) 10 MG tablet Take 1 tablet (10 mg total) by mouth daily. 30 tablet 11  . Cholecalciferol (VITAMIN D PO) Take 1 tablet by mouth daily.    . clomiPHENE (CLOMID) 50 MG tablet Take 50 mg by mouth daily.    . empagliflozin (JARDIANCE) 10 MG TABS tablet Take 10 mg by mouth daily.    Marland Kitchen glucose blood (FREESTYLE LITE) test strip Use as instructed to check blood sugar once a day.  DX E11.9 100 each 1  . hydrocortisone 2.5 % lotion Apply topically 2 (two) times daily. 59 mL 0  . Lancets (FREESTYLE) lancets Use to check blood sugar once a day.  DX E11.9 100 each 1  . metoprolol succinate (TOPROL-XL) 100 MG 24 hr tablet TAKE 1 TABLET DAILY WITH OR IMMEDIATELY FOLLOWING A MEAL 90 tablet 1  . Multiple Vitamin (MULTIVITAMIN WITH MINERALS) TABS tablet Take 1 tablet by mouth daily.    . Omega-3 Fatty Acids (FISH OIL) 1000 MG CAPS Take 2 capsules (2,000 mg total) by mouth 2 (two) times daily.  0  . omeprazole (PRILOSEC) 20 MG capsule TAKE 2 CAPSULES EVERY MORNING 180 capsule 0  . potassium chloride SA (K-DUR,KLOR-CON) 20 MEQ tablet Take 1 tablet (20 mEq total) by mouth daily. 30 tablet 3  . sildenafil (VIAGRA) 100 MG tablet Take 1/2 to 1 tablet daily as needed 15 tablet 0  . traMADol (ULTRAM) 50 MG tablet Take 1 tablet (50 mg total) by mouth every 8 (eight) hours as needed. 30 tablet 0  . TRIBENZOR 40-10-25 MG TABS TAKE 1 TABLET DAILY 90 tablet 1  . zolpidem (AMBIEN) 10 MG tablet Take 1 tablet (10 mg total) by mouth  at bedtime as needed. 90 tablet 0  . [DISCONTINUED] potassium chloride (K-DUR) 10 MEQ tablet Take 1 tablet (10 mEq total) by mouth daily. 30 tablet 1   Current Facility-Administered Medications on File Prior to Visit  Medication Dose Route Frequency Provider Last Rate Last Dose  . triamcinolone acetonide (KENALOG) 10 MG/ML injection 10 mg  10 mg Other Once [employer],  DPM        BP 130/88 mmHg  Pulse 73  Temp(Src) 98.2 F (36.8 C) (Oral)  Resp 16  Ht 6\' 1"  (1.854 m)  Wt 330 lb 9.6 oz (149.959 kg)  BMI 43.63 kg/m2  SpO2 97%       Objective:   Physical Exam  Constitutional: He is oriented to person, place, and time. He appears well-developed and well-nourished. No distress.  HENT:  Head: Normocephalic and atraumatic.  Cardiovascular: Normal rate and regular rhythm.   No murmur heard. Pulmonary/Chest: Effort normal and breath sounds normal. No respiratory distress. He has no wheezes. He has no rales.  Musculoskeletal: He exhibits no edema.  Neurological: He is alert and oriented to person, place, and time.  Skin: Skin is warm and dry.  Psychiatric: He has a normal mood and affect. His behavior is normal. Thought content normal.          Assessment & Plan:

## 2014-09-13 NOTE — Telephone Encounter (Signed)
Received email from pharmacy that onetouch verio Rx will require prior authorization. Insurance prefers [employer]. We had previously prescribed pt freestyle lite. Is it ok to send new meter and test strip rx for freestyle lite?

## 2014-09-13 NOTE — Progress Notes (Signed)
Pre visit review using our clinic review tool, if applicable. No additional management support is needed unless otherwise documented below in the visit note. 

## 2014-09-13 NOTE — Telephone Encounter (Signed)
Yes.  Please send freestyle Lite with test strips.

## 2014-09-13 NOTE — Patient Instructions (Addendum)
Please complete lab work prior to leaving.   

## 2014-09-13 NOTE — Telephone Encounter (Signed)
Notified pt and sent new Rxs to [facility]. Glucometer Rx printed and was faxed. Pt will return meter that was given to him from our office today as he states he has not opened the inner packages.

## 2014-09-14 ENCOUNTER — Ambulatory Visit: Attending: Family

## 2014-09-14 ENCOUNTER — Telehealth: Payer: Self-pay | Admitting: Family

## 2014-09-14 DIAGNOSIS — M545 Low back pain: Secondary | ICD-10-CM | POA: Insufficient documentation

## 2014-09-14 DIAGNOSIS — M5441 Lumbago with sciatica, right side: Secondary | ICD-10-CM | POA: Insufficient documentation

## 2014-09-14 DIAGNOSIS — M5442 Lumbago with sciatica, left side: Secondary | ICD-10-CM | POA: Insufficient documentation

## 2014-09-14 DIAGNOSIS — M25659 Stiffness of unspecified hip, not elsewhere classified: Secondary | ICD-10-CM | POA: Insufficient documentation

## 2014-09-14 NOTE — Assessment & Plan Note (Signed)
Lab Results  Component Value Date   CHOL 173 06/13/2014   HDL 52.30 06/13/2014   LDLCALC 107* 06/13/2014   TRIG 67.0 06/13/2014   CHOLHDL 3 06/13/2014   LDL slightly above goal. Reinforced importance of low cholesterol diet.

## 2014-09-14 NOTE — Assessment & Plan Note (Signed)
Obtain A1C. Pt given rx for new glucose meter.

## 2014-09-14 NOTE — Telephone Encounter (Signed)
Pt to come back today for redraw

## 2014-09-14 NOTE — Telephone Encounter (Signed)
Was A1C drawn yesterday?

## 2014-09-14 NOTE — Telephone Encounter (Signed)
Spoke with Shanon Brow and he will check and let us know status.

## 2014-09-14 NOTE — Assessment & Plan Note (Addendum)
BP stable. BP stable. Discussed weight loss.

## 2014-09-15 ENCOUNTER — Encounter: Payer: Self-pay | Admitting: Family

## 2014-09-15 ENCOUNTER — Other Ambulatory Visit: Payer: Self-pay | Admitting: Family

## 2014-09-15 LAB — HEMOGLOBIN A1C: Hgb A1c MFr Bld: 6.6 % — ABNORMAL HIGH (ref 4.6–6.5)

## 2014-09-15 NOTE — Addendum Note (Signed)
Addended by: Modena Morrow D on: 09/15/2014 10:18 AM   Modules accepted: Orders

## 2014-09-15 NOTE — Telephone Encounter (Signed)
Could you please check status of A1C? thanks

## 2014-09-15 NOTE — Addendum Note (Signed)
Addended by: Modena Morrow D on: 09/15/2014 10:43 AM   Modules accepted: Orders

## 2014-09-18 ENCOUNTER — Encounter: Payer: Self-pay | Admitting: Family

## 2014-09-19 ENCOUNTER — Ambulatory Visit: Admitting: Physical Therapy

## 2014-09-19 DIAGNOSIS — M5442 Lumbago with sciatica, left side: Secondary | ICD-10-CM | POA: Diagnosis present

## 2014-09-19 DIAGNOSIS — M545 Low back pain, unspecified: Secondary | ICD-10-CM

## 2014-09-19 DIAGNOSIS — M5441 Lumbago with sciatica, right side: Secondary | ICD-10-CM | POA: Diagnosis present

## 2014-09-19 DIAGNOSIS — M25659 Stiffness of unspecified hip, not elsewhere classified: Secondary | ICD-10-CM | POA: Diagnosis present

## 2014-09-19 NOTE — Therapy (Signed)
[facility] [address]  [place], [place], [address] Phone: [phone]   Fax:  [phone]  Physical Therapy Treatment  Patient Details  Name: Charles Nash MRN: [id number] Date of Birth: 1966-02-13 Referring Provider:  Debbrah Alar, NP  Encounter Date: 09/19/2014      PT End of Session - 09/19/14 0934    Visit Number 12   Number of Visits 19   Date for PT Re-Evaluation 10/02/14   PT Start Time 0853   PT Stop Time 0934   PT Time Calculation (min) 41 min      Past Medical History  Diagnosis Date  . Hyperlipidemia   . Diabetes mellitus   . Hypertension   . OSA (obstructive sleep apnea)   . Arthritis   . Allergy   . Personal history of colonic polyps   . GERD (gastroesophageal reflux disease)   . Fatty liver 08/06/2011  . Bilateral varicoceles   . Plantar fasciitis   . ADHD (attention deficit hyperactivity disorder)   . PTSD (post-traumatic stress disorder)     Past Surgical History  Procedure Laterality Date  . Knee surgery  08/04/08    Right knee-- medial meniscus repair, attenuation anterior cruciate [name] MD [facility])   . Ankle surgery    . Foot surgery    . Shoulder surgery    . Varicocele excision    . Plantar fascia release    . Nm myoview ltd  2010  . Doppler echocardiography  2010    There were no vitals filed for this visit.  Visit Diagnosis:  Bilateral low back pain without sciatica      Subjective Assessment - 09/19/14 0857    Subjective pt has not been here for 2 weeks.  States back "just tender" today rating 2/10 states was up to 4/10 couple days ago.  States last session of dry needling went well and he wants to undergo this again today.   Currently in Pain? Yes   Pain Score 2    Pain Location Back   Pain Orientation Right;Left;Lower        TODAY'S TREATMENT Manual - dry needling to B l-spine with e-stim through needles and dispersion  pad at ipsilateral L/S area - premod 10-20HZ , 4x20" each of 6 needles (3 each side)  TherEx - Quadruped UE/LE 10x, partial curl-up 15x, seated one-arm low row Black TB 10x each (all for HEP)                  PT Education - 09/19/14 0934    Education provided Yes   Education Details HEP update   Person(s) Educated Patient   Methods Explanation;Demonstration;Handout   Comprehension Verbalized understanding;Returned demonstration          PT Short Term Goals - 09/04/14 1359    PT SHORT TERM GOAL #1   Title Patient to be independent in initial HEP for flexibility.   Status Achieved   PT SHORT TERM GOAL #2   Title Patient to report pain no more than 4/10 with daily activities.   Status Achieved   PT SHORT TERM GOAL #3   Title Patient to report tolerating 30 minutes walking prior to pain increase.   Status On-going           PT Long Term Goals - 09/19/14 1101    PT LONG TERM GOAL #1   Title Patient to be independent with advanced HEP for core strength and  mobility by 10/02/14.  progressed today   Status On-going   PT LONG TERM GOAL #2   Title Patient to report pain no more than 2/10 with daily activities by 10/02/14   Status On-going   PT LONG TERM GOAL #3   Title Patient to report tolerating walking as needed for shopping/daily activities without pain increase by 10/02/14   Status On-going   PT LONG TERM GOAL #4   Title FOTO functional reporting improved with no more than 41% limitation by 10/02/14   Status On-going               Plan - 09/19/14 1103    Clinical Impression Statement pt hasn't been seen for 2 weeks but states last treatment with dry needling went well and wants this again today.  That was bulk of treatment with addition of e-stim to needles followed by HEP progression.   PT Next Visit Plan Dry needling when with PT, continue with stability training standing/supine as able.    Consulted and Agree with Plan of Care Patient         Problem List Patient Active Problem List   Diagnosis Date Noted  . Osteoarthritis of left knee 08/15/2014  . 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 Left knee pain 05/11/2014  . Sciatica 04/22/2014  . Osteoarthritis of right knee 02/06/2014  . Allergy to mold 12/13/2013  . Weight gain, abnormal 11/08/2013  . PTSD (post-traumatic stress disorder) 07/05/2013  . Lumbar strain 02/05/2013  . Multinodular goiter 01/19/2013  . Heel pain 11/17/2012  . GERD (gastroesophageal reflux disease) 08/09/2012  . Hypokalemia 12/07/2011  . Gastritis 11/30/2011  . Neck pain 11/12/2011  . Fatty liver 08/06/2011  . Multiple thyroid nodules 08/06/2011  . Heme positive stool 07/28/2011  . Cervical disc disease 07/28/2011  . Preventative health care 07/28/2011  . Erectile dysfunction 05/23/2011  . Elevated liver function tests 05/20/2011  . DM2 (diabetes mellitus, type 2) 05/19/2011  . HTN (hypertension) 05/19/2011  . Hyperlipidemia 05/19/2011  . Morbid obesity 05/19/2011  . Allergic rhinitis 05/19/2011  . OSA (obstructive sleep apnea) 05/19/2011  . Right knee pain 01/17/2011    Aubreanna Percle PT, OCS 09/19/2014, 11:05 AM  [facility] [address]  [place], [place], [address] Phone: [phone]   Fax:  [phone]

## 2014-09-20 ENCOUNTER — Ambulatory Visit: Admitting: Internal Medicine

## 2014-09-20 ENCOUNTER — Other Ambulatory Visit: Payer: Self-pay | Admitting: Otolaryngology

## 2014-09-20 DIAGNOSIS — E040 Nontoxic diffuse goiter: Secondary | ICD-10-CM

## 2014-09-20 DIAGNOSIS — E041 Nontoxic single thyroid nodule: Secondary | ICD-10-CM

## 2014-09-21 ENCOUNTER — Ambulatory Visit

## 2014-09-25 ENCOUNTER — Ambulatory Visit: Admitting: Rehabilitation

## 2014-09-25 ENCOUNTER — Inpatient Hospital Stay: Admission: RE | Admit: 2014-09-25 | Source: Ambulatory Visit

## 2014-09-25 DIAGNOSIS — M5442 Lumbago with sciatica, left side: Secondary | ICD-10-CM

## 2014-09-25 DIAGNOSIS — M545 Low back pain, unspecified: Secondary | ICD-10-CM

## 2014-09-25 DIAGNOSIS — M5441 Lumbago with sciatica, right side: Secondary | ICD-10-CM

## 2014-09-25 DIAGNOSIS — M25659 Stiffness of unspecified hip, not elsewhere classified: Secondary | ICD-10-CM

## 2014-09-25 NOTE — Therapy (Addendum)
[facility] [address]  [place], [place], [address] Phone: [phone]   Fax:  [phone]  Physical Therapy Treatment  Patient Details  Name: Charles Nash MRN: [id number] Date of Birth: 29-May-1965 Referring Provider:  Debbrah Alar, NP  Encounter Date: 09/25/2014      PT End of Session - 09/25/14 1400    Visit Number 13   Number of Visits 19   Date for PT Re-Evaluation 10/02/14   PT Start Time 1400   PT Stop Time 0076   PT Time Calculation (min) 49 min      Past Medical History  Diagnosis Date  . Hyperlipidemia   . Diabetes mellitus   . Hypertension   . OSA (obstructive sleep apnea)   . Arthritis   . Allergy   . Personal history of colonic polyps   . GERD (gastroesophageal reflux disease)   . Fatty liver 08/06/2011  . Bilateral varicoceles   . Plantar fasciitis   . ADHD (attention deficit hyperactivity disorder)   . PTSD (post-traumatic stress disorder)     Past Surgical History  Procedure Laterality Date  . Knee surgery  08/04/08    Right knee-- medial meniscus repair, attenuation anterior cruciate [name] MD [facility])   . Ankle surgery    . Foot surgery    . Shoulder surgery    . Varicocele excision    . Plantar fascia release    . Nm myoview ltd  2010  . Doppler echocardiography  2010    There were no vitals filed for this visit.  Visit Diagnosis:  Bilateral low back pain without sciatica  Bilateral low back pain with sciatica, sciatica laterality unspecified  Stiffness of hip joint, unspecified laterality      Subjective Assessment - 09/25/14 1403    Subjective Reports he is going to see his surgeon tomorrow for his knees and ankles so he isn't sure how much knee or ankle bending he can do. Reports he always has back pain which is always a 3/10.   Currently in Pain? Yes   Pain Score 3    Pain Location Back   Pain Orientation Right;Left;Lower       TODAY'S TREATMENT TherEx - Nustep level 5x5' Bridge 15x Partial curl ups 15x2 Hookying Alt UE/LE march x10 LTR x1' Seated Rotational Stab with double blue TB 10x5" each side Low Row 55# 2x15 Alt One Arm Low Row 45# 20x each  Standing Hip ext with Green TB x15 each 2 pole assist Standing Hip abd with Green TB x15 each 2 pole assist Seated Single Hip abd Black TB x15 Seated Single Hip add Black TB x15  CPx10' to low back with pt seated in chair        PT Short Term Goals - 09/04/14 1359    PT SHORT TERM GOAL #1   Title Patient to be independent in initial HEP for flexibility.   Status Achieved   PT SHORT TERM GOAL #2   Title Patient to report pain no more than 4/10 with daily activities.   Status Achieved   PT SHORT TERM GOAL #3   Title Patient to report tolerating 30 minutes walking prior to pain increase.   Status On-going           PT Long Term Goals - 09/25/14 1423    PT LONG TERM GOAL #1   Title Patient to be independent with advanced HEP for core strength and mobility  by 10/02/14.   Status On-going   PT LONG TERM GOAL #2   Title Patient to report pain no more than 2/10 with daily activities by 10/02/14   Status On-going   PT LONG TERM GOAL #3   Title Patient to report tolerating walking as needed for shopping/daily activities without pain increase by 10/02/14   Status On-going   PT LONG TERM GOAL #4   Title FOTO functional reporting improved with no more than 41% limitation by 10/02/14   Status On-going      G-code: Mobility Current and Discharge CL (60-80% limitation), Goal was CK (40-60% limitation)         Plan - 09/25/14 1412    Clinical Impression Statement Did not perform quadruped exercises due to knee and foot problems per pt. Good tolerance to other core stability exercises. Will continue as able.    PT Next Visit Plan Dry needling when with PT, continue with stability training standing/supine as able.    Consulted and Agree with Plan of  Care Patient        Problem List Patient Active Problem List   Diagnosis Date Noted  . Osteoarthritis of left knee 08/15/2014  . 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 Left knee pain 05/11/2014  . Sciatica 04/22/2014  . Osteoarthritis of right knee 02/06/2014  . Allergy to mold 12/13/2013  . Weight gain, abnormal 11/08/2013  . PTSD (post-traumatic stress disorder) 07/05/2013  . Lumbar strain 02/05/2013  . Multinodular goiter 01/19/2013  . Heel pain 11/17/2012  . GERD (gastroesophageal reflux disease) 08/09/2012  . Hypokalemia 12/07/2011  . Gastritis 11/30/2011  . Neck pain 11/12/2011  . Fatty liver 08/06/2011  . Multiple thyroid nodules 08/06/2011  . Heme positive stool 07/28/2011  . Cervical disc disease 07/28/2011  . Preventative health care 07/28/2011  . Erectile dysfunction 05/23/2011  . Elevated liver function tests 05/20/2011  . DM2 (diabetes mellitus, type 2) 05/19/2011  . HTN (hypertension) 05/19/2011  . Hyperlipidemia 05/19/2011  . Morbid obesity 05/19/2011  . Allergic rhinitis 05/19/2011  . OSA (obstructive sleep apnea) 05/19/2011  . Right knee pain 01/17/2011    Barbette Hair, PTA 09/25/2014, 2:40 PM  [facility] [address]  [place], [place], [address] Phone: [phone]   Fax:  [phone]     PHYSICAL THERAPY DISCHARGE SUMMARY  Visits from Start of Care: 13  Current functional level related to goals / functional outcomes: unknown   Remaining deficits: LBP   Education / Equipment: HEP  Plan: Patient agrees to discharge.  Patient goals were not met. Patient is being discharged due to                                                     ?????clinic no-show policy.       Charles Nash is being discharged due to number of no-shows and cancellations exceeding clinic policy.  Charles Nash PT, OCS 10/27/2014 9:21 AM

## 2014-09-26 ENCOUNTER — Encounter

## 2014-09-26 ENCOUNTER — Telehealth: Payer: Self-pay | Admitting: Family

## 2014-09-26 NOTE — Telephone Encounter (Signed)
Relation to pt: self Call back number: [phone] Pharmacy: [facility]   Reason for call:  Pt requesting a requesting diet pills Naltrexone-Bupropion HCl ER 8-90 MG TB12. Requesting RX please send to [facility].

## 2014-09-27 NOTE — Telephone Encounter (Signed)
Patient returned phone call. He states that he will answer this time.

## 2014-09-27 NOTE — Telephone Encounter (Signed)
I do not see medication on patient's med list nor in medication history.  He will need to see [name] about this as his PCP.

## 2014-09-27 NOTE — Telephone Encounter (Signed)
Attempted to reach pt.  Voice mailbox is full.  Will try again later.

## 2014-09-27 NOTE — Telephone Encounter (Signed)
Spoke with pt. Advised him he will need appt with [facility] to discuss weight loss treatment as Provider doesn't typically prescribe weight loss meds. Pt states his mom was just prescribed this and was told it was good for people with HTN.  Scheduled appt for 10/05/14 at 8:45am.

## 2014-09-28 ENCOUNTER — Ambulatory Visit: Admitting: Rehabilitation

## 2014-09-28 ENCOUNTER — Ambulatory Visit

## 2014-10-02 ENCOUNTER — Ambulatory Visit: Admitting: Rehabilitation

## 2014-10-03 ENCOUNTER — Telehealth: Payer: Self-pay | Admitting: *Deleted

## 2014-10-03 MED ORDER — ZOLPIDEM TARTRATE 10 MG PO TABS: 10.0000 mg | ORAL_TABLET | Freq: Every evening | ORAL | Status: DC | PRN

## 2014-10-03 NOTE — Telephone Encounter (Signed)
Received request from [facility] for 90 day supply of ambien 10mg .  Rx form signed by supervising MD, Charlett Blake and faxed to [phone].

## 2014-10-04 ENCOUNTER — Other Ambulatory Visit: Payer: Self-pay | Admitting: Family

## 2014-10-04 ENCOUNTER — Encounter: Admitting: Physical Therapy

## 2014-10-05 ENCOUNTER — Encounter: Payer: Self-pay | Admitting: Family

## 2014-10-05 ENCOUNTER — Ambulatory Visit (INDEPENDENT_AMBULATORY_CARE_PROVIDER_SITE_OTHER): Admitting: Family

## 2014-10-05 VITALS — BP 140/90 | HR 73 | Temp 97.8°F | Resp 16 | Ht 73.0 in | Wt 335.2 lb

## 2014-10-05 DIAGNOSIS — F329 Major depressive disorder, single episode, unspecified: Secondary | ICD-10-CM | POA: Insufficient documentation

## 2014-10-05 DIAGNOSIS — I10 Essential (primary) hypertension: Secondary | ICD-10-CM

## 2014-10-05 DIAGNOSIS — F32A Depression, unspecified: Secondary | ICD-10-CM

## 2014-10-05 LAB — BASIC METABOLIC PANEL
BUN: 10 mg/dL (ref 6–23)
CHLORIDE: 106 meq/L (ref 96–112)
CO2: 25 meq/L (ref 19–32)
Calcium: 9.7 mg/dL (ref 8.4–10.5)
Creatinine, Ser: 0.93 mg/dL (ref 0.40–1.50)
GFR: 111.41 mL/min (ref >60.00)
GLUCOSE: 124 mg/dL — AB (ref 70–99)
Potassium: 3.7 mEq/L (ref 3.5–5.1)
Sodium: 140 mEq/L (ref 135–145)

## 2014-10-05 MED ORDER — BUPROPION HCL 75 MG PO TABS: ORAL_TABLET | ORAL | Status: DC

## 2014-10-05 MED ORDER — POTASSIUM CHLORIDE CRYS ER 20 MEQ PO TBCR: 20.0000 meq | EXTENDED_RELEASE_TABLET | Freq: Every day | ORAL | Status: DC

## 2014-10-05 NOTE — Patient Instructions (Signed)
Consider contacting [facility] to arrange a visit with one of our counselors. Start wellbutrin. Try to walk at least 30 minutes 5 days a week. Count calories using my fitness pal, with goal weight loss 1-2 pounds per week. Follow up in 1 month.

## 2014-10-05 NOTE — Progress Notes (Signed)
Pre visit review using our clinic review tool, if applicable. No additional management support is needed unless otherwise documented below in the visit note. 

## 2014-10-05 NOTE — Assessment & Plan Note (Signed)
I think that he is experiencing depression which has contributed to his lack of self care/poor eating and poor motivation.  Will give trial of Wellbutrin. This will hopefully help his mood while also helping to suppress appetite a bit.

## 2014-10-05 NOTE — Progress Notes (Signed)
Subjective:    Patient ID: Charles Nash, male    DOB: February 01, 1966, 49 y.o.   MRN: [id number]  HPI  Mr. Levitz is a 49 yr old male who presents today to discuss obesity.   He reports feeling down since his Dad passed in Aug 27, 2022.  Reports that his dad died due to medical error in surgery.  Reports that since his dad's passing he has been eating poorly and reports overall mood is poor. States he feels sluggish and unmotivated. Patient walks.   He is interested in trying contrave.    Review of Systems    see HPI  Past Medical History  Diagnosis Date  . Hyperlipidemia   . Diabetes mellitus   . Hypertension   . OSA (obstructive sleep apnea)   . Arthritis   . Allergy   . Personal history of colonic polyps   . GERD (gastroesophageal reflux disease)   . Fatty liver 08/06/2011  . Bilateral varicoceles   . Plantar fasciitis   . ADHD (attention deficit hyperactivity disorder)   . PTSD (post-traumatic stress disorder)     History   Social History  . Marital Status: Married    Spouse Name: N/A  . Number of Children: 2  . Years of Education: N/A   Occupational History  . FOOTBALL COACH    Social History Main Topics  . Smoking status: Never Smoker   . Smokeless tobacco: Never Used  . Alcohol Use: No     Comment: occasional wine  . Drug Use: No  . Sexual Activity: Not on file   Other Topics Concern  . Not on file   Social History Narrative   Regular exercise:  No   Caffeine use:  2--32oz cups daily          Past Surgical History  Procedure Laterality Date  . Knee surgery  08/04/08    Right knee-- medial meniscus repair, attenuation anterior cruciate [name] MD [facility])   . Ankle surgery    . Foot surgery    . Shoulder surgery    . Varicocele excision    . Plantar fascia release    . Nm myoview ltd  2010  . Doppler echocardiography  2010    Family History  Problem Relation Age of Onset  . Diabetes Mother   . Hypertension Mother   .  Arthritis Mother   . Cancer Mother     uterine and breast cancer  . Hyperlipidemia Father   . Arthritis Father   . Cancer Father     thyroid, prostate cancer  . Other Father     mass in stomach  . Hyperlipidemia Maternal Grandmother   . Hypertension Maternal Grandmother   . Hyperlipidemia Maternal Grandfather   . Hypertension Maternal Grandfather   . Hyperlipidemia Paternal Grandmother   . Hypertension Paternal Grandmother   . Hyperlipidemia Paternal Grandfather   . Hypertension Paternal Grandfather   . Heart attack Paternal Grandfather   . Sudden death Neg Hx   . Colon cancer Neg Hx   . Esophageal cancer Neg Hx   . Stomach cancer Neg Hx   . Rectal cancer Neg Hx     Allergies  Allergen Reactions  . Oxycodone-Acetaminophen Hives and Itching    Current Outpatient Prescriptions on File Prior to Visit  Medication Sig Dispense Refill  . aspirin 81 MG tablet Take 1 tablet (81 mg total) by mouth daily. 14 tablet 0  . atorvastatin (LIPITOR) 10 MG tablet Take 1  tablet (10 mg total) by mouth daily. 90 tablet 1  . Blood Glucose Monitoring Suppl (FREESTYLE LITE) DEVI Use to check blood sugar once a day.  E11.9 1 each 0  . cetirizine (ZYRTEC) 10 MG tablet Take 1 tablet (10 mg total) by mouth daily. 30 tablet 11  . Cholecalciferol (VITAMIN D PO) Take 1 tablet by mouth daily.    . clomiPHENE (CLOMID) 50 MG tablet Take 50 mg by mouth daily.    . empagliflozin (JARDIANCE) 10 MG TABS tablet Take 10 mg by mouth daily.    Marland Kitchen FREESTYLE LITE test strip USE AS INSTRUCTED TO CHECK BLOOD SUGAR ONCE A DAY 100 each 1  . Lancets (FREESTYLE) lancets USE TO CHECK BLOOD SUGAR ONCE A DAY 100 each 1  . metoprolol succinate (TOPROL-XL) 100 MG 24 hr tablet TAKE 1 TABLET DAILY WITH OR IMMEDIATELY FOLLOWING A MEAL 90 tablet 1  . Multiple Vitamin (MULTIVITAMIN WITH MINERALS) TABS tablet Take 1 tablet by mouth daily.    . Omega-3 Fatty Acids (FISH OIL) 1000 MG CAPS Take 2 capsules (2,000 mg total) by mouth 2  (two) times daily.  0  . omeprazole (PRILOSEC) 20 MG capsule TAKE 2 CAPSULES EVERY MORNING 180 capsule 1  . sildenafil (VIAGRA) 100 MG tablet Take 1/2 to 1 tablet daily as needed 15 tablet 0  . traMADol (ULTRAM) 50 MG tablet Take 1 tablet (50 mg total) by mouth every 8 (eight) hours as needed. 30 tablet 0  . TRIBENZOR 40-10-25 MG TABS TAKE 1 TABLET DAILY 90 tablet 1  . zolpidem (AMBIEN) 10 MG tablet Take 1 tablet (10 mg total) by mouth at bedtime as needed. 90 tablet 0  . potassium chloride SA (K-DUR,KLOR-CON) 20 MEQ tablet Take 1 tablet (20 mEq total) by mouth daily. (Patient not taking: Reported on 10/05/2014) 30 tablet 3  . [DISCONTINUED] potassium chloride (K-DUR) 10 MEQ tablet Take 1 tablet (10 mEq total) by mouth daily. 30 tablet 1   Current Facility-Administered Medications on File Prior to Visit  Medication Dose Route Frequency Provider Last Rate Last Dose  . triamcinolone acetonide (KENALOG) 10 MG/ML injection 10 mg  10 mg Other Once Richard Sikora, DPM        BP 140/90 mmHg  Pulse 73  Temp(Src) 97.8 F (36.6 C) (Oral)  Resp 16  Ht 6\' 1"  (1.854 m)  Wt 335 lb 3.2 oz (152.046 kg)  BMI 44.23 kg/m2  SpO2 97%    Objective:   Physical Exam  Constitutional: He is oriented to person, place, and time. He appears well-developed and well-nourished. No distress.  Neurological: He is alert and oriented to person, place, and time.  Psychiatric: He has a normal mood and affect. His behavior is normal. Judgment and thought content normal.          Assessment & Plan:

## 2014-10-05 NOTE — Assessment & Plan Note (Signed)
We discussed healthy diet, regular exercise, counting calories using my fitness pal. 15 min spent with pt today. >50% of this time was spent counseling pt on depression and weight loss.

## 2014-10-10 ENCOUNTER — Other Ambulatory Visit: Payer: Self-pay | Admitting: Family

## 2014-10-10 NOTE — Telephone Encounter (Signed)
Advise as this medication is not on current list.

## 2014-10-18 ENCOUNTER — Telehealth: Payer: Self-pay

## 2014-10-18 MED ORDER — OLMESARTAN-AMLODIPINE-HCTZ 40-10-25 MG PO TABS: 1.0000 | ORAL_TABLET | Freq: Every day | ORAL | Status: DC

## 2014-10-18 NOTE — Telephone Encounter (Signed)
Patient is requesting 3 tablets of the Tribenzor called in to the [facility], he stated his shipment has not come in yet and he needed a few pills to hold him over. His medication has already been shipped from his mail order pharmacy, Rx sent for 3 pills to [facility] as requested by the patient. I will forward to Gilmore Laroche to make her aware.   KP

## 2014-10-19 ENCOUNTER — Encounter: Payer: Self-pay | Admitting: Family

## 2014-10-19 ENCOUNTER — Ambulatory Visit (INDEPENDENT_AMBULATORY_CARE_PROVIDER_SITE_OTHER): Admitting: Family

## 2014-10-19 VITALS — BP 140/84 | HR 82 | Temp 97.8°F | Resp 16 | Ht 73.0 in | Wt 329.0 lb

## 2014-10-19 DIAGNOSIS — N481 Balanitis: Secondary | ICD-10-CM | POA: Diagnosis not present

## 2014-10-19 DIAGNOSIS — N4889 Other specified disorders of penis: Secondary | ICD-10-CM | POA: Diagnosis not present

## 2014-10-19 MED ORDER — CLOTRIMAZOLE 1 % EX CREA: 1.0000 "application " | TOPICAL_CREAM | Freq: Two times a day (BID) | CUTANEOUS | Status: DC

## 2014-10-19 NOTE — Progress Notes (Signed)
   Subjective:    Patient ID: Preston Fleeting, male    DOB: 18-Sep-1965, 49 y.o.   MRN: [id number]  HPI  Mr. Beveridge is a 49 yr old male who presents today with complaint of tenderness on head of penis.  Reports a "fishy smell".  Reports that he feeling chaffed.  Noted a "little bump."  Started 3-4 days ago.  No new sexual partners.    He is working on weight loss.  Wt Readings from Last 3 Encounters:  10/19/14 329 lb (149.233 kg)  10/05/14 335 lb 3.2 oz (152.046 kg)  09/13/14 330 lb 9.6 oz (149.959 kg)     Review of Systems    see HPI Objective:   Physical Exam  Constitutional: He appears well-developed and well-nourished. No distress.  Genitourinary:  + irritation noted at base of penile head, no obvious lesions or odor noted.           Assessment & Plan:

## 2014-10-19 NOTE — Patient Instructions (Signed)
Please complete lab work over the counter. Start clotrimazole cream twice daily to affected area. Call if symptoms worsen or if symptoms do not improve.

## 2014-10-19 NOTE — Progress Notes (Signed)
Pre visit review using our clinic review tool, if applicable. No additional management support is needed unless otherwise documented below in the visit note. 

## 2014-10-19 NOTE — Assessment & Plan Note (Signed)
rec trial of clotrimazole cream. Will obtain urine culture, and GC/Chlamydia testing as well as HSV testing to further evaluate for other potential causes.

## 2014-10-20 LAB — HSV 2 ANTIBODY, IGG: HSV 2 GLYCOPROTEIN G AB, IGG: 9.47 IV — AB

## 2014-10-20 LAB — GC/CHLAMYDIA PROBE AMP, URINE
Chlamydia, Swab/Urine, PCR: NEGATIVE
GC PROBE AMP, URINE: NEGATIVE

## 2014-10-20 LAB — HSV 1 ANTIBODY, IGG: HSV 1 Glycoprotein G Ab, IgG: <0.1 IV

## 2014-10-21 LAB — URINE CULTURE
Colony Count: NO GROWTH
ORGANISM ID, BACTERIA: NO GROWTH

## 2014-10-23 ENCOUNTER — Telehealth: Payer: Self-pay | Admitting: Family

## 2014-10-23 DIAGNOSIS — A6000 Herpesviral infection of urogenital system, unspecified: Secondary | ICD-10-CM

## 2014-10-23 LAB — HSV(HERPES SIMPLEX VRS) I + II AB-IGM: Herpes Simplex Vrs I&II-IgM Ab (EIA): 0.41 INDEX

## 2014-10-23 NOTE — Telephone Encounter (Signed)
Please let pt know that lab testing shows that he is positive for herpes type 2 (genital herpes). It does not appear to be a new infection though. I still recommend that he try the cream for the penile rash.  If he has developed blisters or still has discomfort let me know and I will send rx for valtrex.

## 2014-10-24 NOTE — Telephone Encounter (Signed)
Pt called back. States he has not been using cream every day but doesn't think it is helping. Continues to have soreness at head of penis. Denies blisters. Advised pt to use cream as instructed twice daily x 1 week and if symptoms persist to let us know.  Pt voices understanding.  Please advise if any other recommendation at this time.

## 2014-10-24 NOTE — Telephone Encounter (Signed)
Notified pt and he voices understanding. 

## 2014-10-25 ENCOUNTER — Encounter: Payer: Self-pay | Admitting: Family

## 2014-10-25 ENCOUNTER — Telehealth: Payer: Self-pay | Admitting: Family

## 2014-10-25 DIAGNOSIS — A6000 Herpesviral infection of urogenital system, unspecified: Secondary | ICD-10-CM | POA: Insufficient documentation

## 2014-10-25 HISTORY — DX: Herpesviral infection of urogenital system, unspecified: A60.00

## 2014-10-25 MED ORDER — VALACYCLOVIR HCL 500 MG PO TABS: 500.0000 mg | ORAL_TABLET | Freq: Two times a day (BID) | ORAL | Status: DC

## 2014-10-25 NOTE — Telephone Encounter (Signed)
Patient called to update on his weight loss progress. He will see [name] soon.    KP

## 2014-10-25 NOTE — Telephone Encounter (Signed)
Pt states that he is returning your call. Call back at [phone].

## 2014-10-25 NOTE — Addendum Note (Signed)
Addended by: Debbrah Alar on: 10/25/2014 03:22 PM   Modules accepted: Orders

## 2014-10-25 NOTE — Telephone Encounter (Signed)
Will rx with valtrex.

## 2014-11-06 ENCOUNTER — Ambulatory Visit (INDEPENDENT_AMBULATORY_CARE_PROVIDER_SITE_OTHER): Admitting: Family

## 2014-11-06 ENCOUNTER — Encounter: Payer: Self-pay | Admitting: Family

## 2014-11-06 VITALS — BP 134/82 | HR 70 | Temp 97.5°F | Resp 16 | Ht 72.0 in | Wt 327.4 lb

## 2014-11-06 DIAGNOSIS — F329 Major depressive disorder, single episode, unspecified: Secondary | ICD-10-CM

## 2014-11-06 DIAGNOSIS — F32A Depression, unspecified: Secondary | ICD-10-CM

## 2014-11-06 MED ORDER — BUPROPION HCL 75 MG PO TABS: ORAL_TABLET | ORAL | Status: DC

## 2014-11-06 MED ORDER — EMPAGLIFLOZIN 10 MG PO TABS: 10.0000 mg | ORAL_TABLET | Freq: Every day | ORAL | Status: DC

## 2014-11-06 NOTE — Assessment & Plan Note (Signed)
Slightly improved with wellbutrin, but he has not been on long enough to truly evaluate his response. Advised pt to continue wellbutrin. Follow up in 1 month.

## 2014-11-06 NOTE — Progress Notes (Signed)
Pre visit review using our clinic review tool, if applicable. No additional management support is needed unless otherwise documented below in the visit note. 

## 2014-11-06 NOTE — Progress Notes (Signed)
Subjective:    Patient ID: Charles Nash, male    DOB: [birth year], 49 y.o.   MRN: [id number]  HPI  Charles Nash is a 49 yr old male who presents today for follow up. He was seen on 10/05/14 for depression. At that time he reported poor mood and feeling unmotivated.  He reported frustration with his weight. He was placed on trial of wellbutrin.  He reports that he has started back at the gym, more "energized."  Reports that he started wellbutrin 2 days ago.   Obesity- reports that he continues to work on diet and exercise.   Weight today is 327. Wt Readings from Last 3 Encounters:  10/19/14 329 lb (149.233 kg)  10/05/14 335 lb 3.2 oz (152.046 kg)  09/13/14 330 lb 9.6 oz (149.959 kg)     Review of Systems See HPI  Past Medical History  Diagnosis Date  . Hyperlipidemia   . Diabetes mellitus   . Hypertension   . OSA (obstructive sleep apnea)   . Arthritis   . Allergy   . Personal history of colonic polyps   . GERD (gastroesophageal reflux disease)   . Fatty liver 08/06/2011  . Bilateral varicoceles   . Plantar fasciitis   . ADHD (attention deficit hyperactivity disorder)   . PTSD (post-traumatic stress disorder)   . Genital herpes 10/25/2014    History   Social History  . Marital Status: Married    Spouse Name: N/A  . Number of Children: 2  . Years of Education: N/A   Occupational History  . FOOTBALL COACH    Social History Main Topics  . Smoking status: Never Smoker   . Smokeless tobacco: Never Used  . Alcohol Use: No     Comment: occasional wine  . Drug Use: No  . Sexual Activity: Not on file   Other Topics Concern  . Not on file   Social History Narrative   Regular exercise:  No   Caffeine use:  2--32oz cups daily          Past Surgical History  Procedure Laterality Date  . Knee surgery  08/04/08    Right knee-- medial meniscus repair, attenuation anterior cruciate [name] MD [facility])   . Ankle surgery    . Foot surgery      . Shoulder surgery    . Varicocele excision    . Plantar fascia release    . Nm myoview ltd  2010  . Doppler echocardiography  2010    Family History  Problem Relation Age of Onset  . Diabetes Mother   . Hypertension Mother   . Arthritis Mother   . Cancer Mother     uterine and breast cancer  . Hyperlipidemia Father   . Arthritis Father   . Cancer Father     thyroid, prostate cancer  . Other Father     mass in stomach  . Hyperlipidemia Maternal Grandmother   . Hypertension Maternal Grandmother   . Hyperlipidemia Maternal Grandfather   . Hypertension Maternal Grandfather   . Hyperlipidemia Paternal Grandmother   . Hypertension Paternal Grandmother   . Hyperlipidemia Paternal Grandfather   . Hypertension Paternal Grandfather   . Heart attack Paternal Grandfather   . Sudden death Neg Hx   . Colon cancer Neg Hx   . Esophageal cancer Neg Hx   . Stomach cancer Neg Hx   . Rectal cancer Neg Hx     Allergies  Allergen Reactions  . Oxycodone-Acetaminophen  Hives and Itching    Current Outpatient Prescriptions on File Prior to Visit  Medication Sig Dispense Refill  . aspirin 81 MG tablet Take 1 tablet (81 mg total) by mouth daily. 14 tablet 0  . atorvastatin (LIPITOR) 10 MG tablet Take 1 tablet (10 mg total) by mouth daily. 90 tablet 1  . Blood Glucose Monitoring Suppl (FREESTYLE LITE) DEVI Use to check blood sugar once a day.  E11.9 1 each 0  . cetirizine (ZYRTEC) 10 MG tablet Take 1 tablet (10 mg total) by mouth daily. 30 tablet 11  . clomiPHENE (CLOMID) 50 MG tablet Take 50 mg by mouth daily.    . clotrimazole (CLOTRIMAZOLE ANTI-FUNGAL) 1 % cream Apply 1 application topically 2 (two) times daily. 30 g 0  . FREESTYLE LITE test strip USE AS INSTRUCTED TO CHECK BLOOD SUGAR ONCE A DAY 100 each 1  . Lancets (FREESTYLE) lancets USE TO CHECK BLOOD SUGAR ONCE A DAY 100 each 1  . MAGNESIUM CITRATE PO Take 1 tablet by mouth at bedtime.    . metoprolol succinate (TOPROL-XL) 100 MG  24 hr tablet TAKE 1 TABLET DAILY WITH OR IMMEDIATELY FOLLOWING A MEAL 90 tablet 1  . NIASPAN 500 MG CR tablet TAKE 1 TABLET AT BEDTIME 90 tablet 1  . Olmesartan-Amlodipine-HCTZ (TRIBENZOR) 40-10-25 MG TABS Take 1 tablet by mouth daily. 3 tablet 0  . omeprazole (PRILOSEC) 20 MG capsule TAKE 2 CAPSULES EVERY MORNING 180 capsule 1  . OVER THE COUNTER MEDICATION DR SHULAR'S INTESTINAL.  Take 1 tablet daily.    Marland Kitchen OVER THE COUNTER MEDICATION VITAMIN D3 AND PROBIOTIC.  Take 1 capsule daily.    Marland Kitchen OVER THE COUNTER MEDICATION MAXIMIZE LIVING.  MEN'S VITAMIN.  Take 1 tablet daily    . OVER THE COUNTER MEDICATION MAXIMIZE LIVING.  Maxfit.  Take 1 tablet daily.    Marland Kitchen OVER THE COUNTER MEDICATION OPTIMAL LIVING fish oil.  Take 2 capsules daily.    . potassium chloride SA (K-DUR,KLOR-CON) 20 MEQ tablet Take 1 tablet (20 mEq total) by mouth daily. 30 tablet 3  . Pyridoxine HCl (B-6 PO) Take 1 tablet by mouth daily.    . sildenafil (VIAGRA) 100 MG tablet Take 1/2 to 1 tablet daily as needed 15 tablet 0  . traMADol (ULTRAM) 50 MG tablet Take 1 tablet (50 mg total) by mouth every 8 (eight) hours as needed. 30 tablet 0  . valACYclovir (VALTREX) 500 MG tablet Take 1 tablet (500 mg total) by mouth 2 (two) times daily. 6 tablet 0  . zolpidem (AMBIEN) 10 MG tablet Take 1 tablet (10 mg total) by mouth at bedtime as needed. 90 tablet 0  . [DISCONTINUED] potassium chloride (K-DUR) 10 MEQ tablet Take 1 tablet (10 mEq total) by mouth daily. 30 tablet 1   Current Facility-Administered Medications on File Prior to Visit  Medication Dose Route Frequency Provider Last Rate Last Dose  . triamcinolone acetonide (KENALOG) 10 MG/ML injection 10 mg  10 mg Other Once Richard Sikora, DPM        BP 134/82 mmHg  Pulse 70  Temp(Src) 97.5 F (36.4 C) (Oral)  Resp 16  Ht 6' (1.829 m)  Wt 327 lb 6.4 oz (148.508 kg)  BMI 44.39 kg/m2  SpO2 97%       Objective:   Physical Exam  Constitutional: He is oriented to person, place, and  time. He appears well-developed and well-nourished. No distress.  Neurological: He is alert and oriented to person, place, and time.  Psychiatric: He has a normal mood and affect. His behavior is normal. Judgment and thought content normal.          Assessment & Plan:

## 2014-11-06 NOTE — Assessment & Plan Note (Signed)
Commended pt on his continued weight loss. Advised goal of 1-2 pound weight loss per week.

## 2014-11-06 NOTE — Patient Instructions (Signed)
Continue wellbutrin. Follow up in 1 month.

## 2014-11-06 NOTE — Addendum Note (Signed)
Addended by: Tasia Catchings on: 11/06/2014 11:49 AM   Modules accepted: Medications

## 2014-11-07 ENCOUNTER — Encounter: Payer: Self-pay | Admitting: Internal Medicine

## 2014-11-07 ENCOUNTER — Ambulatory Visit (INDEPENDENT_AMBULATORY_CARE_PROVIDER_SITE_OTHER): Admitting: Internal Medicine

## 2014-11-07 VITALS — BP 128/88 | HR 68 | Ht 73.0 in | Wt 328.3 lb

## 2014-11-07 DIAGNOSIS — I10 Essential (primary) hypertension: Secondary | ICD-10-CM | POA: Diagnosis not present

## 2014-11-07 DIAGNOSIS — Z0000 Encounter for general adult medical examination without abnormal findings: Secondary | ICD-10-CM | POA: Diagnosis not present

## 2014-11-07 DIAGNOSIS — R635 Abnormal weight gain: Secondary | ICD-10-CM

## 2014-11-07 DIAGNOSIS — R0609 Other forms of dyspnea: Secondary | ICD-10-CM | POA: Diagnosis not present

## 2014-11-07 NOTE — Patient Instructions (Signed)
Your physician has requested that you have an exercise tolerance test. For further information please visit [email or web address]. Please also follow instruction sheet, as given.  Your physician recommends that you schedule a follow-up appointment in: 1 month with Dr. Debara Pickett
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
## 2014-11-17 ENCOUNTER — Ambulatory Visit (INDEPENDENT_AMBULATORY_CARE_PROVIDER_SITE_OTHER)

## 2014-11-17 ENCOUNTER — Encounter: Payer: Self-pay | Admitting: Podiatry

## 2014-11-17 ENCOUNTER — Ambulatory Visit (INDEPENDENT_AMBULATORY_CARE_PROVIDER_SITE_OTHER): Admitting: Podiatry

## 2014-11-17 VITALS — BP 123/71 | HR 70 | Resp 15

## 2014-11-17 DIAGNOSIS — M79671 Pain in right foot: Secondary | ICD-10-CM

## 2014-11-17 DIAGNOSIS — M7662 Achilles tendinitis, left leg: Secondary | ICD-10-CM

## 2014-11-17 DIAGNOSIS — M7731 Calcaneal spur, right foot: Secondary | ICD-10-CM | POA: Diagnosis not present

## 2014-11-17 DIAGNOSIS — M7661 Achilles tendinitis, right leg: Secondary | ICD-10-CM | POA: Diagnosis not present

## 2014-11-17 DIAGNOSIS — Z0189 Encounter for other specified special examinations: Secondary | ICD-10-CM | POA: Diagnosis not present

## 2014-11-17 NOTE — Patient Instructions (Signed)

## 2014-11-18 NOTE — Progress Notes (Signed)
Subjective:     Patient ID: Charles Nash, male   DOB: November 19, 1965, 49 y.o.   MRN: [id number]  HPI patient presents stating I been having a lot of problems and pain with the back of my heel left over right and it simply not responding to conservative care that I have tried   Review of Systems     Objective:   Physical Exam Neurovascular status intact muscle strength adequate with severe discomfort on the posterior aspect heel left over right lateral side with inflammation and fluid at the insertional point of the tendon into the calcaneus    Assessment:     Heel spur formation left over right with inflammation and fluid buildup noted posterior    Plan:     Reviewed condition and x-rays and discussed treatment options. Due to long-standing nature of condition it's been recommended that we pursue a more aggressive treatment we discussed surgery versus shockwave therapy. Patient is opted for shockwave therapy which I think we will be best and I advised the patient of this and discussed the treatment. Scheduled for shockwave therapy

## 2014-11-23 ENCOUNTER — Telehealth (HOSPITAL_COMMUNITY): Payer: Self-pay

## 2014-11-23 NOTE — Telephone Encounter (Signed)
Encounter complete. 

## 2014-11-28 ENCOUNTER — Ambulatory Visit (HOSPITAL_COMMUNITY)
Admission: RE | Admit: 2014-11-28 | Discharge: 2014-11-28 | Disposition: A | Discharge status: Home / Self Care | Source: Ambulatory Visit | Attending: Internal Medicine | Admitting: Internal Medicine

## 2014-11-28 DIAGNOSIS — R0609 Other forms of dyspnea: Secondary | ICD-10-CM | POA: Insufficient documentation

## 2014-11-29 LAB — EXERCISE TOLERANCE TEST
CHL CUP MPHR: 172 {beats}/min
CSEPEDS: 2 s
CSEPEW: 8.6 METS
CSEPHR: 91 %
CSEPPHR: 157 {beats}/min
Exercise duration (min): 7 min
RPE: 15
Rest HR: 89 {beats}/min

## 2014-12-05 ENCOUNTER — Telehealth: Payer: Self-pay | Admitting: *Deleted

## 2014-12-05 NOTE — Telephone Encounter (Signed)
Pt dropped off medical form needed for the Dept of [facility] Forwarded to [employer]. JG//CMA

## 2014-12-07 ENCOUNTER — Telehealth: Payer: Self-pay

## 2014-12-07 ENCOUNTER — Telehealth: Payer: Self-pay | Admitting: Family

## 2014-12-07 MED ORDER — METOPROLOL SUCCINATE ER 100 MG PO TB24: 100.0000 mg | ORAL_TABLET | Freq: Every day | ORAL | Status: DC

## 2014-12-07 NOTE — Telephone Encounter (Signed)
Patient came into the office and stated that his Metoprolol was sent to [facility] and he needed a 30 day supply sent to [facility]. Rx faxed.    KP

## 2014-12-07 NOTE — Telephone Encounter (Signed)
Refill sent. Notified pt. 

## 2014-12-07 NOTE — Telephone Encounter (Signed)
°  Relation to WO:EHOZ Call back [phone] Pharmacy:[facility]  Reason for call: pt states he is completely out of his metoprolol succinate (TOPROL-XL) 100 MG 24 hr

## 2014-12-11 ENCOUNTER — Telehealth: Payer: Self-pay | Admitting: Family

## 2014-12-11 ENCOUNTER — Ambulatory Visit (INDEPENDENT_AMBULATORY_CARE_PROVIDER_SITE_OTHER): Admitting: Family

## 2014-12-11 ENCOUNTER — Encounter: Payer: Self-pay | Admitting: Family

## 2014-12-11 VITALS — BP 138/80 | HR 84 | Temp 97.5°F | Resp 16 | Ht 72.0 in | Wt 331.4 lb

## 2014-12-11 DIAGNOSIS — F329 Major depressive disorder, single episode, unspecified: Secondary | ICD-10-CM

## 2014-12-11 DIAGNOSIS — I10 Essential (primary) hypertension: Secondary | ICD-10-CM

## 2014-12-11 DIAGNOSIS — F32A Depression, unspecified: Secondary | ICD-10-CM

## 2014-12-11 DIAGNOSIS — E119 Type 2 diabetes mellitus without complications: Secondary | ICD-10-CM | POA: Diagnosis not present

## 2014-12-11 MED ORDER — EMPAGLIFLOZIN 10 MG PO TABS: 10.0000 mg | ORAL_TABLET | Freq: Every day | ORAL | Status: DC

## 2014-12-11 MED ORDER — BUPROPION HCL ER (XL) 150 MG PO TB24: 150.0000 mg | ORAL_TABLET | Freq: Every day | ORAL | Status: DC

## 2014-12-11 MED ORDER — METOPROLOL SUCCINATE ER 50 MG PO TB24: 50.0000 mg | ORAL_TABLET | Freq: Every day | ORAL | Status: DC

## 2014-12-11 NOTE — Telephone Encounter (Signed)
Please advise below request?

## 2014-12-11 NOTE — Telephone Encounter (Signed)
Cancelled rx sent to [facility]. [facility].

## 2014-12-11 NOTE — Progress Notes (Signed)
Subjective:    Patient ID: Charles Nash, male    DOB: [birth year], 49 y.o.   MRN: [id number]  HPI  Charles Nash is a 49 yr old male who presents today for follow up.  1) Depression- he has been on wellbutrin for slightly longer than 1 month. Reports that he has only bee able to tolerate bid dosing due to dizziness.  Notes that he was extremely tired.  Attributed this to [place]. Stopped ambien 4 days ago and now feels less drowsy during the next day.  Overall mood is improved.    2) DM2-   Lab Results  Component Value Date   HGBA1C 6.6* 09/15/2014   HGBA1C 7.2* 06/13/2014   HGBA1C 6.6* 03/01/2014   Lab Results  Component Value Date   MICROALBUR 3.18* 11/08/2013   LDLCALC 107* 06/13/2014   CREATININE 0.93 10/05/2014   3) HTN- taking toprol xl 50mg  at bedtime.  BP Readings from Last 3 Encounters:  12/11/14 138/80  11/17/14 123/71  11/07/14 128/88     Review of Systems Reports chronic back pain, foot pain- reports that if he develops back spasm, he needs to have his wife help him clean himself.     see HPI  Past Medical History  Diagnosis Date  . Hyperlipidemia   . Diabetes mellitus   . Hypertension   . OSA (obstructive sleep apnea)   . Arthritis   . Allergy   . Personal history of colonic polyps   . GERD (gastroesophageal reflux disease)   . Fatty liver 08/06/2011  . Bilateral varicoceles   . Plantar fasciitis   . ADHD (attention deficit hyperactivity disorder)   . PTSD (post-traumatic stress disorder)   . Genital herpes 10/25/2014    Social History   Social History  . Marital Status: Married    Spouse Name: N/A  . Number of Children: 2  . Years of Education: N/A   Occupational History  . FOOTBALL COACH    Social History Main Topics  . Smoking status: Never Smoker   . Smokeless tobacco: Never Used  . Alcohol Use: No     Comment: occasional wine  . Drug Use: No  . Sexual Activity: Not on file   Other Topics Concern  . Not on file   Social  History Narrative   Regular exercise:  No   Caffeine use:  2--32oz cups daily          Past Surgical History  Procedure Laterality Date  . Knee surgery  08/04/08    Right knee-- medial meniscus repair, attenuation anterior cruciate [name] MD [facility])   . Ankle surgery    . Foot surgery    . Shoulder surgery    . Varicocele excision    . Plantar fascia release    . Nm myoview ltd  2010  . Doppler echocardiography  2010    Family History  Problem Relation Age of Onset  . Diabetes Mother   . Hypertension Mother   . Arthritis Mother   . Cancer Mother     uterine and breast cancer  . Hyperlipidemia Father   . Arthritis Father   . Cancer Father     thyroid, prostate cancer  . Other Father     mass in stomach  . Hyperlipidemia Maternal Grandmother   . Hypertension Maternal Grandmother   . Hyperlipidemia Maternal Grandfather   . Hypertension Maternal Grandfather   . Hyperlipidemia Paternal Grandmother   . Hypertension Paternal Grandmother   .  Hyperlipidemia Paternal Grandfather   . Hypertension Paternal Grandfather   . Heart attack Paternal Grandfather   . Sudden death Neg Hx   . Colon cancer Neg Hx   . Esophageal cancer Neg Hx   . Stomach cancer Neg Hx   . Rectal cancer Neg Hx     Allergies  Allergen Reactions  . Oxycodone-Acetaminophen Hives and Itching    Current Outpatient Prescriptions on File Prior to Visit  Medication Sig Dispense Refill  . aspirin 81 MG tablet Take 1 tablet (81 mg total) by mouth daily. 14 tablet 0  . atorvastatin (LIPITOR) 10 MG tablet Take 1 tablet (10 mg total) by mouth daily. 90 tablet 1  . Blood Glucose Monitoring Suppl (FREESTYLE LITE) DEVI Use to check blood sugar once a day.  E11.9 1 each 0  . buPROPion (WELLBUTRIN) 75 MG tablet One tab by mouth twice daily 180 tablet 0  . cetirizine (ZYRTEC) 10 MG tablet Take 1 tablet (10 mg total) by mouth daily. 30 tablet 11  . clomiPHENE (CLOMID) 50 MG tablet Take 50 mg by  mouth daily.    . empagliflozin (JARDIANCE) 10 MG TABS tablet Take 10 mg by mouth daily. 30 tablet 0  . FREESTYLE LITE test strip USE AS INSTRUCTED TO CHECK BLOOD SUGAR ONCE A DAY 100 each 1  . Lancets (FREESTYLE) lancets USE TO CHECK BLOOD SUGAR ONCE A DAY 100 each 1  . MAGNESIUM CITRATE PO Take 1 tablet by mouth at bedtime.    . metoprolol succinate (TOPROL-XL) 100 MG 24 hr tablet Take 1 tablet (100 mg total) by mouth at bedtime. Take with or immediately following a meal. 30 tablet 0  . Olmesartan-Amlodipine-HCTZ (TRIBENZOR) 40-10-25 MG TABS Take 1 tablet by mouth daily. 3 tablet 0  . omeprazole (PRILOSEC) 20 MG capsule TAKE 2 CAPSULES EVERY MORNING 180 capsule 1  . OVER THE COUNTER MEDICATION DR SHULAR'S INTESTINAL.  Take 1 tablet daily.    Marland Kitchen OVER THE COUNTER MEDICATION VITAMIN D3 AND PROBIOTIC.  Take 1 capsule daily.    Marland Kitchen OVER THE COUNTER MEDICATION MAXIMIZE LIVING.  MEN'S VITAMIN.  Take 1 tablet daily    . OVER THE COUNTER MEDICATION MAXIMIZE LIVING.  Maxfit.  Take 1 tablet daily.    Marland Kitchen OVER THE COUNTER MEDICATION OPTIMAL LIVING fish oil.  Take 2 capsules daily.    . potassium chloride SA (K-DUR,KLOR-CON) 20 MEQ tablet Take 1 tablet (20 mEq total) by mouth daily. 30 tablet 3  . Pyridoxine HCl (B-6 PO) Take 1 tablet by mouth daily.    . sildenafil (VIAGRA) 100 MG tablet Take 1/2 to 1 tablet daily as needed 15 tablet 0  . zolpidem (AMBIEN) 10 MG tablet Take 1 tablet (10 mg total) by mouth at bedtime as needed. 90 tablet 0  . [DISCONTINUED] potassium chloride (K-DUR) 10 MEQ tablet Take 1 tablet (10 mEq total) by mouth daily. 30 tablet 1   Current Facility-Administered Medications on File Prior to Visit  Medication Dose Route Frequency Provider Last Rate Last Dose  . triamcinolone acetonide (KENALOG) 10 MG/ML injection 10 mg  10 mg Other Once Richard Sikora, DPM        BP 138/80 mmHg  Pulse 84  Temp(Src) 97.5 F (36.4 C) (Oral)  Resp 16  Ht 6' (1.829 m)  Wt 331 lb 6.4 oz (150.322 kg)   BMI 44.94 kg/m2  SpO2 98%    Objective:   Physical Exam  Constitutional: He is oriented to person, place, and time. He  appears well-developed and well-nourished. No distress.  HENT:  Head: Normocephalic and atraumatic.  Cardiovascular: Normal rate and regular rhythm.   No murmur heard. Pulmonary/Chest: Effort normal and breath sounds normal. No respiratory distress. He has no wheezes. He has no rales.  Musculoskeletal: He exhibits no edema.  Neurological: He is alert and oriented to person, place, and time.  Bilateral UE/LE strength is 5/5  Skin: Skin is warm and dry.  Psychiatric: He has a normal mood and affect. His behavior is normal. Thought content normal.          Assessment & Plan:

## 2014-12-11 NOTE — Telephone Encounter (Signed)
Notified pt. 

## 2014-12-11 NOTE — Telephone Encounter (Signed)
Relation to pt: self  Call back number: [phone] [facility]acy[facility]  Reason for call:  Patient states co pay for empagliflozin (JARDIANCE) 10 MG TABS tablet  was $393.00 wants Rx [facility] in the meantime and will take Januvia.

## 2014-12-11 NOTE — Progress Notes (Signed)
Pre visit review using our clinic review tool, if applicable. No additional management support is needed unless otherwise documented below in the visit note. 

## 2014-12-11 NOTE — Patient Instructions (Signed)
Change Wellbutrin to 150mg  XL once daily. Follow up in 1 month.

## 2014-12-11 NOTE — Assessment & Plan Note (Signed)
Stable, but he is only taking AM dose. Will see if he can tolerate changing to XR once daily at 150mg . Mood is improved.
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
## 2014-12-11 NOTE — Assessment & Plan Note (Signed)
BP stable on current dose of toprol xl 50mg  once daily.  Continue at this dose.

## 2014-12-20 ENCOUNTER — Telehealth: Payer: Self-pay | Admitting: Family

## 2014-12-20 NOTE — Telephone Encounter (Signed)
Pt called stating he needs you to call him regarding his [place] paperwork.

## 2014-12-20 NOTE — Telephone Encounter (Signed)
[name]--do you still have form or was it completed?

## 2014-12-21 ENCOUNTER — Ambulatory Visit: Admitting: Podiatry

## 2014-12-21 NOTE — Telephone Encounter (Signed)
The patient has been made aware and verbalized understanding.      KP 

## 2014-12-21 NOTE — Telephone Encounter (Signed)
Pt is calling back to check on this? 

## 2014-12-21 NOTE — Telephone Encounter (Signed)
Please notify pt that form will be available for pick up tomorrow AM.

## 2014-12-22 NOTE — Telephone Encounter (Signed)
Original placed at front desk for pick up and copy sent for scanning.

## 2014-12-28 ENCOUNTER — Encounter (HOSPITAL_BASED_OUTPATIENT_CLINIC_OR_DEPARTMENT_OTHER): Payer: Self-pay | Admitting: Emergency Medicine

## 2014-12-28 ENCOUNTER — Ambulatory Visit: Admitting: Podiatry

## 2014-12-28 ENCOUNTER — Emergency Department (HOSPITAL_BASED_OUTPATIENT_CLINIC_OR_DEPARTMENT_OTHER)
Admission: EM | Admit: 2014-12-28 | Discharge: 2014-12-28 | Disposition: A | Discharge status: Home / Self Care | Attending: Emergency Medicine | Admitting: Emergency Medicine

## 2014-12-28 DIAGNOSIS — S3992XA Unspecified injury of lower back, initial encounter: Secondary | ICD-10-CM | POA: Diagnosis not present

## 2014-12-28 DIAGNOSIS — Y9389 Activity, other specified: Secondary | ICD-10-CM | POA: Insufficient documentation

## 2014-12-28 DIAGNOSIS — I10 Essential (primary) hypertension: Secondary | ICD-10-CM | POA: Diagnosis not present

## 2014-12-28 DIAGNOSIS — E119 Type 2 diabetes mellitus without complications: Secondary | ICD-10-CM | POA: Insufficient documentation

## 2014-12-28 DIAGNOSIS — Y92410 Unspecified street and highway as the place of occurrence of the external cause: Secondary | ICD-10-CM | POA: Diagnosis not present

## 2014-12-28 DIAGNOSIS — Y998 Other external cause status: Secondary | ICD-10-CM | POA: Diagnosis not present

## 2014-12-28 DIAGNOSIS — S199XXA Unspecified injury of neck, initial encounter: Secondary | ICD-10-CM | POA: Diagnosis not present
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
## 2014-12-28 NOTE — ED Notes (Signed)
Patient chose to leave due to wait. The patient reports that he will go to his PMD in the am

## 2014-12-29 ENCOUNTER — Encounter: Payer: Self-pay | Admitting: Family

## 2014-12-29 ENCOUNTER — Ambulatory Visit (INDEPENDENT_AMBULATORY_CARE_PROVIDER_SITE_OTHER): Admitting: Family

## 2014-12-29 VITALS — BP 133/81 | HR 70 | Temp 98.3°F | Resp 18 | Ht 72.0 in | Wt 330.4 lb

## 2014-12-29 DIAGNOSIS — S161XXA Strain of muscle, fascia and tendon at neck level, initial encounter: Secondary | ICD-10-CM | POA: Diagnosis not present

## 2014-12-29 DIAGNOSIS — S39012A Strain of muscle, fascia and tendon of lower back, initial encounter: Secondary | ICD-10-CM | POA: Diagnosis not present

## 2014-12-29 DIAGNOSIS — Z23 Encounter for immunization: Secondary | ICD-10-CM | POA: Diagnosis not present

## 2014-12-29 MED ORDER — CYCLOBENZAPRINE HCL 5 MG PO TABS: 5.0000 mg | ORAL_TABLET | Freq: Three times a day (TID) | ORAL | Status: DC | PRN

## 2014-12-29 NOTE — Patient Instructions (Signed)
You may use ibuprofen as needed for pain short term, then switch to tylenol as needed. You may use flexeril as needed for muscle spasm. Call if Back/neck pain worsens or if not improved in the next 2-3 weeks.
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
## 2015-01-02 ENCOUNTER — Telehealth: Payer: Self-pay | Admitting: Family
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
## 2015-01-04 ENCOUNTER — Ambulatory Visit: Admitting: Podiatry

## 2015-01-08 ENCOUNTER — Ambulatory Visit (INDEPENDENT_AMBULATORY_CARE_PROVIDER_SITE_OTHER): Admitting: Internal Medicine

## 2015-01-08 ENCOUNTER — Encounter: Payer: Self-pay | Admitting: Internal Medicine

## 2015-01-08 ENCOUNTER — Encounter: Payer: Self-pay | Admitting: Family

## 2015-01-08 VITALS — BP 144/92 | HR 88 | Ht 73.0 in | Wt 335.7 lb

## 2015-01-08 DIAGNOSIS — I10 Essential (primary) hypertension: Secondary | ICD-10-CM

## 2015-01-08 DIAGNOSIS — R0609 Other forms of dyspnea: Secondary | ICD-10-CM

## 2015-01-08 DIAGNOSIS — M545 Low back pain: Secondary | ICD-10-CM

## 2015-01-08 MED ORDER — MELOXICAM 7.5 MG PO TABS: 7.5000 mg | ORAL_TABLET | Freq: Every day | ORAL | Status: DC

## 2015-01-08 MED ORDER — METOPROLOL SUCCINATE ER 50 MG PO TB24: ORAL_TABLET | ORAL | Status: DC
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
## 2015-01-08 NOTE — Patient Instructions (Signed)
Increase metoprolol XL to 75 mg (1 1/2 tablets daily).  Follow-up in 6 months.

## 2015-01-08 NOTE — Telephone Encounter (Signed)
Would you please contact [facility] and [facility] rx for meloxicam that I sent in error. Thank you!

## 2015-01-09 ENCOUNTER — Other Ambulatory Visit: Payer: Self-pay | Admitting: Family

## 2015-01-09 NOTE — Telephone Encounter (Signed)
Cancellation notice faxed to [facility].

## 2015-01-15 ENCOUNTER — Other Ambulatory Visit: Payer: Self-pay | Admitting: Family

## 2015-01-15 MED ORDER — BUPROPION HCL ER (XL) 150 MG PO TB24: 150.0000 mg | ORAL_TABLET | Freq: Every day | ORAL | Status: DC

## 2015-01-15 MED ORDER — OLMESARTAN-AMLODIPINE-HCTZ 40-10-25 MG PO TABS: 1.0000 | ORAL_TABLET | Freq: Every day | ORAL | Status: DC

## 2015-01-15 NOTE — Telephone Encounter (Signed)
Received refill request from [facility] for wellbutrin 75mg  twice a day.  Current med list is for XL, 150mg  once a day. Verified dose with pt and sent refills. Pt also requests 1 week supply of tribenzor to [facility] as mail order has not reached him yet. Rx sent.

## 2015-01-16 NOTE — Telephone Encounter (Signed)
Attempted to verify Rx with pt and was unable to leave message as voice mailbox was full.

## 2015-01-16 NOTE — Telephone Encounter (Signed)
I believe he should still be taking. Please confirm with pt that he has been taking and if so, ok to send refill.

## 2015-01-16 NOTE — Telephone Encounter (Signed)
[name]--received hydralazine request from [facility]. Last rx we sent was 12/2013 and med is no longer on med list.  I am unable to see where we stopped it?  Please advise if pt should still be taking medication?

## 2015-01-17 MED ORDER — HYDRALAZINE HCL 50 MG PO TABS: 50.0000 mg | ORAL_TABLET | Freq: Every day | ORAL | Status: DC

## 2015-01-17 NOTE — Telephone Encounter (Signed)
Spoke with pt. States he has bottle of hydralazine 50mg , take 1 tablet a day. Thinks that the [place] sent refill on it last but confirms he takes it every day.  Rx sent with once daily directions.

## 2015-01-18 ENCOUNTER — Encounter: Payer: Self-pay | Admitting: Family

## 2015-01-19 ENCOUNTER — Telehealth: Payer: Self-pay | Admitting: *Deleted

## 2015-01-19 MED ORDER — POTASSIUM CHLORIDE CRYS ER 20 MEQ PO TBCR: 20.0000 meq | EXTENDED_RELEASE_TABLET | Freq: Every day | ORAL | Status: DC

## 2015-01-19 NOTE — Telephone Encounter (Signed)
Received fax from [facility] requesting refill on klor con [id number].  Refill sent.

## 2015-01-20 ENCOUNTER — Encounter (HOSPITAL_BASED_OUTPATIENT_CLINIC_OR_DEPARTMENT_OTHER): Payer: Self-pay | Admitting: Emergency Medicine

## 2015-01-20 ENCOUNTER — Emergency Department (HOSPITAL_BASED_OUTPATIENT_CLINIC_OR_DEPARTMENT_OTHER)
Admission: EM | Admit: 2015-01-20 | Discharge: 2015-01-20 | Disposition: A | Discharge status: Home / Self Care | Attending: Emergency Medicine | Admitting: Emergency Medicine

## 2015-01-20 DIAGNOSIS — M7918 Myalgia, other site: Secondary | ICD-10-CM

## 2015-01-20 DIAGNOSIS — E119 Type 2 diabetes mellitus without complications: Secondary | ICD-10-CM | POA: Diagnosis not present

## 2015-01-20 DIAGNOSIS — Z7982 Long term (current) use of aspirin: Secondary | ICD-10-CM | POA: Diagnosis not present

## 2015-01-20 DIAGNOSIS — Z7951 Long term (current) use of inhaled steroids: Secondary | ICD-10-CM | POA: Insufficient documentation

## 2015-01-20 DIAGNOSIS — Z79899 Other long term (current) drug therapy: Secondary | ICD-10-CM | POA: Insufficient documentation

## 2015-01-20 DIAGNOSIS — K219 Gastro-esophageal reflux disease without esophagitis: Secondary | ICD-10-CM | POA: Diagnosis not present

## 2015-01-20 DIAGNOSIS — F909 Attention-deficit hyperactivity disorder, unspecified type: Secondary | ICD-10-CM | POA: Insufficient documentation

## 2015-01-20 DIAGNOSIS — E785 Hyperlipidemia, unspecified: Secondary | ICD-10-CM | POA: Diagnosis not present

## 2015-01-20 DIAGNOSIS — Z8619 Personal history of other infectious and parasitic diseases: Secondary | ICD-10-CM | POA: Insufficient documentation

## 2015-01-20 DIAGNOSIS — I10 Essential (primary) hypertension: Secondary | ICD-10-CM | POA: Diagnosis not present

## 2015-01-20 DIAGNOSIS — Z8669 Personal history of other diseases of the nervous system and sense organs: Secondary | ICD-10-CM | POA: Diagnosis not present

## 2015-01-20 DIAGNOSIS — M1990 Unspecified osteoarthritis, unspecified site: Secondary | ICD-10-CM | POA: Diagnosis not present

## 2015-01-20 DIAGNOSIS — M791 Myalgia: Secondary | ICD-10-CM | POA: Diagnosis not present

## 2015-01-20 DIAGNOSIS — Z86010 Personal history of colonic polyps: Secondary | ICD-10-CM | POA: Diagnosis not present

## 2015-01-20 DIAGNOSIS — R109 Unspecified abdominal pain: Secondary | ICD-10-CM | POA: Diagnosis present

## 2015-01-20 LAB — URINALYSIS, ROUTINE W REFLEX MICROSCOPIC
BILIRUBIN URINE: NEGATIVE
GLUCOSE, UA: NEGATIVE mg/dL
HGB URINE DIPSTICK: NEGATIVE
KETONES UR: NEGATIVE mg/dL
Leukocytes, UA: NEGATIVE
Nitrite: NEGATIVE
PROTEIN: NEGATIVE mg/dL
Specific Gravity, Urine: 1.012 (ref 1.005–1.030)
UROBILINOGEN UA: 0.2 mg/dL (ref 0.0–1.0)
pH: 6.5 (ref 5.0–8.0)

## 2015-01-20 MED ORDER — IBUPROFEN 600 MG PO TABS: 600.0000 mg | ORAL_TABLET | Freq: Four times a day (QID) | ORAL | Status: DC | PRN

## 2015-01-20 MED ORDER — KETOROLAC TROMETHAMINE 60 MG/2ML IM SOLN
60.0000 mg | Freq: Once | INTRAMUSCULAR | Status: AC
  Administered 2015-01-20: 60 mg via INTRAMUSCULAR
  Filled 2015-01-20: qty 2

## 2015-01-20 NOTE — Discharge Instructions (Signed)
You were evaluated in the ED for your right sided pain. There does not appear to be an emergent cause her symptoms at this time. Your symptoms are likely due to muscle pains. Please take your Motrin as prescribed. Follow-up with your doctor in 1 week for reevaluation as needed. Return to ED for worsening symptoms.  Muscle Pain, Adult Muscle pain (myalgia) may be caused by many things, including:  Overuse or muscle strain, especially if you are not in shape. This is the most common cause of muscle pain.  Injury.  Bruises.  Viruses, such as the flu.  Infectious diseases.  Fibromyalgia, which is a chronic condition that causes muscle tenderness, fatigue, and headache.  Autoimmune diseases, including lupus.  Certain drugs, including ACE inhibitors and statins. Muscle pain may be mild or severe. In most cases, the pain lasts only a short time and goes away without treatment. To diagnose the cause of your muscle pain, your health care provider will take your medical history. This means he or she will ask you when your muscle pain began and what has been happening. If you have not had muscle pain for very long, your health care provider may want to wait before doing much testing. If your muscle pain has lasted a long time, your health care provider may want to run tests right away. If your health care provider thinks your muscle pain may be caused by illness, you may need to have additional tests to rule out certain conditions.  Treatment for muscle pain depends on the cause. Home care is often enough to relieve muscle pain. Your health care provider may also prescribe anti-inflammatory medicine. HOME CARE INSTRUCTIONS Watch your condition for any changes. The following actions may help to lessen any discomfort you are feeling:  Only take over-the-counter or prescription medicines as directed by your health care provider.  Apply ice to the sore muscle:  Put ice in a plastic bag.  Place a  towel between your skin and the bag.  Leave the ice on for 15-20 minutes, 3-4 times a day.  You may alternate applying hot and cold packs to the muscle as directed by your health care provider.  If overuse is causing your muscle pain, slow down your activities until the pain goes away.  Remember that it is normal to feel some muscle pain after starting a workout program. Muscles that have not been used often will be sore at first.  Do regular, gentle exercises if you are not usually active.  Warm up before exercising to lower your risk of muscle pain.  Do not continue working out if the pain is very bad. Bad pain could mean you have injured a muscle. SEEK MEDICAL CARE IF:  Your muscle pain gets worse, and medicines do not help.  You have muscle pain that lasts longer than 3 days.  You have a rash or fever along with muscle pain.  You have muscle pain after a tick bite.  You have muscle pain while working out, even though you are in good physical condition.  You have redness, soreness, or swelling along with muscle pain.  You have muscle pain after starting a new medicine or changing the dose of a medicine. SEEK IMMEDIATE MEDICAL CARE IF:  You have trouble breathing.  You have trouble swallowing.  You have muscle pain along with a stiff neck, fever, and vomiting.  You have severe muscle weakness or cannot move part of your body. MAKE SURE YOU:   Understand  these instructions.  Will watch your condition.  Will get help right away if you are not doing well or get worse.   This information is not intended to replace advice given to you by your health care provider. Make sure you discuss any questions you have with your health care provider.   Document Released: 02/20/2006 Document Revised: 04/21/2014 Document Reviewed: 01/25/2013 Elsevier Interactive Patient Education [employer].

## 2015-01-20 NOTE — ED Notes (Signed)
Has taken no meds for pain relief per pt statement

## 2015-01-20 NOTE — ED Notes (Signed)
Pain is worse with coughing and sneezing

## 2015-01-20 NOTE — ED Notes (Signed)
Patient states that he started to have pain to the right hip and flank area when he coughed or sneezed.

## 2015-01-20 NOTE — ED Provider Notes (Signed)
CSN: [id number]     Arrival date & time 01/20/15  1504 History   First MD Initiated Contact with Patient 01/20/15 1548     Chief Complaint  Patient presents with  . Flank Pain     (Consider location/radiation/quality/duration/timing/severity/associated sxs/prior Treatment) HPI Charles Nash is a 49 y.o. male who comes in for evaluation of right flank pain. Patient states since Wednesday he has had progressively worsening right-sided flank pain. He reports it is worse when he sneezes or coughs or if he moves in certain positions. He denies any fevers, chills, chest pain or shortness of breath, nausea or vomiting, other abdominal pain, cough, difficulties urinating, dysuria, hematuria, back pain. Rates his current discomfort as a 4/10. He has not tried anything to improve his symptoms. No other modifying factors.  Past Medical History  Diagnosis Date  . Hyperlipidemia   . Diabetes mellitus   . Hypertension   . OSA (obstructive sleep apnea)   . Arthritis   . Allergy   . Personal history of colonic polyps   . GERD (gastroesophageal reflux disease)   . Fatty liver 08/06/2011  . Bilateral varicoceles   . Plantar fasciitis   . ADHD (attention deficit hyperactivity disorder)   . PTSD (post-traumatic stress disorder)   . Genital herpes 10/25/2014   Past Surgical History  Procedure Laterality Date  . Knee surgery  08/04/08    Right knee-- medial meniscus repair, attenuation anterior cruciate [name] MD [facility])   . Ankle surgery    . Foot surgery    . Shoulder surgery    . Varicocele excision    . Plantar fascia release    . Nm myoview ltd  2010  . Doppler echocardiography  2010   Family History  Problem Relation Age of Onset  . Diabetes Mother   . Hypertension Mother   . Arthritis Mother   . Cancer Mother     uterine and breast cancer  . Hyperlipidemia Father   . Arthritis Father   . Cancer Father     thyroid, prostate cancer  . Other Father      mass in stomach  . Hyperlipidemia Maternal Grandmother   . Hypertension Maternal Grandmother   . Hyperlipidemia Maternal Grandfather   . Hypertension Maternal Grandfather   . Hyperlipidemia Paternal Grandmother   . Hypertension Paternal Grandmother   . Hyperlipidemia Paternal Grandfather   . Hypertension Paternal Grandfather   . Heart attack Paternal Grandfather   . Sudden death Neg Hx   . Colon cancer Neg Hx   . Esophageal cancer Neg Hx   . Stomach cancer Neg Hx   . Rectal cancer Neg Hx    Social History  Substance Use Topics  . Smoking status: Never Smoker   . Smokeless tobacco: Never Used  . Alcohol Use: No     Comment: occasional wine    Review of Systems A 10 point review of systems was completed and was negative except for pertinent positives and negatives as mentioned in the history of present illness     Allergies  Oxycodone-acetaminophen  Home Medications   Prior to Admission medications   Medication Sig Start Date End Date Taking? Authorizing Provider  aspirin 81 MG tablet Take 1 tablet (81 mg total) by mouth daily. 05/12/14   Debbrah Alar, NP  atorvastatin (LIPITOR) 10 MG tablet Take 1 tablet (10 mg total) by mouth daily. 09/13/14   Debbrah Alar, NP  Blood Glucose Monitoring Suppl (FREESTYLE LITE) DEVI Use to  check blood sugar once a day.  E11.9 09/13/14   Debbrah Alar, NP  buPROPion (WELLBUTRIN XL) 150 MG 24 hr tablet Take 1 tablet (150 mg total) by mouth daily. 01/15/15   Debbrah Alar, NP  cetirizine (ZYRTEC) 10 MG tablet Take 1 tablet (10 mg total) by mouth daily. 02/02/12   Debbrah Alar, NP  clomiPHENE (CLOMID) 50 MG tablet Take 50 mg by mouth daily.    Historical Provider, MD  cyclobenzaprine (FLEXERIL) 5 MG tablet Take 1 tablet (5 mg total) by mouth 3 (three) times daily as needed for muscle spasms. 12/29/14   Debbrah Alar, NP  empagliflozin (JARDIANCE) 10 MG TABS tablet Take 10 mg by mouth daily. 12/11/14   Debbrah Alar,  NP  fluticasone (FLONASE) 50 MCG/ACT nasal spray Place 2 sprays into both nostrils as needed. 12/28/14   Historical Provider, MD  FREESTYLE LITE test strip USE AS INSTRUCTED TO CHECK BLOOD SUGAR ONCE A DAY 10/04/14   Debbrah Alar, NP  hydrALAZINE (APRESOLINE) 50 MG tablet Take 1 tablet (50 mg total) by mouth daily. 01/17/15   Debbrah Alar, NP  ibuprofen (ADVIL,MOTRIN) 600 MG tablet Take 1 tablet (600 mg total) by mouth every 6 (six) hours as needed. 01/20/15   Comer Locket, PA-C  Lancets (FREESTYLE) lancets USE TO CHECK BLOOD SUGAR ONCE A DAY 10/04/14   Debbrah Alar, NP  MAGNESIUM CITRATE PO Take 1 tablet by mouth at bedtime.    Historical Provider, MD  meloxicam (MOBIC) 7.5 MG tablet Take 1 tablet (7.5 mg total) by mouth daily. 01/08/15   Debbrah Alar, NP  metoprolol succinate (TOPROL-XL) 50 MG 24 hr tablet Take 1 and 1/2 tablet of 50 mg by mouth daily 01/08/15   Pixie Casino, MD  Olmesartan-Amlodipine-HCTZ [facility]) 40-10-25 MG TABS Take 1 tablet by mouth daily. 01/15/15   Debbrah Alar, NP  omeprazole (PRILOSEC) 20 MG capsule TAKE 2 CAPSULES EVERY MORNING 09/15/14   Debbrah Alar, NP  OVER THE COUNTER MEDICATION DR SHULAR'S INTESTINAL.  Take 1 tablet daily.    Historical Provider, MD  OVER THE COUNTER MEDICATION VITAMIN D3 AND PROBIOTIC.  Take 1 capsule daily.    Historical Provider, MD  OVER THE COUNTER MEDICATION MAXIMIZE LIVING.  MEN'S VITAMIN.  Take 1 tablet daily    Historical Provider, MD  OVER THE COUNTER MEDICATION MAXIMIZE LIVING.  Maxfit.  Take 1 tablet daily.    Historical Provider, MD  OVER THE COUNTER MEDICATION OPTIMAL LIVING fish oil.  Take 2 capsules daily.    Historical Provider, MD  potassium chloride SA (K-DUR,KLOR-CON) 20 MEQ tablet Take 1 tablet (20 mEq total) by mouth daily. 01/19/15   Debbrah Alar, NP  Pyridoxine HCl (B-6 PO) Take 1 tablet by mouth daily.    Historical Provider, MD  sildenafil (VIAGRA) 100 MG tablet Take 1/2 to 1  tablet daily as needed 03/30/13   Debbrah Alar, NP  zolpidem (AMBIEN) 10 MG tablet Take 1 tablet (10 mg total) by mouth at bedtime as needed. 10/03/14   Mosie Lukes, MD   BP 148/80 mmHg  Pulse 78  Temp(Src) 98.4 F (36.9 C) (Oral)  Resp 18  Ht 6\' 1"  (1.854 m)  Wt 330 lb (149.687 kg)  BMI 43.55 kg/m2  SpO2 99% Physical Exam  Constitutional:  Awake, alert, nontoxic appearance.  HENT:  Head: Atraumatic.  Eyes: Right eye exhibits no discharge. Left eye exhibits no discharge.  Neck: Neck supple.  Pulmonary/Chest: Effort normal. He exhibits no tenderness.  Abdominal: Soft. There is no  tenderness. There is no rebound.  Musculoskeletal: He exhibits no tenderness.  Baseline ROM, no obvious new focal weakness. Area of focal tenderness to palpation over the superior right oblique. No rib pain. No bony tenderness or crepitus. No lesions or deformities to suggest zoster. No CVA tenderness.  Neurological:  Mental status and motor strength appears baseline for patient and situation.  Skin: No rash noted.  Psychiatric: He has a normal mood and affect.  Nursing note and vitals reviewed.   ED Course  Procedures (including critical care time) Labs Review Labs Reviewed  URINALYSIS, ROUTINE W REFLEX MICROSCOPIC (NOT AT [facility])    Imaging Review No results found. I have personally reviewed and evaluated these images and lab results as part of my medical decision-making.   EKG Interpretation None     Meds given in ED:  Medications  ketorolac (TORADOL) injection 60 mg (60 mg Intramuscular Given 01/20/15 1658)    Discharge Medication List as of 01/20/2015  5:26 PM    START taking these medications   Details  ibuprofen (ADVIL,MOTRIN) 600 MG tablet Take 1 tablet (600 mg total) by mouth every 6 (six) hours as needed., Starting 01/20/2015, Until Discontinued, Print       Filed Vitals:   01/20/15 1512 01/20/15 1701  BP: 150/75 148/80  Pulse: 83 78  Temp: 98.4 F (36.9 C)    TempSrc: Oral   Resp: 16 18  Height: 6\' 1"  (1.854 m)   Weight: 330 lb (149.687 kg)   SpO2: 100% 99%    MDM  Vitals stable - WNL -afebrile Pt resting comfortably in ED. PE--focal tenderness to palpation in right flank over the obliques. Consistent with MSK injury. Labwork-urinalysis clean.  DDX--low suspicion for intra-abdominal pathology. Examination is consistent with MSK injury. Will treat with short course anti-inflammatory's and have patient follow up PCP.  I discussed all relevant lab findings and imaging results with pt and they verbalized understanding. Discussed f/u with PCP within 48 hrs and return precautions, pt very amenable to plan.  Final diagnoses:  Flank pain  Musculoskeletal pain        Comer Locket, PA-C 01/20/15 2135  Tanna Furry, MD 01/20/15 2241
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
## 2015-01-20 NOTE — ED Notes (Signed)
States started yesterday on a 6 day regimen of prednisone by [employer]

## 2015-01-22 ENCOUNTER — Telehealth: Payer: Self-pay | Admitting: *Deleted

## 2015-01-22 MED ORDER — ZOLPIDEM TARTRATE 10 MG PO TABS: 10.0000 mg | ORAL_TABLET | Freq: Every evening | ORAL | Status: DC | PRN

## 2015-01-22 NOTE — Telephone Encounter (Signed)
Received fax from [place] for [place], 90 day supply.  Last Rx 10/03/14, #90 x no refills. UDS is due now. Awaiting response from [facility] re: UDS status.  Rx printed and forwarded to Dr Charlett Blake for approval.

## 2015-01-23 NOTE — Telephone Encounter (Signed)
Pt has f/u 01/26/15 with PCP, will obtain UDS at that visit.  Refill faxed to pharmacy.

## 2015-01-26 ENCOUNTER — Ambulatory Visit (INDEPENDENT_AMBULATORY_CARE_PROVIDER_SITE_OTHER): Admitting: Family

## 2015-01-26 ENCOUNTER — Other Ambulatory Visit: Payer: Self-pay

## 2015-01-26 ENCOUNTER — Encounter: Payer: Self-pay | Admitting: Family

## 2015-01-26 VITALS — BP 134/75 | HR 83 | Temp 98.4°F | Resp 18 | Ht 72.0 in | Wt 337.0 lb

## 2015-01-26 DIAGNOSIS — E119 Type 2 diabetes mellitus without complications: Secondary | ICD-10-CM

## 2015-01-26 DIAGNOSIS — M545 Low back pain: Secondary | ICD-10-CM

## 2015-01-26 DIAGNOSIS — E876 Hypokalemia: Secondary | ICD-10-CM

## 2015-01-26 LAB — MICROALBUMIN / CREATININE URINE RATIO
Creatinine,U: 350.8 mg/dL
Microalb Creat Ratio: 0.7 mg/g (ref 0.0–30.0)
Microalb, Ur: 2.3 mg/dL — ABNORMAL HIGH (ref 0.0–1.9)

## 2015-01-26 LAB — BASIC METABOLIC PANEL
BUN: 12 mg/dL (ref 6–23)
CALCIUM: 9.2 mg/dL (ref 8.4–10.5)
CO2: 25 meq/L (ref 19–32)
Chloride: 103 mEq/L (ref 96–112)
Creatinine, Ser: 0.98 mg/dL (ref 0.40–1.50)
GFR: 104.74 mL/min (ref >60.00)
GLUCOSE: 170 mg/dL — AB (ref 70–99)
POTASSIUM: 3.4 meq/L — AB (ref 3.5–5.1)
Sodium: 138 mEq/L (ref 135–145)

## 2015-01-26 LAB — HEMOGLOBIN A1C: Hgb A1c MFr Bld: 7 % — ABNORMAL HIGH (ref 4.6–6.5)

## 2015-01-26 NOTE — Progress Notes (Signed)
Pre visit review using our clinic review tool, if applicable. No additional management support is needed unless otherwise documented below in the visit note. 

## 2015-01-26 NOTE — Patient Instructions (Addendum)
Please complete lab work and Urine drug screen.  Restart potassium.  Stop motrin. Start meloxicam once daily for 2 weeks.  Add flexeril 5mg  at bedtime as needed.   Call if symptoms worsen or if not improved in 2 weeks.

## 2015-01-26 NOTE — Progress Notes (Signed)
Subjective:    Patient ID: Charles Nash, male    DOB: 1965-05-05, 49 y.o.   MRN: [id number]  HPI  Charles Nash is a 49 yr old male who presents today with complaint of right sided flank pain.  Pain is made worse with certain movements.  Worse with sneezing coughing.  Reports that shot of toradol helped briefly in ED. Records are reviewed from ED and from his visit to [facility]- lumbar x ray noted:  Mild multilevel degenerative disc disease and lumbar spondylosis.  Reports that ibuprofen helped some.      Review of Systems  Gastrointestinal: Negative for nausea, vomiting and diarrhea.  Musculoskeletal: Positive for back pain.   See HPI  Past Medical History  Diagnosis Date  . Hyperlipidemia   . Diabetes mellitus   . Hypertension   . OSA (obstructive sleep apnea)   . Arthritis   . Allergy   . Personal history of colonic polyps   . GERD (gastroesophageal reflux disease)   . Fatty liver 08/06/2011  . Bilateral varicoceles   . Plantar fasciitis   . ADHD (attention deficit hyperactivity disorder)   . PTSD (post-traumatic stress disorder)   . Genital herpes 10/25/2014    Social History   Social History  . Marital Status: Married    Spouse Name: N/A  . Number of Children: 2  . Years of Education: N/A   Occupational History  . FOOTBALL COACH    Social History Main Topics  . Smoking status: Never Smoker   . Smokeless tobacco: Never Used  . Alcohol Use: No     Comment: occasional wine  . Drug Use: No  . Sexual Activity: Not on file   Other Topics Concern  . Not on file   Social History Narrative   Regular exercise:  No   Caffeine use:  2--32oz cups daily          Past Surgical History  Procedure Laterality Date  . Knee surgery  08/04/08    Right knee-- medial meniscus repair, attenuation anterior cruciate [name] MD [facility])   . Ankle surgery    . Foot surgery    . Shoulder surgery    . Varicocele excision    . Plantar fascia  release    . Nm myoview ltd  2010  . Doppler echocardiography  2010    Family History  Problem Relation Age of Onset  . Diabetes Mother   . Hypertension Mother   . Arthritis Mother   . Cancer Mother     uterine and breast cancer  . Hyperlipidemia Father   . Arthritis Father   . Cancer Father     thyroid, prostate cancer  . Other Father     mass in stomach  . Hyperlipidemia Maternal Grandmother   . Hypertension Maternal Grandmother   . Hyperlipidemia Maternal Grandfather   . Hypertension Maternal Grandfather   . Hyperlipidemia Paternal Grandmother   . Hypertension Paternal Grandmother   . Hyperlipidemia Paternal Grandfather   . Hypertension Paternal Grandfather   . Heart attack Paternal Grandfather   . Sudden death Neg Hx   . Colon cancer Neg Hx   . Esophageal cancer Neg Hx   . Stomach cancer Neg Hx   . Rectal cancer Neg Hx     Allergies  Allergen Reactions  . Oxycodone-Acetaminophen Hives and Itching    Current Outpatient Prescriptions on File Prior to Visit  Medication Sig Dispense Refill  . aspirin 81 MG tablet  Take 1 tablet (81 mg total) by mouth daily. 14 tablet 0  . atorvastatin (LIPITOR) 10 MG tablet Take 1 tablet (10 mg total) by mouth daily. 90 tablet 1  . Blood Glucose Monitoring Suppl (FREESTYLE LITE) DEVI Use to check blood sugar once a day.  E11.9 1 each 0  . buPROPion (WELLBUTRIN XL) 150 MG 24 hr tablet Take 1 tablet (150 mg total) by mouth daily. 90 tablet 1  . cetirizine (ZYRTEC) 10 MG tablet Take 1 tablet (10 mg total) by mouth daily. 30 tablet 11  . cyclobenzaprine (FLEXERIL) 5 MG tablet Take 1 tablet (5 mg total) by mouth 3 (three) times daily as needed for muscle spasms. 20 tablet 0  . fluticasone (FLONASE) 50 MCG/ACT nasal spray Place 2 sprays into both nostrils as needed.    Marland Kitchen FREESTYLE LITE test strip USE AS INSTRUCTED TO CHECK BLOOD SUGAR ONCE A DAY 100 each 1  . hydrALAZINE (APRESOLINE) 50 MG tablet Take 1 tablet (50 mg total) by mouth daily.  90 tablet 1  . Lancets (FREESTYLE) lancets USE TO CHECK BLOOD SUGAR ONCE A DAY 100 each 1  . meloxicam (MOBIC) 7.5 MG tablet Take 1 tablet (7.5 mg total) by mouth daily. 14 tablet 0  . metoprolol succinate (TOPROL-XL) 50 MG 24 hr tablet Take 1 and 1/2 tablet of 50 mg by mouth daily 135 tablet 3  . Olmesartan-Amlodipine-HCTZ (TRIBENZOR) 40-10-25 MG TABS Take 1 tablet by mouth daily. 7 tablet 0  . omeprazole (PRILOSEC) 20 MG capsule TAKE 2 CAPSULES EVERY MORNING 180 capsule 1  . OVER THE COUNTER MEDICATION DR SHULAR'S INTESTINAL.  Take 1 tablet daily.    Marland Kitchen OVER THE COUNTER MEDICATION VITAMIN D3 AND PROBIOTIC.  Take 1 capsule daily.    Marland Kitchen OVER THE COUNTER MEDICATION MAXIMIZE LIVING.  MEN'S VITAMIN.  Take 1 tablet daily    . OVER THE COUNTER MEDICATION MAXIMIZE LIVING.  Maxfit.  Take 1 tablet daily.    Marland Kitchen OVER THE COUNTER MEDICATION OPTIMAL LIVING fish oil.  Take 2 capsules daily.    . potassium chloride SA (K-DUR,KLOR-CON) 20 MEQ tablet Take 1 tablet (20 mEq total) by mouth daily. 90 tablet 1  . Pyridoxine HCl (B-6 PO) Take 1 tablet by mouth daily.    . sildenafil (VIAGRA) 100 MG tablet Take 1/2 to 1 tablet daily as needed 15 tablet 0  . zolpidem (AMBIEN) 10 MG tablet Take 1 tablet (10 mg total) by mouth at bedtime as needed. 90 tablet 0  . [DISCONTINUED] potassium chloride (K-DUR) 10 MEQ tablet Take 1 tablet (10 mEq total) by mouth daily. 30 tablet 1   Current Facility-Administered Medications on File Prior to Visit  Medication Dose Route Frequency Provider Last Rate Last Dose  . triamcinolone acetonide (KENALOG) 10 MG/ML injection 10 mg  10 mg Other Once Richard Sikora, DPM        BP 134/75 mmHg  Pulse 83  Temp(Src) 98.4 F (36.9 C) (Oral)  Resp 18  Ht 6' (1.829 m)  Wt 337 lb (152.862 kg)  BMI 45.70 kg/m2  SpO2 99%       Objective:   Physical Exam  Constitutional: He is oriented to person, place, and time. He appears well-developed and well-nourished. No distress.  HENT:  Head:  Normocephalic and atraumatic.  Cardiovascular: Normal rate and regular rhythm.   No murmur heard. Pulmonary/Chest: Effort normal and breath sounds normal. No respiratory distress. He has no wheezes. He has no rales.  Musculoskeletal: He exhibits no edema.  Neurological: He is alert and oriented to person, place, and time.  Skin: Skin is warm and dry.  Psychiatric: He has a normal mood and affect. His behavior is normal. Thought content normal.          Assessment & Plan:  Low back pain-  Start meloxicam once daily, flexeril hs prn.  Pt has both meds at home.   DM2-  Obtain a1c  Hypokalemia- obtain bmet, resume Kdur

## 2015-01-27 ENCOUNTER — Telehealth: Payer: Self-pay | Admitting: Family

## 2015-01-27 NOTE — Telephone Encounter (Signed)
See my chart message

## 2015-02-21 ENCOUNTER — Encounter: Payer: Self-pay | Admitting: Family

## 2015-02-21 ENCOUNTER — Other Ambulatory Visit: Payer: Self-pay | Admitting: Family

## 2015-02-26 MED ORDER — ASPIRIN 81 MG PO TABS: 81.0000 mg | ORAL_TABLET | Freq: Every day | ORAL | Status: DC

## 2015-03-16 ENCOUNTER — Other Ambulatory Visit: Payer: Self-pay | Admitting: Family

## 2015-03-21 ENCOUNTER — Encounter: Payer: Self-pay | Admitting: Physical Medicine & Rehabilitation

## 2015-03-26 ENCOUNTER — Ambulatory Visit (INDEPENDENT_AMBULATORY_CARE_PROVIDER_SITE_OTHER): Admitting: Family

## 2015-03-26 ENCOUNTER — Encounter: Payer: Self-pay | Admitting: Family

## 2015-03-26 VITALS — BP 134/78 | HR 87 | Temp 98.1°F | Resp 16 | Ht 72.0 in | Wt 346.2 lb

## 2015-03-26 DIAGNOSIS — E039 Hypothyroidism, unspecified: Secondary | ICD-10-CM | POA: Diagnosis not present

## 2015-03-26 DIAGNOSIS — E1121 Type 2 diabetes mellitus with diabetic nephropathy: Secondary | ICD-10-CM

## 2015-03-26 DIAGNOSIS — R635 Abnormal weight gain: Secondary | ICD-10-CM | POA: Diagnosis not present

## 2015-03-26 DIAGNOSIS — F329 Major depressive disorder, single episode, unspecified: Secondary | ICD-10-CM

## 2015-03-26 DIAGNOSIS — E785 Hyperlipidemia, unspecified: Secondary | ICD-10-CM | POA: Diagnosis not present

## 2015-03-26 DIAGNOSIS — F32A Depression, unspecified: Secondary | ICD-10-CM

## 2015-03-26 DIAGNOSIS — E119 Type 2 diabetes mellitus without complications: Secondary | ICD-10-CM

## 2015-03-26 LAB — BASIC METABOLIC PANEL
BUN: 9 mg/dL (ref 6–23)
CALCIUM: 9.5 mg/dL (ref 8.4–10.5)
CO2: 27 mEq/L (ref 19–32)
Chloride: 102 mEq/L (ref 96–112)
Creatinine, Ser: 0.92 mg/dL (ref 0.40–1.50)
GFR: 112.58 mL/min (ref >60.00)
GLUCOSE: 130 mg/dL — AB (ref 70–99)
POTASSIUM: 3.4 meq/L — AB (ref 3.5–5.1)
Sodium: 138 mEq/L (ref 135–145)

## 2015-03-26 LAB — LIPID PANEL
Cholesterol: 127 mg/dL (ref 0–200)
HDL: 44.4 mg/dL (ref >39.00)
LDL Cholesterol: 44 mg/dL (ref 0–99)
NONHDL: 82.63
Total CHOL/HDL Ratio: 3
Triglycerides: 192 mg/dL — ABNORMAL HIGH (ref 0.0–149.0)
VLDL: 38.4 mg/dL (ref 0.0–40.0)

## 2015-03-26 MED ORDER — MELOXICAM 7.5 MG PO TABS: 7.5000 mg | ORAL_TABLET | Freq: Every day | ORAL | Status: DC

## 2015-03-26 NOTE — Assessment & Plan Note (Signed)
Deteriorated.  Obtain TSH, refer to nutrition.

## 2015-03-26 NOTE — Progress Notes (Signed)
Pre visit review using our clinic review tool, if applicable. No additional management support is needed unless otherwise documented below in the visit note. 

## 2015-03-26 NOTE — Assessment & Plan Note (Signed)
Deteriorated off of wellbutrin. Advised pt to resume wellbutrin and arrange follow up with psych at [facility].

## 2015-03-26 NOTE — Assessment & Plan Note (Signed)
Suspect sugars have been running higher due to ESI.  Advised pt to monitor sugars at home, call if elevated.  Too soon to recheck A1C .

## 2015-03-26 NOTE — Patient Instructions (Addendum)
Please restart Wellbutrin.   Complete lab work prior to leaving.

## 2015-03-26 NOTE — Progress Notes (Signed)
Subjective:    Patient ID: Charles Nash, male    DOB: 1965/07/10, 49 y.o.   MRN: [id number]  HPI  Charles Nash is a 49 yr old male who presents today for follow up.  1) DM2- Not checking sugars recently. Maintained on januvia.   Lab Results  Component Value Date   HGBA1C 7.0* 01/26/2015   HGBA1C 6.6* 09/15/2014   HGBA1C 7.2* 06/13/2014   Lab Results  Component Value Date   MICROALBUR 2.3* 01/26/2015   LDLCALC 107* 06/13/2014   CREATININE 0.98 01/26/2015   2) Back Pain- reports 10 pound weight gain since he had ESI.  Back pain is better.  Craving deserts or lemonade.   3) Depression- has not followed up with psych since [place] moved.  Stopped wellbutrin 3 months ago. Reports that he gets aggravated easily- this is unchanged.  Has trouble staying focused.  Felt like his concentration was better when he was on wellbutrin.  Finds himself procrastinating. Not sleeping well despite use of ambien.  Review of Systems    see HPI  Past Medical History  Diagnosis Date  . Hyperlipidemia   . Diabetes mellitus   . Hypertension   . OSA (obstructive sleep apnea)   . Arthritis   . Allergy   . Personal history of colonic polyps   . GERD (gastroesophageal reflux disease)   . Fatty liver 08/06/2011  . Bilateral varicoceles   . Plantar fasciitis   . ADHD (attention deficit hyperactivity disorder)   . PTSD (post-traumatic stress disorder)   . Genital herpes 10/25/2014    Social History   Social History  . Marital Status: Married    Spouse Name: N/A  . Number of Children: 2  . Years of Education: N/A   Occupational History  . FOOTBALL COACH    Social History Main Topics  . Smoking status: Never Smoker   . Smokeless tobacco: Never Used  . Alcohol Use: No     Comment: occasional wine  . Drug Use: No  . Sexual Activity: Not on file   Other Topics Concern  . Not on file   Social History Narrative   Regular exercise:  No   Caffeine use:  2--32oz cups daily           Past Surgical History  Procedure Laterality Date  . Knee surgery  08/04/08    Right knee-- medial meniscus repair, attenuation anterior cruciate [name] MD [facility])   . Ankle surgery    . Foot surgery    . Shoulder surgery    . Varicocele excision    . Plantar fascia release    . Nm myoview ltd  2010  . Doppler echocardiography  2010    Family History  Problem Relation Age of Onset  . Diabetes Mother   . Hypertension Mother   . Arthritis Mother   . Cancer Mother     uterine and breast cancer  . Hyperlipidemia Father   . Arthritis Father   . Cancer Father     thyroid, prostate cancer  . Other Father     mass in stomach  . Hyperlipidemia Maternal Grandmother   . Hypertension Maternal Grandmother   . Hyperlipidemia Maternal Grandfather   . Hypertension Maternal Grandfather   . Hyperlipidemia Paternal Grandmother   . Hypertension Paternal Grandmother   . Hyperlipidemia Paternal Grandfather   . Hypertension Paternal Grandfather   . Heart attack Paternal Grandfather   . Sudden death Neg Hx   .  Colon cancer Neg Hx   . Esophageal cancer Neg Hx   . Stomach cancer Neg Hx   . Rectal cancer Neg Hx     Allergies  Allergen Reactions  . Oxycodone-Acetaminophen Hives and Itching    Current Outpatient Prescriptions on File Prior to Visit  Medication Sig Dispense Refill  . aspirin 81 MG tablet Take 1 tablet (81 mg total) by mouth daily. 90 tablet 1  . atorvastatin (LIPITOR) 10 MG tablet TAKE 1 TABLET DAILY 90 tablet 1  . Blood Glucose Monitoring Suppl (FREESTYLE LITE) DEVI Use to check blood sugar once a day.  E11.9 1 each 0  . cetirizine (ZYRTEC) 10 MG tablet Take 1 tablet (10 mg total) by mouth daily. 30 tablet 11  . fluticasone (FLONASE) 50 MCG/ACT nasal spray Place 2 sprays into both nostrils as needed.    . hydrALAZINE (APRESOLINE) 50 MG tablet Take 1 tablet (50 mg total) by mouth daily. 90 tablet 1  . meloxicam (MOBIC) 7.5 MG tablet Take 1 tablet  (7.5 mg total) by mouth daily. 14 tablet 0  . metoprolol succinate (TOPROL-XL) 50 MG 24 hr tablet Take 1 and 1/2 tablet of 50 mg by mouth daily 135 tablet 3  . Olmesartan-Amlodipine-HCTZ (TRIBENZOR) 40-10-25 MG TABS Take 1 tablet by mouth daily. 7 tablet 0  . omeprazole (PRILOSEC) 20 MG capsule TAKE 2 CAPSULES EVERY MORNING 180 capsule 1  . potassium chloride SA (K-DUR,KLOR-CON) 20 MEQ tablet Take 1 tablet (20 mEq total) by mouth daily. 90 tablet 1  . sitaGLIPtin (JANUVIA) 100 MG tablet Take 100 mg by mouth daily.    Marland Kitchen zolpidem (AMBIEN) 10 MG tablet Take 1 tablet (10 mg total) by mouth at bedtime as needed. 90 tablet 0  . buPROPion (WELLBUTRIN XL) 150 MG 24 hr tablet Take 1 tablet (150 mg total) by mouth daily. (Patient not taking: Reported on 03/26/2015) 90 tablet 1  . FREESTYLE LITE test strip USE AS INSTRUCTED TO CHECK BLOOD SUGAR ONCE A DAY (Patient not taking: Reported on 03/26/2015) 100 each 1  . Lancets (FREESTYLE) lancets USE TO CHECK BLOOD SUGAR ONCE A DAY (Patient not taking: Reported on 03/26/2015) 100 each 1  . OVER THE COUNTER MEDICATION DR SHULAR'S INTESTINAL.  Take 1 tablet daily.    Marland Kitchen OVER THE COUNTER MEDICATION VITAMIN D3 AND PROBIOTIC.  Take 1 capsule daily.    Marland Kitchen OVER THE COUNTER MEDICATION MAXIMIZE LIVING.  MEN'S VITAMIN.  Take 1 tablet daily    . OVER THE COUNTER MEDICATION MAXIMIZE LIVING.  Maxfit.  Take 1 tablet daily.    Marland Kitchen OVER THE COUNTER MEDICATION OPTIMAL LIVING fish oil.  Take 2 capsules daily.    . Pyridoxine HCl (B-6 PO) Take 1 tablet by mouth daily.    . sildenafil (VIAGRA) 100 MG tablet Take 1/2 to 1 tablet daily as needed 15 tablet 0  . [DISCONTINUED] potassium chloride (K-DUR) 10 MEQ tablet Take 1 tablet (10 mEq total) by mouth daily. 30 tablet 1   Current Facility-Administered Medications on File Prior to Visit  Medication Dose Route Frequency Provider Last Rate Last Dose  . triamcinolone acetonide (KENALOG) 10 MG/ML injection 10 mg  10 mg Other Once Harriet Masson, DPM        BP 134/78 mmHg  Pulse 87  Temp(Src) 98.1 F (36.7 C) (Oral)  Resp 16  Ht 6' (1.829 m)  Wt 346 lb 3.2 oz (157.035 kg)  BMI 46.94 kg/m2  SpO2 99%    Objective:   Physical  Exam  Constitutional: He is oriented to person, place, and time. He appears well-developed and well-nourished. No distress.  HENT:  Head: Normocephalic and atraumatic.  Cardiovascular: Normal rate and regular rhythm.   No murmur heard. Pulmonary/Chest: Effort normal and breath sounds normal. No respiratory distress. He has no wheezes. He has no rales.  Musculoskeletal: He exhibits no edema.  Neurological: He is alert and oriented to person, place, and time.  Skin: Skin is warm and dry.  Psychiatric: He has a normal mood and affect. His behavior is normal. Thought content normal.          Assessment & Plan:

## 2015-03-27 ENCOUNTER — Encounter: Payer: Self-pay | Admitting: Family

## 2015-03-27 DIAGNOSIS — E876 Hypokalemia: Secondary | ICD-10-CM

## 2015-03-27 LAB — TSH: TSH: 0.58 u[IU]/mL (ref 0.35–4.50)

## 2015-03-28 ENCOUNTER — Ambulatory Visit: Admitting: Family

## 2015-03-31 ENCOUNTER — Other Ambulatory Visit: Payer: Self-pay | Admitting: Family

## 2015-04-06 ENCOUNTER — Other Ambulatory Visit (INDEPENDENT_AMBULATORY_CARE_PROVIDER_SITE_OTHER)

## 2015-04-06 DIAGNOSIS — E876 Hypokalemia: Secondary | ICD-10-CM | POA: Diagnosis not present

## 2015-04-06 LAB — BASIC METABOLIC PANEL
BUN: 10 mg/dL (ref 7–25)
CHLORIDE: 102 mmol/L (ref 98–110)
CO2: 26 mmol/L (ref 20–31)
Calcium: 9.8 mg/dL (ref 8.6–10.3)
Creat: 0.98 mg/dL (ref 0.60–1.35)
GLUCOSE: 154 mg/dL — AB (ref 65–99)
POTASSIUM: 3.5 mmol/L (ref 3.5–5.3)
SODIUM: 138 mmol/L (ref 135–146)

## 2015-04-08 ENCOUNTER — Other Ambulatory Visit: Payer: Self-pay | Admitting: Family

## 2015-04-09 ENCOUNTER — Telehealth: Payer: Self-pay | Admitting: Family

## 2015-04-09 NOTE — Telephone Encounter (Signed)
Please contact pt re: niaspan refill request. Not on our med list. Is he getting from [place]? Ok to refill if he has been taking regularly.

## 2015-04-10 NOTE — Telephone Encounter (Signed)
Niacin originally ordered by you on 10/11/2014 for #90 tablets and 1 refill to [facility], d/c by Sheral Apley, RN on 11/07/2014.

## 2015-04-12 ENCOUNTER — Telehealth: Payer: Self-pay

## 2015-04-12 MED ORDER — NIACIN ER (ANTIHYPERLIPIDEMIC) 500 MG PO TBCR: 500.0000 mg | EXTENDED_RELEASE_TABLET | Freq: Every day | ORAL | Status: DC

## 2015-04-12 NOTE — Telephone Encounter (Signed)
Ok to refill Niaspan, #90

## 2015-04-12 NOTE — Telephone Encounter (Signed)
Patient called to check the status of his forms from the [facility], I talked with Medical records and confirmed that we have received the forms. I told Charles Nash the the forms will not be completed until the provider returns next week. He stated he could not wait that long because these forms where about his driver's license and he needed the forms completed by tomorrow I told him that was not possible and he said he would come to the office and pick up a copy to see if he could work something else out. But he still wants [name] to complete the form. I left a copy at the front desk

## 2015-04-12 NOTE — Telephone Encounter (Signed)
Medication filled to pharmacy as requested.   

## 2015-04-12 NOTE — Telephone Encounter (Signed)
Forms completed as much as possible and forwarded to [facility]. JG//CMA

## 2015-04-13 ENCOUNTER — Other Ambulatory Visit: Payer: Self-pay | Admitting: Family

## 2015-04-17 ENCOUNTER — Encounter: Payer: Self-pay | Admitting: Family

## 2015-04-17 ENCOUNTER — Telehealth: Payer: Self-pay | Admitting: Family

## 2015-04-17 MED ORDER — OLMESARTAN-AMLODIPINE-HCTZ 40-10-25 MG PO TABS: 1.0000 | ORAL_TABLET | Freq: Every day | ORAL | Status: DC

## 2015-04-17 MED FILL — TRIBENZOR 40-10-25 MG TAB: 40-10-25 | 30 days supply | Qty: 30 | Fill #0

## 2015-04-17 NOTE — Telephone Encounter (Signed)
30 day supply sent

## 2015-04-17 NOTE — Telephone Encounter (Signed)
Caller name: Self   Can be reached: [phone] Pharmacy: [facility], [place] [address] [phone] ([phone] (Fax)         Reason for call: Request refill on Olmesartan-Amlodipine-HCTZ [facility]) 40-10-25 MG TABS [id number]

## 2015-04-18 ENCOUNTER — Other Ambulatory Visit: Payer: Self-pay | Admitting: *Deleted

## 2015-04-18 MED ORDER — ZOLPIDEM TARTRATE 10 MG PO TABS: 10.0000 mg | ORAL_TABLET | Freq: Every evening | ORAL | Status: DC | PRN

## 2015-04-18 NOTE — Telephone Encounter (Signed)
Pt request 90 day supply of zolpidem to [facility]. Dr Charlett Blake has been authorizing on [facility] behalf. Last UDS 01/2015 and next due 07/2015.  Rx printed and forwarded to DR Charlett Blake for signature.

## 2015-04-20 NOTE — Telephone Encounter (Signed)
Faxed hardcopy for Zolpidem to [facility].

## 2015-04-23 ENCOUNTER — Encounter

## 2015-04-23 ENCOUNTER — Ambulatory Visit: Admitting: Physical Medicine & Rehabilitation

## 2015-04-24 ENCOUNTER — Telehealth: Payer: Self-pay | Admitting: Family

## 2015-04-24 NOTE — Telephone Encounter (Signed)
I am working on his paperwork for the [facility]. They are requesting a CPAP compliance report.  Could we please request this from his CPAP company?

## 2015-04-24 NOTE — Telephone Encounter (Signed)
Left detailed message on pt's cell# and to call or send mychart response to below question.

## 2015-04-26 ENCOUNTER — Other Ambulatory Visit: Payer: Self-pay | Admitting: Family

## 2015-04-26 ENCOUNTER — Encounter: Payer: Self-pay | Admitting: Skilled Nursing Facility1

## 2015-04-26 ENCOUNTER — Encounter: Payer: Self-pay | Admitting: Family

## 2015-04-26 ENCOUNTER — Encounter: Attending: Family | Admitting: Skilled Nursing Facility1

## 2015-04-26 VITALS — Ht 73.0 in | Wt 345.0 lb

## 2015-04-26 DIAGNOSIS — Z713 Dietary counseling and surveillance: Secondary | ICD-10-CM | POA: Insufficient documentation

## 2015-04-26 DIAGNOSIS — E119 Type 2 diabetes mellitus without complications: Secondary | ICD-10-CM

## 2015-04-26 NOTE — Progress Notes (Signed)
Diabetes Self-Management Education  Visit Type: First/Initial  Appt. Start Time: 11:00Appt. End Time: 11:15  04/26/2015  Charles Nash, identified by name and date of birth, is a 50 y.o. male with a diagnosis of Diabetes: Type 2.   ASSESSMENT  Height 6\' 1"  (1.854 m), weight 345 lb (156.491 kg). Body mass index is 45.53 kg/(m^2).   Pt states he would like diabetes education and weight loss information. Pt states at one point he got down to 200 pounds and was off of all of his medications but then his father passed away and coped with food. Pt states he has severe foot pain-tingling, shooting pain, burning-Dietitian advised the pt to talk to his doctor about this pain. Pt states he will eat anything with chocolate and eats it all day long in addition to always adding sugar to a lot of his foods and drinking sugar loaded beverages. Pt had trouble keeping his eyes open they were so swollen looking and had labored breathing while seated, standing, and walking.       Diabetes Self-Management Education - 04/26/15 1058    Visit Information   Visit Type First/Initial   Initial Visit   Diabetes Type Type 2   Are you currently following a meal plan? No   Are you taking your medications as prescribed? Yes   Date Diagnosed 2005   Health Coping   How would you rate your overall health? Poor   Psychosocial Assessment   Patient Belief/Attitude about Diabetes Denial   Self-management support Church;Family;Friends   Other persons present Spouse/SO   Patient Concerns Nutrition/Meal planning;Weight Control   Special Needs None   Preferred Learning Style Auditory   Learning Readiness Contemplating   How often do you need to have someone help you when you read instructions, pamphlets, or other written materials from your doctor or pharmacy? 1 - Never   Complications   Last HgB A1C per patient/outside source 7 %   How often do you check your blood sugar? 1-2 times/day   Fasting Blood glucose  range (mg/dL) 130-179   Number of hypoglycemic episodes per month 0   Number of hyperglycemic episodes per week 0   Have you had a dilated eye exam in the past 12 months? Yes   Have you had a dental exam in the past 12 months? Yes   Are you checking your feet? Yes   How many days per week are you checking your feet? 7   Dietary Intake   Breakfast eggs, grits, toast, bacon-----cereal-----fast food   Snack (morning) cheesecake------chocolate   Lunch chef salad   Snack (afternoon) pecan cakes (the whole box)   Dinner fried fish, vegetables, carbohydrates-----more often chinese food   Snack (evening) ice cream-whole hagan daaz   Beverage(s) lemonade, water, arnold palmer, soda, juice   Exercise   How many days per week to you exercise? 0   How many minutes per day do you exercise? 0   Total minutes per week of exercise 0   Patient Education   Previous Diabetes Education --  back in 2006-2007   Disease state  Definition of diabetes, type 1 and 2, and the diagnosis of diabetes;Factors that contribute to the development of diabetes   Nutrition management  Role of diet in the treatment of diabetes and the relationship between the three main macronutrients and blood glucose level;Food label reading, portion sizes and measuring food.;Carbohydrate counting;Information on hints to eating out and maintain blood glucose control.   Physical activity and  exercise  Role of exercise on diabetes management, blood pressure control and cardiac health.;Helped patient identify appropriate exercises in relation to his/her diabetes, diabetes complications and other health issue.   Monitoring Interpreting lab values - A1C, lipid, urine microalbumina.;Daily foot exams;Yearly dilated eye exam   Chronic complications Relationship between chronic complications and blood glucose control;Dental care;Retinopathy and reason for yearly dilated eye exams;Lipid levels, blood glucose control and heart disease;Assessed and  discussed foot care and prevention of foot problems   Psychosocial adjustment Identified and addressed patients feelings and concerns about diabetes   Individualized Goals (developed by patient)   Nutrition Follow meal plan discussed;General guidelines for healthy choices and portions discussed   Physical Activity Exercise 1-2 times per week   Reducing Risk do foot checks daily;increase portions of healthy fats;increase portions of nuts and seeds   Outcomes   Expected Outcomes Demonstrated interest in learning. Expect positive outcomes   Future DMSE 3-4 months   Program Status Completed      Individualized Plan for Diabetes Self-Management Training:   Learning Objective:  Patient will have a greater understanding of diabetes self-management. Patient education plan is to attend individual and/or group sessions per assessed needs and concerns.   Plan:   There are no Patient Instructions on file for this visit.  Expected Outcomes:  Demonstrated interest in learning. Expect positive outcomes  Education material provided: Living Well with Diabetes, Meal plan card, My Plate and Snack sheet  If problems or questions, patient to contact team via:  Phone  Future DSME appointment: 3-4 months

## 2015-04-27 NOTE — Telephone Encounter (Signed)
See 04/24/15 phone note.

## 2015-04-27 NOTE — Telephone Encounter (Signed)
Received report and placed it in PCP red folder for review.

## 2015-04-27 NOTE — Telephone Encounter (Signed)
Pt sent mychart message that CPAP came from [place] in [place] [phone]).  Called and was transferred to [facility] at ext [phone] and told that he will look into this and call me back this afternoon. Awaitiing call back.

## 2015-04-27 NOTE — Telephone Encounter (Signed)
Spoke with Elta Guadeloupe (Respiratory Therapist) and was told that download from 09/2014 showed 84% compliance, AHI 1.3 and sleep time averaging 4 hr 44min a night. Requested copy of compliance report and he transferred me to NP, Alice Reichert who states he will fax [place] the note. Awaiting fax to the nurses station.

## 2015-05-02 ENCOUNTER — Encounter: Payer: Self-pay | Admitting: Physical Medicine & Rehabilitation

## 2015-05-02 ENCOUNTER — Ambulatory Visit (INDEPENDENT_AMBULATORY_CARE_PROVIDER_SITE_OTHER): Admitting: Family

## 2015-05-02 ENCOUNTER — Encounter: Payer: Self-pay | Admitting: Family

## 2015-05-02 VITALS — BP 131/78 | HR 84 | Temp 98.5°F | Resp 18 | Ht 72.0 in | Wt 346.6 lb

## 2015-05-02 DIAGNOSIS — I10 Essential (primary) hypertension: Secondary | ICD-10-CM | POA: Diagnosis not present

## 2015-05-02 DIAGNOSIS — F329 Major depressive disorder, single episode, unspecified: Secondary | ICD-10-CM | POA: Diagnosis not present

## 2015-05-02 DIAGNOSIS — F32A Depression, unspecified: Secondary | ICD-10-CM

## 2015-05-02 DIAGNOSIS — E119 Type 2 diabetes mellitus without complications: Secondary | ICD-10-CM | POA: Diagnosis not present

## 2015-05-02 DIAGNOSIS — E118 Type 2 diabetes mellitus with unspecified complications: Secondary | ICD-10-CM

## 2015-05-02 DIAGNOSIS — G4733 Obstructive sleep apnea (adult) (pediatric): Secondary | ICD-10-CM

## 2015-05-02 LAB — HEMOGLOBIN A1C: HEMOGLOBIN A1C: 7.3 % — AB (ref 4.6–6.5)

## 2015-05-02 MED ORDER — BUPROPION HCL 75 MG PO TABS: ORAL_TABLET | ORAL | Status: DC

## 2015-05-02 NOTE — Progress Notes (Signed)
Subjective:    Patient ID: Charles Nash, male    DOB: 1965-09-02, 50 y.o.   MRN: [id number]  HPI  Charles Nash is a 50 yr old male who presents today for follow up.  1) DM2- Patient is currently maintained on januvia.  Reports that his sugars have been higher than he would like.  120-150's.   Lab Results  Component Value Date   HGBA1C 7.0* 01/26/2015   HGBA1C 6.6* 09/15/2014   HGBA1C 7.2* 06/13/2014   Lab Results  Component Value Date   MICROALBUR 2.3* 01/26/2015   LDLCALC 44 03/26/2015   CREATININE 0.98 04/06/2015   2) Depression-  Pt reports that he was unable to tolerate wellbutrin 150mg  daily and is instead taking 75mg  daily.  Reports that he felt light headed on this dose.  He reports good mood on wellbutrin 75.   3) OSA-  Patient needs documentation for the DMV.  Continues CPAP.  4) HTN- patient is maintained on hydralazine, toprol xl, tribenzor. BP Readings from Last 3 Encounters:  05/02/15 131/78  03/26/15 134/78  01/26/15 134/75     Review of Systems See HPI  Past Medical History  Diagnosis Date  . Hyperlipidemia   . Diabetes mellitus   . Hypertension   . OSA (obstructive sleep apnea)   . Arthritis   . Allergy   . Personal history of colonic polyps   . GERD (gastroesophageal reflux disease)   . Fatty liver 08/06/2011  . Bilateral varicoceles   . Plantar fasciitis   . ADHD (attention deficit hyperactivity disorder)   . PTSD (post-traumatic stress disorder)   . Genital herpes 10/25/2014    Social History   Social History  . Marital Status: Married    Spouse Name: N/A  . Number of Children: 2  . Years of Education: N/A   Occupational History  . FOOTBALL COACH    Social History Main Topics  . Smoking status: Never Smoker   . Smokeless tobacco: Never Used  . Alcohol Use: No     Comment: occasional wine  . Drug Use: No  . Sexual Activity: Not on file   Other Topics Concern  . Not on file   Social History Narrative   Regular  exercise:  No   Caffeine use:  2--32oz cups daily          Past Surgical History  Procedure Laterality Date  . Knee surgery  08/04/08    Right knee-- medial meniscus repair, attenuation anterior cruciate [name] MD [facility])   . Ankle surgery    . Foot surgery    . Shoulder surgery    . Varicocele excision    . Plantar fascia release    . Nm myoview ltd  2010  . Doppler echocardiography  2010    Family History  Problem Relation Age of Onset  . Diabetes Mother   . Hypertension Mother   . Arthritis Mother   . Cancer Mother     uterine and breast cancer  . Hyperlipidemia Father   . Arthritis Father   . Cancer Father     thyroid, prostate cancer  . Other Father     mass in stomach  . Hyperlipidemia Maternal Grandmother   . Hypertension Maternal Grandmother   . Hyperlipidemia Maternal Grandfather   . Hypertension Maternal Grandfather   . Hyperlipidemia Paternal Grandmother   . Hypertension Paternal Grandmother   . Hyperlipidemia Paternal Grandfather   . Hypertension Paternal Grandfather   . Heart  attack Paternal Grandfather   . Sudden death Neg Hx   . Colon cancer Neg Hx   . Esophageal cancer Neg Hx   . Stomach cancer Neg Hx   . Rectal cancer Neg Hx     Allergies  Allergen Reactions  . Oxycodone-Acetaminophen Hives and Itching    Current Outpatient Prescriptions on File Prior to Visit  Medication Sig Dispense Refill  . aspirin 81 MG tablet Take 1 tablet (81 mg total) by mouth daily. 90 tablet 1  . atorvastatin (LIPITOR) 10 MG tablet TAKE 1 TABLET DAILY 90 tablet 1  . Blood Glucose Monitoring Suppl (FREESTYLE LITE) DEVI Use to check blood sugar once a day.  E11.9 1 each 0  . cetirizine (ZYRTEC) 10 MG tablet Take 1 tablet (10 mg total) by mouth daily. 30 tablet 11  . fluticasone (FLONASE) 50 MCG/ACT nasal spray Place 2 sprays into both nostrils as needed.    Marland Kitchen FREESTYLE LITE test strip USE AS INSTRUCTED TO CHECK BLOOD SUGAR ONCE DAILY 100 each 1    . hydrALAZINE (APRESOLINE) 50 MG tablet Take 1 tablet (50 mg total) by mouth daily. 90 tablet 1  . Lancets (FREESTYLE) lancets USE TO CHECK BLOOD SUGAR ONCE DAILY 100 each 1  . meloxicam (MOBIC) 7.5 MG tablet Take 1 tablet (7.5 mg total) by mouth daily. 14 tablet 0  . metoprolol succinate (TOPROL-XL) 50 MG 24 hr tablet Take 1 and 1/2 tablet of 50 mg by mouth daily 135 tablet 3  . niacin (NIASPAN) 500 MG CR tablet Take 1 tablet (500 mg total) by mouth at bedtime. 90 tablet 1  . Olmesartan-Amlodipine-HCTZ (TRIBENZOR) 40-10-25 MG TABS Take 1 tablet by mouth daily. 30 tablet 0  . omeprazole (PRILOSEC) 20 MG capsule TAKE 2 CAPSULES EVERY MORNING 180 capsule 1  . OVER THE COUNTER MEDICATION DR SHULAR'S INTESTINAL.  Take 1 tablet daily.    Marland Kitchen OVER THE COUNTER MEDICATION VITAMIN D3 AND PROBIOTIC.  Take 1 capsule daily.    Marland Kitchen OVER THE COUNTER MEDICATION MAXIMIZE LIVING.  MEN'S VITAMIN.  Take 1 tablet daily    . OVER THE COUNTER MEDICATION MAXIMIZE LIVING.  Maxfit.  Take 1 tablet daily.    Marland Kitchen OVER THE COUNTER MEDICATION OPTIMAL LIVING fish oil.  Take 2 capsules daily.    . potassium chloride SA (K-DUR,KLOR-CON) 20 MEQ tablet Take 1 tablet (20 mEq total) by mouth daily. 90 tablet 1  . Pyridoxine HCl (B-6 PO) Take 1 tablet by mouth daily.    . sildenafil (VIAGRA) 100 MG tablet Take 1/2 to 1 tablet daily as needed 15 tablet 0  . sitaGLIPtin (JANUVIA) 100 MG tablet Take 100 mg by mouth daily.    Marland Kitchen VIAGRA 100 MG tablet TAKE ONE-HALF (1/2) TO 1 TABLET DAILY AS NEEDED 15 tablet 3  . [DISCONTINUED] potassium chloride (K-DUR) 10 MEQ tablet Take 1 tablet (10 mEq total) by mouth daily. 30 tablet 1   Current Facility-Administered Medications on File Prior to Visit  Medication Dose Route Frequency Provider Last Rate Last Dose  . triamcinolone acetonide (KENALOG) 10 MG/ML injection 10 mg  10 mg Other Once Richard Sikora, DPM        BP 131/78 mmHg  Pulse 84  Temp(Src) 98.5 F (36.9 C) (Oral)  Resp 18  Ht 6'  (1.829 m)  Wt 346 lb 9.6 oz (157.217 kg)  BMI 47.00 kg/m2  SpO2 100%       Objective:   Physical Exam  Constitutional: He is oriented to person,  place, and time. He appears well-developed and well-nourished. No distress.  HENT:  Head: Normocephalic and atraumatic.  Cardiovascular: Normal rate and regular rhythm.   No murmur heard. Pulmonary/Chest: Effort normal and breath sounds normal. No respiratory distress. He has no wheezes. He has no rales.  Musculoskeletal: He exhibits no edema.  Lymphadenopathy:    He has no cervical adenopathy.  Neurological: He is alert and oriented to person, place, and time.  Skin: Skin is warm and dry.  Psychiatric: He has a normal mood and affect. His behavior is normal. Thought content normal.          Assessment & Plan:

## 2015-05-02 NOTE — Telephone Encounter (Signed)
Late entry: forms were faxed to [facility] and mailed to pt's home address. Copy sent for scanning.  Faxed letter from [facility] to [facility] on 05/02/15 regarding sleep study successfully. JG//CMA

## 2015-05-02 NOTE — Assessment & Plan Note (Signed)
BP stable on current meds. Continue same.  

## 2015-05-02 NOTE — Assessment & Plan Note (Signed)
Patient is currently maintained on januvia.  Discussed diet, exercise, weight loss.  Obtain A1C.

## 2015-05-02 NOTE — Patient Instructions (Signed)
Please complete lab work prior to leaving.   

## 2015-05-02 NOTE — Assessment & Plan Note (Signed)
Pt is taking 75mg  once daily in AM. This is not extended release. We discussed that patient can cut in half and take 1/2 tab twice daily.

## 2015-05-02 NOTE — Assessment & Plan Note (Signed)
Good compliance with CPAP.  Continue same 

## 2015-05-02 NOTE — Progress Notes (Signed)
Pre visit review using our clinic review tool, if applicable. No additional management support is needed unless otherwise documented below in the visit note. 

## 2015-05-03 ENCOUNTER — Telehealth: Payer: Self-pay | Admitting: Family

## 2015-05-03 MED ORDER — PIOGLITAZONE HCL 30 MG PO TABS
30.0000 mg | ORAL_TABLET | Freq: Every day | ORAL | Status: DC
Start: 2015-05-03 — End: 2015-06-26

## 2015-05-03 NOTE — Telephone Encounter (Signed)
Sugar above goal. Add actos 30mg  once daily.

## 2015-05-03 NOTE — Telephone Encounter (Signed)
Patient was notified and voices understanding.  

## 2015-05-04 ENCOUNTER — Telehealth: Payer: Self-pay | Admitting: *Deleted

## 2015-05-04 NOTE — Telephone Encounter (Signed)
Form filled

## 2015-05-04 NOTE — Telephone Encounter (Signed)
Faxed form to [facility] at [phone]. Awaiting determination.

## 2015-05-04 NOTE — Telephone Encounter (Signed)
Received fax from [place] that Actos is a non-preferred medication. Preferred alternatives are: Glimepiride, Glipizide, Glyburide and Metformin. Please see form for PA completion. Form forwarded to PCP for completion.

## 2015-05-07 NOTE — Telephone Encounter (Signed)
Received Approval for Actos, generic, valid through 04/13/2098, sent to scan/SLS 01/23

## 2015-05-15 ENCOUNTER — Encounter: Attending: Physical Medicine & Rehabilitation

## 2015-05-15 ENCOUNTER — Ambulatory Visit (HOSPITAL_BASED_OUTPATIENT_CLINIC_OR_DEPARTMENT_OTHER): Admitting: Physical Medicine & Rehabilitation

## 2015-05-15 ENCOUNTER — Encounter: Payer: Self-pay | Admitting: Physical Medicine & Rehabilitation

## 2015-05-15 VITALS — BP 145/65 | HR 77

## 2015-05-15 DIAGNOSIS — M222X1 Patellofemoral disorders, right knee: Secondary | ICD-10-CM | POA: Insufficient documentation

## 2015-05-15 DIAGNOSIS — E119 Type 2 diabetes mellitus without complications: Secondary | ICD-10-CM | POA: Insufficient documentation

## 2015-05-15 DIAGNOSIS — M222X9 Patellofemoral disorders, unspecified knee: Secondary | ICD-10-CM

## 2015-05-15 DIAGNOSIS — M549 Dorsalgia, unspecified: Secondary | ICD-10-CM | POA: Diagnosis not present

## 2015-05-15 DIAGNOSIS — G8929 Other chronic pain: Secondary | ICD-10-CM | POA: Insufficient documentation

## 2015-05-15 DIAGNOSIS — K760 Fatty (change of) liver, not elsewhere classified: Secondary | ICD-10-CM | POA: Diagnosis not present

## 2015-05-15 DIAGNOSIS — Z79899 Other long term (current) drug therapy: Secondary | ICD-10-CM | POA: Diagnosis not present

## 2015-05-15 DIAGNOSIS — Z5181 Encounter for therapeutic drug level monitoring: Secondary | ICD-10-CM

## 2015-05-15 DIAGNOSIS — E785 Hyperlipidemia, unspecified: Secondary | ICD-10-CM | POA: Diagnosis not present

## 2015-05-15 DIAGNOSIS — F4310 Post-traumatic stress disorder, unspecified: Secondary | ICD-10-CM | POA: Insufficient documentation

## 2015-05-15 DIAGNOSIS — G4733 Obstructive sleep apnea (adult) (pediatric): Secondary | ICD-10-CM | POA: Insufficient documentation

## 2015-05-15 DIAGNOSIS — M47816 Spondylosis without myelopathy or radiculopathy, lumbar region: Secondary | ICD-10-CM

## 2015-05-15 DIAGNOSIS — K219 Gastro-esophageal reflux disease without esophagitis: Secondary | ICD-10-CM | POA: Insufficient documentation

## 2015-05-15 DIAGNOSIS — M545 Low back pain: Secondary | ICD-10-CM | POA: Diagnosis not present

## 2015-05-15 DIAGNOSIS — I10 Essential (primary) hypertension: Secondary | ICD-10-CM | POA: Insufficient documentation

## 2015-05-15 NOTE — Patient Instructions (Signed)

## 2015-05-15 NOTE — Progress Notes (Signed)
Subjective:    Patient ID: Charles New., male    DOB: 1965/07/16, 50 y.o.   MRN: [id number]  HPI 50 year old male with chief complaint of low back pain.  Patient states that his low back pain isFairly constant and worsens with inactivity. He recently bought a bicycle but has not started riding it yet. His overall goal is to lower his weight and get in better shape. 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  He had some knee problems and required arthroscopy on 2 occasions on the right knee. He does not note any discrete injury in the past on his back. He was seen in [facility] by Dr. Wilford Grist who performed an L4-5 epidural on him November 2016 when the patient was complaining of left lower extremity sciatic type discomfort. Currently he does not have any pain that radiates beyond the buttocks. His pain worsens with bending backwards as opposed to bending forward. He does not have any leg weakness or bowel or bladder complaints. He is a diabetic and states his control has not been very good lately. He gets some tingling in his feet but does not note any numbness. He does not note any foot or ankle weakness. He does have pain in back of his heels bilaterally. He has been seen by a podiatrist and offered lithotripsy however the out-of-pocket expense was $500 Currently not taking pain meds and has low tolerance for pain meds  Pain Inventory Average Pain 6 Pain Right Now 5 My pain is sharp, stabbing and aching  In the last 24 hours, has pain interfered with the following? General activity 10 Relation with others 5 Enjoyment of life 10 What TIME of day is your pain at its worst? daytime Sleep (in general) NA  Pain is worse with: inactivity Pain improves with: heat/ice Relief from Meds: no pain med  Mobility walk without assistance how many minutes can you walk? 5 ability to climb steps?  yes do you drive?  yes  Function disabled: date  disabled 11/2005 retired I need assistance with the following:  dressing and household duties  Neuro/Psych spasms anxiety  Prior Studies Any changes since last visit?  no Reviewed MRI of the cervical spine, films and report Reviewed MRI of the knees Reviewed MRI report of the thoracic spine Reviewed x-ray reports from the lumbar spine Physicians involved in your care Any changes since last visit?  no   Family History  Problem Relation Age of Onset  . Diabetes Mother   . Hypertension Mother   . Arthritis Mother   . Cancer Mother     uterine and breast cancer  . Hyperlipidemia Father   . Arthritis Father   . Cancer Father     thyroid, prostate cancer  . Other Father     mass in stomach  . Hyperlipidemia Maternal Grandmother   . Hypertension Maternal Grandmother   . Hyperlipidemia Maternal Grandfather   . Hypertension Maternal Grandfather   . Hyperlipidemia Paternal Grandmother   . Hypertension Paternal Grandmother   . Hyperlipidemia Paternal Grandfather   . Hypertension Paternal Grandfather   . Heart attack Paternal Grandfather   . Sudden death Neg Hx   . Colon cancer Neg Hx   . Esophageal cancer Neg Hx   . Stomach cancer Neg Hx   . Rectal cancer Neg Hx    Social History   Social History  . Marital Status: Married    Spouse Name: N/A  .  Number of Children: 2  . Years of Education: N/A   Occupational History  . FOOTBALL COACH    Social History Main Topics  . Smoking status: Never Smoker   . Smokeless tobacco: Never Used  . Alcohol Use: No     Comment: occasional wine  . Drug Use: No  . Sexual Activity: Not Asked   Other Topics Concern  . None   Social History Narrative   Regular exercise:  No   Caffeine use:  2--32oz cups daily         Past Surgical History  Procedure Laterality Date  . Knee surgery  08/04/08    Right knee-- medial meniscus repair, attenuation anterior cruciate [name] [facility])   . Ankle surgery    . Foot  surgery    . Shoulder surgery    . Varicocele excision    . Plantar fascia release    . Nm myoview ltd  2010  . Doppler echocardiography  2010   Past Medical History  Diagnosis Date  . Hyperlipidemia   . Diabetes mellitus   . Hypertension   . OSA (obstructive sleep apnea)   . Arthritis   . Allergy   . Personal history of colonic polyps   . GERD (gastroesophageal reflux disease)   . Fatty liver 08/06/2011  . Bilateral varicoceles   . Plantar fasciitis   . ADHD (attention deficit hyperactivity disorder)   . PTSD (post-traumatic stress disorder)   . Genital herpes 10/25/2014   BP 145/65 mmHg  Pulse 77  SpO2 96%  Opioid Risk Score:   Fall Risk Score:  `1  Depression screen PHQ 2/9  Depression screen [facility] 2/9 05/15/2015 04/26/2015  Decreased Interest 1 0  Down, Depressed, Hopeless 1 0  PHQ - 2 Score 2 0  Altered sleeping 3 -  Tired, decreased energy 3 -  Change in appetite 3 -  Feeling bad or failure about yourself  0 -  Trouble concentrating 3 -  Moving slowly or fidgety/restless 0 -  Suicidal thoughts 0 -  PHQ-9 Score 14 -  Difficult doing work/chores Extremely dIfficult -     Review of Systems  Constitutional: Positive for diaphoresis and unexpected weight change.  Respiratory: Positive for apnea and wheezing.   Endocrine:       High blood sugars  All other systems reviewed and are negative.      Objective:   Physical Exam  Constitutional: He is oriented to person, place, and time. He appears well-developed.  obese  HENT:  Head: Normocephalic and atraumatic.  Eyes: Conjunctivae and EOM are normal. Pupils are equal, round, and reactive to light.  Neck: No JVD present.  Neck reduced range of motion  Pulmonary/Chest: No stridor.  Musculoskeletal:       Right ankle: Achilles tendon exhibits pain. Achilles tendon exhibits no defect.       Left ankle: Achilles tendon exhibits pain. Achilles tendon exhibits no defect.  Patient has pain with extension greater  than flexion of lumbar spine. He also has some mid back pain with extension. No evidence of scoliosis. Reduced range of motion  Has 50% flexion 50% extension 50% lateral bending of the lumbar and thoracic spine. Cervical spine range of motion also reduced 75% flexion extension lateral bending and rotation.  Neurological: He is alert and oriented to person, place, and time. He has normal strength. He displays no atrophy and no tremor. No cranial nerve deficit or sensory deficit. He exhibits normal muscle tone.  Coordination and gait normal.  Reflex Scores:      Patellar reflexes are 0 on the right side and 0 on the left side.      Achilles reflexes are 0 on the right side and 0 on the left side. Motor strength is 5/5 bilateral deltoid, biceps, triceps, grip, hip flexor, knee extensor, ankle dorsal flexor plantar flexor  Sensation intact to light touch and pinprick in bilateral upper and lower limbs   Psychiatric: He has a normal mood and affect. His behavior is normal. Judgment and thought content normal.  Nursing note and vitals reviewed.  No tenderness palpation along the cervical thoracic or lumbar paraspinal muscles.       Assessment & Plan:  1. Chronic low back pain. No evidence of sciatic component Review of imaging studies plus examination suggests  Lumbar spondylosis as most likely cause rather than discogenic pain.  Will schedule for lumbar medial branch blocks. We'll also sent to therapy for core stabilization exercises. Flexor bias. Weight loss should be helpful in this regard as well.  We discussed pain medications he does not want to be on any chronic opioids. I think as appropriate. He has an intolerance to oxycodone. He is getting by with over-the-counter medications on a when necessary basis. Using Motrin 800 mg but not on a daily basis.  2. Chronic knee pain with patellofemoral disorder, at this point not very symptomatic but as he starts working on his fitness program this may  flareup. Voltaren gel would be helpful if this is the case  3. Bilateral Achilles tendinitis, no obvious abnormalities on MRI of the ankle in 2015. No history of rupture. We'll give stretching and strengthening exercises.  4. Diabetes with mild peripheral neuropathy no objective sensory or motor deficits at this point. We'll follow up with primary care physician in optimizing diabetic control

## 2015-05-21 LAB — TOXASSURE SELECT,+ANTIDEPR,UR: PDF: 0

## 2015-05-23 NOTE — Progress Notes (Signed)
Urine drug screen for this encounter is consistent for having no prescribed medication. Positive for ETG but no ETS.  He is diabetic.

## 2015-06-04 ENCOUNTER — Encounter: Attending: Physical Medicine & Rehabilitation

## 2015-06-04 ENCOUNTER — Encounter: Payer: Self-pay | Admitting: Physical Medicine & Rehabilitation

## 2015-06-04 ENCOUNTER — Ambulatory Visit (HOSPITAL_BASED_OUTPATIENT_CLINIC_OR_DEPARTMENT_OTHER): Admitting: Physical Medicine & Rehabilitation

## 2015-06-04 VITALS — BP 146/69 | HR 88 | Resp 16

## 2015-06-04 DIAGNOSIS — M47816 Spondylosis without myelopathy or radiculopathy, lumbar region: Secondary | ICD-10-CM

## 2015-06-04 DIAGNOSIS — M545 Low back pain: Secondary | ICD-10-CM | POA: Diagnosis not present

## 2015-06-04 DIAGNOSIS — G8929 Other chronic pain: Secondary | ICD-10-CM | POA: Insufficient documentation

## 2015-06-04 DIAGNOSIS — M222X1 Patellofemoral disorders, right knee: Secondary | ICD-10-CM | POA: Insufficient documentation

## 2015-06-04 DIAGNOSIS — K760 Fatty (change of) liver, not elsewhere classified: Secondary | ICD-10-CM | POA: Insufficient documentation

## 2015-06-04 DIAGNOSIS — Z79899 Other long term (current) drug therapy: Secondary | ICD-10-CM | POA: Insufficient documentation

## 2015-06-04 DIAGNOSIS — F4310 Post-traumatic stress disorder, unspecified: Secondary | ICD-10-CM | POA: Diagnosis not present

## 2015-06-04 DIAGNOSIS — E119 Type 2 diabetes mellitus without complications: Secondary | ICD-10-CM | POA: Insufficient documentation

## 2015-06-04 DIAGNOSIS — I10 Essential (primary) hypertension: Secondary | ICD-10-CM | POA: Insufficient documentation

## 2015-06-04 DIAGNOSIS — E785 Hyperlipidemia, unspecified: Secondary | ICD-10-CM | POA: Diagnosis not present

## 2015-06-04 DIAGNOSIS — G4733 Obstructive sleep apnea (adult) (pediatric): Secondary | ICD-10-CM | POA: Insufficient documentation

## 2015-06-04 DIAGNOSIS — K219 Gastro-esophageal reflux disease without esophagitis: Secondary | ICD-10-CM | POA: Insufficient documentation

## 2015-06-04 NOTE — Progress Notes (Signed)
  PROCEDURE RECORD [facility]   Name: Charles Nash. DOB:1966/02/25 MRN: [id number]  Date:06/04/2015  Physician: Alysia Penna, MD    Nurse/CMA: Shumaker RN  Allergies:  Allergies  Allergen Reactions  . Oxycodone-Acetaminophen Hives and Itching    Consent Signed: Yes.    Is patient diabetic? Yes.    CBG today? Did not check  Pregnant: No. LMP: No LMP for male patient. (age 49-55)  Anticoagulants: no Anti-inflammatory: yes (ASA) Antibiotics: no  Procedure: Bilater Medial Branch Block L3-4-5 Position: Prone Start Time: 12:00 End Time:12:11  Fluoro Time:1:07 sec  RN/CMA Health and safety inspector RN    Time 11:42 12:16    BP 146/69 158/77    Pulse 88 85    Respirations 16 16    O2 Sat 96 99    S/S 6 6    Pain Level 4/10 2/10     D/C home with sister Charles Nash, patient A & O X 3, D/C instructions reviewed, and sits independently.

## 2015-06-04 NOTE — Patient Instructions (Signed)

## 2015-06-04 NOTE — Progress Notes (Signed)
Bilateral Lumbar L3, L4  medial branch blocks and L 5 dorsal ramus injection under fluoroscopic guidance  Indication: Lumbar pain which is not relieved by medication management or other conservative care and interfering with self-care and mobility.  Informed consent was obtained after describing risks and benefits of the procedure with the patient, this includes bleeding, infection, paralysis and medication side effects.  The patient wishes to proceed and has given written consent.  The patient was placed in prone position.  The lumbar area was marked and prepped with Betadine.  One mL of 1% lidocaine was injected into each of 6 areas into the skin and subcutaneous tissue.  Then a 22-gauge 5in spinal needle was inserted targeting the junction of the left S1 superior articular process and sacral ala junction. Needle was advanced under fluoroscopic guidance.  Bone contact was made.  Omnipaque 180 was injected x 0.5 mL demonstrating no intravascular uptake.  Then a solution containing one mL of 4 mg per mL dexamethasone and 3 mL of 2% MPF lidocaine was injected x 0.5 mL.  Then the left L5 superior articular process in transverse process junction was targeted.  Bone contact was made.  Omnipaque 180 was injected x 0.5 mL demonstrating no intravascular uptake. Then a solution containing one mL of 4 mg per mL dexamethasone and 3 mL of 2% MPF lidocaine was injected x 0.5 mL.  Then the left L4 superior articular process in transverse process junction was targeted.  Bone contact was made.  Omnipaque 180 was injected x 0.5 mL demonstrating no intravascular uptake.  Then a solution containing one mL of 4 mg per mL dexamethasone and 3 mL if 2% MPF lidocaine was injected x 0.5 mL.  This same procedure was performed on the right side using the same needle, technique and injectate.  Patient tolerated procedure well.  Post procedure instructions were given.  Preinjection pain 4/10 Postinjection pain 2/10

## 2015-06-25 ENCOUNTER — Encounter: Payer: Self-pay | Admitting: Physical Medicine & Rehabilitation

## 2015-06-25 ENCOUNTER — Other Ambulatory Visit: Payer: Self-pay | Admitting: Family

## 2015-06-25 ENCOUNTER — Encounter: Attending: Physical Medicine & Rehabilitation

## 2015-06-25 ENCOUNTER — Ambulatory Visit (HOSPITAL_BASED_OUTPATIENT_CLINIC_OR_DEPARTMENT_OTHER): Admitting: Physical Medicine & Rehabilitation

## 2015-06-25 VITALS — BP 158/85 | HR 81

## 2015-06-25 DIAGNOSIS — M222X1 Patellofemoral disorders, right knee: Secondary | ICD-10-CM | POA: Insufficient documentation

## 2015-06-25 DIAGNOSIS — G4733 Obstructive sleep apnea (adult) (pediatric): Secondary | ICD-10-CM | POA: Insufficient documentation

## 2015-06-25 DIAGNOSIS — G8929 Other chronic pain: Secondary | ICD-10-CM | POA: Diagnosis present

## 2015-06-25 DIAGNOSIS — M545 Low back pain: Secondary | ICD-10-CM | POA: Diagnosis present

## 2015-06-25 DIAGNOSIS — K760 Fatty (change of) liver, not elsewhere classified: Secondary | ICD-10-CM | POA: Diagnosis not present

## 2015-06-25 DIAGNOSIS — E785 Hyperlipidemia, unspecified: Secondary | ICD-10-CM | POA: Insufficient documentation

## 2015-06-25 DIAGNOSIS — M47816 Spondylosis without myelopathy or radiculopathy, lumbar region: Secondary | ICD-10-CM | POA: Diagnosis not present

## 2015-06-25 DIAGNOSIS — F4310 Post-traumatic stress disorder, unspecified: Secondary | ICD-10-CM | POA: Insufficient documentation

## 2015-06-25 DIAGNOSIS — E119 Type 2 diabetes mellitus without complications: Secondary | ICD-10-CM | POA: Insufficient documentation

## 2015-06-25 DIAGNOSIS — K219 Gastro-esophageal reflux disease without esophagitis: Secondary | ICD-10-CM | POA: Insufficient documentation

## 2015-06-25 DIAGNOSIS — I10 Essential (primary) hypertension: Secondary | ICD-10-CM | POA: Diagnosis not present

## 2015-06-25 DIAGNOSIS — Z79899 Other long term (current) drug therapy: Secondary | ICD-10-CM | POA: Insufficient documentation
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
## 2015-06-25 NOTE — Patient Instructions (Signed)
If the next set of medial branch blocks only gives you temporary relief our next step would be lumbar radiofrequency neurotomy

## 2015-06-26 ENCOUNTER — Telehealth: Payer: Self-pay | Admitting: *Deleted

## 2015-06-26 ENCOUNTER — Other Ambulatory Visit: Payer: Self-pay | Admitting: Family

## 2015-06-26 ENCOUNTER — Other Ambulatory Visit: Payer: Self-pay | Admitting: Allergy

## 2015-06-26 MED ORDER — PIOGLITAZONE HCL 30 MG PO TABS: 30.0000 mg | ORAL_TABLET | Freq: Every day | ORAL | Status: DC

## 2015-06-26 MED ORDER — CETIRIZINE HCL 10 MG PO TABS: 10.0000 mg | ORAL_TABLET | Freq: Every day | ORAL | Status: DC

## 2015-06-26 MED ORDER — FLUTICASONE PROPIONATE 50 MCG/ACT NA SUSP: 2.0000 | Freq: Every day | NASAL | Status: DC

## 2015-06-26 MED FILL — HYDROCODON-APAP 5-325: 5-325 | 2 days supply | Qty: 16 | Fill #0

## 2015-06-26 NOTE — Telephone Encounter (Signed)
[name]--FYI:  Pt has f/u with PCP on 07/31/15. Wanted [place] to make a note that he would like to discuss his urinary urgency at that visit.

## 2015-06-26 NOTE — Telephone Encounter (Signed)
Spoke with pt. Last office note of 04/2015 said that he was taking Januvia. He was started on Actos as well. Pt states we changed him to Jardiance several months ago but he still had a lot of Januvia so he finished his current supply and insurance will not cover Januvia now so he would like Rx for Jardiance (see 8/29 phone note). Please advise meloxicam request as well since pt wants to get 90 day supply from [facility]?

## 2015-06-26 NOTE — Telephone Encounter (Signed)
Noted  

## 2015-06-28 ENCOUNTER — Other Ambulatory Visit: Payer: Self-pay | Admitting: Family

## 2015-06-28 NOTE — Telephone Encounter (Signed)
Medication filled to pharmacy as requested.   

## 2015-07-08 ENCOUNTER — Other Ambulatory Visit: Payer: Self-pay | Admitting: Family

## 2015-07-23 ENCOUNTER — Encounter: Payer: Self-pay | Admitting: Skilled Nursing Facility1

## 2015-07-23 ENCOUNTER — Encounter: Attending: Family | Admitting: Skilled Nursing Facility1

## 2015-07-23 VITALS — Ht 73.0 in | Wt 349.0 lb

## 2015-07-23 DIAGNOSIS — E119 Type 2 diabetes mellitus without complications: Secondary | ICD-10-CM | POA: Diagnosis present

## 2015-07-23 DIAGNOSIS — Z713 Dietary counseling and surveillance: Secondary | ICD-10-CM | POA: Insufficient documentation

## 2015-07-23 NOTE — Progress Notes (Signed)
Diabetes Self-Management Education  Visit Type:  Follow-up  Appt. Start Time: 8:00 Appt. End Time: 8:30  07/23/2015  [name], identified by name and date of birth, is a 50 y.o. male with a diagnosis of Diabetes: Type 2.   ASSESSMENT  Height 6\' 1"  (1.854 m), weight 349 lb (158.305 kg). Body mass index is 46.05 kg/(m^2).   Pt states he did not make changes but he will now. Pt admitted he was not ready but due to recent events with people in his life he is ready now.     Diabetes Self-Management Education - 07/23/15 0823    Health Coping   How would you rate your overall health? Poor   Psychosocial Assessment   Patient Belief/Attitude about Diabetes Motivated to manage diabetes   Self-care barriers None   Special Needs None   Learning Readiness Contemplating   Complications   How often do you check your blood sugar? 1-2 times/day   Fasting Blood glucose range (mg/dL) 130-179   Are you checking your feet? Yes   Dietary Intake   Breakfast egg white omelet with veggies   Lunch subway    Dinner fried fish, fried tomatoes   Beverage(s) fruit punch   Exercise   Exercise Type --  Will start [employer]   Patient Education   Previous Diabetes Education Yes (please comment)   Outcomes   Program Status Completed   Subsequent Visit   Since your last visit have you continued or begun to take your medications as prescribed? Yes      Learning Objective:  Patient will have a greater understanding of diabetes self-management. Patient education plan is to attend individual and/or group sessions per assessed needs and concerns.   Plan:   There are no Patient Instructions on file for this visit.   Expected Outcomes:  Demonstrated interest in learning. Expect positive outcomes  Education material provided: No sodium seasonings  If problems or questions, patient to contact team via:  Phone  Future DSME appointment: - 3-4 months

## 2015-07-24 ENCOUNTER — Telehealth: Payer: Self-pay | Admitting: Family Medicine

## 2015-07-24 NOTE — Telephone Encounter (Signed)
Unable to contact. Left message to call our office

## 2015-07-31 ENCOUNTER — Encounter: Payer: Self-pay | Admitting: Physical Medicine & Rehabilitation

## 2015-07-31 ENCOUNTER — Ambulatory Visit (INDEPENDENT_AMBULATORY_CARE_PROVIDER_SITE_OTHER): Admitting: Family

## 2015-07-31 ENCOUNTER — Ambulatory Visit (HOSPITAL_BASED_OUTPATIENT_CLINIC_OR_DEPARTMENT_OTHER): Admitting: Physical Medicine & Rehabilitation

## 2015-07-31 ENCOUNTER — Encounter: Attending: Physical Medicine & Rehabilitation

## 2015-07-31 ENCOUNTER — Encounter: Payer: Self-pay | Admitting: Family

## 2015-07-31 ENCOUNTER — Ambulatory Visit: Admitting: Family

## 2015-07-31 VITALS — BP 154/81 | HR 72 | Resp 14

## 2015-07-31 VITALS — BP 132/86 | HR 77 | Temp 97.8°F | Resp 18 | Ht 72.0 in | Wt 342.0 lb

## 2015-07-31 DIAGNOSIS — K760 Fatty (change of) liver, not elsewhere classified: Secondary | ICD-10-CM | POA: Insufficient documentation

## 2015-07-31 DIAGNOSIS — E1140 Type 2 diabetes mellitus with diabetic neuropathy, unspecified: Secondary | ICD-10-CM | POA: Diagnosis not present

## 2015-07-31 DIAGNOSIS — E119 Type 2 diabetes mellitus without complications: Secondary | ICD-10-CM | POA: Insufficient documentation

## 2015-07-31 DIAGNOSIS — I10 Essential (primary) hypertension: Secondary | ICD-10-CM | POA: Diagnosis not present

## 2015-07-31 DIAGNOSIS — G4733 Obstructive sleep apnea (adult) (pediatric): Secondary | ICD-10-CM | POA: Diagnosis not present

## 2015-07-31 DIAGNOSIS — G8929 Other chronic pain: Secondary | ICD-10-CM | POA: Insufficient documentation

## 2015-07-31 DIAGNOSIS — F329 Major depressive disorder, single episode, unspecified: Secondary | ICD-10-CM

## 2015-07-31 DIAGNOSIS — K219 Gastro-esophageal reflux disease without esophagitis: Secondary | ICD-10-CM | POA: Diagnosis not present

## 2015-07-31 DIAGNOSIS — M47816 Spondylosis without myelopathy or radiculopathy, lumbar region: Secondary | ICD-10-CM | POA: Diagnosis not present

## 2015-07-31 DIAGNOSIS — M222X1 Patellofemoral disorders, right knee: Secondary | ICD-10-CM | POA: Diagnosis not present

## 2015-07-31 DIAGNOSIS — E785 Hyperlipidemia, unspecified: Secondary | ICD-10-CM | POA: Insufficient documentation

## 2015-07-31 DIAGNOSIS — Z79899 Other long term (current) drug therapy: Secondary | ICD-10-CM | POA: Insufficient documentation

## 2015-07-31 DIAGNOSIS — F4310 Post-traumatic stress disorder, unspecified: Secondary | ICD-10-CM | POA: Diagnosis not present

## 2015-07-31 DIAGNOSIS — M545 Low back pain: Secondary | ICD-10-CM | POA: Diagnosis present

## 2015-07-31 DIAGNOSIS — F32A Depression, unspecified: Secondary | ICD-10-CM

## 2015-07-31 LAB — BASIC METABOLIC PANEL
BUN: 12 mg/dL (ref 6–23)
CALCIUM: 9.5 mg/dL (ref 8.4–10.5)
CO2: 28 meq/L (ref 19–32)
Chloride: 102 mEq/L (ref 96–112)
Creatinine, Ser: 0.98 mg/dL (ref 0.40–1.50)
GFR: 104.52 mL/min (ref >60.00)
GLUCOSE: 115 mg/dL — AB (ref 70–99)
Potassium: 3.5 mEq/L (ref 3.5–5.1)
SODIUM: 137 meq/L (ref 135–145)

## 2015-07-31 LAB — HEMOGLOBIN A1C: Hgb A1c MFr Bld: 7.3 % — ABNORMAL HIGH (ref 4.6–6.5)

## 2015-07-31 NOTE — Progress Notes (Signed)
Subjective:    Patient ID: Charles New., male    DOB: [birth year], 50 y.o.   MRN: [id number]  HPI  Charles Nash is a 50 yr old male who present today for follow up.  1) DM2- maintained on jardiance and actos.  Recently met with nutritionist and has stopped juices/teas/lemonades.  Reports good compliance with meds but not checking his blood sugars.  Lab Results  Component Value Date   HGBA1C 7.3* 05/02/2015   HGBA1C 7.0* 01/26/2015   HGBA1C 6.6* 09/15/2014   Lab Results  Component Value Date   MICROALBUR 2.3* 01/26/2015   LDLCALC 44 03/26/2015   CREATININE 0.98 04/06/2015   2) HTN- on tribenzor, hydralazine.  Toprol xl.   BP Readings from Last 3 Encounters:  07/31/15 132/86  06/25/15 158/85  06/04/15 146/69   3) Depression-  Not taking wellbutrin, feels more down and irritable.  Denies SI/HI.    4) Hyperlipidemia- on lipitor. Denies myalgia.    Review of Systems  Constitutional: Negative for fever and fatigue.       + intentional weight loss Wt Readings from Last 3 Encounters: 07/31/15 : 342 lb (155.13 kg) 07/23/15 : 349 lb (158.305 kg) 05/02/15 : 346 lb 9.6 oz (157.217 kg)    Respiratory:       Some DOE  Cardiovascular: Negative for chest pain and leg swelling.  Neurological:       Reports + neuropathy in feet.    See HPI  Past Medical History  Diagnosis Date  . Hyperlipidemia   . Diabetes mellitus   . Hypertension   . OSA (obstructive sleep apnea)   . Arthritis   . Allergy   . Personal history of colonic polyps   . GERD (gastroesophageal reflux disease)   . Fatty liver 08/06/2011  . Bilateral varicoceles   . Plantar fasciitis   . ADHD (attention deficit hyperactivity disorder)   . PTSD (post-traumatic stress disorder)   . Genital herpes 10/25/2014     Social History   Social History  . Marital Status: Married    Spouse Name: N/A  . Number of Children: 2  . Years of Education: N/A   Occupational History  . FOOTBALL COACH     Social History Main Topics  . Smoking status: Never Smoker   . Smokeless tobacco: Never Used  . Alcohol Use: No     Comment: occasional wine  . Drug Use: No  . Sexual Activity: Not on file   Other Topics Concern  . Not on file   Social History Narrative   Regular exercise:  No   Caffeine use:  2--32oz cups daily          Past Surgical History  Procedure Laterality Date  . Knee surgery  08/04/08    Right knee-- medial meniscus repair, attenuation anterior cruciate [name] MD [facility])   . Ankle surgery    . Foot surgery    . Shoulder surgery    . Varicocele excision    . Plantar fascia release    . Nm myoview ltd  2010  . Doppler echocardiography  2010    Family History  Problem Relation Age of Onset  . Diabetes Mother   . Hypertension Mother   . Arthritis Mother   . Cancer Mother     uterine and breast cancer  . Hyperlipidemia Father   . Arthritis Father   . Cancer Father     thyroid, prostate cancer  . Other Father  mass in stomach  . Hyperlipidemia Maternal Grandmother   . Hypertension Maternal Grandmother   . Hyperlipidemia Maternal Grandfather   . Hypertension Maternal Grandfather   . Hyperlipidemia Paternal Grandmother   . Hypertension Paternal Grandmother   . Hyperlipidemia Paternal Grandfather   . Hypertension Paternal Grandfather   . Heart attack Paternal Grandfather   . Sudden death Neg Hx   . Colon cancer Neg Hx   . Esophageal cancer Neg Hx   . Stomach cancer Neg Hx   . Rectal cancer Neg Hx     Allergies  Allergen Reactions  . Oxycodone-Acetaminophen Hives and Itching    Current Outpatient Prescriptions on File Prior to Visit  Medication Sig Dispense Refill  . aspirin 81 MG tablet Take 1 tablet (81 mg total) by mouth daily. 90 tablet 1  . atorvastatin (LIPITOR) 10 MG tablet TAKE 1 TABLET DAILY 90 tablet 1  . Blood Glucose Monitoring Suppl (FREESTYLE LITE) DEVI Use to check blood sugar once a day.  E11.9 1 each 0   . buPROPion (WELLBUTRIN) 75 MG tablet 1/2 tab by mouth twice daily 30 tablet 5  . cetirizine (ZYRTEC) 10 MG tablet Take 1 tablet (10 mg total) by mouth daily. 30 tablet 5  . clomiPHENE (CLOMID) 50 MG tablet Take 0.5 tablets by mouth daily.    . diphenhydrAMINE (BENADRYL) 25 mg capsule Take 25-50 mg by mouth at bedtime as needed.    . Empagliflozin (JARDIANCE PO) Take by mouth.    . fluticasone (FLONASE) 50 MCG/ACT nasal spray Place 2 sprays into both nostrils daily. 16 g 5  . FREESTYLE LITE test strip USE AS INSTRUCTED TO CHECK BLOOD SUGAR ONCE DAILY 100 each 1  . hydrALAZINE (APRESOLINE) 50 MG tablet TAKE 1 TABLET DAILY 90 tablet 1  . JARDIANCE 10 MG TABS tablet TAKE 1 TABLET DAILY 90 tablet 1  . KLOR-CON M20 20 MEQ tablet TAKE 1 TABLET DAILY 90 tablet 0  . Lancets (FREESTYLE) lancets USE TO CHECK BLOOD SUGAR ONCE DAILY 100 each 1  . meloxicam (MOBIC) 7.5 MG tablet TAKE 1 TABLET DAILY 90 tablet 0  . metoprolol succinate (TOPROL-XL) 50 MG 24 hr tablet Take 1 and 1/2 tablet of 50 mg by mouth daily (Patient taking differently: Take 50 mg by mouth at bedtime. Take 1 and 1/2 tablet of 50 mg by mouth daily) 135 tablet 3  . omeprazole (PRILOSEC) 20 MG capsule TAKE 2 CAPSULES EVERY MORNING 180 capsule 1  . OVER THE COUNTER MEDICATION MAXIMIZE LIVING.  Maxfit.  Take 1 tablet daily.    Marland Kitchen OVER THE COUNTER MEDICATION OPTIMAL LIVING fish oil.  Take 2 capsules daily.    . pioglitazone (ACTOS) 30 MG tablet Take 1 tablet (30 mg total) by mouth daily. 90 tablet 1  . Pyridoxine HCl (B-6 PO) Take 1 tablet by mouth daily.    . sildenafil (VIAGRA) 100 MG tablet Take 1/2 to 1 tablet daily as needed 15 tablet 0  . TRIBENZOR 40-10-25 MG TABS TAKE 1 TABLET DAILY 90 tablet 1  . [DISCONTINUED] potassium chloride (K-DUR) 10 MEQ tablet Take 1 tablet (10 mEq total) by mouth daily. 30 tablet 1   Current Facility-Administered Medications on File Prior to Visit  Medication Dose Route Frequency Provider Last Rate Last Dose   . triamcinolone acetonide (KENALOG) 10 MG/ML injection 10 mg  10 mg Other Once [employer], DPM        BP 132/86 mmHg  Pulse 77  Temp(Src) 97.8 F (36.6 C) (Oral)  Resp 18  Ht 6' (1.829 m)  Wt 342 lb (155.13 kg)  BMI 46.37 kg/m2  SpO2 98%       Objective:   Physical Exam  Constitutional: He is oriented to person, place, and time. He appears well-developed and well-nourished. No distress.  HENT:  Head: Normocephalic and atraumatic.  Cardiovascular: Normal rate and regular rhythm.   No murmur heard. Pulmonary/Chest: Effort normal and breath sounds normal. No respiratory distress. He has no wheezes. He has no rales.  Musculoskeletal: He exhibits no edema.  Neurological: He is alert and oriented to person, place, and time.  Skin: Skin is warm and dry.  Psychiatric: He has a normal mood and affect. His behavior is normal. Thought content normal.          Assessment & Plan:

## 2015-07-31 NOTE — Patient Instructions (Addendum)
Please complete lab work prior to leaving. Restart Wellbutrin 75mg  1/2 tab twice daily. Keep up good work with weight loss!

## 2015-07-31 NOTE — Assessment & Plan Note (Signed)
Clinically stable, obtain A1C.

## 2015-07-31 NOTE — Progress Notes (Signed)
  PROCEDURE RECORD [facility]   Name: Charles Nash. DOB:1966/03/06 MRN: [id number]  Date:07/31/2015  Physician: Alysia Penna, MD    Nurse/CMA: Mancel Parsons, CMA  Allergies:  Allergies  Allergen Reactions  . Oxycodone-Acetaminophen Hives and Itching    Consent Signed: Yes.    Is patient diabetic? Yes.    CBG today? ?  Pregnant: No. LMP: No LMP for male patient. (age 49-55)  Anticoagulants: no Anti-inflammatory: no Antibiotics: no  Procedure: bilateral medial branch block  Position: Prone Start Time: 9:32am  End Time: 9:43am  Fluoro Time: 5  RN/CMA Rolan Bucco Samah Lapiana    Time 9:20 am 9:45am    BP 154/81 147/65    Pulse 73 76    Respirations 14 14    O2 Sat 100 98    S/S 6 6    Pain Level 4/10 4/10     D/C home with wife, patient A & O X 3, D/C instructions reviewed, and sits independently.

## 2015-07-31 NOTE — Assessment & Plan Note (Signed)
BP stable, continue current meds. 

## 2015-07-31 NOTE — Assessment & Plan Note (Signed)
LDL at goal, tolerating statin, continue same.  

## 2015-07-31 NOTE — Assessment & Plan Note (Signed)
Deteriorated. Restart wellbutrin.  Pt will buy pill cutter.

## 2015-07-31 NOTE — Patient Instructions (Signed)

## 2015-07-31 NOTE — Progress Notes (Signed)
Pre visit review using our clinic review tool, if applicable. No additional management support is needed unless otherwise documented below in the visit note. 

## 2015-08-06 ENCOUNTER — Other Ambulatory Visit: Payer: Self-pay | Admitting: Family

## 2015-08-07 ENCOUNTER — Telehealth: Payer: Self-pay | Admitting: *Deleted

## 2015-08-07 NOTE — Telephone Encounter (Signed)
Charles Nash-- pt is wanting to restart Zolpidem and get 90 day supply through mail order. Can you sign this or does it still have to go through Dr Charlett Blake?

## 2015-08-07 NOTE — Telephone Encounter (Signed)
Received fax from [facility] for refill of zolpidem. Medication was removed from pt's med list on 05/02/15 as not taking. Attempted to verify request with pt and left message on cell# to return my call. Also sent mychart message.

## 2015-08-07 NOTE — Telephone Encounter (Signed)
I will sign. Tks

## 2015-08-08 MED ORDER — ZOLPIDEM TARTRATE 10 MG PO TABS: 10.0000 mg | ORAL_TABLET | Freq: Every day | ORAL | Status: DC

## 2015-08-08 NOTE — Telephone Encounter (Signed)
Rx faxed to mail order at 2:30pm. Sent pt mychart message.

## 2015-08-08 NOTE — Telephone Encounter (Signed)
Rx printed and forwarded to PCP for signature. 

## 2015-08-13 ENCOUNTER — Encounter: Payer: Self-pay | Admitting: Family

## 2015-08-16 ENCOUNTER — Emergency Department (HOSPITAL_BASED_OUTPATIENT_CLINIC_OR_DEPARTMENT_OTHER)

## 2015-08-16 ENCOUNTER — Emergency Department (HOSPITAL_BASED_OUTPATIENT_CLINIC_OR_DEPARTMENT_OTHER)
Admission: EM | Admit: 2015-08-16 | Discharge: 2015-08-16 | Disposition: A | Discharge status: Home / Self Care | Attending: Emergency Medicine | Admitting: Emergency Medicine

## 2015-08-16 ENCOUNTER — Encounter (HOSPITAL_BASED_OUTPATIENT_CLINIC_OR_DEPARTMENT_OTHER): Payer: Self-pay | Admitting: *Deleted

## 2015-08-16 DIAGNOSIS — E785 Hyperlipidemia, unspecified: Secondary | ICD-10-CM | POA: Insufficient documentation

## 2015-08-16 DIAGNOSIS — R200 Anesthesia of skin: Secondary | ICD-10-CM | POA: Diagnosis not present

## 2015-08-16 DIAGNOSIS — Z794 Long term (current) use of insulin: Secondary | ICD-10-CM | POA: Insufficient documentation

## 2015-08-16 DIAGNOSIS — E119 Type 2 diabetes mellitus without complications: Secondary | ICD-10-CM | POA: Insufficient documentation

## 2015-08-16 DIAGNOSIS — I10 Essential (primary) hypertension: Secondary | ICD-10-CM | POA: Insufficient documentation

## 2015-08-16 DIAGNOSIS — M25561 Pain in right knee: Secondary | ICD-10-CM | POA: Insufficient documentation

## 2015-08-16 MED ORDER — HYDROCODONE-ACETAMINOPHEN 5-325 MG PO TABS: 1.0000 | ORAL_TABLET | Freq: Four times a day (QID) | ORAL | Status: DC | PRN

## 2015-08-16 MED ORDER — HYDROCODONE-ACETAMINOPHEN 5-325 MG PO TABS
1.0000 | ORAL_TABLET | Freq: Once | ORAL | Status: DC
  Filled 2015-08-16: qty 1

## 2015-08-16 NOTE — ED Notes (Signed)
Left knee injury. He stepped off a step and jarred his left knee. States he has been getting injections in his knee x 5 weeks for previous injuries to his knee.

## 2015-08-16 NOTE — Discharge Instructions (Signed)
Your x-ray does not show any serious injury. Continue to ice your knee at rest, keep it elevated while sitting or lying down, and keep the compression dressing around it. Return for worsening symptoms, including inability to walk, numbness or weakness of the leg, or any other symptoms concerning to you.  Knee Pain Knee pain is a very common symptom and can have many causes. Knee pain often goes away when you follow your health care provider's instructions for relieving pain and discomfort at home. However, knee pain can develop into a condition that needs treatment. Some conditions may include:  Arthritis caused by wear and tear (osteoarthritis).  Arthritis caused by swelling and irritation (rheumatoid arthritis or gout).  A cyst or growth in your knee.  An infection in your knee joint.  An injury that will not heal.  Damage, swelling, or irritation of the tissues that support your knee (torn ligaments or tendinitis). If your knee pain continues, additional tests may be ordered to diagnose your condition. Tests may include X-rays or other imaging studies of your knee. You may also need to have fluid removed from your knee. Treatment for ongoing knee pain depends on the cause, but treatment may include:  Medicines to relieve pain or swelling.  Steroid injections in your knee.  Physical therapy.  Surgery. HOME CARE INSTRUCTIONS  Take medicines only as directed by your health care provider.  Rest your knee and keep it raised (elevated) while you are resting.  Do not do things that cause or worsen pain.  Avoid high-impact activities or exercises, such as running, jumping rope, or doing jumping jacks.  Apply ice to the knee area:  Put ice in a plastic bag.  Place a towel between your skin and the bag.  Leave the ice on for 20 minutes, 2-3 times a day.  Ask your health care provider if you should wear an elastic knee support.  Keep a pillow under your knee when you  sleep.  Lose weight if you are overweight. Extra weight can put pressure on your knee.  Do not use any tobacco products, including cigarettes, chewing tobacco, or electronic cigarettes. If you need help quitting, ask your health care provider. Smoking may slow the healing of any bone and joint problems that you may have. SEEK MEDICAL CARE IF:  Your knee pain continues, changes, or gets worse.  You have a fever along with knee pain.  Your knee buckles or locks up.  Your knee becomes more swollen. SEEK IMMEDIATE MEDICAL CARE IF:   Your knee joint feels hot to the touch.  You have chest pain or trouble breathing.   This information is not intended to replace advice given to you by your health care provider. Make sure you discuss any questions you have with your health care provider.   Document Released: 01/26/2007 Document Revised: 04/21/2014 Document Reviewed: 11/14/2013 Elsevier Interactive Patient Education [employer].

## 2015-08-16 NOTE — ED Provider Notes (Signed)
CSN: [id number]     Arrival date & time 08/16/15  1929 History  By signing my name below, I, Julien Nordmann, attest that this documentation has been prepared under the direction and in the presence of Forde Dandy, MD. Electronically Signed: Julien Nordmann, ED Scribe. 08/16/2015. 8:29 PM.     Chief Complaint  Patient presents with  . Knee Injury     The history is provided by the patient. No language interpreter was used.   HPI Comments: Charles Nash. is a 50 y.o. male who has a PMHx of HLD, DM, HTN, and arthritis who presents to the Emergency Department complaining of a sudden onset, gradual worsening, moderate right knee pain onset last night. History of arthritis of the knee w/ injections.  Pt says he missed a step last night and landed directly on his right knee. He has been getting injections in his left knee for the past four weeks and his last injection was Monday. Pt is able to ambulate but expresses increased pain when doing so. He says he has been applying ice non-stop to alleviate his swelling with minor relief. Pt has not taken any medication to alleviate his pain. Pt has a PSHx of right knee surgery, ankle surgery, foot surgery, varicocele extension, and plantar fascia release. Pt denies numbness, weakness or any other injury prior to the fall.   Past Medical History  Diagnosis Date  . Hyperlipidemia   . Diabetes mellitus   . Hypertension   . OSA (obstructive sleep apnea)   . Arthritis   . Allergy   . Personal history of colonic polyps   . GERD (gastroesophageal reflux disease)   . Fatty liver 08/06/2011  . Bilateral varicoceles   . Plantar fasciitis   . ADHD (attention deficit hyperactivity disorder)   . PTSD (post-traumatic stress disorder)   . Genital herpes 10/25/2014   Past Surgical History  Procedure Laterality Date  . Knee surgery  08/04/08    Right knee-- medial meniscus repair, attenuation anterior cruciate [name] MD [place])   .  Ankle surgery    . Foot surgery    . Shoulder surgery    . Varicocele excision    . Plantar fascia release    . Nm myoview ltd  2010  . Doppler echocardiography  2010   Family History  Problem Relation Age of Onset  . Diabetes Mother   . Hypertension Mother   . Arthritis Mother   . Cancer Mother     uterine and breast cancer  . Hyperlipidemia Father   . Arthritis Father   . Cancer Father     thyroid, prostate cancer  . Other Father     mass in stomach  . Hyperlipidemia Maternal Grandmother   . Hypertension Maternal Grandmother   . Hyperlipidemia Maternal Grandfather   . Hypertension Maternal Grandfather   . Hyperlipidemia Paternal Grandmother   . Hypertension Paternal Grandmother   . Hyperlipidemia Paternal Grandfather   . Hypertension Paternal Grandfather   . Heart attack Paternal Grandfather   . Sudden death Neg Hx   . Colon cancer Neg Hx   . Esophageal cancer Neg Hx   . Stomach cancer Neg Hx   . Rectal cancer Neg Hx    Social History  Substance Use Topics  . Smoking status: Never Smoker   . Smokeless tobacco: Never Used  . Alcohol Use: No     Comment: occasional wine    Review of Systems  Musculoskeletal: Positive for  arthralgias (right knee pain).  Neurological: Positive for numbness. Negative for weakness.  All other systems reviewed and are negative.     Allergies  Oxycodone-acetaminophen  Home Medications   Prior to Admission medications   Medication Sig Start Date End Date Taking? Authorizing Provider  aspirin 81 MG tablet Take 1 tablet (81 mg total) by mouth daily. 02/26/15   Debbrah Alar, NP  atorvastatin (LIPITOR) 10 MG tablet TAKE 1 TABLET DAILY 02/21/15   Debbrah Alar, NP  Blood Glucose Monitoring Suppl (FREESTYLE LITE) DEVI Use to check blood sugar once a day.  E11.9 09/13/14   Debbrah Alar, NP  cetirizine (ZYRTEC) 10 MG tablet Take 1 tablet (10 mg total) by mouth daily. 06/26/15   Leda Roys, MD  clomiPHENE (CLOMID) 50 MG  tablet Take 0.5 tablets by mouth daily. 05/04/15   Historical Provider, MD  diphenhydrAMINE (BENADRYL) 25 mg capsule Take 25-50 mg by mouth at bedtime as needed.    Historical Provider, MD  Empagliflozin (JARDIANCE PO) Take by mouth.    Historical Provider, MD  fluticasone (FLONASE) 50 MCG/ACT nasal spray Place 2 sprays into both nostrils daily. 06/26/15   Leda Roys, MD  FREESTYLE LITE test strip USE AS INSTRUCTED TO CHECK BLOOD SUGAR ONCE DAILY 04/02/15   Debbrah Alar, NP  hydrALAZINE (APRESOLINE) 50 MG tablet TAKE 1 TABLET DAILY 06/26/15   Debbrah Alar, NP  HYDROcodone-acetaminophen (NORCO/VICODIN) 5-325 MG tablet Take 1 tablet by mouth every 6 (six) hours as needed for moderate pain or severe pain. 08/16/15   Forde Dandy, MD  JARDIANCE 10 MG TABS tablet TAKE 1 TABLET DAILY 06/26/15   Debbrah Alar, NP  KLOR-CON M20 20 MEQ tablet TAKE 1 TABLET DAILY 06/28/15   Debbrah Alar, NP  Lancets (FREESTYLE) lancets USE TO CHECK BLOOD SUGAR ONCE DAILY 04/02/15   Debbrah Alar, NP  meloxicam (MOBIC) 7.5 MG tablet TAKE 1 TABLET DAILY 06/26/15   Debbrah Alar, NP  metoprolol succinate (TOPROL-XL) 50 MG 24 hr tablet Take 1 and 1/2 tablet of 50 mg by mouth daily Patient taking differently: Take 50 mg by mouth at bedtime. Take 1 and 1/2 tablet of 50 mg by mouth daily 01/08/15   Pixie Casino, MD  omeprazole (PRILOSEC) 20 MG capsule TAKE 2 CAPSULES EVERY MORNING 03/16/15   Debbrah Alar, NP  OVER THE COUNTER MEDICATION MAXIMIZE LIVING.  Maxfit.  Take 1 tablet daily.    Historical Provider, MD  OVER THE COUNTER MEDICATION OPTIMAL LIVING fish oil.  Take 2 capsules daily.    Historical Provider, MD  pioglitazone (ACTOS) 30 MG tablet Take 1 tablet (30 mg total) by mouth daily. 06/26/15   Debbrah Alar, NP  Pyridoxine HCl (B-6 PO) Take 1 tablet by mouth daily.    Historical Provider, MD  sildenafil (VIAGRA) 100 MG tablet Take 1/2 to 1 tablet daily as needed 03/30/13   Debbrah Alar, NP  TRIBENZOR 40-10-25 MG TABS TAKE 1 TABLET DAILY 07/09/15   Debbrah Alar, NP  zolpidem (AMBIEN) 10 MG tablet Take 1 tablet (10 mg total) by mouth daily. 08/08/15   Debbrah Alar, NP   Triage vitals: BP 169/92 mmHg  Pulse 84  Temp(Src) 98.6 F (37 C) (Oral)  Resp 20  Ht 6\' 1"  (1.854 m)  Wt 345 lb (156.491 kg)  BMI 45.53 kg/m2  SpO2 96% Physical Exam Physical Exam  Nursing note and vitals reviewed. Constitutional: Well developed, well nourished, non-toxic, and in no acute distress, obese Head: Normocephalic and atraumatic.  Mouth/Throat: Oropharynx  is clear and moist.  Neck: Normal range of motion. Neck supple.  Cardiovascular: Normal rate and regular rhythm.  +2 DP pulse in RLE Pulmonary/Chest: Effort normal and breath sounds normal.  Abdominal: Soft. There is no tenderness. There is no rebound and no guarding.  Musculoskeletal: Normal range of motion. full ROM, TTP superior to right patella, normal extension of knee against gravity, no deformity, no swelling Neurological: Alert, no facial droop, fluent speech, moves all extremities symmetrically Skin: Skin is warm and dry.  Psychiatric: Cooperative  ED Course  Procedures  DIAGNOSTIC STUDIES: Oxygen Saturation is 96% on RA, normal by my interpretation.  COORDINATION OF CARE:  8:27 PM Will order x-ray of right knee. Discussed treatment plan which includes vicodin with pt at bedside and pt agreed to plan.  Labs Review Labs Reviewed - No data to display  Imaging Review Dg Knee Complete 4 Views Right  08/16/2015  CLINICAL DATA:  Twisted left knee yesterday EXAM: RIGHT KNEE - COMPLETE 4+ VIEW COMPARISON:  01/17/2011 FINDINGS: Four views of the right knee submitted. There is diffuse narrowing of joint space most significant medial compartment. There is spurring of medial femoral condyle and medial tibial plateau. Mild narrowing of patellofemoral joint space. Small joint effusion. No acute fracture or  subluxation. IMPRESSION: No acute fracture or subluxation. Osteoarthritic changes with progression from prior exam. Electronically Signed   By: Lahoma Crocker M.D.   On: 08/16/2015 20:28   I have personally reviewed and evaluated these images and lab results as part of my medical decision-making.   EKG Interpretation None      MDM   Final diagnoses:  Right knee pain   50 year old male with history of osteoarthritis of the right knee who presents with right knee pain after fall yesterday. Vital signs within normal limits, and he is well-appearing. Focused exam of the right lower extremity reveals no significant swelling, deformity, or overlying skin changes. Has tenderness to palpation to the superior aspect of the knee, but with full range of motion and is able to ambulate. X-ray does not reveal fracture. Exam not concerning for ligamentous or tendinous injury. I discussed continued supportive care for home. Strict return and follow-up instructions reviewed. He expressed understanding of all discharge instructions and felt comfortable with the plan of care.   I personally performed the services described in this documentation, which was scribed in my presence. The recorded information has been reviewed and is accurate.   Forde Dandy, MD 08/16/15 [phone]

## 2015-08-19 ENCOUNTER — Other Ambulatory Visit: Payer: Self-pay | Admitting: Family

## 2015-08-20 ENCOUNTER — Encounter: Payer: Self-pay | Admitting: Family

## 2015-08-20 ENCOUNTER — Ambulatory Visit (INDEPENDENT_AMBULATORY_CARE_PROVIDER_SITE_OTHER): Admitting: Family

## 2015-08-20 VITALS — HR 77 | Temp 98.4°F | Resp 16 | Ht 72.0 in | Wt 338.0 lb

## 2015-08-20 DIAGNOSIS — F411 Generalized anxiety disorder: Secondary | ICD-10-CM | POA: Diagnosis not present

## 2015-08-20 MED ORDER — SERTRALINE HCL 50 MG PO TABS: ORAL_TABLET | ORAL | Status: DC

## 2015-08-20 MED ORDER — BUPROPION HCL 75 MG PO TABS: 75.0000 mg | ORAL_TABLET | Freq: Two times a day (BID) | ORAL | Status: DC

## 2015-08-20 MED FILL — SERTRALINE HCL 50 MG TABLET: 50 | 30 days supply | Qty: 30 | Fill #0

## 2015-08-20 NOTE — Progress Notes (Signed)
Pre visit review using our clinic review tool, if applicable. No additional management support is needed unless otherwise documented below in the visit note. 

## 2015-08-20 NOTE — Assessment & Plan Note (Signed)
Deteriorated. 25 min spent with pt . >50% of this time was spent counseling patient on anxiety and depression. Will initiate zoloft 50mg  once daily.  I instructed pt to start 1/2 tablet once daily for 1 week and then increase to a full tablet once daily on week two as tolerated.  We discussed common side effects such as nausea, drowsiness and weight gain.  Also discussed rare but serious side effect of suicide ideation.  he is instructed to discontinue medication go directly to ED if this occurs.  Pt verbalizes understanding.  Plan follow up in 1 month to evaluate progress.

## 2015-08-20 NOTE — Patient Instructions (Signed)
Please start zoloft 50mg  1/2 tab once daily for 1 week, then increase to 1 tab once daily on week two.
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
## 2015-08-30 ENCOUNTER — Ambulatory Visit: Admitting: Physical Medicine & Rehabilitation

## 2015-08-30 ENCOUNTER — Encounter: Attending: Physical Medicine & Rehabilitation

## 2015-08-30 DIAGNOSIS — F4310 Post-traumatic stress disorder, unspecified: Secondary | ICD-10-CM | POA: Insufficient documentation

## 2015-08-30 DIAGNOSIS — G8929 Other chronic pain: Secondary | ICD-10-CM | POA: Insufficient documentation

## 2015-08-30 DIAGNOSIS — Z79899 Other long term (current) drug therapy: Secondary | ICD-10-CM | POA: Insufficient documentation

## 2015-08-30 DIAGNOSIS — E119 Type 2 diabetes mellitus without complications: Secondary | ICD-10-CM | POA: Insufficient documentation

## 2015-08-30 DIAGNOSIS — G4733 Obstructive sleep apnea (adult) (pediatric): Secondary | ICD-10-CM | POA: Insufficient documentation

## 2015-08-30 DIAGNOSIS — I10 Essential (primary) hypertension: Secondary | ICD-10-CM | POA: Insufficient documentation

## 2015-08-30 DIAGNOSIS — M545 Low back pain: Secondary | ICD-10-CM | POA: Insufficient documentation

## 2015-08-30 DIAGNOSIS — E785 Hyperlipidemia, unspecified: Secondary | ICD-10-CM | POA: Insufficient documentation

## 2015-08-30 DIAGNOSIS — K219 Gastro-esophageal reflux disease without esophagitis: Secondary | ICD-10-CM | POA: Insufficient documentation

## 2015-08-30 DIAGNOSIS — M222X1 Patellofemoral disorders, right knee: Secondary | ICD-10-CM | POA: Insufficient documentation

## 2015-08-30 DIAGNOSIS — K760 Fatty (change of) liver, not elsewhere classified: Secondary | ICD-10-CM | POA: Insufficient documentation

## 2015-08-30 DIAGNOSIS — M47816 Spondylosis without myelopathy or radiculopathy, lumbar region: Secondary | ICD-10-CM | POA: Insufficient documentation

## 2015-09-04 ENCOUNTER — Encounter: Payer: Self-pay | Admitting: Physical Medicine & Rehabilitation

## 2015-09-04 ENCOUNTER — Ambulatory Visit (HOSPITAL_BASED_OUTPATIENT_CLINIC_OR_DEPARTMENT_OTHER): Admitting: Physical Medicine & Rehabilitation

## 2015-09-04 ENCOUNTER — Encounter

## 2015-09-04 ENCOUNTER — Ambulatory Visit: Admitting: Physical Medicine & Rehabilitation

## 2015-09-04 VITALS — BP 137/64 | HR 65 | Resp 14

## 2015-09-04 DIAGNOSIS — G8929 Other chronic pain: Secondary | ICD-10-CM | POA: Diagnosis present

## 2015-09-04 DIAGNOSIS — I10 Essential (primary) hypertension: Secondary | ICD-10-CM | POA: Diagnosis not present

## 2015-09-04 DIAGNOSIS — Z79899 Other long term (current) drug therapy: Secondary | ICD-10-CM | POA: Diagnosis not present

## 2015-09-04 DIAGNOSIS — M47816 Spondylosis without myelopathy or radiculopathy, lumbar region: Secondary | ICD-10-CM

## 2015-09-04 DIAGNOSIS — G4733 Obstructive sleep apnea (adult) (pediatric): Secondary | ICD-10-CM | POA: Diagnosis not present

## 2015-09-04 DIAGNOSIS — K760 Fatty (change of) liver, not elsewhere classified: Secondary | ICD-10-CM | POA: Diagnosis not present

## 2015-09-04 DIAGNOSIS — M222X1 Patellofemoral disorders, right knee: Secondary | ICD-10-CM | POA: Diagnosis not present

## 2015-09-04 DIAGNOSIS — M545 Low back pain: Secondary | ICD-10-CM | POA: Diagnosis not present

## 2015-09-04 DIAGNOSIS — F4310 Post-traumatic stress disorder, unspecified: Secondary | ICD-10-CM | POA: Diagnosis not present

## 2015-09-04 DIAGNOSIS — E785 Hyperlipidemia, unspecified: Secondary | ICD-10-CM | POA: Diagnosis not present

## 2015-09-04 DIAGNOSIS — K219 Gastro-esophageal reflux disease without esophagitis: Secondary | ICD-10-CM | POA: Diagnosis not present

## 2015-09-04 DIAGNOSIS — E119 Type 2 diabetes mellitus without complications: Secondary | ICD-10-CM | POA: Diagnosis not present

## 2015-09-04 NOTE — Progress Notes (Signed)
  PROCEDURE RECORD [facility]   Name: Charles Nash. DOB:[birth year] MRN: [id number]  Date:09/04/2015  Physician: Alysia Penna, MD    Nurse/CMA: Zendayah Hardgrave, CMA  Allergies:  Allergies  Allergen Reactions  . Oxycodone-Acetaminophen Hives and Itching    Consent Signed: Yes.    Is patient diabetic? Yes.    CBG today? ?  Pregnant: No. LMP: No LMP for male patient. (age 49-55)  Anticoagulants: no Anti-inflammatory: no Antibiotics: no  Procedure: bilateral medial branch block  Position: Prone Start Time: 11:12am      End Time: 11:25am  Fluoro Time:  1:00  RN/CMA Harlene Petralia, CMA Seward Coran, CMA    Time 11:00am 11:30am    BP 137/64 135/64    Pulse 65 65    Respirations 14 14    O2 Sat 96 97    S/S 6 6    Pain Level 4/10 2/10     D/C home with wife, patient A & O X 3, D/C instructions reviewed, and sits independently.

## 2015-09-04 NOTE — Progress Notes (Signed)
Bilateral Lumbar L3, L4  medial branch blocks and L 5 dorsal ramus injection under fluoroscopic guidance  Indication: Lumbar pain which is not relieved by medication management or other conservative care and interfering with self-care and mobility.  Informed consent was obtained after describing risks and benefits of the procedure with the patient, this includes bleeding, infection, paralysis and medication side effects.  The patient wishes to proceed and has given written consent.  The patient was placed in prone position.  The lumbar area was marked and prepped with Betadine.  One mL of 1% lidocaine was injected into each of 6 areas into the skin and subcutaneous tissue.  Then a 22-gauge 5in spinal needle was inserted targeting the junction of the left S1 superior articular process and sacral ala junction. Needle was advanced under fluoroscopic guidance.  Bone contact was made.  Omnipaque 180 was injected x 0.5 mL demonstrating no intravascular uptake.  Then a solution containing one mL of 4 mg per mL dexamethasone and 3 mL of 2% MPF lidocaine was injected x 0.5 mL.  Then the left L5 superior articular process in transverse process junction was targeted.  Bone contact was made.  Omnipaque 180 was injected x 0.5 mL demonstrating no intravascular uptake. Then a solution containing one mL of 4 mg per mL dexamethasone and 3 mL of 2% MPF lidocaine was injected x 0.5 mL.  Then the left L4 superior articular process in transverse process junction was targeted.  Bone contact was made.  Omnipaque 180 was injected x 0.5 mL demonstrating no intravascular uptake.  Then a solution containing one mL of 4 mg per mL dexamethasone and 3 mL if 2% MPF lidocaine was injected x 0.5 mL.  This same procedure was performed on the right side using the same needle, technique and injectate.  Patient tolerated procedure well.  Post procedure instructions were given.  Preinjection pain 4/10 Postinjection pain 2/10

## 2015-09-04 NOTE — Patient Instructions (Signed)

## 2015-09-04 NOTE — Progress Notes (Signed)
Bilateral Lumbar L3, L4  medial branch blocks and L 5 dorsal ramus injection under fluoroscopic guidance  Indication: Lumbar pain which is not relieved by medication management or other conservative care and interfering with self-care and mobility.  Informed consent was obtained after describing risks and benefits of the procedure with the patient, this includes bleeding, infection, paralysis and medication side effects.  The patient wishes to proceed and has given written consent.  The patient was placed in prone position.  The lumbar area was marked and prepped with Betadine.  One mL of 1% lidocaine was injected into each of 6 areas into the skin and subcutaneous tissue.  Then a 22-gauge 5in spinal needle was inserted targeting the junction of the left S1 superior articular process and sacral ala junction. Needle was advanced under fluoroscopic guidance.  Bone contact was made.  Omnipaque 180 was injected x 0.5 mL demonstrating no intravascular uptake.  Then a solution containing one mL of 4 mg per mL dexamethasone and 3 mL of 2% MPF lidocaine was injected x 0.5 mL.  Then the left L5 superior articular process in transverse process junction was targeted.  Bone contact was made.  Omnipaque 180 was injected x 0.5 mL demonstrating no intravascular uptake. Then a solution containing one mL of 4 mg per mL dexamethasone and 3 mL of 2% MPF lidocaine was injected x 0.5 mL.  Then the left L4 superior articular process in transverse process junction was targeted.  Bone contact was made.  Omnipaque 180 was injected x 0.5 mL demonstrating no intravascular uptake.  Then a solution containing one mL of 4 mg per mL dexamethasone and 3 mL if 2% MPF lidocaine was injected x 0.5 mL.  This same procedure was performed on the right side using the same needle, technique and injectate.  Patient tolerated procedure well.  Post procedure instructions were given. 

## 2015-09-11 ENCOUNTER — Other Ambulatory Visit: Payer: Self-pay | Admitting: Family

## 2015-09-13 ENCOUNTER — Other Ambulatory Visit: Payer: Self-pay | Admitting: Family

## 2015-09-17 ENCOUNTER — Encounter: Payer: Self-pay | Admitting: Family

## 2015-09-17 ENCOUNTER — Telehealth: Payer: Self-pay | Admitting: Family

## 2015-09-17 ENCOUNTER — Ambulatory Visit (INDEPENDENT_AMBULATORY_CARE_PROVIDER_SITE_OTHER): Admitting: Family

## 2015-09-17 VITALS — BP 131/75 | HR 72 | Temp 97.6°F | Resp 18 | Ht 72.0 in | Wt 331.4 lb

## 2015-09-17 DIAGNOSIS — F411 Generalized anxiety disorder: Secondary | ICD-10-CM | POA: Diagnosis not present

## 2015-09-17 DIAGNOSIS — R221 Localized swelling, mass and lump, neck: Secondary | ICD-10-CM

## 2015-09-17 MED ORDER — SERTRALINE HCL 50 MG PO TABS: ORAL_TABLET | ORAL | Status: DC

## 2015-09-17 MED FILL — SERTRALINE HCL 50 MG TABLET: 50 | 30 days supply | Qty: 30 | Fill #0

## 2015-09-17 NOTE — Patient Instructions (Signed)
Continue zoloft. Keep up the good work with diet and weight loss.

## 2015-09-17 NOTE — Progress Notes (Signed)
Subjective:    Patient ID: Charles New., male    DOB: May 10, 1965, 50 y.o.   MRN: [id number]  HPI  Charles Nash is a 50 yr old male who presents today for follow up of his anxiety. Last visit zoloft was initiated for his anxiety.  Reports that zoloft is helping him considerably.  Feels calm and relaxed, better able to focus. He stopped bupropion 10 days ago. Feels much better. He has been working hard on losing weight.  He has lost 7 pounds since his last visit 1 month ago.   Wt Readings from Last 3 Encounters:  09/17/15 331 lb 6.4 oz (150.322 kg)  08/20/15 338 lb (153.316 kg)  08/16/15 345 lb (156.491 kg)   He also reports + swelling on the left side of his neck near his collar bone. Notes that he has been painting a lot recently with an overhead roller.  Has some tenderness overlying his collar bone.   Review of Systems See HPI  Past Medical History  Diagnosis Date  . Hyperlipidemia   . Diabetes mellitus   . Hypertension   . OSA (obstructive sleep apnea)   . Arthritis   . Allergy   . Personal history of colonic polyps   . GERD (gastroesophageal reflux disease)   . Fatty liver 08/06/2011  . Bilateral varicoceles   . Plantar fasciitis   . ADHD (attention deficit hyperactivity disorder)   . PTSD (post-traumatic stress disorder)   . Genital herpes 10/25/2014     Social History   Social History  . Marital Status: Married    Spouse Name: N/A  . Number of Children: 2  . Years of Education: N/A   Occupational History  . FOOTBALL COACH    Social History Main Topics  . Smoking status: Never Smoker   . Smokeless tobacco: Never Used  . Alcohol Use: No     Comment: occasional wine  . Drug Use: No  . Sexual Activity: Not on file   Other Topics Concern  . Not on file   Social History Narrative   Regular exercise:  No   Caffeine use:  2--32oz cups daily          Past Surgical History  Procedure Laterality Date  . Knee surgery  08/04/08    Right knee--  medial meniscus repair, attenuation anterior cruciate [name] MD [facility])   . Ankle surgery    . Foot surgery    . Shoulder surgery    . Varicocele excision    . Plantar fascia release    . Nm myoview ltd  2010  . Doppler echocardiography  2010    Family History  Problem Relation Age of Onset  . Diabetes Mother   . Hypertension Mother   . Arthritis Mother   . Cancer Mother     uterine and breast cancer  . Hyperlipidemia Father   . Arthritis Father   . Cancer Father     thyroid, prostate cancer  . Other Father     mass in stomach  . Hyperlipidemia Maternal Grandmother   . Hypertension Maternal Grandmother   . Hyperlipidemia Maternal Grandfather   . Hypertension Maternal Grandfather   . Hyperlipidemia Paternal Grandmother   . Hypertension Paternal Grandmother   . Hyperlipidemia Paternal Grandfather   . Hypertension Paternal Grandfather   . Heart attack Paternal Grandfather   . Sudden death Neg Hx   . Colon cancer Neg Hx   . Esophageal cancer Neg Hx   .  Stomach cancer Neg Hx   . Rectal cancer Neg Hx     Allergies  Allergen Reactions  . Oxycodone-Acetaminophen Hives and Itching    Current Outpatient Prescriptions on File Prior to Visit  Medication Sig Dispense Refill  . aspirin 81 MG tablet Take 1 tablet (81 mg total) by mouth daily. 90 tablet 1  . atorvastatin (LIPITOR) 10 MG tablet TAKE 1 TABLET DAILY 90 tablet 0  . Blood Glucose Monitoring Suppl (FREESTYLE LITE) DEVI Use to check blood sugar once a day.  E11.9 1 each 0  . cetirizine (ZYRTEC) 10 MG tablet Take 1 tablet (10 mg total) by mouth daily. 30 tablet 5  . clomiPHENE (CLOMID) 50 MG tablet Take 0.5 tablets by mouth daily.    . diphenhydrAMINE (BENADRYL) 25 mg capsule Take 25-50 mg by mouth at bedtime as needed.    . Empagliflozin (JARDIANCE PO) Take by mouth.    . fluticasone (FLONASE) 50 MCG/ACT nasal spray Place 2 sprays into both nostrils daily. 16 g 5  . FREESTYLE LITE test strip USE AS  INSTRUCTED TO CHECK BLOOD SUGAR ONCE DAILY 100 each 1  . hydrALAZINE (APRESOLINE) 50 MG tablet TAKE 1 TABLET DAILY 90 tablet 1  . HYDROcodone-acetaminophen (NORCO/VICODIN) 5-325 MG tablet Take 1 tablet by mouth every 6 (six) hours as needed for moderate pain or severe pain. 5 tablet 0  . JARDIANCE 10 MG TABS tablet TAKE 1 TABLET DAILY 90 tablet 1  . KLOR-CON M20 20 MEQ tablet TAKE 1 TABLET DAILY 90 tablet 0  . Lancets (FREESTYLE) lancets USE TO CHECK BLOOD SUGAR ONCE DAILY 100 each 1  . meloxicam (MOBIC) 7.5 MG tablet TAKE 1 TABLET DAILY 90 tablet 0  . metoprolol succinate (TOPROL-XL) 50 MG 24 hr tablet Take 1 and 1/2 tablet of 50 mg by mouth daily (Patient taking differently: Take 50 mg by mouth at bedtime. Take 1 and 1/2 tablet of 50 mg by mouth daily) 135 tablet 3  . omeprazole (PRILOSEC) 20 MG capsule Take 2 capsules (40 mg total) by mouth every morning. 180 capsule 1  . OVER THE COUNTER MEDICATION MAXIMIZE LIVING.  Maxfit.  Take 1 tablet daily.    Marland Kitchen OVER THE COUNTER MEDICATION OPTIMAL LIVING fish oil.  Take 2 capsules daily.    . pioglitazone (ACTOS) 30 MG tablet Take 1 tablet (30 mg total) by mouth daily. 90 tablet 1  . Pyridoxine HCl (B-6 PO) Take 1 tablet by mouth daily.    . sildenafil (VIAGRA) 100 MG tablet Take 1/2 to 1 tablet daily as needed 15 tablet 0  . TRIBENZOR 40-10-25 MG TABS TAKE 1 TABLET DAILY 90 tablet 1  . zolpidem (AMBIEN) 10 MG tablet Take 1 tablet (10 mg total) by mouth daily. 90 tablet 0  . [DISCONTINUED] potassium chloride (K-DUR) 10 MEQ tablet Take 1 tablet (10 mEq total) by mouth daily. 30 tablet 1   Current Facility-Administered Medications on File Prior to Visit  Medication Dose Route Frequency Provider Last Rate Last Dose  . triamcinolone acetonide (KENALOG) 10 MG/ML injection 10 mg  10 mg Other Once Richard Sikora, DPM        BP 131/75 mmHg  Pulse 72  Temp(Src) 97.6 F (36.4 C) (Oral)  Resp 18  Ht 6' (1.829 m)  Wt 331 lb 6.4 oz (150.322 kg)  BMI 44.94  kg/m2  SpO2 98%       Objective:   Physical Exam  Constitutional: He is oriented to person, place, and time. He appears  well-developed and well-nourished. No distress.  Neck: Neck supple. No thyroid mass present.    + edema noted above left clavicle.  No erythema or tenderness to palpation.    Lymphadenopathy:    He has no cervical adenopathy.  Neurological: He is alert and oriented to person, place, and time.  Skin: Skin is warm and dry.  Psychiatric: He has a normal mood and affect. His behavior is normal. Judgment and thought content normal.          Assessment & Plan:  Neck swelling- case discussed with Dr. Charlett Blake. Also discussed with Dr. Jeannine Kitten, radiology- advised CT with contrast to further evaluate.

## 2015-09-17 NOTE — Progress Notes (Signed)
Pre visit review using our clinic review tool, if applicable. No additional management support is needed unless otherwise documented below in the visit note. 

## 2015-09-17 NOTE — Telephone Encounter (Signed)
Left message for pt to return my call.

## 2015-09-17 NOTE — Telephone Encounter (Signed)
Tricia-Please let pt know that I spoke with the radiologist and they recommend a CT of the neck.  I have placed order but since they want contrast, we will need to have him return for a bmet first.

## 2015-09-17 NOTE — Telephone Encounter (Signed)
Charles Nash-- pt is going out of town on Wednesday and will be back next Tuesday. Can you check with his insurance ([employer]) about authorization for neck CT? It has not been scheduled yet as he has to complete labs tomorrow.

## 2015-09-17 NOTE — Assessment & Plan Note (Signed)
Stable/improved on zoloft.  Continue same. Advised pt OK to remain off of wellbutrin.

## 2015-09-18 ENCOUNTER — Ambulatory Visit (HOSPITAL_BASED_OUTPATIENT_CLINIC_OR_DEPARTMENT_OTHER)
Admission: RE | Admit: 2015-09-18 | Discharge: 2015-09-18 | Disposition: A | Discharge status: Home / Self Care | Source: Ambulatory Visit | Attending: Family | Admitting: Family

## 2015-09-18 ENCOUNTER — Encounter: Payer: Self-pay | Admitting: Family

## 2015-09-18 ENCOUNTER — Other Ambulatory Visit (INDEPENDENT_AMBULATORY_CARE_PROVIDER_SITE_OTHER)

## 2015-09-18 DIAGNOSIS — M47892 Other spondylosis, cervical region: Secondary | ICD-10-CM | POA: Diagnosis not present

## 2015-09-18 DIAGNOSIS — R221 Localized swelling, mass and lump, neck: Secondary | ICD-10-CM

## 2015-09-18 LAB — BASIC METABOLIC PANEL
BUN: 11 mg/dL (ref 6–23)
CHLORIDE: 103 meq/L (ref 96–112)
CO2: 28 meq/L (ref 19–32)
CREATININE: 0.94 mg/dL (ref 0.40–1.50)
Calcium: 9.5 mg/dL (ref 8.4–10.5)
GFR: 109.61 mL/min (ref >60.00)
Glucose, Bld: 124 mg/dL — ABNORMAL HIGH (ref 70–99)
POTASSIUM: 3.5 meq/L (ref 3.5–5.1)
Sodium: 139 mEq/L (ref 135–145)

## 2015-09-18 MED ORDER — IOPAMIDOL (ISOVUE-300) INJECTION 61%
100.0000 mL | Freq: Once | INTRAVENOUS | Status: AC | PRN
  Administered 2015-09-18: 75 mL via INTRAVENOUS

## 2015-09-18 MED ORDER — OMEPRAZOLE 20 MG PO CPDR: 40.0000 mg | DELAYED_RELEASE_CAPSULE | ORAL | Status: DC

## 2015-09-18 MED FILL — OMEPRAZOLE DR 20 MG CAPSULE: 20 | 14 days supply | Qty: 28 | Fill #0

## 2015-09-18 NOTE — Telephone Encounter (Signed)
See 09/17/15 patient email. Also notified pt when he came for labs that he could stop in radiology on his way out and schedule CT.

## 2015-09-21 ENCOUNTER — Encounter: Payer: Self-pay | Admitting: Family

## 2015-09-21 NOTE — Telephone Encounter (Signed)
Debbrah Alar, NP  Ronny Flurry, CMA            Ct looks good.       Attempted to notify pt of normal CT and received message that his voice mailbox is full. Sent [employer].

## 2015-09-25 ENCOUNTER — Telehealth: Payer: Self-pay

## 2015-09-25 ENCOUNTER — Encounter

## 2015-09-25 ENCOUNTER — Ambulatory Visit: Admitting: Physical Medicine & Rehabilitation

## 2015-09-25 NOTE — Telephone Encounter (Signed)
He would like to have that called into [facility] on [address]

## 2015-09-25 NOTE — Telephone Encounter (Signed)
Pt is going to reschedule his RF procedure. He would like to get an rx of valium before his appt. Please advise.

## 2015-09-25 NOTE — Telephone Encounter (Signed)
Valium 10mg  #1  No RF Take one tab prior to injection

## 2015-09-26 NOTE — Telephone Encounter (Signed)
RX called to [facility] [phone]

## 2015-10-01 ENCOUNTER — Other Ambulatory Visit: Payer: Self-pay | Admitting: Family

## 2015-10-02 ENCOUNTER — Encounter: Payer: Self-pay | Admitting: Family

## 2015-10-16 ENCOUNTER — Ambulatory Visit

## 2015-10-23 ENCOUNTER — Telehealth: Payer: Self-pay | Admitting: Physical Medicine & Rehabilitation

## 2015-10-23 ENCOUNTER — Encounter: Attending: Physical Medicine & Rehabilitation

## 2015-10-23 ENCOUNTER — Ambulatory Visit (HOSPITAL_BASED_OUTPATIENT_CLINIC_OR_DEPARTMENT_OTHER): Admitting: Physical Medicine & Rehabilitation

## 2015-10-23 ENCOUNTER — Encounter: Payer: Self-pay | Admitting: Physical Medicine & Rehabilitation

## 2015-10-23 VITALS — BP 130/83 | HR 75 | Resp 17

## 2015-10-23 DIAGNOSIS — M47816 Spondylosis without myelopathy or radiculopathy, lumbar region: Secondary | ICD-10-CM | POA: Insufficient documentation

## 2015-10-23 DIAGNOSIS — K219 Gastro-esophageal reflux disease without esophagitis: Secondary | ICD-10-CM | POA: Diagnosis not present

## 2015-10-23 DIAGNOSIS — M222X1 Patellofemoral disorders, right knee: Secondary | ICD-10-CM | POA: Diagnosis not present

## 2015-10-23 DIAGNOSIS — I10 Essential (primary) hypertension: Secondary | ICD-10-CM | POA: Insufficient documentation

## 2015-10-23 DIAGNOSIS — E785 Hyperlipidemia, unspecified: Secondary | ICD-10-CM | POA: Diagnosis not present

## 2015-10-23 DIAGNOSIS — K760 Fatty (change of) liver, not elsewhere classified: Secondary | ICD-10-CM | POA: Insufficient documentation

## 2015-10-23 DIAGNOSIS — M533 Sacrococcygeal disorders, not elsewhere classified: Secondary | ICD-10-CM | POA: Diagnosis not present

## 2015-10-23 DIAGNOSIS — G8929 Other chronic pain: Secondary | ICD-10-CM | POA: Insufficient documentation

## 2015-10-23 DIAGNOSIS — E119 Type 2 diabetes mellitus without complications: Secondary | ICD-10-CM | POA: Insufficient documentation

## 2015-10-23 DIAGNOSIS — G4733 Obstructive sleep apnea (adult) (pediatric): Secondary | ICD-10-CM | POA: Insufficient documentation

## 2015-10-23 DIAGNOSIS — F4310 Post-traumatic stress disorder, unspecified: Secondary | ICD-10-CM | POA: Diagnosis not present

## 2015-10-23 DIAGNOSIS — M545 Low back pain: Secondary | ICD-10-CM | POA: Diagnosis not present

## 2015-10-23 DIAGNOSIS — Z79899 Other long term (current) drug therapy: Secondary | ICD-10-CM | POA: Diagnosis not present

## 2015-10-23 MED ORDER — DIAZEPAM 10 MG PO TABS: ORAL_TABLET | ORAL | Status: DC

## 2015-10-23 NOTE — Telephone Encounter (Signed)
Valium called into pharmacy. Pt is aware. Also provided directions to office.

## 2015-10-23 NOTE — Progress Notes (Signed)
  PROCEDURE RECORD [facility]   Name: Kahlif Berkman. DOB:1965-11-13 MRN: [id number]  Date:10/23/2015  Physician: Alysia Penna, MD    Nurse/CMA: Mancel Parsons, CMA  Allergies:  Allergies  Allergen Reactions  . Oxycodone-Acetaminophen Hives and Itching    Consent Signed: Yes.    Is patient diabetic? Yes.    CBG today? N/A  Pregnant: No. LMP: No LMP for male patient. (age 49-55)  Anticoagulants: no Anti-inflammatory: no Antibiotics: no  Procedure: bilateral sacroiliac injection  Position: Prone Start Time: 11:44am  End Time: 11:51am  Fluoro Time: 23  RN/CMA [employer], CMA [employer], CMA    Time 1120 11:55am    BP 130/83 143/83    Pulse 76 75    Respirations 18 18    O2 Sat 95 95    S/S 6 6    Pain Level 3/10 2/10     D/C home with son, patient A & O X 3, D/C instructions reviewed, and sits independently.

## 2015-10-23 NOTE — Telephone Encounter (Signed)
Please advise 

## 2015-10-23 NOTE — Telephone Encounter (Signed)
Call in Valium 10 mg 1 tablet take prior to procedure

## 2015-10-23 NOTE — Patient Instructions (Signed)
Sacroiliac injection was performed today. A combination of a naming medicine plus a cortisone medicine was injected. The injection was done under x-ray guidance. This procedure has been performed to help reduce low back and buttocks pain as well as potentially hip pain. The duration of this injection is variable lasting from hours to  Months. It may repeated if needed. 

## 2015-10-23 NOTE — Progress Notes (Signed)

## 2015-10-23 NOTE — Telephone Encounter (Signed)
Patient wants to make sure the Valium was called into his pharmacy [facility] before his appointment today.

## 2015-10-26 MED FILL — SERTRALINE HCL 50 MG TABLET: 50 | 30 days supply | Qty: 30 | Fill #1

## 2015-10-29 ENCOUNTER — Encounter: Payer: Self-pay | Admitting: Family

## 2015-10-31 ENCOUNTER — Encounter: Payer: Self-pay | Admitting: Family

## 2015-10-31 MED ORDER — FLUCONAZOLE 150 MG PO TABS: ORAL_TABLET | ORAL | Status: DC

## 2015-10-31 NOTE — Addendum Note (Signed)
Addended by: Debbrah Alar on: 10/31/2015 05:24 PM   Modules accepted: Orders

## 2015-10-31 NOTE — Telephone Encounter (Signed)
Could you please leave letter at the front desk?

## 2015-11-01 NOTE — Telephone Encounter (Signed)
Letter placed at front desk and message sent to pt.

## 2015-11-09 ENCOUNTER — Telehealth: Payer: Self-pay | Admitting: *Deleted

## 2015-11-09 MED ORDER — EMPAGLIFLOZIN 10 MG PO TABS: 10.0000 mg | ORAL_TABLET | Freq: Every day | ORAL | 1 refills | Status: DC

## 2015-11-09 NOTE — Telephone Encounter (Signed)
Received fax from [facility] requesting new Rx for jardiance 10mg .  Refill sent.

## 2015-11-18 ENCOUNTER — Other Ambulatory Visit: Payer: Self-pay | Admitting: Family

## 2015-11-19 NOTE — Telephone Encounter (Signed)
Medication filled to pharmacy as requested.   

## 2015-11-22 ENCOUNTER — Encounter: Payer: Self-pay | Admitting: Family

## 2015-11-23 MED ORDER — FLUCONAZOLE 150 MG PO TABS: ORAL_TABLET | ORAL | 0 refills | Status: DC

## 2015-11-26 ENCOUNTER — Ambulatory Visit: Admitting: Skilled Nursing Facility1

## 2015-11-30 MED FILL — SERTRALINE HCL 50 MG TABLET: 50 | 30 days supply | Qty: 30 | Fill #2

## 2015-12-04 ENCOUNTER — Ambulatory Visit: Admitting: Physical Medicine & Rehabilitation

## 2015-12-04 ENCOUNTER — Encounter: Attending: Physical Medicine & Rehabilitation

## 2015-12-04 DIAGNOSIS — G8929 Other chronic pain: Secondary | ICD-10-CM | POA: Insufficient documentation

## 2015-12-04 DIAGNOSIS — F4310 Post-traumatic stress disorder, unspecified: Secondary | ICD-10-CM | POA: Insufficient documentation

## 2015-12-04 DIAGNOSIS — I10 Essential (primary) hypertension: Secondary | ICD-10-CM | POA: Insufficient documentation

## 2015-12-04 DIAGNOSIS — M47816 Spondylosis without myelopathy or radiculopathy, lumbar region: Secondary | ICD-10-CM | POA: Insufficient documentation

## 2015-12-04 DIAGNOSIS — E785 Hyperlipidemia, unspecified: Secondary | ICD-10-CM | POA: Insufficient documentation

## 2015-12-04 DIAGNOSIS — K219 Gastro-esophageal reflux disease without esophagitis: Secondary | ICD-10-CM | POA: Insufficient documentation

## 2015-12-04 DIAGNOSIS — Z79899 Other long term (current) drug therapy: Secondary | ICD-10-CM | POA: Insufficient documentation

## 2015-12-04 DIAGNOSIS — G4733 Obstructive sleep apnea (adult) (pediatric): Secondary | ICD-10-CM | POA: Insufficient documentation

## 2015-12-04 DIAGNOSIS — M222X1 Patellofemoral disorders, right knee: Secondary | ICD-10-CM | POA: Insufficient documentation

## 2015-12-04 DIAGNOSIS — K760 Fatty (change of) liver, not elsewhere classified: Secondary | ICD-10-CM | POA: Insufficient documentation

## 2015-12-04 DIAGNOSIS — E119 Type 2 diabetes mellitus without complications: Secondary | ICD-10-CM | POA: Insufficient documentation

## 2015-12-04 DIAGNOSIS — M545 Low back pain: Secondary | ICD-10-CM | POA: Insufficient documentation

## 2015-12-12 ENCOUNTER — Other Ambulatory Visit: Payer: Self-pay | Admitting: Family

## 2015-12-15 ENCOUNTER — Other Ambulatory Visit: Payer: Self-pay | Admitting: Family

## 2015-12-17 ENCOUNTER — Other Ambulatory Visit: Payer: Self-pay | Admitting: Internal Medicine

## 2015-12-18 NOTE — Telephone Encounter (Signed)
Rx(s) sent to pharmacy electronically.  

## 2015-12-25 ENCOUNTER — Other Ambulatory Visit: Payer: Self-pay | Admitting: Family

## 2015-12-26 NOTE — Telephone Encounter (Signed)
Pt has never read 12/12/15 mychart message regarding time for follow up with [name]. Mailed letter to pt.

## 2015-12-30 ENCOUNTER — Encounter: Payer: Self-pay | Admitting: Family

## 2015-12-31 ENCOUNTER — Encounter: Payer: Self-pay | Admitting: Physical Medicine & Rehabilitation

## 2015-12-31 NOTE — Telephone Encounter (Signed)
Pt has scheduled appt for 01/01/16 at 7:45am.

## 2016-01-01 ENCOUNTER — Encounter: Payer: Self-pay | Admitting: Family

## 2016-01-01 ENCOUNTER — Ambulatory Visit (HOSPITAL_BASED_OUTPATIENT_CLINIC_OR_DEPARTMENT_OTHER): Admitting: Physical Medicine & Rehabilitation

## 2016-01-01 ENCOUNTER — Ambulatory Visit (INDEPENDENT_AMBULATORY_CARE_PROVIDER_SITE_OTHER): Admitting: Family

## 2016-01-01 ENCOUNTER — Encounter: Attending: Physical Medicine & Rehabilitation

## 2016-01-01 ENCOUNTER — Encounter: Payer: Self-pay | Admitting: Physical Medicine & Rehabilitation

## 2016-01-01 VITALS — BP 128/82 | HR 67 | Resp 14

## 2016-01-01 VITALS — BP 140/88 | HR 68 | Temp 97.9°F | Resp 18 | Ht 72.0 in | Wt 339.0 lb

## 2016-01-01 DIAGNOSIS — K760 Fatty (change of) liver, not elsewhere classified: Secondary | ICD-10-CM | POA: Diagnosis not present

## 2016-01-01 DIAGNOSIS — M549 Dorsalgia, unspecified: Secondary | ICD-10-CM

## 2016-01-01 DIAGNOSIS — E785 Hyperlipidemia, unspecified: Secondary | ICD-10-CM | POA: Insufficient documentation

## 2016-01-01 DIAGNOSIS — N529 Male erectile dysfunction, unspecified: Secondary | ICD-10-CM

## 2016-01-01 DIAGNOSIS — M222X1 Patellofemoral disorders, right knee: Secondary | ICD-10-CM | POA: Insufficient documentation

## 2016-01-01 DIAGNOSIS — G8929 Other chronic pain: Secondary | ICD-10-CM

## 2016-01-01 DIAGNOSIS — M47816 Spondylosis without myelopathy or radiculopathy, lumbar region: Secondary | ICD-10-CM | POA: Insufficient documentation

## 2016-01-01 DIAGNOSIS — Z79899 Other long term (current) drug therapy: Secondary | ICD-10-CM | POA: Diagnosis not present

## 2016-01-01 DIAGNOSIS — G4733 Obstructive sleep apnea (adult) (pediatric): Secondary | ICD-10-CM | POA: Diagnosis not present

## 2016-01-01 DIAGNOSIS — F411 Generalized anxiety disorder: Secondary | ICD-10-CM

## 2016-01-01 DIAGNOSIS — M533 Sacrococcygeal disorders, not elsewhere classified: Secondary | ICD-10-CM

## 2016-01-01 DIAGNOSIS — F4310 Post-traumatic stress disorder, unspecified: Secondary | ICD-10-CM | POA: Insufficient documentation

## 2016-01-01 DIAGNOSIS — E119 Type 2 diabetes mellitus without complications: Secondary | ICD-10-CM | POA: Insufficient documentation

## 2016-01-01 DIAGNOSIS — Z23 Encounter for immunization: Secondary | ICD-10-CM | POA: Diagnosis not present

## 2016-01-01 DIAGNOSIS — M545 Low back pain: Secondary | ICD-10-CM | POA: Diagnosis not present

## 2016-01-01 DIAGNOSIS — K219 Gastro-esophageal reflux disease without esophagitis: Secondary | ICD-10-CM | POA: Diagnosis not present

## 2016-01-01 DIAGNOSIS — I10 Essential (primary) hypertension: Secondary | ICD-10-CM | POA: Insufficient documentation

## 2016-01-01 LAB — BASIC METABOLIC PANEL
BUN: 12 mg/dL (ref 6–23)
CALCIUM: 9.2 mg/dL (ref 8.4–10.5)
CO2: 29 meq/L (ref 19–32)
Chloride: 104 mEq/L (ref 96–112)
Creatinine, Ser: 0.9 mg/dL (ref 0.40–1.50)
GFR: 115.11 mL/min (ref >60.00)
Glucose, Bld: 108 mg/dL — ABNORMAL HIGH (ref 70–99)
Potassium: 3.6 mEq/L (ref 3.5–5.1)
SODIUM: 138 meq/L (ref 135–145)

## 2016-01-01 LAB — HEMOGLOBIN A1C: HEMOGLOBIN A1C: 6.6 % — AB (ref 4.6–6.5)

## 2016-01-01 NOTE — Assessment & Plan Note (Signed)
Stable on prn viagra.
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
## 2016-01-01 NOTE — Assessment & Plan Note (Signed)
Has not been checking sugars, continue current meds, obtain a1c.

## 2016-01-01 NOTE — Patient Instructions (Signed)
Please go to the lab. Work on diet, exercise and weight loss.
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
## 2016-01-01 NOTE — Assessment & Plan Note (Signed)
BP stable, continue current medications.

## 2016-01-01 NOTE — Assessment & Plan Note (Signed)
LDL at goal, continue statin. 
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
## 2016-01-01 NOTE — Assessment & Plan Note (Signed)
Stable on zoloft 

## 2016-01-01 NOTE — Patient Instructions (Signed)
Will do Sacroiliac injection first   Then do R L3-4-5 Radiofreq in Nov Left L3-4-5 RF in Dec

## 2016-01-11 ENCOUNTER — Encounter: Payer: Self-pay | Admitting: Family

## 2016-01-14 ENCOUNTER — Encounter: Payer: Self-pay | Admitting: Family

## 2016-01-14 ENCOUNTER — Ambulatory Visit (INDEPENDENT_AMBULATORY_CARE_PROVIDER_SITE_OTHER): Admitting: Family

## 2016-01-14 ENCOUNTER — Ambulatory Visit (HOSPITAL_BASED_OUTPATIENT_CLINIC_OR_DEPARTMENT_OTHER)
Admission: RE | Admit: 2016-01-14 | Discharge: 2016-01-14 | Disposition: A | Discharge status: Home / Self Care | Source: Ambulatory Visit | Attending: Family | Admitting: Family

## 2016-01-14 VITALS — BP 130/65 | HR 85 | Temp 98.5°F | Resp 16 | Ht 72.0 in | Wt 324.0 lb

## 2016-01-14 DIAGNOSIS — M7989 Other specified soft tissue disorders: Secondary | ICD-10-CM | POA: Diagnosis not present

## 2016-01-14 DIAGNOSIS — N481 Balanitis: Secondary | ICD-10-CM | POA: Diagnosis not present

## 2016-01-14 DIAGNOSIS — M25561 Pain in right knee: Secondary | ICD-10-CM

## 2016-01-14 DIAGNOSIS — M25571 Pain in right ankle and joints of right foot: Secondary | ICD-10-CM

## 2016-01-14 NOTE — Progress Notes (Signed)
Subjective:    Patient ID: Charles New., male    DOB: December 23, 1965, 50 y.o.   MRN: [id number]  HPI  Charles Nash is a 50 yr old male who presents today for follow up.    Penile rash- reports that clotrimazole.  Reports that it seems to occur about 2 days after intercourse. Reports improvement with the clotrimazole. Pt states that he has decided that he does not wish to continue having intercourse with is most recent partner.  Right Ankle pain- patient notes ankle pain on right medial ankle and right distal tibia which has been present for 2 months. He continues meloxicam nightly for his back. Reports that he had to walk with a limp.  He has not tried any other medication for the pain.  He denies injury to the right ankle.    Review of Systems    see HPI  Past Medical History:  Diagnosis Date  . ADHD (attention deficit hyperactivity disorder)   . Allergy   . Arthritis   . Bilateral varicoceles   . Diabetes mellitus   . Fatty liver 08/06/2011  . Genital herpes 10/25/2014  . GERD (gastroesophageal reflux disease)   . Hyperlipidemia   . Hypertension   . OSA (obstructive sleep apnea)   . Personal history of colonic polyps   . Plantar fasciitis   . PTSD (post-traumatic stress disorder)      Social History   Social History  . Marital status: Married    Spouse name: N/A  . Number of children: 2  . Years of education: N/A   Occupational History  . [employer]   Social History Main Topics  . Smoking status: Never Smoker  . Smokeless tobacco: Never Used  . Alcohol use No     Comment: occasional wine  . Drug use: No  . Sexual activity: Not on file   Other Topics Concern  . Not on file   Social History Narrative   Regular exercise:  No   Caffeine use:  2--32oz cups daily          Past Surgical History:  Procedure Laterality Date  . ANKLE SURGERY    . DOPPLER ECHOCARDIOGRAPHY  2010  . FOOT SURGERY    . KNEE SURGERY  08/04/08   Right knee-- medial meniscus repair, attenuation anterior cruciate [name] MD [facility])   . NM MYOVIEW LTD  2010  . PLANTAR FASCIA RELEASE    . SHOULDER SURGERY    . VARICOCELE EXCISION      Family History  Problem Relation Age of Onset  . Diabetes Mother   . Hypertension Mother   . Arthritis Mother   . Cancer Mother     uterine and breast cancer  . Hyperlipidemia Father   . Arthritis Father   . Cancer Father     thyroid, prostate cancer  . Other Father     mass in stomach  . Hyperlipidemia Maternal Grandmother   . Hypertension Maternal Grandmother   . Hyperlipidemia Maternal Grandfather   . Hypertension Maternal Grandfather   . Hyperlipidemia Paternal Grandmother   . Hypertension Paternal Grandmother   . Hyperlipidemia Paternal Grandfather   . Hypertension Paternal Grandfather   . Heart attack Paternal Grandfather   . Sudden death Neg Hx   . Colon cancer Neg Hx   . Esophageal cancer Neg Hx   . Stomach cancer Neg Hx   . Rectal cancer Neg Hx     Allergies  Allergen Reactions  . Oxycodone-Acetaminophen Hives and Itching    Current Outpatient Prescriptions on File Prior to Visit  Medication Sig Dispense Refill  . aspirin 81 MG tablet Take 1 tablet (81 mg total) by mouth daily. 90 tablet 1  . atorvastatin (LIPITOR) 10 MG tablet TAKE 1 TABLET DAILY 90 tablet 0  . Blood Glucose Monitoring Suppl (FREESTYLE LITE) DEVI Use to check blood sugar once a day.  E11.9 1 each 0  . cetirizine (ZYRTEC) 10 MG tablet Take 1 tablet (10 mg total) by mouth daily. 30 tablet 5  . clomiPHENE (CLOMID) 50 MG tablet Take 0.5 tablets by mouth daily.    . diphenhydrAMINE (BENADRYL) 25 mg capsule Take 25-50 mg by mouth at bedtime as needed.    . empagliflozin (JARDIANCE) 10 MG TABS tablet Take 10 mg by mouth daily. 90 tablet 1  . fluticasone (FLONASE) 50 MCG/ACT nasal spray Place 2 sprays into both nostrils daily. 16 g 5  . FREESTYLE LITE test strip USE AS INSTRUCTED TO CHECK  BLOOD SUGAR ONCE DAILY 100 each 1  . hydrALAZINE (APRESOLINE) 50 MG tablet TAKE 1 TABLET DAILY 90 tablet 1  . KLOR-CON M20 20 MEQ tablet TAKE 1 TABLET DAILY 90 tablet 0  . Lancets (FREESTYLE) lancets USE TO CHECK BLOOD SUGAR ONCE DAILY 100 each 1  . meloxicam (MOBIC) 7.5 MG tablet TAKE 1 TABLET DAILY 90 tablet 0  . metoprolol succinate (TOPROL XL) 50 MG 24 hr tablet Take 2 tablets (100 mg total) by mouth daily. PLEASE CONTACT OFFICE FOR ADDITIONAL REFILLS 2ND ATTEMPT 135 tablet 0  . omeprazole (PRILOSEC) 20 MG capsule Take 2 capsules (40 mg total) by mouth every morning. 28 capsule 0  . OVER THE COUNTER MEDICATION MAXIMIZE LIVING.  Maxfit.  Take 1 tablet daily.    Marland Kitchen OVER THE COUNTER MEDICATION OPTIMAL LIVING fish oil.  Take 2 capsules daily.    . pioglitazone (ACTOS) 30 MG tablet TAKE 1 TABLET DAILY 90 tablet 1  . Pyridoxine HCl (B-6 PO) Take 1 tablet by mouth daily.    . sertraline (ZOLOFT) 50 MG tablet 1 tab by mouth once daily 30 tablet 3  . sildenafil (VIAGRA) 100 MG tablet Take 1/2 to 1 tablet daily as needed 15 tablet 0  . TRIBENZOR 40-10-25 MG TABS TAKE 1 TABLET DAILY 90 tablet 1  . zolpidem (AMBIEN) 10 MG tablet Take 1 tablet (10 mg total) by mouth daily. 90 tablet 0  . [DISCONTINUED] potassium chloride (K-DUR) 10 MEQ tablet Take 1 tablet (10 mEq total) by mouth daily. 30 tablet 1   No current facility-administered medications on file prior to visit.     BP 130/65 (BP Location: Right Arm, Cuff Size: Large)   Pulse 85   Temp 98.5 F (36.9 C) (Oral)   Resp 16   Ht 6' (1.829 m)   Wt (!) 324 lb (147 kg)   SpO2 99% Comment: room air  BMI 43.94 kg/m    Objective:   Physical Exam  Constitutional: He is oriented to person, place, and time. He appears well-developed and well-nourished. No distress.  HENT:  Head: Normocephalic and atraumatic.  Eyes: No scleral icterus.  Pulmonary/Chest: Breath sounds normal. No respiratory distress. He has no wheezes. He has no rales.    Genitourinary:  Genitourinary Comments: No penile, rash or lesions  Musculoskeletal: He exhibits no edema.  Neurological: He is alert and oriented to person, place, and time.  Skin: Skin is warm.  Psychiatric: He has a normal  mood and affect. His behavior is normal. Thought content normal.          Assessment & Plan:  Chaperone present for exam.   balanitis- resolved. Suspect that partner had yeast infection and was re-infecting patient. Advised pt as follows:   Continue to apply the clotrimazole cream twice daily for a few more days then as needed.   Right ankle/tibial pain- will obtain x rays for further evaluation.

## 2016-01-14 NOTE — Progress Notes (Signed)
Pre visit review using our clinic review tool, if applicable. No additional management support is needed unless otherwise documented below in the visit note. 

## 2016-01-14 NOTE — Patient Instructions (Signed)
Please complete lab x rays on the first floor. For pain, you may begin meloxicam once daily as needed. Continue to apply the clotrimazole cream twice daily for a few more days then as needed.

## 2016-01-15 ENCOUNTER — Encounter: Attending: Physical Medicine & Rehabilitation

## 2016-01-15 ENCOUNTER — Encounter: Payer: Self-pay | Admitting: Physical Medicine & Rehabilitation

## 2016-01-15 ENCOUNTER — Ambulatory Visit (HOSPITAL_BASED_OUTPATIENT_CLINIC_OR_DEPARTMENT_OTHER): Admitting: Physical Medicine & Rehabilitation

## 2016-01-15 VITALS — BP 134/81 | HR 86 | Resp 14

## 2016-01-15 DIAGNOSIS — M222X1 Patellofemoral disorders, right knee: Secondary | ICD-10-CM | POA: Diagnosis not present

## 2016-01-15 DIAGNOSIS — M533 Sacrococcygeal disorders, not elsewhere classified: Secondary | ICD-10-CM | POA: Diagnosis not present

## 2016-01-15 DIAGNOSIS — E785 Hyperlipidemia, unspecified: Secondary | ICD-10-CM | POA: Diagnosis not present

## 2016-01-15 DIAGNOSIS — M47816 Spondylosis without myelopathy or radiculopathy, lumbar region: Secondary | ICD-10-CM | POA: Insufficient documentation

## 2016-01-15 DIAGNOSIS — G4733 Obstructive sleep apnea (adult) (pediatric): Secondary | ICD-10-CM | POA: Insufficient documentation

## 2016-01-15 DIAGNOSIS — E119 Type 2 diabetes mellitus without complications: Secondary | ICD-10-CM | POA: Insufficient documentation

## 2016-01-15 DIAGNOSIS — K760 Fatty (change of) liver, not elsewhere classified: Secondary | ICD-10-CM | POA: Diagnosis not present

## 2016-01-15 DIAGNOSIS — K219 Gastro-esophageal reflux disease without esophagitis: Secondary | ICD-10-CM | POA: Diagnosis not present

## 2016-01-15 DIAGNOSIS — F4310 Post-traumatic stress disorder, unspecified: Secondary | ICD-10-CM | POA: Insufficient documentation

## 2016-01-15 DIAGNOSIS — I10 Essential (primary) hypertension: Secondary | ICD-10-CM | POA: Diagnosis not present

## 2016-01-15 DIAGNOSIS — G8929 Other chronic pain: Secondary | ICD-10-CM | POA: Diagnosis present

## 2016-01-15 DIAGNOSIS — M545 Low back pain: Secondary | ICD-10-CM | POA: Diagnosis present

## 2016-01-15 DIAGNOSIS — Z79899 Other long term (current) drug therapy: Secondary | ICD-10-CM | POA: Insufficient documentation

## 2016-01-15 MED ORDER — DIAZEPAM 10 MG PO TABS: 10.0000 mg | ORAL_TABLET | Freq: Four times a day (QID) | ORAL | 0 refills | Status: DC | PRN

## 2016-01-15 NOTE — Progress Notes (Signed)
Bilateral sacroiliac injections under fluoroscopic guidance  Indication: Low back and buttocks pain not relieved by medication management and other conservative care.  Informed consent was obtained after describing risks and benefits of the procedure with the patient, this includes bleeding, bruising, infection, paralysis and medication side effects. The patient wishes to proceed and has given written consent. The patient was placed in a prone position. The lumbar and sacral area was marked and prepped with Betadine. A 25-gauge 1-1/2 inch needle was inserted into the skin and subcutaneous tissue and 1 mL of 1% lidocaine was injected into each side. Then a 25-gauge 3.5 inch spinal needle was inserted under fluoroscopic guidance into the left sacroiliac joint. AP and lateral images were utilized. Omnipaque 180x0.5 mL under live fluoroscopy demonstrated no intravascular uptake. Then a solution containing one ML of 6 mg per mL Celestone in 2 ML of 2% lidocaine MPF was injected x1.5 mL. This same procedure was repeated on the right side using the same needle, injectate, and technique. Patient tolerated the procedure well. Post procedure instructions were given. Please see post procedure form. 

## 2016-01-15 NOTE — Progress Notes (Signed)
  PROCEDURE RECORD [facility]   Name: Charles Nash DOB:07/17/1965 MRN: [id number]  Date:01/15/2016  Physician: Alysia Penna, MD    Nurse/CMA: Ginna Schuur, CMA  Allergies:  Allergies  Allergen Reactions  . Oxycodone-Acetaminophen Hives and Itching    Consent Signed: Yes.    Is patient diabetic? Yes.    CBG today? 90  Pregnant: No. LMP: No LMP for male patient. (age 49-55)  Anticoagulants: no Anti-inflammatory: no Antibiotics: no  Procedure: bilateral sacroiliac steroid injection Position: Prone Start Time: 10:50am  End Time: 10:56am  Fluoro Time: 20   RN/CMA Jasiyah Paulding, CMA Gee Habig, CMA    Time 10:20 am 11:00pm    BP 134/81 130/78    Pulse 86 80    Respirations 14 14    O2 Sat 96 96    S/S 6 6    Pain Level 4/10 2/10     D/C home with mother in law, patient A & O X 3, D/C instructions reviewed, and sits independently.

## 2016-01-15 NOTE — Patient Instructions (Signed)
Sacroiliac injection was performed today. A combination of a naming medicine plus a cortisone medicine was injected. The injection was done under x-ray guidance. This procedure has been performed to help reduce low back and buttocks pain as well as potentially hip pain. The duration of this injection is variable lasting from hours to  Months. It may repeated if needed. 

## 2016-01-21 ENCOUNTER — Encounter: Payer: Self-pay | Admitting: Family

## 2016-01-21 ENCOUNTER — Other Ambulatory Visit: Payer: Self-pay | Admitting: Family

## 2016-01-21 MED ORDER — SILDENAFIL CITRATE 100 MG PO TABS: ORAL_TABLET | ORAL | 0 refills | Status: DC

## 2016-01-21 MED ORDER — POTASSIUM CHLORIDE CRYS ER 20 MEQ PO TBCR: 20.0000 meq | EXTENDED_RELEASE_TABLET | Freq: Every day | ORAL | 1 refills | Status: DC

## 2016-01-23 ENCOUNTER — Encounter: Payer: Self-pay | Admitting: Family

## 2016-01-23 ENCOUNTER — Ambulatory Visit (INDEPENDENT_AMBULATORY_CARE_PROVIDER_SITE_OTHER): Admitting: Family

## 2016-01-23 MED ORDER — INSULIN PEN NEEDLE 32G X 6 MM MISC: 1 refills | Status: DC

## 2016-01-23 MED ORDER — LIRAGLUTIDE -WEIGHT MANAGEMENT 18 MG/3ML ~~LOC~~ SOPN: 0.6000 mg | PEN_INJECTOR | Freq: Every day | SUBCUTANEOUS | 0 refills | Status: DC

## 2016-01-23 NOTE — Progress Notes (Signed)
Pre visit review using our clinic review tool, if applicable. No additional management support is needed unless otherwise documented below in the visit note. 

## 2016-01-23 NOTE — Patient Instructions (Addendum)
Add walking 30 minutes 5 days a week. Don't skip meals. Make sure you have a protein with each person.   Stop jardiance, start saxenda.  Week 1 inject 0.6mg  once daily Week 2 inject 1.2 mg once daily Week 3 inject 1.8 mg once daily Week 4 inject 2.4 mg once daily Week 5 inject 3 mg once daily   Check sugars regularly. Let us know if you experience sugars <80.

## 2016-01-23 NOTE — Progress Notes (Signed)
Subjective:    Patient ID: Charles Nash, male    DOB: 20-Sep-1965, 50 y.o.   MRN: [id number]  HPI  Charles Nash is a 50 yr old male who presents today to discuss obesity and weight loss options. He does report that if he skips meals he gets shakey and sweaty and "craves sugar."    Wt Readings from Last 3 Encounters:  01/23/16 (!) 343 lb (155.6 kg)  01/14/16 (!) 324 lb (147 kg)  01/01/16 (!) 339 lb (153.8 kg)   He reports + fatigue.  He is not exercising.  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   Lab Results  Component Value Date   HGBA1C 6.6 (H) 01/01/2016    Review of Systems See HPI  Past Medical History:  Diagnosis Date  . ADHD (attention deficit hyperactivity disorder)   . Allergy   . Arthritis   . Bilateral varicoceles   . Diabetes mellitus   . Fatty liver 08/06/2011  . Genital herpes 10/25/2014  . GERD (gastroesophageal reflux disease)   . Hyperlipidemia   . Hypertension   . OSA (obstructive sleep apnea)   . Personal history of colonic polyps   . Plantar fasciitis   . PTSD (post-traumatic stress disorder)      Social History   Social History  . Marital status: Married    Spouse name: N/A  . Number of children: 2  . Years of education: N/A   Occupational History  . [employer]   Social History Main Topics  . Smoking status: Never Smoker  . Smokeless tobacco: Never Used  . Alcohol use No     Comment: occasional wine  . Drug use: No  . Sexual activity: Not on file   Other Topics Concern  . Not on file   Social History Narrative   Regular exercise:  No   Caffeine use:  2--32oz cups daily          Past Surgical History:  Procedure Laterality Date  . ANKLE SURGERY    . DOPPLER ECHOCARDIOGRAPHY  2010  . FOOT SURGERY    . KNEE SURGERY  08/04/08   Right knee-- medial meniscus repair, attenuation anterior cruciate [name] MD [facility])   . NM MYOVIEW LTD  2010  . PLANTAR FASCIA RELEASE    . SHOULDER  SURGERY    . VARICOCELE EXCISION      Family History  Problem Relation Age of Onset  . Diabetes Mother   . Hypertension Mother   . Arthritis Mother   . Cancer Mother     uterine and breast cancer  . Hyperlipidemia Father   . Arthritis Father   . Cancer Father     thyroid, prostate cancer  . Other Father     mass in stomach  . Hyperlipidemia Maternal Grandmother   . Hypertension Maternal Grandmother   . Hyperlipidemia Maternal Grandfather   . Hypertension Maternal Grandfather   . Hyperlipidemia Paternal Grandmother   . Hypertension Paternal Grandmother   . Hyperlipidemia Paternal Grandfather   . Hypertension Paternal Grandfather   . Heart attack Paternal Grandfather   . Sudden death Neg Hx   . Colon cancer Neg Hx   . Esophageal cancer Neg Hx   . Stomach cancer Neg Hx   . Rectal cancer Neg Hx     Allergies  Allergen Reactions  . Oxycodone-Acetaminophen Hives and Itching  . Zoloft [Sertraline Hcl]     ?weight gain. "made me feel  weird"    Current Outpatient Prescriptions on File Prior to Visit  Medication Sig Dispense Refill  . aspirin 81 MG tablet Take 1 tablet (81 mg total) by mouth daily. 90 tablet 1  . atorvastatin (LIPITOR) 10 MG tablet TAKE 1 TABLET DAILY 90 tablet 0  . Blood Glucose Monitoring Suppl (FREESTYLE LITE) DEVI Use to check blood sugar once a day.  E11.9 1 each 0  . cetirizine (ZYRTEC) 10 MG tablet Take 1 tablet (10 mg total) by mouth daily. 30 tablet 5  . clomiPHENE (CLOMID) 50 MG tablet Take 0.5 tablets by mouth daily.    . diazepam (VALIUM) 10 MG tablet Take 1 tablet (10 mg total) by mouth every 6 (six) hours as needed for anxiety. 2 tablet 0  . diphenhydrAMINE (BENADRYL) 25 mg capsule Take 25-50 mg by mouth at bedtime as needed.    . empagliflozin (JARDIANCE) 10 MG TABS tablet Take 10 mg by mouth daily. 90 tablet 1  . fluticasone (FLONASE) 50 MCG/ACT nasal spray Place 2 sprays into both nostrils daily. 16 g 5  . FREESTYLE LITE test strip USE AS  INSTRUCTED TO CHECK BLOOD SUGAR ONCE DAILY 100 each 1  . hydrALAZINE (APRESOLINE) 50 MG tablet TAKE 1 TABLET DAILY 90 tablet 1  . Lancets (FREESTYLE) lancets USE TO CHECK BLOOD SUGAR ONCE DAILY 100 each 1  . meloxicam (MOBIC) 7.5 MG tablet TAKE 1 TABLET DAILY 90 tablet 0  . metoprolol succinate (TOPROL XL) 50 MG 24 hr tablet Take 2 tablets (100 mg total) by mouth daily. PLEASE CONTACT OFFICE FOR ADDITIONAL REFILLS 2ND ATTEMPT 135 tablet 0  . omeprazole (PRILOSEC) 20 MG capsule Take 2 capsules (40 mg total) by mouth every morning. 28 capsule 0  . OVER THE COUNTER MEDICATION MAXIMIZE LIVING.  Maxfit.  Take 1 tablet daily.    Marland Kitchen OVER THE COUNTER MEDICATION OPTIMAL LIVING fish oil.  Take 2 capsules daily.    . pioglitazone (ACTOS) 30 MG tablet TAKE 1 TABLET DAILY 90 tablet 1  . potassium chloride SA (KLOR-CON M20) 20 MEQ tablet Take 1 tablet (20 mEq total) by mouth daily. 90 tablet 1  . Pyridoxine HCl (B-6 PO) Take 1 tablet by mouth daily.    . sildenafil (VIAGRA) 100 MG tablet Take 1/2 to 1 tablet daily as needed 15 tablet 0  . TRIBENZOR 40-10-25 MG TABS TAKE 1 TABLET DAILY 90 tablet 1  . zolpidem (AMBIEN) 10 MG tablet Take 1 tablet (10 mg total) by mouth daily. 90 tablet 0  . [DISCONTINUED] potassium chloride (K-DUR) 10 MEQ tablet Take 1 tablet (10 mEq total) by mouth daily. 30 tablet 1   No current facility-administered medications on file prior to visit.     BP 125/65 (BP Location: Right Arm, Cuff Size: Large)   Pulse 79   Temp 98.1 F (36.7 C) (Oral)   Resp 18   Ht 6' (1.829 m)   Wt (!) 343 lb (155.6 kg)   SpO2 98% Comment: room air  BMI 46.52 kg/m       Objective:   Physical Exam  Constitutional: He is oriented to person, place, and time. He appears well-developed and well-nourished. No distress.  Neurological: He is alert and oriented to person, place, and time.  Psychiatric: He has a normal mood and affect. His behavior is normal. Judgment and thought content normal.           Assessment & Plan:  Morbid Obesity- d/c jardiance, begin saxenda- teaching in office today. Monitor  sugars regularly, advised patient to call if sugars <80 after beginning saxenda. We dis discuss diet and exercise today.  15 min spent with pt today. >50% of this time was spent counseling patient on diet, exercise, weight loss and DM management.

## 2016-01-25 ENCOUNTER — Telehealth: Payer: Self-pay | Admitting: *Deleted

## 2016-01-25 NOTE — Telephone Encounter (Signed)
Received fax from [place] requesting verification of Saxenda directions and quantity. Gave directions per OV as below.  States each box contains 5 pens for total of 3 mLs and will need to dispense 2 boxes (22mL).  Week 1 inject 0.6mg  once daily Week 2 inject 1.2 mg once daily Week 3 inject 1.8 mg once daily Week 4 inject 2.4 mg once daily Week 5 inject 3 mg once daily

## 2016-02-02 ENCOUNTER — Other Ambulatory Visit: Payer: Self-pay | Admitting: Family

## 2016-02-07 ENCOUNTER — Encounter: Payer: Self-pay | Admitting: Family

## 2016-02-08 NOTE — Telephone Encounter (Signed)
Caller name: Fowler Guzzardo Relationship to patient: self Can be reached: [phone]  Reason for call: Pt called stating he is w/o Saxenda. He took last one yesterday. He said RX was sent to [facility] but needs PA and it hasn't been approved yet. Pt asking if he can come in today to p/u sample. Please call pt to notify.

## 2016-02-11 ENCOUNTER — Ambulatory Visit (INDEPENDENT_AMBULATORY_CARE_PROVIDER_SITE_OTHER): Admitting: Orthopaedic Surgery

## 2016-02-11 ENCOUNTER — Encounter: Payer: Self-pay | Admitting: Family

## 2016-02-11 ENCOUNTER — Encounter (INDEPENDENT_AMBULATORY_CARE_PROVIDER_SITE_OTHER): Payer: Self-pay | Admitting: Orthopaedic Surgery

## 2016-02-11 DIAGNOSIS — M25571 Pain in right ankle and joints of right foot: Secondary | ICD-10-CM | POA: Diagnosis not present

## 2016-02-11 MED ORDER — METHYLPREDNISOLONE ACETATE 40 MG/ML IJ SUSP
40.0000 mg | INTRAMUSCULAR | Status: AC | PRN
  Administered 2016-02-11: 40 mg via INTRA_ARTICULAR

## 2016-02-11 MED ORDER — LIDOCAINE HCL 1 % IJ SOLN
2.0000 mL | INTRAMUSCULAR | Status: AC | PRN
  Administered 2016-02-11: 2 mL

## 2016-02-11 NOTE — Progress Notes (Signed)
Office Visit Note   Patient: Charles Nash           Date of Birth: [birth year]           MRN: [id number] Visit Date: 02/11/2016              Requested by: Debbrah Alar, NP [address] [place], [place] [address] PCP: Nance Pear., NP   Assessment & Plan: Visit Diagnoses:  1. Pain in right ankle and joints of right foot     Plan: We will see how he does with his right ankle steroid injection as well as a ankle brace. He'll continue his anti-inflammatory medications and I will see him back in a month to see how he is done with his right ankle steroid injection.  Follow-Up Instructions: Return in about 4 weeks (around 03/10/2016).   Orders:  Orders Placed This Encounter  Procedures  . Medium Joint Injection/Arthrocentesis   No orders of the defined types were placed in this encounter.     Procedures: Medium Joint Inj Date/Time: 02/11/2016 2:47 PM Performed by: Mcarthur Rossetti Authorized by: Mcarthur Rossetti   Location:  Ankle Needle Size:  22 G Ultrasound Guided: No   Fluoroscopic Guidance: No   Medications:  2 mL lidocaine 1 %; 40 mg methylPREDNISolone acetate 40 MG/ML Aspiration Attempted: No       Clinical Data: No additional findings.   Subjective: Chief Complaint  Patient presents with  . Right Ankle - Pain    Right ankle pain x5 months off/on, becoming more constant. No known recent injury (maybe in [employer]) xrays on canopy.    HPI  Review of Systems   Objective: Vital Signs: There were no vitals taken for this visit.  Physical Exam  Ortho Exam His right ankle shows full range of motion. He can perform a toe raise without any difficulties. He does have previous scarring around the ankle from surgeries that he has had in the past. He is neurovascularly intact with good strength. He points over the anterior ankle as a source of his pain and this may be an impingement type of syndrome for the  ankle. Specialty Comments:  No specialty comments available.  Imaging: No results found. There are x-rays only cone system that shows no acute bony abnormalities of his right ankle.  PMFS History: Patient Active Problem List   Diagnosis Date Noted  . Sacroiliac joint dysfunction of both sides 01/01/2016  . Anxiety state 08/20/2015  . 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 Patellofemoral disorder 05/15/2015  . Chronic back pain 05/15/2015  . Genital herpes 10/25/2014  . Depression 10/05/2014  . Osteoarthritis of left knee 08/15/2014  . 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 Sciatica 04/22/2014  . Osteoarthritis of right knee 02/06/2014  . Allergy to mold 12/13/2013  . Weight gain, abnormal 11/08/2013  . PTSD (post-traumatic stress disorder) 07/05/2013  . Multinodular goiter 01/19/2013  . GERD (gastroesophageal reflux disease) 08/09/2012  . Neck pain 11/12/2011  . Fatty liver 08/06/2011  . Cervical disc disease 07/28/2011  . Preventative health care 07/28/2011  . Erectile dysfunction 05/23/2011  . Elevated liver function tests 05/20/2011  . DM2 (diabetes mellitus, type 2) ([place]) 05/19/2011  . HTN (hypertension) 05/19/2011  . Hyperlipidemia 05/19/2011  . Morbid obesity ([place]) 05/19/2011  . Allergic rhinitis 05/19/2011  . OSA (obstructive sleep apnea) 05/19/2011   Past Medical History:  Diagnosis Date  . ADHD (attention deficit hyperactivity disorder)   .  Allergy   . Arthritis   . Bilateral varicoceles   . Diabetes mellitus   . Fatty liver 08/06/2011  . Genital herpes 10/25/2014  . GERD (gastroesophageal reflux disease)   . Hyperlipidemia   . Hypertension   . OSA (obstructive sleep apnea)   . Personal history of colonic polyps   . Plantar fasciitis   . PTSD (post-traumatic stress disorder)     Family History  Problem Relation Age of Onset  . Diabetes Mother   . Hypertension Mother   . Arthritis Mother   . Cancer Mother     uterine  and breast cancer  . Hyperlipidemia Father   . Arthritis Father   . Cancer Father     thyroid, prostate cancer  . Other Father     mass in stomach  . Hyperlipidemia Maternal Grandmother   . Hypertension Maternal Grandmother   . Hyperlipidemia Maternal Grandfather   . Hypertension Maternal Grandfather   . Hyperlipidemia Paternal Grandmother   . Hypertension Paternal Grandmother   . Hyperlipidemia Paternal Grandfather   . Hypertension Paternal Grandfather   . Heart attack Paternal Grandfather   . Sudden death Neg Hx   . Colon cancer Neg Hx   . Esophageal cancer Neg Hx   . Stomach cancer Neg Hx   . Rectal cancer Neg Hx     Past Surgical History:  Procedure Laterality Date  . ANKLE SURGERY    . DOPPLER ECHOCARDIOGRAPHY  2010  . FOOT SURGERY    . KNEE SURGERY  08/04/08   Right knee-- medial meniscus repair, attenuation anterior cruciate [name] [facility])   . NM MYOVIEW LTD  2010  . PLANTAR FASCIA RELEASE    . SHOULDER SURGERY    . VARICOCELE EXCISION     Social History   Occupational History  . [employer]   Social History Main Topics  . Smoking status: Never Smoker  . Smokeless tobacco: Never Used  . Alcohol use No     Comment: occasional wine  . Drug use: No  . Sexual activity: Not on file

## 2016-02-11 NOTE — Telephone Encounter (Signed)
Please see 02/11/16 phone note.

## 2016-02-12 ENCOUNTER — Telehealth: Payer: Self-pay | Admitting: *Deleted

## 2016-02-12 ENCOUNTER — Ambulatory Visit: Admitting: Physical Medicine & Rehabilitation

## 2016-02-12 ENCOUNTER — Encounter

## 2016-02-12 NOTE — Telephone Encounter (Signed)
D/c saxenda, can try victoza.

## 2016-02-12 NOTE — Telephone Encounter (Signed)
Spoke with Charles Nash at [facility] and was told that Charles Nash is non formulary and is not available for formulary exception. Formulary alternatives would be:  Byetta, bydureon or Victoza but all 3 will require prior authorization.  Please advise if one of these would be appropriate alternative?

## 2016-02-12 NOTE — Telephone Encounter (Signed)
Message sent to pt via mychart. Awaiting to see which pharmacy pt wants to use.

## 2016-02-12 NOTE — Telephone Encounter (Signed)
See 02/12/16 phone note re: Kirke Shaggy PA.

## 2016-02-13 MED ORDER — LIRAGLUTIDE 18 MG/3ML ~~LOC~~ SOPN: 1.8000 mg | PEN_INJECTOR | Freq: Every day | SUBCUTANEOUS | 1 refills | Status: DC

## 2016-02-13 NOTE — Telephone Encounter (Signed)
Received fax from [facility] to call [phone] for PA. Called and was told that PA must be completed online but info was not in covermymeds. CSR entered information and form now available. Completed PA via covermymeds. Request sent for further review and may take up to 72 hours for determination.  Pt has tried metformin, [place]. Currently taking actos and jardiance. One question asked if pt had tried and failed bydureon. Pt has not been on medication yet. Awaiting response.

## 2016-02-13 NOTE — Telephone Encounter (Signed)
Victoza Rx sent to [facility]. Awaiting prior auth notice.

## 2016-02-15 NOTE — Telephone Encounter (Signed)
PA for Victoza denied. Awaiting letter detailing reason or other alternatives.

## 2016-02-18 NOTE — Telephone Encounter (Signed)
Angie-- Please let me know if you see this denial letter come through. Thanks!

## 2016-02-19 ENCOUNTER — Other Ambulatory Visit: Payer: Self-pay | Admitting: Family

## 2016-02-19 NOTE — Telephone Encounter (Signed)
Medication filled to pharmacy as requested.   

## 2016-02-20 NOTE — Telephone Encounter (Signed)
My chart message sent to pt.

## 2016-02-20 NOTE — Telephone Encounter (Signed)
[name]-- Saxenda and Victoza have both been denied. Please advise if there are any other alternatives?

## 2016-02-20 NOTE — Telephone Encounter (Signed)
Please ask him to contact his insurance to see if Melynda Keller is covered.

## 2016-02-25 ENCOUNTER — Encounter: Payer: Self-pay | Admitting: Physical Medicine & Rehabilitation

## 2016-02-25 ENCOUNTER — Ambulatory Visit (HOSPITAL_BASED_OUTPATIENT_CLINIC_OR_DEPARTMENT_OTHER): Admitting: Physical Medicine & Rehabilitation

## 2016-02-25 ENCOUNTER — Encounter: Attending: Physical Medicine & Rehabilitation

## 2016-02-25 VITALS — BP 128/81 | HR 92 | Resp 14

## 2016-02-25 DIAGNOSIS — I10 Essential (primary) hypertension: Secondary | ICD-10-CM | POA: Diagnosis not present

## 2016-02-25 DIAGNOSIS — M47816 Spondylosis without myelopathy or radiculopathy, lumbar region: Secondary | ICD-10-CM | POA: Diagnosis not present

## 2016-02-25 DIAGNOSIS — M222X1 Patellofemoral disorders, right knee: Secondary | ICD-10-CM | POA: Diagnosis not present

## 2016-02-25 DIAGNOSIS — K760 Fatty (change of) liver, not elsewhere classified: Secondary | ICD-10-CM | POA: Diagnosis not present

## 2016-02-25 DIAGNOSIS — G8929 Other chronic pain: Secondary | ICD-10-CM | POA: Diagnosis present

## 2016-02-25 DIAGNOSIS — Z79899 Other long term (current) drug therapy: Secondary | ICD-10-CM | POA: Insufficient documentation

## 2016-02-25 DIAGNOSIS — G4733 Obstructive sleep apnea (adult) (pediatric): Secondary | ICD-10-CM | POA: Insufficient documentation

## 2016-02-25 DIAGNOSIS — E119 Type 2 diabetes mellitus without complications: Secondary | ICD-10-CM | POA: Insufficient documentation

## 2016-02-25 DIAGNOSIS — E785 Hyperlipidemia, unspecified: Secondary | ICD-10-CM | POA: Diagnosis not present

## 2016-02-25 DIAGNOSIS — M545 Low back pain: Secondary | ICD-10-CM | POA: Diagnosis present

## 2016-02-25 DIAGNOSIS — F4310 Post-traumatic stress disorder, unspecified: Secondary | ICD-10-CM | POA: Diagnosis not present

## 2016-02-25 DIAGNOSIS — K219 Gastro-esophageal reflux disease without esophagitis: Secondary | ICD-10-CM | POA: Insufficient documentation

## 2016-02-25 MED ORDER — GABAPENTIN 600 MG PO TABS: 600.0000 mg | ORAL_TABLET | Freq: Three times a day (TID) | ORAL | 1 refills | Status: DC

## 2016-02-25 NOTE — Telephone Encounter (Signed)
I would not recommend bydureon. If he would like a referral to nutrition for weight loss I am happy to place.

## 2016-02-25 NOTE — Telephone Encounter (Signed)
Received letter from [place] that victoza has been denied and is provided in situations where the patient has had an inadequate response with Bydureon.  Please advise if this would be an acceptable alternative?

## 2016-02-25 NOTE — Patient Instructions (Signed)

## 2016-02-25 NOTE — Progress Notes (Signed)
RightL5 dorsal ramus., Right L4 and Right L3 medial branch radio frequency neurotomy under fluoroscopic guidance   Indication: Low back pain due to lumbar spondylosis which has been relieved on 2 occasions by greater than 50% by lumbar medial branch blocks at corresponding levels.  Informed consent was obtained after describing risks and benefits of the procedure with the patient, this includes bleeding, bruising, infection, paralysis and medication side effects. The patient wishes to proceed and has given written consent. The patient was placed in a prone position. The lumbar and sacral area was marked and prepped with Betadine. A 25-gauge 1-1/2 inch needle was inserted into the skin and subcutaneous tissue at 3 sites in one ML of 1% lidocaine was injected into each site. Then a 20-gauge 15 cm radio frequency needle with a 1 cm curved active tip was inserted targeting the Right S1 SAP/sacral ala junction. Bone contact was made and confirmed with lateral imaging. Sensory stimulation at 50 Hz followed by motor stimulation at 2 Hz confirm proper needle location followed by injection of one ML of the solution containing one ML of 4 mg per mL dexamethasone and 3 mL of 1% MPF lidocaine. Then the Right L5 SAP/transverse process junction was targeted. Bone contact was made and confirmed with lateral imaging. Sensory stimulation at 50 Hz followed by motor stimulation at 2 Hz confirm proper needle location followed by injection of one ML of the solution containing one ML of 4 mg per mL dexamethasone and 3 mL of 1% MPF lidocaine. Then the Right L4 SAP/transverse process junction was targeted. Bone contact was made and confirmed with lateral imaging. Sensory stimulation at 50 Hz followed by motor stimulation at 2 Hz confirm proper needle location followed by injection of one ML of the solution containing one ML of 4 mg per mL dexamethasone and 3 mL of 1% MPF lidocaine. Radio frequency lesion being at 80C for 90 seconds  was performed. Needles were removed. Post procedure instructions and vital signs were performed. Patient tolerated procedure well. Followup appointment was given. 

## 2016-02-25 NOTE — Progress Notes (Signed)
  PROCEDURE RECORD [facility]   Name: Marcelus Garris DOB:[birth year] MRN: [id number]  Date:02/25/2016  Physician: Alysia Penna, MD    Nurse/CMA: Kayan Blissett, CMA  Allergies:  Allergies  Allergen Reactions  . Oxycodone-Acetaminophen Hives and Itching  . Zoloft [Sertraline Hcl]     ?weight gain. "made me feel weird"    Consent Signed: Yes.    Is patient diabetic? Yes.    CBG today? ?  Pregnant: No. LMP: No LMP for male patient. (age 37-55)  Anticoagulants: no Anti-inflammatory: no Antibiotics: no  Procedure: right radiofrequency neurotomy  Position: Prone Start Time: 10:54am  End Time: 11:15am  Fluoro Time: 30  RN/CMA Cirilo Canner, CMA Genella Bas, CMA    Time 10:40am 11:20am    BP 128/81 131/83    Pulse 92 79    Respirations 14 14    O2 Sat 95 97    S/S 6 6    Pain Level 4/10 0/10     D/C home with son, patient A & O X 3, D/C instructions reviewed, and sits independently.

## 2016-02-26 ENCOUNTER — Telehealth: Payer: Self-pay | Admitting: *Deleted

## 2016-02-26 ENCOUNTER — Other Ambulatory Visit: Payer: Self-pay | Admitting: Internal Medicine

## 2016-02-26 MED ORDER — POTASSIUM CHLORIDE CRYS ER 20 MEQ PO TBCR: 20.0000 meq | EXTENDED_RELEASE_TABLET | Freq: Every day | ORAL | 1 refills | Status: DC

## 2016-02-26 MED ORDER — ZOLPIDEM TARTRATE 10 MG PO TABS: 10.0000 mg | ORAL_TABLET | Freq: Every day | ORAL | 0 refills | Status: DC

## 2016-02-26 NOTE — Telephone Encounter (Signed)
Received faxes from [facility] requesting refills on Zolpidem 10mg  and Klor Con 56meq. KlorCon refill sent. Zolpidem Rx printed and forwarded to PCP for signature.

## 2016-02-26 NOTE — Telephone Encounter (Signed)
Rx faxed to mail order 

## 2016-02-26 NOTE — Addendum Note (Signed)
Addended by: Kelle Darting A on: 02/26/2016 09:25 AM   Modules accepted: Orders

## 2016-02-26 NOTE — Addendum Note (Signed)
Addended by: Debbrah Alar on: 02/26/2016 12:09 PM   Modules accepted: Orders

## 2016-02-26 NOTE — Telephone Encounter (Signed)
Notified pt and he is agreeable to proceed with nutrition referral. Referral pended.

## 2016-03-03 ENCOUNTER — Telehealth: Payer: Self-pay | Admitting: Family

## 2016-03-03 NOTE — Telephone Encounter (Signed)
Ok, that is fine

## 2016-03-03 NOTE — Telephone Encounter (Signed)
You can schedule him in a 30 minute slot for a procedure. If it's not something we do [name] can refer him to the necessary Provider and if it is something she can remove we will have adequate time for the procedure.  Thanks!

## 2016-03-03 NOTE — Telephone Encounter (Signed)
Patient has a skin tag at the waist line of his stomach. Patient states that Lenna Sciara has seen it before and he would like to get it removed. Please advise on whether to schedule procedure or if patient needs to come in to have it looked at first.    Patient phone: [phone]

## 2016-03-03 NOTE — Telephone Encounter (Signed)
Patient stated that since he cannot be seen until next week, he will go to some other place to have it done.

## 2016-03-04 ENCOUNTER — Ambulatory Visit (INDEPENDENT_AMBULATORY_CARE_PROVIDER_SITE_OTHER): Admitting: Pediatrics

## 2016-03-04 ENCOUNTER — Encounter: Payer: Self-pay | Admitting: Pediatrics

## 2016-03-04 VITALS — BP 138/78 | HR 88 | Temp 98.2°F | Ht 72.24 in | Wt 348.8 lb

## 2016-03-04 DIAGNOSIS — E119 Type 2 diabetes mellitus without complications: Secondary | ICD-10-CM | POA: Diagnosis not present

## 2016-03-04 DIAGNOSIS — L508 Other urticaria: Secondary | ICD-10-CM | POA: Diagnosis not present

## 2016-03-04 DIAGNOSIS — Z79899 Other long term (current) drug therapy: Secondary | ICD-10-CM

## 2016-03-04 DIAGNOSIS — I10 Essential (primary) hypertension: Secondary | ICD-10-CM

## 2016-03-04 DIAGNOSIS — L503 Dermatographic urticaria: Secondary | ICD-10-CM

## 2016-03-04 MED ORDER — TRIAMCINOLONE ACETONIDE 0.1 % EX OINT: TOPICAL_OINTMENT | CUTANEOUS | 3 refills | Status: DC

## 2016-03-04 NOTE — Progress Notes (Signed)
  [place] [address] Dept: [phone]  FOLLOW UP NOTE  Patient ID: Charles Nash, male    DOB: 22-Aug-1965  Age: 50 y.o. MRN: [id number] Date of Office Visit: 03/04/2016  Assessment  Chief Complaint: Urticaria (itching all over)  HPI Charles Nash presents for treatment of hives for the past 2 or 3 days. He had done well with regard to his hives since October 2015 Benadryl if needed was keeping  his hives under control. His diabetes is well controlled. He has gastroesophageal reflux and uses omeprazole. He has hypertension which is well controlled. He also has obstructive sleep apnea.  Current medications are Benadryl 25 mg every 4 hours if needed, fluticasone 2 sprays per nostril once a day , metoprolol XL 50 mg every 24 hours, omeprazole 20 mg once a day. His other medications are outlined in the chart   Drug Allergies:  Allergies  Allergen Reactions  . Oxycodone-Acetaminophen Hives and Itching  . Zoloft [Sertraline Hcl]     ?weight gain. "made me feel weird"    Physical Exam: BP 138/78   Pulse 88   Temp 98.2 F (36.8 C) (Oral)   Ht 6' 0.24" (1.835 m)   Wt (!) 348 lb 12.3 oz (158.2 kg)   SpO2 97%   BMI 46.98 kg/m    Physical Exam  Constitutional: He is oriented to person, place, and time. He appears well-developed and well-nourished.  HENT:  Eyes normal. Ears normal. Nose normal. Pharynx normal.  Neck: Neck supple. No thyromegaly present.  Cardiovascular:  S1 and S2 normal no murmurs  Pulmonary/Chest:  Clear to percussion and auscultation  Lymphadenopathy:    He has no cervical adenopathy.  Neurological: He is alert and oriented to person, place, and time.  Skin:  He had multiple hives and dermographia  Psychiatric: He has a normal mood and affect. His behavior is normal. Judgment and thought content normal.  Vitals reviewed.   Diagnostics:    Assessment and Plan: 1. Chronic urticaria   2. Dermographia   3.  Essential hypertension   4. Current use of beta blocker   5. Type II diabetes mellitus, well controlled ([place])     Meds ordered this encounter  Medications  . triamcinolone ointment (KENALOG) 0.1 %    Sig: Apply sparingly to affected areas twice daily as needed below face and neck    Dispense:  80 g    Refill:  3    Patient Instructions  Prednisone 20 mg twice a day for 3 days, 20 mg on the fourth day, 10 mg on the fifth day Zyrtec 10 mg-take 1 tablet twice a day for itching Triamcinolone 0.1% ointment twice a day if needed to red itchy areas below the face Call me if you are not doing better on this treatment plan   Return in about 4 weeks (around 04/01/2016).    Thank you for the opportunity to care for this patient.  Please do not hesitate to contact me with questions.  Penne Lash, M.D.  [facility] [address] [place], [place] [address] [phone]

## 2016-03-04 NOTE — Patient Instructions (Addendum)
Prednisone 20 mg twice a day for 3 days, 20 mg on the fourth day, 10 mg on the fifth day Zyrtec 10 mg-take 1 tablet twice a day for itching Triamcinolone 0.1% ointment twice a day if needed to red itchy areas below the face Call me if you are not doing better on this treatment plan

## 2016-03-05 DIAGNOSIS — L508 Other urticaria: Secondary | ICD-10-CM | POA: Insufficient documentation

## 2016-03-09 ENCOUNTER — Other Ambulatory Visit: Payer: Self-pay | Admitting: Family

## 2016-03-11 ENCOUNTER — Ambulatory Visit (INDEPENDENT_AMBULATORY_CARE_PROVIDER_SITE_OTHER): Admitting: Orthopaedic Surgery

## 2016-03-19 ENCOUNTER — Other Ambulatory Visit: Payer: Self-pay | Admitting: Family

## 2016-03-19 NOTE — Telephone Encounter (Signed)
Patient has appointment on 03/24/16

## 2016-03-21 ENCOUNTER — Encounter: Payer: Self-pay | Admitting: Family

## 2016-03-21 ENCOUNTER — Other Ambulatory Visit: Payer: Self-pay

## 2016-03-21 MED ORDER — JARDIANCE 10 MG PO TABS: 10.0000 mg | ORAL_TABLET | Freq: Every day | ORAL | 5 refills | Status: DC

## 2016-03-24 ENCOUNTER — Ambulatory Visit (INDEPENDENT_AMBULATORY_CARE_PROVIDER_SITE_OTHER): Admitting: Family

## 2016-03-24 ENCOUNTER — Encounter: Payer: Self-pay | Admitting: Family

## 2016-03-24 VITALS — BP 125/71 | HR 82 | Temp 97.9°F | Resp 18 | Ht 72.0 in | Wt 358.6 lb

## 2016-03-24 DIAGNOSIS — E1129 Type 2 diabetes mellitus with other diabetic kidney complication: Secondary | ICD-10-CM | POA: Diagnosis not present

## 2016-03-24 DIAGNOSIS — I10 Essential (primary) hypertension: Secondary | ICD-10-CM

## 2016-03-24 DIAGNOSIS — E785 Hyperlipidemia, unspecified: Secondary | ICD-10-CM

## 2016-03-24 DIAGNOSIS — F329 Major depressive disorder, single episode, unspecified: Secondary | ICD-10-CM | POA: Diagnosis not present

## 2016-03-24 DIAGNOSIS — F32A Depression, unspecified: Secondary | ICD-10-CM

## 2016-03-24 DIAGNOSIS — E119 Type 2 diabetes mellitus without complications: Secondary | ICD-10-CM

## 2016-03-24 MED ORDER — BUPROPION HCL ER (XL) 150 MG PO TB24: 150.0000 mg | ORAL_TABLET | Freq: Every day | ORAL | 2 refills | Status: DC

## 2016-03-24 MED ORDER — ZOLPIDEM TARTRATE 10 MG PO TABS: 10.0000 mg | ORAL_TABLET | Freq: Every day | ORAL | 0 refills | Status: DC

## 2016-03-24 NOTE — Progress Notes (Signed)
Subjective:    Patient ID: Charles Nash, male    DOB: 1965/09/21, 50 y.o.   MRN: [id number]  HPI  Mr. Suda is a 50 yr old male who presents today for follow up.   Depression- has had a lot of stress with his mother being ill- she has a brain tumor and recently moved to  SNF.  Reports that he stopped wellbutrin and he does not know why. Mood has not been good recently. Reports that his anxiety is well controlled however.   DM2- maintained on actos, jardiance. Fasting sugars around 120.  Lab Results  Component Value Date   HGBA1C 6.6 (H) 01/01/2016   HGBA1C 7.3 (H) 07/31/2015   HGBA1C 7.3 (H) 05/02/2015   Lab Results  Component Value Date   MICROALBUR 2.3 (H) 01/26/2015   LDLCALC 44 03/26/2015   CREATININE 0.90 01/01/2016   HTN- maintained on hydralazine, toprol.  Tribenzor. Reports mild LE edema.  BP Readings from Last 3 Encounters:  03/24/16 125/71  03/04/16 138/78  02/25/16 128/81   Insomnia- on ambien 10mg .    Hyperlipidemia- maintained on atorvastatin.  No myalgia.    Review of Systems See HPI  Past Medical History:  Diagnosis Date  . ADHD (attention deficit hyperactivity disorder)   . Allergy   . Arthritis   . Bilateral varicoceles   . Diabetes mellitus   . Fatty liver 08/06/2011  . Genital herpes 10/25/2014  . GERD (gastroesophageal reflux disease)   . Hyperlipidemia   . Hypertension   . OSA (obstructive sleep apnea)   . Personal history of colonic polyps   . Plantar fasciitis   . PTSD (post-traumatic stress disorder)      Social History   Social History  . Marital status: Married    Spouse name: N/A  . Number of children: 2  . Years of education: N/A   Occupational History  . [employer]   Social History Main Topics  . Smoking status: Never Smoker  . Smokeless tobacco: Never Used  . Alcohol use No     Comment: occasional wine  . Drug use: No  . Sexual activity: Not on file   Other Topics Concern  .  Not on file   Social History Narrative   Regular exercise:  No   Caffeine use:  2--32oz cups daily          Past Surgical History:  Procedure Laterality Date  . ANKLE SURGERY    . DOPPLER ECHOCARDIOGRAPHY  2010  . FOOT SURGERY    . KNEE SURGERY  08/04/08   Right knee-- medial meniscus repair, attenuation anterior cruciate [name])   . NM MYOVIEW LTD  2010  . PLANTAR FASCIA RELEASE    . SHOULDER SURGERY    . VARICOCELE EXCISION      Family History  Problem Relation Age of Onset  . Diabetes Mother   . Hypertension Mother   . Arthritis Mother   . Cancer Mother     uterine and breast cancer  . Hyperlipidemia Father   . Arthritis Father   . Cancer Father     thyroid, prostate cancer  . Other Father     mass in stomach  . Hyperlipidemia Maternal Grandmother   . Hypertension Maternal Grandmother   . Hyperlipidemia Maternal Grandfather   . Hypertension Maternal Grandfather   . Hyperlipidemia Paternal Grandmother   . Hypertension Paternal Grandmother   . Hyperlipidemia Paternal Grandfather   .  Hypertension Paternal Grandfather   . Heart attack Paternal Grandfather   . Sudden death Neg Hx   . Colon cancer Neg Hx   . Esophageal cancer Neg Hx   . Stomach cancer Neg Hx   . Rectal cancer Neg Hx     Allergies  Allergen Reactions  . Oxycodone-Acetaminophen Hives and Itching  . Zoloft [Sertraline Hcl]     ?weight gain. "made me feel weird"    Current Outpatient Prescriptions on File Prior to Visit  Medication Sig Dispense Refill  . aspirin 81 MG tablet Take 1 tablet (81 mg total) by mouth daily. 90 tablet 1  . atorvastatin (LIPITOR) 10 MG tablet TAKE 1 TABLET DAILY 90 tablet 1  . Blood Glucose Monitoring Suppl (FREESTYLE LITE) DEVI Use to check blood sugar once a day.  E11.9 1 each 0  . cetirizine (ZYRTEC) 10 MG tablet Take 1 tablet (10 mg total) by mouth daily. 30 tablet 5  . clomiPHENE (CLOMID) 50 MG tablet Take 0.5 tablets by mouth daily.     . diazepam (VALIUM) 10 MG tablet Take 1 tablet (10 mg total) by mouth every 6 (six) hours as needed for anxiety. 2 tablet 0  . diphenhydrAMINE (BENADRYL) 25 mg capsule Take 25-50 mg by mouth at bedtime as needed.    . fluticasone (FLONASE) 50 MCG/ACT nasal spray Place 2 sprays into both nostrils daily. 16 g 5  . FREESTYLE LITE test strip USE AS INSTRUCTED TO CHECK BLOOD SUGAR ONCE DAILY 100 each 1  . gabapentin (NEURONTIN) 600 MG tablet Take 1 tablet (600 mg total) by mouth 3 (three) times daily. Take only on day of procedure 6 tablet 1  . hydrALAZINE (APRESOLINE) 50 MG tablet TAKE 1 TABLET DAILY 90 tablet 1  . Insulin Pen Needle (NOVOFINE) 32G X 6 MM MISC Use once daily 90 each 1  . JARDIANCE 10 MG TABS tablet Take 10 mg by mouth daily. 30 tablet 5  . Lancets (FREESTYLE) lancets USE TO CHECK BLOOD SUGAR ONCE DAILY 100 each 1  . liraglutide (VICTOZA) 18 MG/3ML SOPN Inject 0.3 mLs (1.8 mg total) into the skin daily. 27 mL 1  . meloxicam (MOBIC) 7.5 MG tablet TAKE 1 TABLET DAILY 90 tablet 0  . metoprolol succinate (TOPROL XL) 50 MG 24 hr tablet Take 2 tablets (100 mg total) by mouth daily. PLEASE CONTACT OFFICE FOR ADDITIONAL REFILLS 2ND ATTEMPT 135 tablet 0  . omeprazole (PRILOSEC) 20 MG capsule Take 2 capsules (40 mg total) by mouth every morning. 28 capsule 0  . omeprazole (PRILOSEC) 20 MG capsule TAKE 2 CAPSULES EVERY MORNING 180 capsule 0  . OVER THE COUNTER MEDICATION MAXIMIZE LIVING.  Maxfit.  Take 1 tablet daily.    Marland Kitchen OVER THE COUNTER MEDICATION OPTIMAL LIVING fish oil.  Take 2 capsules daily.    . pioglitazone (ACTOS) 30 MG tablet TAKE 1 TABLET DAILY 90 tablet 1  . potassium chloride SA (KLOR-CON M20) 20 MEQ tablet Take 1 tablet (20 mEq total) by mouth daily. 90 tablet 1  . Pyridoxine HCl (B-6 PO) Take 1 tablet by mouth daily.    . sildenafil (VIAGRA) 100 MG tablet Take 1/2 to 1 tablet daily as needed 15 tablet 0  . triamcinolone ointment (KENALOG) 0.1 % Apply sparingly to affected  areas twice daily as needed below face and neck 80 g 3  . TRIBENZOR 40-10-25 MG TABS TAKE 1 TABLET DAILY 90 tablet 1  . [DISCONTINUED] potassium chloride (K-DUR) 10 MEQ tablet Take 1 tablet (  10 mEq total) by mouth daily. 30 tablet 1   No current facility-administered medications on file prior to visit.     BP 125/71 (BP Location: Right Arm, Cuff Size: Large)   Pulse 82   Temp 97.9 F (36.6 C) (Oral)   Resp 18   Ht 6' (1.829 m)   Wt (!) 358 lb 9.6 oz (162.7 kg)   SpO2 98% Comment: room air  BMI 48.63 kg/m       Objective:   Physical Exam  Constitutional: He is oriented to person, place, and time. He appears well-developed and well-nourished. No distress.  HENT:  Head: Normocephalic and atraumatic.  Cardiovascular: Normal rate and regular rhythm.   No murmur heard. Pulmonary/Chest: Effort normal and breath sounds normal. No respiratory distress. He has no wheezes. He has no rales.  Musculoskeletal: He exhibits no edema.  Neurological: He is alert and oriented to person, place, and time.  Skin: Skin is warm and dry.  Psychiatric: He has a normal mood and affect. His behavior is normal. Thought content normal.          Assessment & Plan:

## 2016-03-24 NOTE — Assessment & Plan Note (Signed)
Clinically stable, continue current meds. Obtain follow up bmet.

## 2016-03-24 NOTE — Assessment & Plan Note (Signed)
Tolerating statin. LDL at goal. Continue statin, obtain follow up lipid panel.

## 2016-03-24 NOTE — Patient Instructions (Signed)
Please schedule lab draw. Restart wellbutrin.

## 2016-03-24 NOTE — Progress Notes (Signed)
Pre visit review using our clinic review tool, if applicable. No additional management support is needed unless otherwise documented below in the visit note. 

## 2016-03-24 NOTE — Assessment & Plan Note (Signed)
Stable on current medications. Continue same. 

## 2016-03-24 NOTE — Assessment & Plan Note (Signed)
Uncontrolled. Restart wellbutrin.

## 2016-04-01 ENCOUNTER — Encounter: Attending: Physical Medicine & Rehabilitation

## 2016-04-01 ENCOUNTER — Encounter: Payer: Self-pay | Admitting: Physical Medicine & Rehabilitation

## 2016-04-01 ENCOUNTER — Ambulatory Visit (HOSPITAL_BASED_OUTPATIENT_CLINIC_OR_DEPARTMENT_OTHER): Admitting: Physical Medicine & Rehabilitation

## 2016-04-01 VITALS — BP 121/76 | HR 79

## 2016-04-01 DIAGNOSIS — E785 Hyperlipidemia, unspecified: Secondary | ICD-10-CM | POA: Diagnosis not present

## 2016-04-01 DIAGNOSIS — G4733 Obstructive sleep apnea (adult) (pediatric): Secondary | ICD-10-CM | POA: Insufficient documentation

## 2016-04-01 DIAGNOSIS — Z79899 Other long term (current) drug therapy: Secondary | ICD-10-CM | POA: Insufficient documentation

## 2016-04-01 DIAGNOSIS — M47816 Spondylosis without myelopathy or radiculopathy, lumbar region: Secondary | ICD-10-CM | POA: Insufficient documentation

## 2016-04-01 DIAGNOSIS — M222X1 Patellofemoral disorders, right knee: Secondary | ICD-10-CM | POA: Insufficient documentation

## 2016-04-01 DIAGNOSIS — G8929 Other chronic pain: Secondary | ICD-10-CM | POA: Insufficient documentation

## 2016-04-01 DIAGNOSIS — I10 Essential (primary) hypertension: Secondary | ICD-10-CM | POA: Diagnosis not present

## 2016-04-01 DIAGNOSIS — M545 Low back pain: Secondary | ICD-10-CM | POA: Insufficient documentation

## 2016-04-01 DIAGNOSIS — F4310 Post-traumatic stress disorder, unspecified: Secondary | ICD-10-CM | POA: Insufficient documentation

## 2016-04-01 DIAGNOSIS — E119 Type 2 diabetes mellitus without complications: Secondary | ICD-10-CM | POA: Diagnosis not present

## 2016-04-01 DIAGNOSIS — K760 Fatty (change of) liver, not elsewhere classified: Secondary | ICD-10-CM | POA: Insufficient documentation

## 2016-04-01 DIAGNOSIS — K219 Gastro-esophageal reflux disease without esophagitis: Secondary | ICD-10-CM | POA: Insufficient documentation

## 2016-04-01 NOTE — Patient Instructions (Signed)

## 2016-04-01 NOTE — Progress Notes (Signed)
Left L5 dorsal ramus., left L4 and left L3 medial branch radio frequency neurotomy under fluoroscopic guidance  Indication: Low back pain due to lumbar spondylosis which has been relieved on 2 occasions by greater than 50% by lumbar medial branch blocks at corresponding levels.  Informed consent was obtained after describing risks and benefits of the procedure with the patient, this includes bleeding, bruising, infection, paralysis and medication side effects. The patient wishes to proceed and has given written consent. The patient was placed in a prone position. The lumbar and sacral area was marked and prepped with Betadine. A 25-gauge 1-1/2 inch needle was inserted into the skin and subcutaneous tissue at 3 sites in one ML of 1% lidocaine was injected into each site. Then a 20-gauge 15 cm radio frequency needle with a 1 cm curved active tip was inserted targeting the left S1 SAP/sacral ala junction. Bone contact was made and confirmed with lateral imaging. Sensory stimulation at 50 Hz followed by motor stimulation at 2 Hz confirm proper needle location followed by injection of one ML of the solution containing one ML of 4 mg per mL dexamethasone and 3 mL of 1% MPF lidocaine. Then the left L5 SAP/transverse process junction was targeted. Bone contact was made and confirmed with lateral imaging. Sensory stimulation at 50 Hz followed by motor stimulation at 2 Hz confirm proper needle location followed by injection of one ML of the solution containing one ML of 4 mg per mL dexamethasone and 3 mL of 1% MPF lidocaine. Then the left L4 SAP/transverse process junction was targeted. Bone contact was made and confirmed with lateral imaging. Sensory stimulation at 50 Hz followed by motor stimulation at 2 Hz confirm proper needle location followed by injection of one ML of the solution containing one ML of 4 mg per mL dexamethasone and 3 mL of 1% MPF lidocaine. Radio frequency lesion being at [facility] for 90 seconds was  performed. Needles were removed. Post procedure instructions and vital signs were performed. Patient tolerated procedure well. Followup appointment was given.

## 2016-04-01 NOTE — Progress Notes (Signed)
  PROCEDURE RECORD [facility]   Name: Charles Nash DOB:21-May-1965 MRN: [id number]  Date:04/01/2016  Physician: Alysia Penna, MD    Nurse/CMA: Wessling CMA / Bright CMA  Allergies:  Allergies  Allergen Reactions  . Oxycodone-Acetaminophen Hives and Itching  . Zoloft [Sertraline Hcl]     ?weight gain. "made me feel weird"    Consent Signed: Yes.    Is patient diabetic? Yes.    CBG today? .  Pregnant: No. LMP: No LMP for male patient. (age 49-55)  Anticoagulants: no Anti-inflammatory: yes (took 2 days ago) Antibiotics: no  Procedure: L3-L5 Radio Frequency  Position: Prone Start Time: 11:17am End Time: 11:38am  Fluoro Time:53s  RN/CMA Bright CMA Bright CMA    Time 1105 1140    BP 121/76 129/83    Pulse 79 75    Respirations 16 16    O2 Sat 94 96    S/S 6 6    Pain Level 4 6     D/C home with friend, patient A & O X 3, D/C instructions reviewed, and sits independently.

## 2016-04-03 ENCOUNTER — Other Ambulatory Visit (INDEPENDENT_AMBULATORY_CARE_PROVIDER_SITE_OTHER)

## 2016-04-03 ENCOUNTER — Telehealth: Payer: Self-pay | Admitting: *Deleted

## 2016-04-03 ENCOUNTER — Encounter: Payer: Self-pay | Admitting: Physical Medicine & Rehabilitation

## 2016-04-03 DIAGNOSIS — E785 Hyperlipidemia, unspecified: Secondary | ICD-10-CM | POA: Diagnosis not present

## 2016-04-03 DIAGNOSIS — Z0000 Encounter for general adult medical examination without abnormal findings: Secondary | ICD-10-CM

## 2016-04-03 DIAGNOSIS — E1129 Type 2 diabetes mellitus with other diabetic kidney complication: Secondary | ICD-10-CM

## 2016-04-03 LAB — LIPID PANEL
CHOL/HDL RATIO: 3
Cholesterol: 130 mg/dL (ref 0–200)
HDL: 50.4 mg/dL (ref >39.00)
LDL Cholesterol: 66 mg/dL (ref 0–99)
NONHDL: 79.24
Triglycerides: 67 mg/dL (ref 0.0–149.0)
VLDL: 13.4 mg/dL (ref 0.0–40.0)

## 2016-04-03 LAB — BASIC METABOLIC PANEL
BUN: 13 mg/dL (ref 6–23)
CO2: 28 mEq/L (ref 19–32)
Calcium: 8.9 mg/dL (ref 8.4–10.5)
Chloride: 104 mEq/L (ref 96–112)
Creatinine, Ser: 0.96 mg/dL (ref 0.40–1.50)
GFR: 106.74 mL/min (ref >60.00)
GLUCOSE: 115 mg/dL — AB (ref 70–99)
POTASSIUM: 3.9 meq/L (ref 3.5–5.1)
SODIUM: 140 meq/L (ref 135–145)

## 2016-04-03 LAB — HEMOGLOBIN A1C: Hgb A1c MFr Bld: 6.8 % — ABNORMAL HIGH (ref 4.6–6.5)

## 2016-04-03 NOTE — Telephone Encounter (Signed)
Notified. 

## 2016-04-03 NOTE — Telephone Encounter (Signed)
Okay to get a massage post RF

## 2016-04-03 NOTE — Telephone Encounter (Signed)
Mr Mcdonnel is asking if there would be a problem with him getting a deep tissue massage (@ [facility]) after having the RF procedure.  They were saying he needed ok from MD.  Please advise

## 2016-04-04 ENCOUNTER — Telehealth: Payer: Self-pay | Admitting: *Deleted

## 2016-04-04 LAB — PSA: PSA: 1.32 ng/mL (ref 0.10–4.00)

## 2016-04-04 LAB — TSH: TSH: 1.42 u[IU]/mL (ref 0.35–4.50)

## 2016-04-04 NOTE — Telephone Encounter (Signed)
Done

## 2016-04-04 NOTE — Progress Notes (Signed)
P 

## 2016-04-04 NOTE — Telephone Encounter (Signed)
-----   Message from Peggyann Shoals, RMA sent at 04/03/2016  7:13 AM EST ----- Regarding: LAB Patient had labs done this morning, pt also was requesting if we could add a PSA & TSH. Please advise

## 2016-04-08 ENCOUNTER — Encounter: Payer: Self-pay | Admitting: Family

## 2016-04-15 ENCOUNTER — Encounter: Payer: Self-pay | Admitting: Family

## 2016-04-22 ENCOUNTER — Ambulatory Visit (INDEPENDENT_AMBULATORY_CARE_PROVIDER_SITE_OTHER): Admitting: Pediatrics

## 2016-04-22 ENCOUNTER — Encounter: Payer: Self-pay | Admitting: Pediatrics

## 2016-04-22 VITALS — BP 164/82 | HR 76 | Temp 97.4°F | Resp 16

## 2016-04-22 DIAGNOSIS — I10 Essential (primary) hypertension: Secondary | ICD-10-CM

## 2016-04-22 DIAGNOSIS — G4733 Obstructive sleep apnea (adult) (pediatric): Secondary | ICD-10-CM | POA: Diagnosis not present

## 2016-04-22 DIAGNOSIS — L853 Xerosis cutis: Secondary | ICD-10-CM | POA: Diagnosis not present

## 2016-04-22 DIAGNOSIS — L503 Dermatographic urticaria: Secondary | ICD-10-CM | POA: Diagnosis not present

## 2016-04-22 DIAGNOSIS — Z9989 Dependence on other enabling machines and devices: Secondary | ICD-10-CM

## 2016-04-22 MED ORDER — TRIAMCINOLONE ACETONIDE 0.1 % EX OINT
TOPICAL_OINTMENT | CUTANEOUS | 3 refills | Status: DC
Start: 2016-04-22 — End: 2018-04-29

## 2016-04-22 NOTE — Progress Notes (Signed)
  [place] [address] Dept: [phone]  FOLLOW UP NOTE  Patient ID: Charles Nash, male    DOB: 1965-10-04  Age: 51 y.o. MRN: [id number] Date of Office Visit: 04/22/2016  Assessment  Chief Complaint: Eczema  HPI [name] presents for evaluation of a dry skin particularly on his back. He has itching in one elbow. He takes cetirizine 10 mg once a day he has obstructive sleep apnea and uses CPAP. He has triamcinolone ointment to use in the red itchy areas below the face  Current medications are cetirizine 10 mg once a day, fluticasone 2 sprays per nostril once a day, triamcinolone 0.1% ointment once a day to red itchy areas below the face. His other medications are outlined in the chart   Drug Allergies:  Allergies  Allergen Reactions  . Oxycodone-Acetaminophen Hives and Itching  . Zoloft [Sertraline Hcl]     ?weight gain. "made me feel weird"    Physical Exam: BP (!) 164/82   Pulse 76   Temp 97.4 F (36.3 C) (Oral)   Resp 16    Physical Exam  Constitutional: He appears well-developed and well-nourished.  HENT:  Eyes normal. Ears normal. Nose mild swelling of nasal  turbinates. Pharynx normal.  Neck: Neck supple.  Cardiovascular:  S1 and S2 normal no murmurs  Pulmonary/Chest:  Clear to percussion and  auscultation  Lymphadenopathy:    He has no cervical adenopathy.  Neurological: He is alert.  Skin:  The skin was very dry  Psychiatric: He has a normal mood and affect. His behavior is normal. Judgment and thought content normal.  Vitals reviewed.   Diagnostics:  none  Assessment and Plan: 1. Dermographia   2. Obstructive sleep apnea treated with continuous positive airway pressure (CPAP)   3. Essential hypertension   4. Xerosis of skin     Meds ordered this encounter  Medications  . triamcinolone ointment (KENALOG) 0.1 %    Sig: Apply to red itchy areas only below the face    Dispense:  80 g    Refill:  3      Patient Instructions  Used Dreft or Ivory detergent. No bleaches   or softeners Zyrtec 10 mg once a day for runny nose or itching Nasal saline irrigations at night followed by fluticasone 2 sprays per nostril at night Daily bath or shower. Pat dry and use triamcinolone 0.1% ointment to red itchy areas only below the face. Wait 10 minutes and then use Lubriderm or Cetaphil lotion all over   Return in about 4 weeks (around 05/20/2016).    Thank you for the opportunity to care for this patient.  Please do not hesitate to contact me with questions.  Penne Lash, M.D.  [facility] [address] [place], [place] [address] [phone]

## 2016-04-22 NOTE — Patient Instructions (Addendum)
Used Dreft or Ivory detergent. No bleaches   or softeners Zyrtec 10 mg once a day for runny nose or itching Nasal saline irrigations at night followed by fluticasone 2 sprays per nostril at night Daily bath or shower. Pat dry and use triamcinolone 0.1% ointment to red itchy areas only below the face. Wait 10 minutes and then use Lubriderm or Cetaphil lotion all over. Continue on your other medications

## 2016-04-24 ENCOUNTER — Other Ambulatory Visit: Payer: Self-pay | Admitting: Family

## 2016-04-25 ENCOUNTER — Telehealth: Payer: Self-pay | Admitting: Family

## 2016-04-25 NOTE — Telephone Encounter (Signed)
Relation to [id number] Call back number:[phone]   Reason for call:  Patient would like to pick up 01/01/16 office notes this afternoon, please advise

## 2016-04-25 NOTE — Telephone Encounter (Signed)
Tiffany-- I have printed 01/01/16 office note and placed in file up front. Please let pt know he can pick it up this afternoon but he will also need to sign a records release to himself.

## 2016-04-25 NOTE — Telephone Encounter (Signed)
Patient informed. 

## 2016-05-21 ENCOUNTER — Ambulatory Visit: Admitting: Allergy and Immunology

## 2016-05-30 ENCOUNTER — Other Ambulatory Visit: Payer: Self-pay | Admitting: Family

## 2016-05-30 NOTE — Telephone Encounter (Signed)
Please advise Meloxicam request? Pt is requesting 90 days to mail order.

## 2016-06-09 ENCOUNTER — Other Ambulatory Visit: Payer: Self-pay | Admitting: Family

## 2016-06-11 ENCOUNTER — Other Ambulatory Visit: Payer: Self-pay | Admitting: Family

## 2016-06-23 ENCOUNTER — Other Ambulatory Visit: Payer: Self-pay | Admitting: Family

## 2016-06-27 ENCOUNTER — Ambulatory Visit (INDEPENDENT_AMBULATORY_CARE_PROVIDER_SITE_OTHER): Admitting: Family Medicine

## 2016-06-27 ENCOUNTER — Encounter: Payer: Self-pay | Admitting: Family Medicine

## 2016-06-27 VITALS — BP 152/84 | HR 76 | Ht 73.0 in | Wt 360.0 lb

## 2016-06-27 DIAGNOSIS — M1711 Unilateral primary osteoarthritis, right knee: Secondary | ICD-10-CM

## 2016-06-27 DIAGNOSIS — M25561 Pain in right knee: Secondary | ICD-10-CM

## 2016-06-27 MED ORDER — METHYLPREDNISOLONE ACETATE 40 MG/ML IJ SUSP
40.0000 mg | Freq: Once | INTRAMUSCULAR | Status: AC
  Administered 2016-06-27: 40 mg via INTRA_ARTICULAR

## 2016-06-30 ENCOUNTER — Ambulatory Visit: Payer: Self-pay | Admitting: Family Medicine

## 2016-06-30 NOTE — Assessment & Plan Note (Signed)
Discussed options - went ahead with cortisone injection today.  Quad strengthening.  Ice/heat if needed.  Tylenol, meloxicam if needed.  F/u prn.  After informed written consent, patient was seated on exam table. Right knee was prepped with alcohol swab and utilizing anteromedial approach, patient's right knee was injected intraarticularly with 3:1 bupivicaine: depomedrol. Patient tolerated the procedure well without immediate complications.

## 2016-06-30 NOTE — Progress Notes (Signed)
PCP: Nance Pear., NP  Subjective:   HPI: Patient is a 51 y.o. male here for right knee pain.  Patient returns with recurrence of right knee pain. Pain level 4/10 and sharp, anterior. Worse with walking. Has known history of arthritis - done cortisone and viscosupplementation previously. Last one of these several months ago. Has been taking meloxicam which helps. No skin changes, numbness. Some swelling.  Past Medical History:  Diagnosis Date  . ADHD (attention deficit hyperactivity disorder)   . Allergy   . Arthritis   . Bilateral varicoceles   . Diabetes mellitus   . Fatty liver 08/06/2011  . Genital herpes 10/25/2014  . GERD (gastroesophageal reflux disease)   . Hyperlipidemia   . Hypertension   . OSA (obstructive sleep apnea)   . Personal history of colonic polyps   . Plantar fasciitis   . PTSD (post-traumatic stress disorder)     Current Outpatient Prescriptions on File Prior to Visit  Medication Sig Dispense Refill  . aspirin 81 MG tablet Take 1 tablet (81 mg total) by mouth daily. 90 tablet 1  . atorvastatin (LIPITOR) 10 MG tablet TAKE 1 TABLET DAILY 90 tablet 1  . Blood Glucose Monitoring Suppl (FREESTYLE LITE) DEVI Use to check blood sugar once a day.  E11.9 1 each 0  . buPROPion (WELLBUTRIN XL) 150 MG 24 hr tablet Take 1 tablet (150 mg total) by mouth daily. 30 tablet 2  . cetirizine (ZYRTEC) 10 MG tablet Take 1 tablet (10 mg total) by mouth daily. 30 tablet 5  . clomiPHENE (CLOMID) 50 MG tablet Take 0.5 tablets by mouth daily.    . diazepam (VALIUM) 10 MG tablet Take 1 tablet (10 mg total) by mouth every 6 (six) hours as needed for anxiety. 2 tablet 0  . diphenhydrAMINE (BENADRYL) 25 mg capsule Take 25-50 mg by mouth at bedtime as needed.    . fluticasone (FLONASE) 50 MCG/ACT nasal spray Place 2 sprays into both nostrils daily. 16 g 5  . FREESTYLE LITE test strip USE AS INSTRUCTED TO CHECK BLOOD SUGAR ONCE DAILY 100 each 1  . gabapentin (NEURONTIN) 600  MG tablet Take 1 tablet (600 mg total) by mouth 3 (three) times daily. Take only on day of procedure 6 tablet 1  . hydrALAZINE (APRESOLINE) 50 MG tablet TAKE 1 TABLET DAILY 90 tablet 1  . JARDIANCE 10 MG TABS tablet TAKE 1 TABLET DAILY 90 tablet 1  . Lancets (FREESTYLE) lancets USE TO CHECK BLOOD SUGAR ONCE DAILY 100 each 1  . liraglutide (VICTOZA) 18 MG/3ML SOPN Inject 0.3 mLs (1.8 mg total) into the skin daily. 27 mL 1  . meloxicam (MOBIC) 7.5 MG tablet TAKE 1 TABLET DAILY 90 tablet 0  . metoprolol succinate (TOPROL XL) 50 MG 24 hr tablet Take 2 tablets (100 mg total) by mouth daily. PLEASE CONTACT OFFICE FOR ADDITIONAL REFILLS 2ND ATTEMPT 135 tablet 0  . omeprazole (PRILOSEC) 20 MG capsule TAKE 2 CAPSULES EVERY MORNING 180 capsule 1  . OVER THE COUNTER MEDICATION MAXIMIZE LIVING.  Maxfit.  Take 1 tablet daily.    Marland Kitchen OVER THE COUNTER MEDICATION OPTIMAL LIVING fish oil.  Take 2 capsules daily.    . pioglitazone (ACTOS) 30 MG tablet TAKE 1 TABLET DAILY 90 tablet 1  . potassium chloride SA (KLOR-CON M20) 20 MEQ tablet Take 1 tablet (20 mEq total) by mouth daily. 90 tablet 1  . Pyridoxine HCl (B-6 PO) Take 1 tablet by mouth daily.    Marland Kitchen triamcinolone ointment (KENALOG)  0.1 % Apply to red itchy areas only below the face 80 g 3  . TRIBENZOR 40-10-25 MG TABS TAKE 1 TABLET DAILY 90 tablet 1  . VIAGRA 100 MG tablet TAKE ONE-HALF (1/2) TO 1 TABLET DAILY AS NEEDED 15 tablet 1  . zolpidem (AMBIEN) 10 MG tablet Take 1 tablet (10 mg total) by mouth daily. 30 tablet 0  . [DISCONTINUED] potassium chloride (K-DUR) 10 MEQ tablet Take 1 tablet (10 mEq total) by mouth daily. 30 tablet 1   No current facility-administered medications on file prior to visit.     Past Surgical History:  Procedure Laterality Date  . ANKLE SURGERY    . DOPPLER ECHOCARDIOGRAPHY  2010  . FOOT SURGERY    . KNEE SURGERY  08/04/08   Right knee-- medial meniscus repair, attenuation anterior cruciate [name])   . NM MYOVIEW LTD  2010  . PLANTAR FASCIA RELEASE    . SHOULDER SURGERY    . VARICOCELE EXCISION      Allergies  Allergen Reactions  . Oxycodone-Acetaminophen Hives and Itching  . Zoloft [Sertraline Hcl]     ?weight gain. "made me feel weird"    Social History   Social History  . Marital status: Married    Spouse name: N/A  . Number of children: 2  . Years of education: N/A   Occupational History  . [employer]   Social History Main Topics  . Smoking status: Never Smoker  . Smokeless tobacco: Never Used  . Alcohol use No     Comment: occasional wine  . Drug use: No  . Sexual activity: Not on file   Other Topics Concern  . Not on file   Social History Narrative   Regular exercise:  No   Caffeine use:  2--32oz cups daily          Family History  Problem Relation Age of Onset  . Diabetes Mother   . Hypertension Mother   . Arthritis Mother   . Cancer Mother     uterine and breast cancer, sarcoma of the brain  . Hyperlipidemia Father   . Arthritis Father   . Cancer Father     thyroid, prostate cancer  . Other Father     mass in stomach  . Hyperlipidemia Maternal Grandmother   . Hypertension Maternal Grandmother   . Hyperlipidemia Maternal Grandfather   . Hypertension Maternal Grandfather   . Hyperlipidemia Paternal Grandmother   . Hypertension Paternal Grandmother   . Hyperlipidemia Paternal Grandfather   . Hypertension Paternal Grandfather   . Heart attack Paternal Grandfather   . Sudden death Neg Hx   . Colon cancer Neg Hx   . Esophageal cancer Neg Hx   . Stomach cancer Neg Hx   . Rectal cancer Neg Hx     BP (!) 152/84   Pulse 76   Ht 6\' 1"  (1.854 m)   Wt (!) 360 lb (163.3 kg)   BMI 47.50 kg/m   Review of Systems: See HPI above.     Objective:  Physical Exam:  Gen: NAD, comfortable in exam room  Right knee: No effusion, bruising, other deformity. TTP medial joint line > post patellar facets.  No  other tenderness. FROM. Negative ant/post drawers. Negative valgus/varus testing. Negative lachmanns. Negative mcmurrays, apleys, patellar apprehension. NV intact distally.  Left knee: FROM without pain currently.   Assessment & Plan:  1. 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  Discussed options -  went ahead with cortisone injection today.  Quad strengthening.  Ice/heat if needed.  Tylenol, meloxicam if needed.  F/u prn.  After informed written consent, patient was seated on exam table. Right knee was prepped with alcohol swab and utilizing anteromedial approach, patient's right knee was injected intraarticularly with 3:1 bupivicaine: depomedrol. Patient tolerated the procedure well without immediate complications.

## 2016-07-09 ENCOUNTER — Encounter: Payer: Self-pay | Admitting: Gastroenterology

## 2016-07-17 ENCOUNTER — Encounter: Payer: Self-pay | Admitting: Family

## 2016-07-17 NOTE — Telephone Encounter (Signed)
Copy & Pasted in to second note with ending documentation of this note and forwarded to PCP/SLS 04/05

## 2016-07-17 NOTE — Telephone Encounter (Signed)
First part of My Chart Message, Copy & Pasted: A cardiology referral will be entered for further testing in regards to your EKG. [facility] will contact you with an appointment. Please get a letter from your md that he or she has been managing your diabetes. Your glucose results is 173 and the normal range is 70-140. Your hemoglobin A1C is 7.1 and the normal is less than 6.5. Your iron is low at 33 and the normal reading is 50-160. Your vitamin D is slightly low at 29 and the normal is 30-100.  In saying that:  For your iron, per the team it is recommended that you take over the counter supplementation as well as Ferrous Sulfate 325mg  which contains 65 mg elemental iron. Take this twice a day for 8 weeks and return for repeat labs. Take with chewable vitamin C to increase absorption. Iron supplementation may cause constipation, so take colace (stool softeners) if necessary.  Your vitamin D was just slightly low 29 and the normal is 30-100. Per the team please buy vitamin D3 over the counter and take it daily 2,000 IU.

## 2016-07-23 ENCOUNTER — Ambulatory Visit (INDEPENDENT_AMBULATORY_CARE_PROVIDER_SITE_OTHER): Admitting: Family

## 2016-07-23 ENCOUNTER — Encounter: Payer: Self-pay | Admitting: Family

## 2016-07-23 ENCOUNTER — Telehealth: Payer: Self-pay | Admitting: *Deleted

## 2016-07-23 DIAGNOSIS — F329 Major depressive disorder, single episode, unspecified: Secondary | ICD-10-CM

## 2016-07-23 DIAGNOSIS — E119 Type 2 diabetes mellitus without complications: Secondary | ICD-10-CM | POA: Diagnosis not present

## 2016-07-23 DIAGNOSIS — E785 Hyperlipidemia, unspecified: Secondary | ICD-10-CM

## 2016-07-23 DIAGNOSIS — I10 Essential (primary) hypertension: Secondary | ICD-10-CM

## 2016-07-23 DIAGNOSIS — F32A Depression, unspecified: Secondary | ICD-10-CM

## 2016-07-23 MED ORDER — ZOLPIDEM TARTRATE 10 MG PO TABS: 10.0000 mg | ORAL_TABLET | Freq: Every day | ORAL | 0 refills | Status: DC

## 2016-07-23 NOTE — Assessment & Plan Note (Signed)
Uncontrolled, scored 14 on PHQ-9. Advised pt to arrange follow up with his psychiatrist.

## 2016-07-23 NOTE — Assessment & Plan Note (Signed)
LDL at goal, continue statin. 

## 2016-07-23 NOTE — Telephone Encounter (Signed)
Noted  

## 2016-07-23 NOTE — Progress Notes (Signed)
Pre visit review using our clinic review tool, if applicable. No additional management support is needed unless otherwise documented below in the visit note. 

## 2016-07-23 NOTE — Assessment & Plan Note (Signed)
BP stable on current meds. Continue same.  

## 2016-07-23 NOTE — Assessment & Plan Note (Signed)
A1C up a bit to 7.1  Should improve with weight loss following his bariatric surgery.

## 2016-07-23 NOTE — Telephone Encounter (Signed)
Pt was seen in office today and requested refill of zolpidem go to [facility]. Rx printed and placed on PCP's desk for signature on Friday. Pt updated CSC and provided UDS today.

## 2016-07-23 NOTE — Patient Instructions (Signed)
Please call GI to scheduled a colonoscopy [phone]. Schedule follow up with your psychiatrist at the [place].

## 2016-07-23 NOTE — Progress Notes (Signed)
Subjective:    Patient ID: Charles Nash, male    DOB: 09-29-1965, 51 y.o.   MRN: [id number]  HPI  Charles Nash is a 51 yr old male who presents today for follow up.  1) DM2- Reports that his last A1C was 7.1 at [facility].  (reviewed in care everywhere) was drawn on 06/17/16. Lab Results  Component Value Date   HGBA1C 6.8 (H) 04/03/2016   HGBA1C 6.6 (H) 01/01/2016   HGBA1C 7.3 (H) 07/31/2015   Lab Results  Component Value Date   MICROALBUR 2.3 (H) 01/26/2015   LDLCALC 66 04/03/2016   CREATININE 0.96 04/03/2016   2) Depression- maintained on wellbutrin,  Reports that he has not seen his psychiatrist in  While.    3) HTN-  BP Readings from Last 3 Encounters:  07/23/16 126/61  06/27/16 (!) 152/84  04/22/16 (!) 164/82   4) Hyperlipidemia-  Lab Results  Component Value Date   CHOL 130 04/03/2016   HDL 50.40 04/03/2016   LDLCALC 66 04/03/2016   TRIG 67.0 04/03/2016   CHOLHDL 3 04/03/2016   5) Insomnia-  Using ambien prn.  6) Obesity- following with the bariatric clinic.  He had EGD performed yesterday as part of his bariatric work up.    Iron deficiecy- iron was 33 on 3/6 and he was placed on an iron supplement.    LDL was 65. Vit D mildly low- placed on supplement.  TSH was low normal at 0.5  Review of Systems    see HPI  Past Medical History:  Diagnosis Date  . ADHD (attention deficit hyperactivity disorder)   . Allergy   . Arthritis   . Bilateral varicoceles   . Diabetes mellitus   . Fatty liver 08/06/2011  . Genital herpes 10/25/2014  . GERD (gastroesophageal reflux disease)   . Hyperlipidemia   . Hypertension   . OSA (obstructive sleep apnea)   . Personal history of colonic polyps   . Plantar fasciitis   . PTSD (post-traumatic stress disorder)      Social History   Social History  . Marital status: Married    Spouse name: N/A  . Number of children: 2  . Years of education: N/A   Occupational History  . [employer]     Social History Main Topics  . Smoking status: Never Smoker  . Smokeless tobacco: Never Used  . Alcohol use No     Comment: occasional wine  . Drug use: No  . Sexual activity: Not on file   Other Topics Concern  . Not on file   Social History Narrative   Regular exercise:  No   Caffeine use:  2--32oz cups daily          Past Surgical History:  Procedure Laterality Date  . ANKLE SURGERY    . DOPPLER ECHOCARDIOGRAPHY  2010  . FOOT SURGERY    . KNEE SURGERY  08/04/08   Right knee-- medial meniscus repair, attenuation anterior cruciate [name] MD [facility])   . NM MYOVIEW LTD  2010  . PLANTAR FASCIA RELEASE    . SHOULDER SURGERY    . VARICOCELE EXCISION      Family History  Problem Relation Age of Onset  . Diabetes Mother   . Hypertension Mother   . Arthritis Mother   . Cancer Mother     uterine and breast cancer, sarcoma of the brain  . Hyperlipidemia Father   . Arthritis Father   .  Cancer Father     thyroid, prostate cancer  . Other Father     mass in stomach  . Hyperlipidemia Maternal Grandmother   . Hypertension Maternal Grandmother   . Hyperlipidemia Maternal Grandfather   . Hypertension Maternal Grandfather   . Hyperlipidemia Paternal Grandmother   . Hypertension Paternal Grandmother   . Hyperlipidemia Paternal Grandfather   . Hypertension Paternal Grandfather   . Heart attack Paternal Grandfather   . Sudden death Neg Hx   . Colon cancer Neg Hx   . Esophageal cancer Neg Hx   . Stomach cancer Neg Hx   . Rectal cancer Neg Hx     Allergies  Allergen Reactions  . Oxycodone-Acetaminophen Hives and Itching  . Zoloft [Sertraline Hcl]     ?weight gain. "made me feel weird"    Current Outpatient Prescriptions on File Prior to Visit  Medication Sig Dispense Refill  . aspirin 81 MG tablet Take 1 tablet (81 mg total) by mouth daily. 90 tablet 1  . atorvastatin (LIPITOR) 10 MG tablet TAKE 1 TABLET DAILY 90 tablet 1  . Blood Glucose  Monitoring Suppl (FREESTYLE LITE) DEVI Use to check blood sugar once a day.  E11.9 1 each 0  . buPROPion (WELLBUTRIN XL) 150 MG 24 hr tablet Take 1 tablet (150 mg total) by mouth daily. 30 tablet 2  . cetirizine (ZYRTEC) 10 MG tablet Take 1 tablet (10 mg total) by mouth daily. 30 tablet 5  . clomiPHENE (CLOMID) 50 MG tablet Take 0.5 tablets by mouth daily.    . diazepam (VALIUM) 10 MG tablet Take 1 tablet (10 mg total) by mouth every 6 (six) hours as needed for anxiety. 2 tablet 0  . diphenhydrAMINE (BENADRYL) 25 mg capsule Take 25-50 mg by mouth at bedtime as needed.    . fluticasone (FLONASE) 50 MCG/ACT nasal spray Place 2 sprays into both nostrils daily. 16 g 5  . FREESTYLE LITE test strip USE AS INSTRUCTED TO CHECK BLOOD SUGAR ONCE DAILY 100 each 1  . gabapentin (NEURONTIN) 600 MG tablet Take 1 tablet (600 mg total) by mouth 3 (three) times daily. Take only on day of procedure 6 tablet 1  . hydrALAZINE (APRESOLINE) 50 MG tablet TAKE 1 TABLET DAILY 90 tablet 1  . JARDIANCE 10 MG TABS tablet TAKE 1 TABLET DAILY 90 tablet 1  . Lancets (FREESTYLE) lancets USE TO CHECK BLOOD SUGAR ONCE DAILY 100 each 1  . liraglutide (VICTOZA) 18 MG/3ML SOPN Inject 0.3 mLs (1.8 mg total) into the skin daily. 27 mL 1  . meloxicam (MOBIC) 7.5 MG tablet TAKE 1 TABLET DAILY 90 tablet 0  . metoprolol succinate (TOPROL XL) 50 MG 24 hr tablet Take 2 tablets (100 mg total) by mouth daily. PLEASE CONTACT OFFICE FOR ADDITIONAL REFILLS 2ND ATTEMPT 135 tablet 0  . omeprazole (PRILOSEC) 20 MG capsule TAKE 2 CAPSULES EVERY MORNING 180 capsule 1  . OVER THE COUNTER MEDICATION MAXIMIZE LIVING.  Maxfit.  Take 1 tablet daily.    Marland Kitchen OVER THE COUNTER MEDICATION OPTIMAL LIVING fish oil.  Take 2 capsules daily.    . pioglitazone (ACTOS) 30 MG tablet TAKE 1 TABLET DAILY 90 tablet 1  . potassium chloride SA (KLOR-CON M20) 20 MEQ tablet Take 1 tablet (20 mEq total) by mouth daily. 90 tablet 1  . Pyridoxine HCl (B-6 PO) Take 1 tablet by  mouth daily.    Marland Kitchen triamcinolone ointment (KENALOG) 0.1 % Apply to red itchy areas only below the face 80 g 3  .  TRIBENZOR 40-10-25 MG TABS TAKE 1 TABLET DAILY 90 tablet 1  . VIAGRA 100 MG tablet TAKE ONE-HALF (1/2) TO 1 TABLET DAILY AS NEEDED 15 tablet 1  . zolpidem (AMBIEN) 10 MG tablet Take 1 tablet (10 mg total) by mouth daily. 30 tablet 0  . [DISCONTINUED] potassium chloride (K-DUR) 10 MEQ tablet Take 1 tablet (10 mEq total) by mouth daily. 30 tablet 1   No current facility-administered medications on file prior to visit.     BP 126/61 (BP Location: Right Arm, Cuff Size: Large)   Pulse 71   Temp 98.1 F (36.7 C) (Oral)   Resp 18   Ht 6\' 1"  (1.854 m)   Wt (!) 360 lb 6.4 oz (163.5 kg)   SpO2 97% Comment: room air  BMI 47.55 kg/m    Objective:   Physical Exam  Constitutional: He is oriented to person, place, and time. He appears well-developed and well-nourished. No distress.  HENT:  Head: Normocephalic and atraumatic.  Cardiovascular: Normal rate and regular rhythm.   No murmur heard. Pulmonary/Chest: Effort normal and breath sounds normal. No respiratory distress. He has no wheezes. He has no rales.  Musculoskeletal: He exhibits no edema.  Neurological: He is alert and oriented to person, place, and time.  Skin: Skin is warm and dry.  Psychiatric: He has a normal mood and affect. His behavior is normal. Thought content normal.          Assessment & Plan:

## 2016-07-24 ENCOUNTER — Encounter: Payer: Self-pay | Admitting: Family

## 2016-07-25 NOTE — Telephone Encounter (Signed)
Rx faxed to [facility] at 7:10am today.

## 2016-07-28 ENCOUNTER — Other Ambulatory Visit: Payer: Self-pay | Admitting: Family

## 2016-07-28 MED ORDER — BUPROPION HCL ER (XL) 150 MG PO TB24: 150.0000 mg | ORAL_TABLET | Freq: Every day | ORAL | 0 refills | Status: DC

## 2016-07-28 NOTE — Telephone Encounter (Signed)
Received request from [facility] for bupropion 75mg  1/2 tablet twice a day. Pt has been taking 150mg  once a day. Request denied and refill sent with 150mg , once daily directions.

## 2016-07-31 ENCOUNTER — Other Ambulatory Visit: Payer: Self-pay | Admitting: Family

## 2016-07-31 NOTE — Telephone Encounter (Signed)
eScribe request from [facility] for refill on Tribenzor 40-10-25 mg Last filled - 02/02/16, #[id number] Last AEX - 07/23/16 Refill sent per [facility] refill protocol/SLS

## 2016-08-07 ENCOUNTER — Other Ambulatory Visit: Payer: Self-pay | Admitting: Family

## 2016-08-07 NOTE — Telephone Encounter (Signed)
Refill sent per [facility] refill protocol/SLS  

## 2016-08-17 ENCOUNTER — Other Ambulatory Visit: Payer: Self-pay | Admitting: Family

## 2016-08-20 ENCOUNTER — Encounter: Payer: Self-pay | Admitting: Gastroenterology

## 2016-08-21 ENCOUNTER — Encounter: Payer: Self-pay | Admitting: Gastroenterology

## 2016-08-25 ENCOUNTER — Ambulatory Visit (INDEPENDENT_AMBULATORY_CARE_PROVIDER_SITE_OTHER): Admitting: Orthopedic Surgery

## 2016-08-25 ENCOUNTER — Encounter (INDEPENDENT_AMBULATORY_CARE_PROVIDER_SITE_OTHER): Payer: Self-pay | Admitting: Orthopedic Surgery

## 2016-08-25 VITALS — Ht 73.0 in | Wt 360.0 lb

## 2016-08-25 DIAGNOSIS — M6702 Short Achilles tendon (acquired), left ankle: Secondary | ICD-10-CM | POA: Insufficient documentation

## 2016-08-25 DIAGNOSIS — E1142 Type 2 diabetes mellitus with diabetic polyneuropathy: Secondary | ICD-10-CM | POA: Diagnosis not present

## 2016-08-25 DIAGNOSIS — M7662 Achilles tendinitis, left leg: Secondary | ICD-10-CM | POA: Diagnosis not present

## 2016-08-25 NOTE — Progress Notes (Signed)
Office Visit Note   Patient: Charles Nash           Date of Birth: Jul 31, 1965           MRN: [id number] Visit Date: 08/25/2016              Requested by: Debbrah Alar, NP [address] [place], [place] [address] PCP: Debbrah Alar, NP  Chief Complaint  Patient presents with  . Left Ankle - Follow-up    Achilles tendon pain for 3 days      HPI: Patient is a 51 year old gentleman type II diabetic who complains of increasing pain of his left Achilles. He states he's had pain in the past. He states that injection before but is not interested in injection he is worried about tendon rupture. Patient states that he stopped wearing his diabetic shoes as he felt there were too heavy is currently wearing flip-flops.  Assessment & Plan: Visit Diagnoses:  1. Achilles tendon contracture, left   2. Achilles tendinitis, left leg   3. Diabetic polyneuropathy associated with type 2 diabetes mellitus ([place])     Plan: Patient was given instructions and demonstrated heel cord stretching U5 times a day 1 minute at a time recommended over-the-counter anti-inflammatories and recommended a fracture boot and until symptoms resolve.  Follow-Up Instructions: Return if symptoms worsen or fail to improve.   Ortho Exam  Patient is alert, oriented, no adenopathy, well-dressed, normal affect, normal respiratory effort. Examination patient has a normal gait. BMI greater than 40. He has a good dorsalis pedis pulse. He has no venous stasis ulcers. Patient has significant heel cord contracture with dorsiflexion about 10 short of neutral with his knee extended. There are no plantar ulcers.  Imaging: No results found.  Labs: Lab Results  Component Value Date   HGBA1C 6.8 (H) 04/03/2016   HGBA1C 6.6 (H) 01/01/2016   HGBA1C 7.3 (H) 07/31/2015   LABURIC 7.4 11/17/2012   LABORGA NO GROWTH 10/19/2014    Orders:  No orders of the defined types were placed in this  encounter.  No orders of the defined types were placed in this encounter.    Procedures: No procedures performed  Clinical Data: No additional findings.  ROS:  All other systems negative, except as noted in the HPI. Review of Systems  Objective: Vital Signs: Ht 6\' 1"  (1.854 m)   Wt (!) 360 lb (163.3 kg)   BMI 47.50 kg/m   Specialty Comments:  No specialty comments available.  PMFS History: Patient Active Problem List   Diagnosis Date Noted  . Diabetic polyneuropathy associated with type 2 diabetes mellitus ([place]) 08/25/2016  . Achilles tendinitis, left leg 08/25/2016  . Achilles tendon contracture, left 08/25/2016  . Xerosis of skin 04/22/2016  . Chronic urticaria 03/05/2016  . Sacroiliac joint dysfunction of both sides 01/01/2016  . Anxiety state 08/20/2015  . 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 Patellofemoral disorder 05/15/2015  . Current use of beta blocker 05/15/2015  . Genital herpes 10/25/2014  . Depression 10/05/2014  . Osteoarthritis of left knee 08/15/2014  . 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 Sciatica 04/22/2014  . Osteoarthritis of right knee 02/06/2014  . Allergy to mold 12/13/2013  . Weight gain, abnormal 11/08/2013  . PTSD (post-traumatic stress disorder) 07/05/2013  . Multinodular goiter 01/19/2013  . GERD (gastroesophageal reflux disease) 08/09/2012  . Dermographia 12/29/2011  . Neck pain 11/12/2011  . Fatty liver 08/06/2011  . Cervical disc  disease 07/28/2011  . Preventative health care 07/28/2011  . Erectile dysfunction 05/23/2011  . Elevated liver function tests 05/20/2011  . Type II diabetes mellitus, well controlled ([place]) 05/19/2011  . Essential hypertension 05/19/2011  . Hyperlipidemia 05/19/2011  . Morbid obesity ([place]) 05/19/2011  . Allergic rhinitis 05/19/2011  . Obstructive sleep apnea treated with continuous positive airway pressure (CPAP) 05/19/2011   Past Medical History:  Diagnosis Date    . ADHD (attention deficit hyperactivity disorder)   . Allergy   . Arthritis   . Bilateral varicoceles   . Diabetes mellitus   . Fatty liver 08/06/2011  . Genital herpes 10/25/2014  . GERD (gastroesophageal reflux disease)   . Hyperlipidemia   . Hypertension   . OSA (obstructive sleep apnea)   . Personal history of colonic polyps   . Plantar fasciitis   . PTSD (post-traumatic stress disorder)     Family History  Problem Relation Age of Onset  . Diabetes Mother   . Hypertension Mother   . Arthritis Mother   . Cancer Mother        uterine and breast cancer, sarcoma of the brain  . Hyperlipidemia Father   . Arthritis Father   . Cancer Father        thyroid, prostate cancer  . Other Father        mass in stomach  . Hyperlipidemia Maternal Grandmother   . Hypertension Maternal Grandmother   . Hyperlipidemia Maternal Grandfather   . Hypertension Maternal Grandfather   . Hyperlipidemia Paternal Grandmother   . Hypertension Paternal Grandmother   . Hyperlipidemia Paternal Grandfather   . Hypertension Paternal Grandfather   . Heart attack Paternal Grandfather   . Sudden death Neg Hx   . Colon cancer Neg Hx   . Esophageal cancer Neg Hx   . Stomach cancer Neg Hx   . Rectal cancer Neg Hx     Past Surgical History:  Procedure Laterality Date  . ANKLE SURGERY    . DOPPLER ECHOCARDIOGRAPHY  2010  . FOOT SURGERY    . KNEE SURGERY  08/04/08   Right knee-- medial meniscus repair, attenuation anterior cruciate [name] MD [facility])   . NM MYOVIEW LTD  2010  . PLANTAR FASCIA RELEASE    . SHOULDER SURGERY    . VARICOCELE EXCISION     Social History   Occupational History  . [employer]   Social History Main Topics  . Smoking status: Never Smoker  . Smokeless tobacco: Never Used  . Alcohol use No     Comment: occasional wine  . Drug use: No  . Sexual activity: Not on file

## 2016-08-29 ENCOUNTER — Encounter: Payer: Self-pay | Admitting: Physical Medicine & Rehabilitation

## 2016-08-29 ENCOUNTER — Encounter: Attending: Physical Medicine & Rehabilitation

## 2016-08-29 ENCOUNTER — Ambulatory Visit (HOSPITAL_BASED_OUTPATIENT_CLINIC_OR_DEPARTMENT_OTHER): Admitting: Physical Medicine & Rehabilitation

## 2016-08-29 VITALS — BP 129/77 | HR 75 | Resp 14

## 2016-08-29 DIAGNOSIS — M47816 Spondylosis without myelopathy or radiculopathy, lumbar region: Secondary | ICD-10-CM

## 2016-08-29 DIAGNOSIS — M6702 Short Achilles tendon (acquired), left ankle: Secondary | ICD-10-CM | POA: Diagnosis not present

## 2016-08-29 MED ORDER — TRAMADOL HCL 50 MG PO TABS: 50.0000 mg | ORAL_TABLET | Freq: Two times a day (BID) | ORAL | 1 refills | Status: DC | PRN

## 2016-08-29 NOTE — Patient Instructions (Signed)
Plan repeat RFA

## 2016-08-29 NOTE — Progress Notes (Signed)
Subjective:    Patient ID: Charles Nash, male    DOB: 1965-11-11, 51 y.o.   MRN: [id number]  HPI Left L3-4-5 MB RFA  Right L3-4-5 MB RFA - Starting to have increased pain on Right side Pain is interfering with bed mobility.  Patient is planning to undergo gastric sleeve in either June or July He is no longer taking meloxicam, he is worried about the side effects. Past medical history significant for diabetes  He is currently using a left cam walker boot for Achilles contracture. Seen by orthopedics Pain Inventory Average Pain 4 Pain Right Now 4 My pain is intermittent  In the last 24 hours, has pain interfered with the following? General activity 0 Relation with others 0 Enjoyment of life 0 What TIME of day is your pain at its worst? varies Sleep (in general) Fair  Pain is worse with: walking, bending, standing and some activites Pain improves with: heat/ice, therapy/exercise, medication and injections Relief from Meds: n/a  Mobility walk without assistance how many minutes can you walk? 5  Function retired I need assistance with the following:  dressing  Neuro/Psych spasms depression anxiety  Prior Studies Any changes since last visit?  no  Physicians involved in your care Any changes since last visit?  no   Family History  Problem Relation Age of Onset  . Diabetes Mother   . Hypertension Mother   . Arthritis Mother   . Cancer Mother        uterine and breast cancer, sarcoma of the brain  . Hyperlipidemia Father   . Arthritis Father   . Cancer Father        thyroid, prostate cancer  . Other Father        mass in stomach  . Hyperlipidemia Maternal Grandmother   . Hypertension Maternal Grandmother   . Hyperlipidemia Maternal Grandfather   . Hypertension Maternal Grandfather   . Hyperlipidemia Paternal Grandmother   . Hypertension Paternal Grandmother   . Hyperlipidemia Paternal Grandfather   . Hypertension Paternal Grandfather   .  Heart attack Paternal Grandfather   . Sudden death Neg Hx   . Colon cancer Neg Hx   . Esophageal cancer Neg Hx   . Stomach cancer Neg Hx   . Rectal cancer Neg Hx    Social History   Social History  . Marital status: Married    Spouse name: N/A  . Number of children: 2  . Years of education: N/A   Occupational History  . [employer]   Social History Main Topics  . Smoking status: Never Smoker  . Smokeless tobacco: Never Used  . Alcohol use No     Comment: occasional wine  . Drug use: No  . Sexual activity: Not Asked   Other Topics Concern  . None   Social History Narrative   Regular exercise:  No   Caffeine use:  2--32oz cups daily         Past Surgical History:  Procedure Laterality Date  . ANKLE SURGERY    . DOPPLER ECHOCARDIOGRAPHY  2010  . FOOT SURGERY    . KNEE SURGERY  08/04/08   Right knee-- medial meniscus repair, attenuation anterior cruciate [name] MD [facility])   . NM MYOVIEW LTD  2010  . PLANTAR FASCIA RELEASE    . SHOULDER SURGERY    . VARICOCELE EXCISION     Past Medical History:  Diagnosis Date  . ADHD (attention deficit hyperactivity  disorder)   . Allergy   . Arthritis   . Bilateral varicoceles   . Diabetes mellitus   . Fatty liver 08/06/2011  . Genital herpes 10/25/2014  . GERD (gastroesophageal reflux disease)   . Hyperlipidemia   . Hypertension   . OSA (obstructive sleep apnea)   . Personal history of colonic polyps   . Plantar fasciitis   . PTSD (post-traumatic stress disorder)    BP 129/77   Pulse 75   Resp 14   SpO2 94%   Opioid Risk Score:   Fall Risk Score:  `1  Depression screen PHQ 2/9  Depression screen [facility] 2/9 07/23/2016 01/01/2016 07/23/2015 06/25/2015 06/04/2015 05/15/2015 04/26/2015  Decreased Interest 2 0 0 1 1 1  0  Down, Depressed, Hopeless 0 0 0 1 1 1  0  PHQ - 2 Score 2 0 0 2 2 2  0  Altered sleeping 3 - - - - 3 -  Tired, decreased energy 3 - - - - 3 -  Change in appetite 3 -  - - - 3 -  Feeling bad or failure about yourself  0 - - - - 0 -  Trouble concentrating 3 - - - - 3 -  Moving slowly or fidgety/restless 0 - - - - 0 -  Suicidal thoughts 0 - - - - 0 -  PHQ-9 Score 14 - - - - 14 -  Difficult doing work/chores - - - - - Extremely dIfficult -  Some recent data might be hidden    Review of Systems  Constitutional: Positive for unexpected weight change.  HENT: Negative.   Eyes: Negative.   Respiratory: Positive for apnea.   Cardiovascular: Negative.   Endocrine:       High blood sugar  Genitourinary: Negative.   Musculoskeletal: Positive for back pain.       Spasms  Skin: Negative.   Allergic/Immunologic: Negative.   Neurological: Negative.   Hematological: Negative.   Psychiatric/Behavioral: Positive for dysphoric mood. The patient is nervous/anxious.   All other systems reviewed and are negative.      Objective:   Physical Exam  Constitutional: He is oriented to person, place, and time. He appears well-developed and well-nourished.  HENT:  Head: Normocephalic and atraumatic.  Eyes: Conjunctivae and EOM are normal. Pupils are equal, round, and reactive to light.  Neck: Normal range of motion.  Neurological: He is alert and oriented to person, place, and time.  Psychiatric: He has a normal mood and affect.  Nursing note and vitals reviewed.         Assessment & Plan:  1. Lumbar spondylosis without myelopathy Will need repeat RFA next month. Start on the right side, no symptoms on the left side, yet  In the meantime, start tramadol 50 mg twice a day  2. Achilles contracture. Follow-up with orthopedics  3. Morbid obesity. Will undergo gastric sleeve surgery at [facility] later this summer

## 2016-08-30 ENCOUNTER — Other Ambulatory Visit: Payer: Self-pay | Admitting: Family

## 2016-09-02 NOTE — Telephone Encounter (Signed)
Denial sent to [facility] for Meloxicam refill. Office note with Dr Elnita Maxwell says pt had stopped due to concerns about side effects and he was started on tramadol.

## 2016-09-03 ENCOUNTER — Emergency Department (HOSPITAL_BASED_OUTPATIENT_CLINIC_OR_DEPARTMENT_OTHER)
Admission: EM | Admit: 2016-09-03 | Discharge: 2016-09-03 | Disposition: A | Discharge status: Home / Self Care | Attending: Physician Assistant | Admitting: Physician Assistant

## 2016-09-03 ENCOUNTER — Encounter (HOSPITAL_BASED_OUTPATIENT_CLINIC_OR_DEPARTMENT_OTHER): Payer: Self-pay

## 2016-09-03 ENCOUNTER — Emergency Department (HOSPITAL_BASED_OUTPATIENT_CLINIC_OR_DEPARTMENT_OTHER)

## 2016-09-03 DIAGNOSIS — E119 Type 2 diabetes mellitus without complications: Secondary | ICD-10-CM | POA: Diagnosis not present

## 2016-09-03 DIAGNOSIS — Z79899 Other long term (current) drug therapy: Secondary | ICD-10-CM | POA: Diagnosis not present

## 2016-09-03 DIAGNOSIS — J189 Pneumonia, unspecified organism: Secondary | ICD-10-CM | POA: Diagnosis not present

## 2016-09-03 DIAGNOSIS — I10 Essential (primary) hypertension: Secondary | ICD-10-CM | POA: Insufficient documentation

## 2016-09-03 DIAGNOSIS — R05 Cough: Secondary | ICD-10-CM | POA: Diagnosis present

## 2016-09-03 DIAGNOSIS — Z7984 Long term (current) use of oral hypoglycemic drugs: Secondary | ICD-10-CM | POA: Diagnosis not present

## 2016-09-03 MED ORDER — ALBUTEROL SULFATE HFA 108 (90 BASE) MCG/ACT IN AERS
2.0000 | INHALATION_SPRAY | Freq: Once | RESPIRATORY_TRACT | Status: AC
  Administered 2016-09-03: 2 via RESPIRATORY_TRACT
  Filled 2016-09-03: qty 6.7

## 2016-09-03 MED ORDER — AEROCHAMBER PLUS FLO-VU MEDIUM MISC
1.0000 | Freq: Once | Status: AC
  Administered 2016-09-03: 1
  Filled 2016-09-03: qty 1

## 2016-09-03 MED ORDER — AZITHROMYCIN 250 MG PO TABS: 250.0000 mg | ORAL_TABLET | Freq: Once | ORAL | 0 refills | Status: AC

## 2016-09-03 MED ORDER — IBUPROFEN 800 MG PO TABS
800.0000 mg | ORAL_TABLET | Freq: Once | ORAL | Status: AC
  Administered 2016-09-03: 800 mg via ORAL
  Filled 2016-09-03: qty 1

## 2016-09-03 MED ORDER — AZITHROMYCIN 250 MG PO TABS
500.0000 mg | ORAL_TABLET | Freq: Once | ORAL | Status: AC
  Administered 2016-09-03: 500 mg via ORAL
  Filled 2016-09-03: qty 2

## 2016-09-03 MED ORDER — GUAIFENESIN-CODEINE 100-10 MG/5ML PO SOLN: 5.0000 mL | Freq: Four times a day (QID) | ORAL | 0 refills | Status: DC | PRN

## 2016-09-03 MED ORDER — BENZONATATE 100 MG PO CAPS: 100.0000 mg | ORAL_CAPSULE | Freq: Three times a day (TID) | ORAL | 0 refills | Status: DC | PRN

## 2016-09-03 MED ORDER — BENZONATATE 100 MG PO CAPS
200.0000 mg | ORAL_CAPSULE | Freq: Once | ORAL | Status: AC
  Administered 2016-09-03: 200 mg via ORAL
  Filled 2016-09-03: qty 2

## 2016-09-03 NOTE — ED Triage Notes (Signed)
C/o flu like sx x 3 days-NAD-steady gait 

## 2016-09-03 NOTE — Discharge Instructions (Signed)
Please return with any concerns. °

## 2016-09-03 NOTE — ED Provider Notes (Signed)
[place] DEPT [facility] Provider Note   CSN: [id number] Arrival date & time: 09/03/16  1916  By signing my name below, I, Theresia Bough, attest that this documentation has been prepared under the direction and in the presence of Jonathan Kirkendoll, Fredia Sorrow, MD. Electronically Signed: Theresia Bough, ED Scribe. 09/03/16. 8:15 PM.  History   Chief Complaint Chief Complaint  Patient presents with  . Cough   The history is provided by the patient. No language interpreter was used.   HPI Comments: Quentavious Rittenhouse is a 51 y.o. male who presents to the Emergency Department complaining of a persistent, moderate cough onset 3 days ago.  Pt states he has pain in his chest when he coughs. Pt reports associated wheezing, congestion, "scratchy throat", and fever. He has a fever of 101.1 degrees F in the ED. No treatments tried prior to arrival in the ED. Pt denies a sore throat or any other complaints at this time.  Past Medical History:  Diagnosis Date  . ADHD (attention deficit hyperactivity disorder)   . Allergy   . Arthritis   . Bilateral varicoceles   . Diabetes mellitus   . Fatty liver 08/06/2011  . Genital herpes 10/25/2014  . GERD (gastroesophageal reflux disease)   . Hyperlipidemia   . Hypertension   . OSA (obstructive sleep apnea)   . Personal history of colonic polyps   . Plantar fasciitis   . PTSD (post-traumatic stress disorder)     Patient Active Problem List   Diagnosis Date Noted  . Diabetic polyneuropathy associated with type 2 diabetes mellitus ([place]) 08/25/2016  . Achilles tendinitis, left leg 08/25/2016  . Achilles tendon contracture, left 08/25/2016  . Xerosis of skin 04/22/2016  . Chronic urticaria 03/05/2016  . Sacroiliac joint dysfunction of both sides 01/01/2016  . Anxiety state 08/20/2015  . 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 Patellofemoral disorder 05/15/2015  . Current use of beta blocker 05/15/2015  .  Genital herpes 10/25/2014  . Depression 10/05/2014  . Osteoarthritis of left knee 08/15/2014  . 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 Sciatica 04/22/2014  . Osteoarthritis of right knee 02/06/2014  . Allergy to mold 12/13/2013  . Weight gain, abnormal 11/08/2013  . PTSD (post-traumatic stress disorder) 07/05/2013  . Multinodular goiter 01/19/2013  . GERD (gastroesophageal reflux disease) 08/09/2012  . Dermographia 12/29/2011  . Neck pain 11/12/2011  . Fatty liver 08/06/2011  . Cervical disc disease 07/28/2011  . Preventative health care 07/28/2011  . Erectile dysfunction 05/23/2011  . Elevated liver function tests 05/20/2011  . Type II diabetes mellitus, well controlled ([place]) 05/19/2011  . Essential hypertension 05/19/2011  . Hyperlipidemia 05/19/2011  . Morbid obesity ([place]) 05/19/2011  . Allergic rhinitis 05/19/2011  . Obstructive sleep apnea treated with continuous positive airway pressure (CPAP) 05/19/2011    Past Surgical History:  Procedure Laterality Date  . ANKLE SURGERY    . DOPPLER ECHOCARDIOGRAPHY  2010  . FOOT SURGERY    . KNEE SURGERY  08/04/08   Right knee-- medial meniscus repair, attenuation anterior cruciate [name] MD [facility])   . NM MYOVIEW LTD  2010  . PLANTAR FASCIA RELEASE    . SHOULDER SURGERY    . VARICOCELE EXCISION         Home Medications    Prior to Admission medications   Medication Sig Start Date End Date Taking? Authorizing Provider  atorvastatin (LIPITOR) 10 MG tablet TAKE 1 TABLET DAILY 08/18/16   Debbrah Alar, NP  Blood Glucose Monitoring Suppl (FREESTYLE LITE) DEVI Use to check blood sugar once a day.  E11.9 09/13/14   Debbrah Alar, NP  buPROPion (WELLBUTRIN XL) 150 MG 24 hr tablet Take 1 tablet (150 mg total) by mouth daily. 07/28/16   Debbrah Alar, NP  cetirizine (ZYRTEC) 10 MG tablet Take 1 tablet (10 mg total) by mouth daily. 06/26/15   Leda Roys, MD  cholecalciferol (VITAMIN D) 1000 units tablet Take  1,000 Units by mouth daily.    [provider]  clomiPHENE (CLOMID) 50 MG tablet Take 0.5 tablets by mouth daily. 05/04/15   [provider]  diazepam (VALIUM) 10 MG tablet Take 1 tablet (10 mg total) by mouth every 6 (six) hours as needed for anxiety. 01/15/16   Kirsteins, Luanna Salk, MD  diphenhydrAMINE (BENADRYL) 25 mg capsule Take 25-50 mg by mouth at bedtime as needed.    [provider]  ferrous sulfate 325 (65 FE) MG tablet Take 325 mg by mouth 2 (two) times daily with a meal.    [provider]  fluticasone (FLONASE) 50 MCG/ACT nasal spray Place 2 sprays into both nostrils daily. 06/26/15   Leda Roys, MD  FREESTYLE LITE test strip USE AS INSTRUCTED TO CHECK BLOOD SUGAR ONCE DAILY 04/02/15   Debbrah Alar, NP  gabapentin (NEURONTIN) 600 MG tablet Take 1 tablet (600 mg total) by mouth 3 (three) times daily. Take only on day of procedure 02/25/16   Charlett Blake, MD  hydrALAZINE (APRESOLINE) 50 MG tablet TAKE 1 TABLET DAILY 06/24/16   Debbrah Alar, NP  JARDIANCE 10 MG TABS tablet TAKE 1 TABLET DAILY 04/25/16   Debbrah Alar, NP  KLOR-CON M20 20 MEQ tablet TAKE 1 TABLET DAILY 08/07/16   Debbrah Alar, NP  Lancets (FREESTYLE) lancets USE TO CHECK BLOOD SUGAR ONCE DAILY 04/02/15   Debbrah Alar, NP  liraglutide (VICTOZA) 18 MG/3ML SOPN Inject 0.3 mLs (1.8 mg total) into the skin daily. 02/13/16   Debbrah Alar, NP  metoprolol succinate (TOPROL XL) 50 MG 24 hr tablet Take 2 tablets (100 mg total) by mouth daily. PLEASE CONTACT OFFICE FOR ADDITIONAL REFILLS 2ND ATTEMPT 12/18/15   Pixie Casino, MD  Olmesartan-Amlodipine-HCTZ 40-10-25 MG TABS TAKE 1 TABLET DAILY 07/31/16   Debbrah Alar, NP  omeprazole (PRILOSEC) 20 MG capsule TAKE 2 CAPSULES EVERY MORNING 06/10/16   Debbrah Alar, NP  OVER THE COUNTER MEDICATION MAXIMIZE LIVING.  Maxfit.  Take 1 tablet daily.    [provider]  OVER THE COUNTER MEDICATION  OPTIMAL LIVING fish oil.  Take 2 capsules daily.    [provider]  pioglitazone (ACTOS) 30 MG tablet TAKE 1 TABLET DAILY 06/11/16   Debbrah Alar, NP  Pyridoxine HCl (B-6 PO) Take 1 tablet by mouth daily.    [provider]  traMADol (ULTRAM) 50 MG tablet Take 1 tablet (50 mg total) by mouth every 12 (twelve) hours as needed. 08/29/16   Kirsteins, Luanna Salk, MD  triamcinolone ointment (KENALOG) 0.1 % Apply to red itchy areas only below the face 04/22/16   Charlies Silvers, MD  VIAGRA 100 MG tablet TAKE ONE-HALF (1/2) TO 1 TABLET DAILY AS NEEDED 04/25/16   Debbrah Alar, NP  vitamin C (ASCORBIC ACID) 500 MG tablet Take 500 mg by mouth daily.    [provider]  zolpidem (AMBIEN) 10 MG tablet Take 1 tablet (10 mg total) by mouth daily. 07/23/16   Debbrah Alar, NP    Family History Family History  Problem Relation  Age of Onset  . Diabetes Mother   . Hypertension Mother   . Arthritis Mother   . Cancer Mother        uterine and breast cancer, sarcoma of the brain  . Hyperlipidemia Father   . Arthritis Father   . Cancer Father        thyroid, prostate cancer  . Other Father        mass in stomach  . Hyperlipidemia Maternal Grandmother   . Hypertension Maternal Grandmother   . Hyperlipidemia Maternal Grandfather   . Hypertension Maternal Grandfather   . Hyperlipidemia Paternal Grandmother   . Hypertension Paternal Grandmother   . Hyperlipidemia Paternal Grandfather   . Hypertension Paternal Grandfather   . Heart attack Paternal Grandfather   . Sudden death Neg Hx   . Colon cancer Neg Hx   . Esophageal cancer Neg Hx   . Stomach cancer Neg Hx   . Rectal cancer Neg Hx     Social History Social History  Substance Use Topics  . Smoking status: Never Smoker  . Smokeless tobacco: Never Used  . Alcohol use No     Comment: occasional wine     Allergies   Oxycodone-acetaminophen and Zoloft [sertraline hcl]   Review of Systems Review of  Systems  Constitutional: Positive for fever.  HENT: Positive for congestion. Negative for sore throat.   Respiratory: Positive for cough and wheezing.   All other systems reviewed and are negative.    Physical Exam Updated Vital Signs BP 136/80 (BP Location: Left Arm)   Pulse 88   Temp 99.1 F (37.3 C)   Resp 20   Ht 6\' 1"  (1.854 m)   Wt (!) 161 kg (355 lb)   SpO2 94%   BMI 46.84 kg/m   Physical Exam  Constitutional: He is oriented to person, place, and time. He appears well-developed and well-nourished.  HENT:  Head: Normocephalic and atraumatic.  Mouth/Throat: No posterior oropharyngeal erythema.  Eyes: Conjunctivae are normal. Pupils are equal, round, and reactive to light.  Cardiovascular: Normal rate, regular rhythm and normal heart sounds.  Exam reveals no gallop and no friction rub.   No murmur heard. Pulmonary/Chest: Effort normal and breath sounds normal. No respiratory distress. He has no wheezes.  Neurological: He is alert and oriented to person, place, and time.  Skin: Skin is warm and dry.  Psychiatric: He has a normal mood and affect.  Nursing note and vitals reviewed.    ED Treatments / Results  DIAGNOSTIC STUDIES: Oxygen Saturation is 97% on RA, normal by my interpretation.   COORDINATION OF CARE: 8:07 PM-Discussed next steps with pt including hydration, Tylenol for his fever and review the results of his Chest XR. Pt verbalized understanding and is agreeable with the plan.   Labs (all labs ordered are listed, but only abnormal results are displayed) Labs Reviewed - No data to display  EKG  EKG Interpretation None       Radiology Dg Chest 2 View  Result Date: 09/03/2016 CLINICAL DATA:  Fever cough and chest pain for 3 days EXAM: CHEST  2 VIEW COMPARISON:  None. FINDINGS: The heart size and mediastinal contours are within normal limits. Both lungs are clear. The visualized skeletal structures are unremarkable. IMPRESSION: No active  cardiopulmonary disease. Electronically Signed   By: Donavan Foil M.D.   On: 09/03/2016 19:57    Procedures Procedures (including critical care time)  Medications Ordered in ED Medications  ibuprofen (ADVIL,MOTRIN) tablet 800 mg (800  mg Oral Given 09/03/16 1933)  albuterol (PROVENTIL HFA;VENTOLIN HFA) 108 (90 Base) MCG/ACT inhaler 2 puff (2 puffs Inhalation Given 09/03/16 2103)  AEROCHAMBER PLUS FLO-VU MEDIUM MISC 1 each (1 each Other Given 09/03/16 2115)  azithromycin (ZITHROMAX) tablet 500 mg (500 mg Oral Given 09/03/16 2107)     Initial Impression / Assessment and Plan / ED Course  I have reviewed the triage vital signs and the nursing notes.  Pertinent labs & imaging results that were available during my care of the patient were reviewed by me and considered in my medical decision making (see chart for details).    I personally performed the services described in this documentation, which was scribed in my presence. The recorded information has been reviewed and is accurate.   Patient's very well-appearing obese 51 year old male presenting with fever and cough. Patient has no other symptoms except cough. I suspect that this represents viral infection versus pneumonia. We'll get chest x-ray. Given fever and symptoms will treat for CAP.  Will give supportive cough care.   Final Clinical Impressions(s) / ED Diagnoses   Final diagnoses:  None    New Prescriptions New Prescriptions   No medications on file     Macarthur Critchley, MD 09/03/16 2125

## 2016-09-07 IMAGING — MR MR KNEE*L* W/O CM
6 series · 40 of 40 positions shown · non-contrast
Comparison: Radiographs 05/09/2014.

CLINICAL DATA: Anterolateral knee pain with popping for 2 months.
No acute injury or prior relevant surgery. Initial encounter.

EXAM:
MRI OF THE LEFT KNEE WITHOUT CONTRAST
TECHNIQUE: Multiplanar, multisequence MR imaging of the knee was performed. No
intravenous contrast was administered.

[Series 4: PD fat-sat · axial · 4.0mm · 0.70mm/px · z∈[-48,+65]mm · 8 of 24 slices shown (1 of 3)]
[im 1/24]
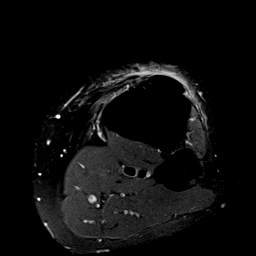
[im 4/24]
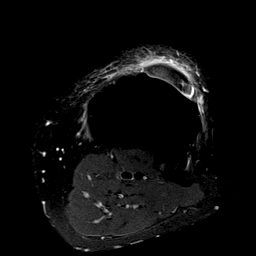
[im 7/24]
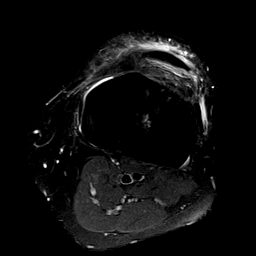
[im 10/24]
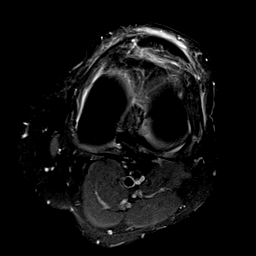
[im 14/24]
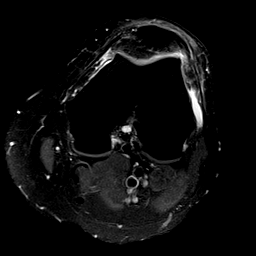
[im 17/24]
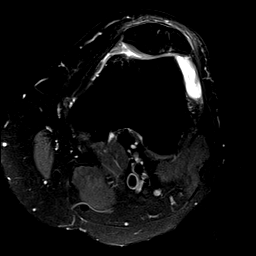
[im 20/24]
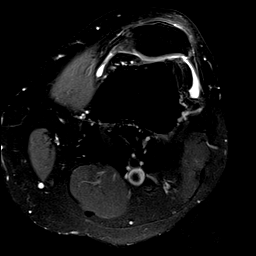
[im 24/24]
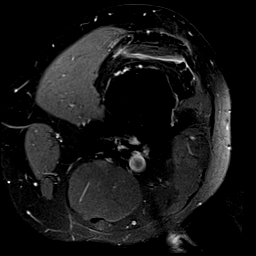

[Series 5: T1 · coronal · 4.0mm · 0.70mm/px · 7 of 25 slices shown]
[im 1/25]
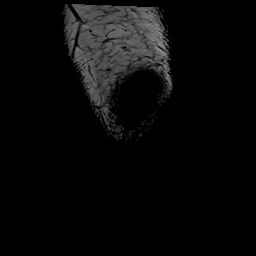
[im 5/25]
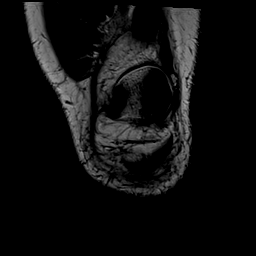
[im 9/25]
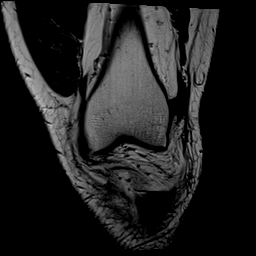
[im 13/25]
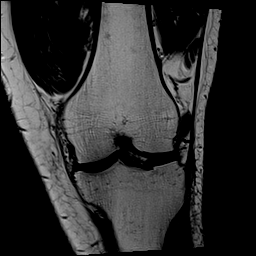
[im 17/25]
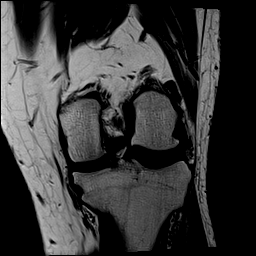
[im 21/25]
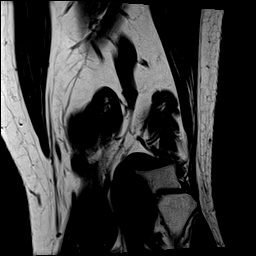
[im 25/25]
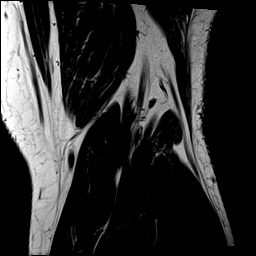

[Series 6: PD fat-sat · coronal · 4.0mm · 0.70mm/px · 7 of 25 slices shown (2 of 3)]
[im 1/25]
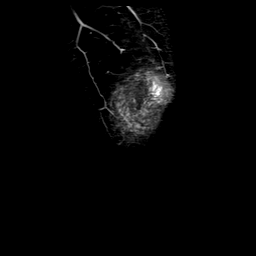
[im 5/25]
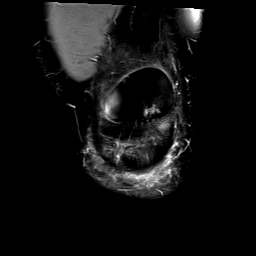
[im 9/25]
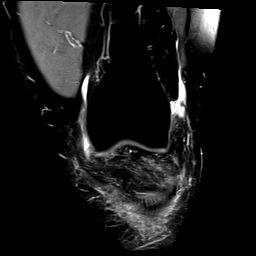
[im 13/25]
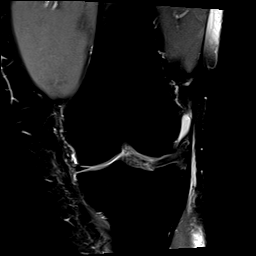
[im 17/25]
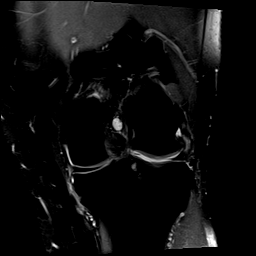
[im 21/25]
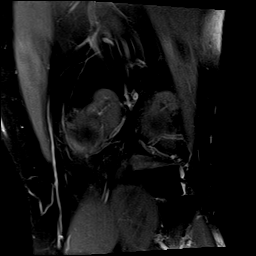
[im 25/25]
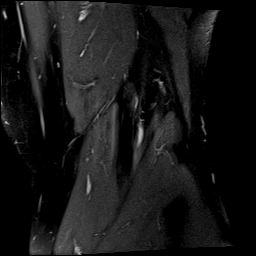

[Series 7: T2 fat-sat · coronal · 4.0mm · 0.70mm/px · 7 of 25 slices shown]
[im 1/25]
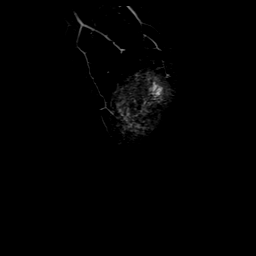
[im 5/25]
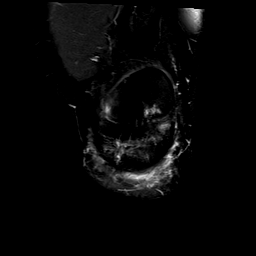
[im 9/25]
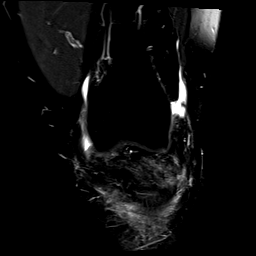
[im 13/25]
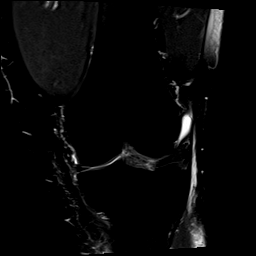
[im 17/25]
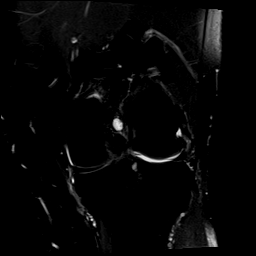
[im 21/25]
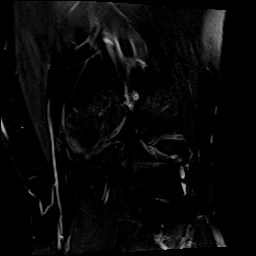
[im 25/25]
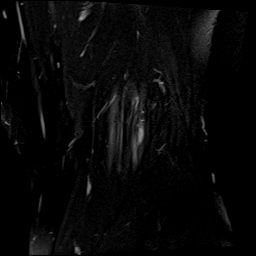

[Series 8: PD fat-sat · sagittal · 4.0mm · 0.70mm/px · 7 of 23 slices shown (3 of 3)]
[im 1/23]
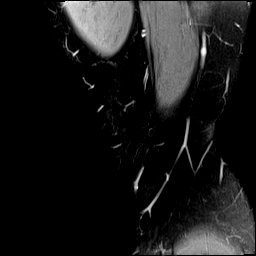
[im 4/23]
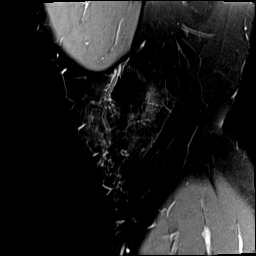
[im 8/23]
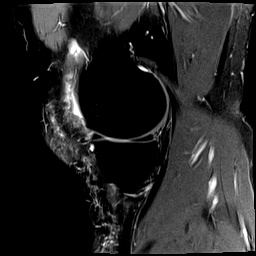
[im 12/23]
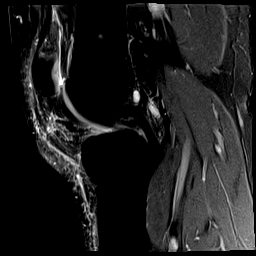
[im 15/23]
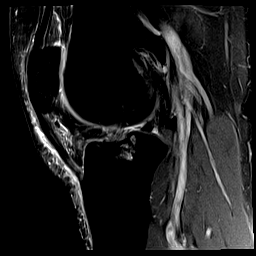
[im 19/23]
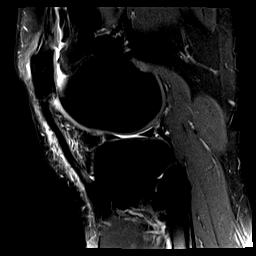
[im 23/23]
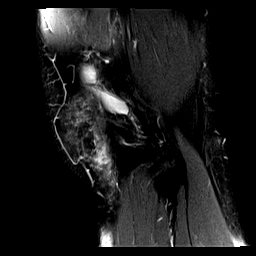

[Series 9: PD · coronal · 2.0mm · 0.62mm/px · 4 of 15 slices shown]
[im 1/15]
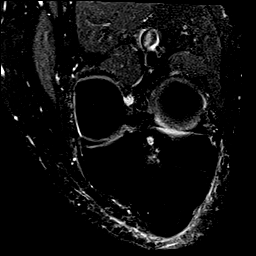
[im 5/15]
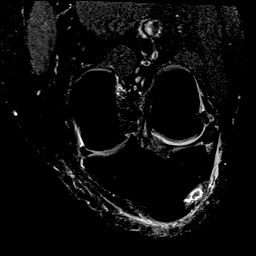
[im 10/15]
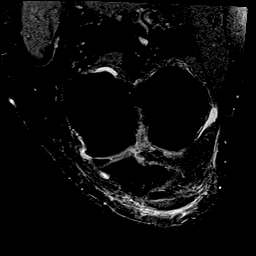
[im 15/15]
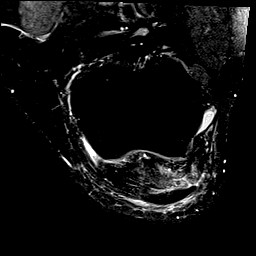

[40 of 40 positions shown; findings below may reference images not displayed]

FINDINGS: MENISCI

Medial meniscus:  Intact with normal morphology.

Lateral meniscus:  Intact with normal morphology.

LIGAMENTS

Cruciates: Intact. There is mild cruciate ligament mucoid
degeneration with intercondylar notch cyst formation. There is also
intraosseous cyst formation centrally in the proximal tibia.

Collaterals:  Intact.  Trace fluid in the pes anserine bursa.

CARTILAGE

Patellofemoral: Moderate patellofemoral degenerative changes with
chondral thinning and subchondral cyst formation in the lateral
patellar facet. There is mild lateral tilting of the patella.

Medial:  Relatively preserved with minimal osteophyte formation.

Lateral:  Relatively preserved with minimal osteophyte formation.

Joint:  Small joint effusion.  No evidence of loose body.

Popliteal Fossa:  Unremarkable. No significant Baker's cyst.

Extensor Mechanism: Intact. There is diffuse patellar tendinosis
with prominent prepatellar and infrapatellar edema and ill-defined
fluid.

Bones:  No significant extra-articular osseous findings.
IMPRESSION: 1. Prominent edema and ill-defined fluid within the prepatellar and
infrapatellar fat most consistent with bursitis. Mild associated
patellar tendinosis.
2. Moderate patellofemoral degenerative changes. Minimal chondral
thinning in the medial and lateral compartments. No acute osseous
findings.
3. Intact menisci, cruciate and collateral ligaments. Possible mild
cruciate ligament mucoid degeneration.

## 2016-09-09 ENCOUNTER — Ambulatory Visit (INDEPENDENT_AMBULATORY_CARE_PROVIDER_SITE_OTHER): Admitting: Family

## 2016-09-09 ENCOUNTER — Encounter: Payer: Self-pay | Admitting: Family

## 2016-09-09 VITALS — BP 126/78 | HR 71 | Temp 98.4°F | Resp 18 | Ht 73.0 in | Wt 351.8 lb

## 2016-09-09 DIAGNOSIS — J208 Acute bronchitis due to other specified organisms: Secondary | ICD-10-CM

## 2016-09-09 MED ORDER — FLUTICASONE PROPIONATE 50 MCG/ACT NA SUSP: 2.0000 | Freq: Every day | NASAL | 6 refills | Status: DC

## 2016-09-09 NOTE — Patient Instructions (Addendum)
Restart flonase and zyrtec for your allergy symptoms. Continue albuterol every 6 hours as needed.  Call if cough worsens, if you develop fever/shortness of breath or if not improved in next 1-2 weeks.

## 2016-09-09 NOTE — Progress Notes (Signed)
Subjective:    Patient ID: Charles Nash., male    DOB: 1965-07-14, 51 y.o.   MRN: [id number]  HPI  [name] is a 51 yr old male who presents today for ER follow up. ER note is reviewed.  He was evaluated on 09/04/15 with chief complaint of cough. He was noted to be febrile with temperature of 101.1 in the ED.  CXR was negative for pneumonia.  He was treated empirically for pneumonia due to fever (was rx'd zpak which he completed).  Today notes some right sided ear pain.  Cough is improved but persistent. Cough is described as dry.  Does report some post-nasal drip.  Not currently using is zyrtec of flonase.   Review of Systems    see HPI  Past Medical History:  Diagnosis Date  . ADHD (attention deficit hyperactivity disorder)   . Allergy   . Arthritis   . Bilateral varicoceles   . Diabetes mellitus   . Fatty liver 08/06/2011  . Genital herpes 10/25/2014  . GERD (gastroesophageal reflux disease)   . Hyperlipidemia   . Hypertension   . OSA (obstructive sleep apnea)   . Personal history of colonic polyps   . Plantar fasciitis   . PTSD (post-traumatic stress disorder)      Social History   Social History  . Marital status: Divorced    Spouse name: N/A  . Number of children: 2  . Years of education: N/A   Occupational History  . [employer]   Social History Main Topics  . Smoking status: Never Smoker  . Smokeless tobacco: Never Used  . Alcohol use No     Comment: occasional wine  . Drug use: No  . Sexual activity: Not on file   Other Topics Concern  . Not on file   Social History Narrative   Regular exercise:  No   Caffeine use:  2--32oz cups daily          Past Surgical History:  Procedure Laterality Date  . ANKLE SURGERY    . DOPPLER ECHOCARDIOGRAPHY  2010  . FOOT SURGERY    . KNEE SURGERY  08/04/08   Right knee-- medial meniscus repair, attenuation anterior cruciate [name] MD [facility])   . NM MYOVIEW LTD   2010  . PLANTAR FASCIA RELEASE    . SHOULDER SURGERY    . VARICOCELE EXCISION      Family History  Problem Relation Age of Onset  . Diabetes Mother   . Hypertension Mother   . Arthritis Mother   . Cancer Mother        uterine and breast cancer, sarcoma of the brain  . Hyperlipidemia Father   . Arthritis Father   . Cancer Father        thyroid, prostate cancer  . Other Father        mass in stomach  . Hyperlipidemia Maternal Grandmother   . Hypertension Maternal Grandmother   . Hyperlipidemia Maternal Grandfather   . Hypertension Maternal Grandfather   . Hyperlipidemia Paternal Grandmother   . Hypertension Paternal Grandmother   . Hyperlipidemia Paternal Grandfather   . Hypertension Paternal Grandfather   . Heart attack Paternal Grandfather   . Sudden death Neg Hx   . Colon cancer Neg Hx   . Esophageal cancer Neg Hx   . Stomach cancer Neg Hx   . Rectal cancer Neg Hx     Allergies  Allergen Reactions  . Oxycodone-Acetaminophen Hives  and Itching  . Zoloft [Sertraline Hcl]     ?weight gain. "made me feel weird"    Current Outpatient Prescriptions on File Prior to Visit  Medication Sig Dispense Refill  . atorvastatin (LIPITOR) 10 MG tablet TAKE 1 TABLET DAILY 90 tablet 1  . benzonatate (TESSALON PERLES) 100 MG capsule Take 1 capsule (100 mg total) by mouth 3 (three) times daily as needed for cough. 20 capsule 0  . Blood Glucose Monitoring Suppl (FREESTYLE LITE) DEVI Use to check blood sugar once a day.  E11.9 1 each 0  . buPROPion (WELLBUTRIN XL) 150 MG 24 hr tablet Take 1 tablet (150 mg total) by mouth daily. 90 tablet 0  . cetirizine (ZYRTEC) 10 MG tablet Take 1 tablet (10 mg total) by mouth daily. 30 tablet 5  . cholecalciferol (VITAMIN D) 1000 units tablet Take 1,000 Units by mouth daily.    . clomiPHENE (CLOMID) 50 MG tablet Take 0.5 tablets by mouth daily.    . diazepam (VALIUM) 10 MG tablet Take 1 tablet (10 mg total) by mouth every 6 (six) hours as needed for  anxiety. 2 tablet 0  . diphenhydrAMINE (BENADRYL) 25 mg capsule Take 25-50 mg by mouth at bedtime as needed.    . ferrous sulfate 325 (65 FE) MG tablet Take 325 mg by mouth 2 (two) times daily with a meal.    . fluticasone (FLONASE) 50 MCG/ACT nasal spray Place 2 sprays into both nostrils daily. 16 g 5  . FREESTYLE LITE test strip USE AS INSTRUCTED TO CHECK BLOOD SUGAR ONCE DAILY 100 each 1  . gabapentin (NEURONTIN) 600 MG tablet Take 1 tablet (600 mg total) by mouth 3 (three) times daily. Take only on day of procedure 6 tablet 1  . guaiFENesin-codeine 100-10 MG/5ML syrup Take 5 mLs by mouth every 6 (six) hours as needed for cough. 120 mL 0  . hydrALAZINE (APRESOLINE) 50 MG tablet TAKE 1 TABLET DAILY 90 tablet 1  . JARDIANCE 10 MG TABS tablet TAKE 1 TABLET DAILY 90 tablet 1  . KLOR-CON M20 20 MEQ tablet TAKE 1 TABLET DAILY 90 tablet 1  . Lancets (FREESTYLE) lancets USE TO CHECK BLOOD SUGAR ONCE DAILY 100 each 1  . liraglutide (VICTOZA) 18 MG/3ML SOPN Inject 0.3 mLs (1.8 mg total) into the skin daily. 27 mL 1  . metoprolol succinate (TOPROL XL) 50 MG 24 hr tablet Take 2 tablets (100 mg total) by mouth daily. PLEASE CONTACT OFFICE FOR ADDITIONAL REFILLS 2ND ATTEMPT 135 tablet 0  . Olmesartan-Amlodipine-HCTZ 40-10-25 MG TABS TAKE 1 TABLET DAILY 90 tablet 1  . omeprazole (PRILOSEC) 20 MG capsule TAKE 2 CAPSULES EVERY MORNING 180 capsule 1  . OVER THE COUNTER MEDICATION MAXIMIZE LIVING.  Maxfit.  Take 1 tablet daily.    Marland Kitchen OVER THE COUNTER MEDICATION OPTIMAL LIVING fish oil.  Take 2 capsules daily.    . pioglitazone (ACTOS) 30 MG tablet TAKE 1 TABLET DAILY 90 tablet 1  . Pyridoxine HCl (B-6 PO) Take 1 tablet by mouth daily.    . traMADol (ULTRAM) 50 MG tablet Take 1 tablet (50 mg total) by mouth every 12 (twelve) hours as needed. 60 tablet 1  . triamcinolone ointment (KENALOG) 0.1 % Apply to red itchy areas only below the face 80 g 3  . VIAGRA 100 MG tablet TAKE ONE-HALF (1/2) TO 1 TABLET DAILY AS  NEEDED 15 tablet 1  . vitamin C (ASCORBIC ACID) 500 MG tablet Take 500 mg by mouth daily.    Marland Kitchen  zolpidem (AMBIEN) 10 MG tablet Take 1 tablet (10 mg total) by mouth daily. 90 tablet 0  . [DISCONTINUED] potassium chloride (K-DUR) 10 MEQ tablet Take 1 tablet (10 mEq total) by mouth daily. 30 tablet 1   No current facility-administered medications on file prior to visit.     BP 126/78 (BP Location: Left Arm, Cuff Size: Large)   Pulse 71   Temp 98.4 F (36.9 C) (Oral)   Resp 18   Ht 6\' 1"  (1.854 m)   Wt (!) 351 lb 12.8 oz (159.6 kg)   SpO2 98% Comment: room air  BMI 46.41 kg/m    Objective:   Physical Exam  Constitutional: He is oriented to person, place, and time. He appears well-developed and well-nourished. No distress.  HENT:  Head: Normocephalic and atraumatic.  Right Ear: Tympanic membrane and ear canal normal.  Left Ear: Tympanic membrane and ear canal normal.  Mouth/Throat: No oropharyngeal exudate, posterior oropharyngeal edema or posterior oropharyngeal erythema.  Eyes: No scleral icterus.  Cardiovascular: Normal rate and regular rhythm.   No murmur heard. Pulmonary/Chest: Effort normal and breath sounds normal. No respiratory distress. He has no wheezes. He has no rales.  Musculoskeletal: He exhibits no edema.  Lymphadenopathy:    He has no cervical adenopathy.  Neurological: He is alert and oriented to person, place, and time.  Skin: Skin is warm and dry.  Psychiatric: He has a normal mood and affect. His behavior is normal. Thought content normal.          Assessment & Plan:  Viral bronchitis-clinically resolving.  Has some residual cough which is being aggravated I believe by his untreated allergic rhinitis.  Advised patient as follows:  Restart flonase and zyrtec for your allergy symptoms. Continue albuterol every 6 hours as needed.  Call if cough worsens, if you develop fever/shortness of breath or if not improved in next 1-2 weeks.

## 2016-09-24 ENCOUNTER — Ambulatory Visit (INDEPENDENT_AMBULATORY_CARE_PROVIDER_SITE_OTHER): Admitting: Family

## 2016-09-24 ENCOUNTER — Encounter: Payer: Self-pay | Admitting: Family

## 2016-09-24 ENCOUNTER — Ambulatory Visit (HOSPITAL_BASED_OUTPATIENT_CLINIC_OR_DEPARTMENT_OTHER)
Admission: RE | Admit: 2016-09-24 | Discharge: 2016-09-24 | Disposition: A | Discharge status: Home / Self Care | Source: Ambulatory Visit | Attending: Family | Admitting: Family

## 2016-09-24 VITALS — BP 142/96 | HR 68 | Temp 98.1°F | Resp 18 | Ht 73.0 in | Wt 351.4 lb

## 2016-09-24 DIAGNOSIS — R05 Cough: Secondary | ICD-10-CM

## 2016-09-24 DIAGNOSIS — R059 Cough, unspecified: Secondary | ICD-10-CM

## 2016-09-24 DIAGNOSIS — I10 Essential (primary) hypertension: Secondary | ICD-10-CM | POA: Diagnosis not present

## 2016-09-24 MED ORDER — HYDRALAZINE HCL 50 MG PO TABS: 50.0000 mg | ORAL_TABLET | Freq: Two times a day (BID) | ORAL | 1 refills | Status: DC

## 2016-09-24 MED ORDER — MONTELUKAST SODIUM 10 MG PO TABS: 10.0000 mg | ORAL_TABLET | Freq: Every day | ORAL | 3 refills | Status: DC

## 2016-09-24 MED FILL — MONTELUKAST SOD 10 MG TAB: 10 | 30 days supply | Qty: 30 | Fill #0

## 2016-09-24 NOTE — Progress Notes (Signed)
Subjective:    Patient ID: Charles Nash., male    DOB: 1965-06-20, 51 y.o.   MRN: [id number]  HPI   Mr. Colley is a 51 yr old male who presents today with chief complaint of cough. He was seen initially for cough in the ED on 09/04/15.  CXR was negative at that time.  He was treated empirically with a zpak at that time.  I saw the patient in follow up on 09/09/16.  He reported right sided ear pain, persistent cough, and post nasal drip at that time.  Last visit he was advised to restart flonase and zyrtec for his asthma symptoms.    Reports that he starts to cough when he talks a lot.  Cough is dry. Denies any other symptoms. Denies fever.  He reports that his albuterol inhaler helps some.    Review of Systems See HPI  Past Medical History:  Diagnosis Date  . ADHD (attention deficit hyperactivity disorder)   . Allergy   . Arthritis   . Bilateral varicoceles   . Diabetes mellitus   . Fatty liver 08/06/2011  . Genital herpes 10/25/2014  . GERD (gastroesophageal reflux disease)   . Hyperlipidemia   . Hypertension   . OSA (obstructive sleep apnea)   . Personal history of colonic polyps   . Plantar fasciitis   . PTSD (post-traumatic stress disorder)      Social History   Social History  . Marital status: Divorced    Spouse name: N/A  . Number of children: 2  . Years of education: N/A   Occupational History  . [employer]   Social History Main Topics  . Smoking status: Never Smoker  . Smokeless tobacco: Never Used  . Alcohol use No     Comment: occasional wine  . Drug use: No  . Sexual activity: Not on file   Other Topics Concern  . Not on file   Social History Narrative   Regular exercise:  No   Caffeine use:  2--32oz cups daily          Past Surgical History:  Procedure Laterality Date  . ANKLE SURGERY    . DOPPLER ECHOCARDIOGRAPHY  2010  . FOOT SURGERY    . KNEE SURGERY  08/04/08   Right knee-- medial meniscus repair, attenuation  anterior cruciate [name] MD [facility])   . NM MYOVIEW LTD  2010  . PLANTAR FASCIA RELEASE    . SHOULDER SURGERY    . VARICOCELE EXCISION      Family History  Problem Relation Age of Onset  . Diabetes Mother   . Hypertension Mother   . Arthritis Mother   . Cancer Mother        uterine and breast cancer, sarcoma of the brain  . Hyperlipidemia Father   . Arthritis Father   . Cancer Father        thyroid, prostate cancer  . Other Father        mass in stomach  . Hyperlipidemia Maternal Grandmother   . Hypertension Maternal Grandmother   . Hyperlipidemia Maternal Grandfather   . Hypertension Maternal Grandfather   . Hyperlipidemia Paternal Grandmother   . Hypertension Paternal Grandmother   . Hyperlipidemia Paternal Grandfather   . Hypertension Paternal Grandfather   . Heart attack Paternal Grandfather   . Sudden death Neg Hx   . Colon cancer Neg Hx   . Esophageal cancer Neg Hx   . Stomach cancer Neg  Hx   . Rectal cancer Neg Hx     Allergies  Allergen Reactions  . Oxycodone-Acetaminophen Hives and Itching  . Zoloft [Sertraline Hcl]     ?weight gain. "made me feel weird"    Current Outpatient Prescriptions on File Prior to Visit  Medication Sig Dispense Refill  . atorvastatin (LIPITOR) 10 MG tablet TAKE 1 TABLET DAILY 90 tablet 1  . Blood Glucose Monitoring Suppl (FREESTYLE LITE) DEVI Use to check blood sugar once a day.  E11.9 1 each 0  . buPROPion (WELLBUTRIN XL) 150 MG 24 hr tablet Take 1 tablet (150 mg total) by mouth daily. 90 tablet 0  . cetirizine (ZYRTEC) 10 MG tablet Take 1 tablet (10 mg total) by mouth daily. 30 tablet 5  . cholecalciferol (VITAMIN D) 1000 units tablet Take 1,000 Units by mouth daily.    . clomiPHENE (CLOMID) 50 MG tablet Take 0.5 tablets by mouth daily.    . diazepam (VALIUM) 10 MG tablet Take 1 tablet (10 mg total) by mouth every 6 (six) hours as needed for anxiety. 2 tablet 0  . diphenhydrAMINE (BENADRYL) 25 mg capsule Take  25-50 mg by mouth at bedtime as needed.    . ferrous sulfate 325 (65 FE) MG tablet Take 325 mg by mouth 2 (two) times daily with a meal.    . fluticasone (FLONASE) 50 MCG/ACT nasal spray Place 2 sprays into both nostrils daily. 16 g 6  . FREESTYLE LITE test strip USE AS INSTRUCTED TO CHECK BLOOD SUGAR ONCE DAILY 100 each 1  . gabapentin (NEURONTIN) 600 MG tablet Take 1 tablet (600 mg total) by mouth 3 (three) times daily. Take only on day of procedure 6 tablet 1  . hydrALAZINE (APRESOLINE) 50 MG tablet TAKE 1 TABLET DAILY 90 tablet 1  . JARDIANCE 10 MG TABS tablet TAKE 1 TABLET DAILY 90 tablet 1  . KLOR-CON M20 20 MEQ tablet TAKE 1 TABLET DAILY 90 tablet 1  . Lancets (FREESTYLE) lancets USE TO CHECK BLOOD SUGAR ONCE DAILY 100 each 1  . liraglutide (VICTOZA) 18 MG/3ML SOPN Inject 0.3 mLs (1.8 mg total) into the skin daily. 27 mL 1  . metoprolol succinate (TOPROL XL) 50 MG 24 hr tablet Take 2 tablets (100 mg total) by mouth daily. PLEASE CONTACT OFFICE FOR ADDITIONAL REFILLS 2ND ATTEMPT 135 tablet 0  . Olmesartan-Amlodipine-HCTZ 40-10-25 MG TABS TAKE 1 TABLET DAILY 90 tablet 1  . omeprazole (PRILOSEC) 20 MG capsule TAKE 2 CAPSULES EVERY MORNING 180 capsule 1  . OVER THE COUNTER MEDICATION MAXIMIZE LIVING.  Maxfit.  Take 1 tablet daily.    Marland Kitchen OVER THE COUNTER MEDICATION OPTIMAL LIVING fish oil.  Take 2 capsules daily.    . pioglitazone (ACTOS) 30 MG tablet TAKE 1 TABLET DAILY 90 tablet 1  . Pyridoxine HCl (B-6 PO) Take 1 tablet by mouth daily.    . traMADol (ULTRAM) 50 MG tablet Take 1 tablet (50 mg total) by mouth every 12 (twelve) hours as needed. 60 tablet 1  . triamcinolone ointment (KENALOG) 0.1 % Apply to red itchy areas only below the face 80 g 3  . VIAGRA 100 MG tablet TAKE ONE-HALF (1/2) TO 1 TABLET DAILY AS NEEDED 15 tablet 1  . vitamin C (ASCORBIC ACID) 500 MG tablet Take 500 mg by mouth daily.    Marland Kitchen zolpidem (AMBIEN) 10 MG tablet Take 1 tablet (10 mg total) by mouth daily. 90 tablet 0  .  [DISCONTINUED] potassium chloride (K-DUR) 10 MEQ tablet Take 1  tablet (10 mEq total) by mouth daily. 30 tablet 1   No current facility-administered medications on file prior to visit.     Pulse 68   Temp 98.1 F (36.7 C) (Oral)   Resp 18   Ht 6\' 1"  (1.854 m)   Wt (!) 351 lb 6.4 oz (159.4 kg)   SpO2 98%   BMI 46.36 kg/m        Objective:   Physical Exam  Constitutional: He is oriented to person, place, and time. He appears well-developed and well-nourished. No distress.  HENT:  Head: Normocephalic and atraumatic.  Right Ear: Tympanic membrane and ear canal normal.  Left Ear: Tympanic membrane and ear canal normal.  Mouth/Throat: No oropharyngeal exudate, posterior oropharyngeal edema or posterior oropharyngeal erythema.  Eyes: No scleral icterus.  Cardiovascular: Normal rate and regular rhythm.   No murmur heard. Pulmonary/Chest: Effort normal and breath sounds normal. No respiratory distress. He has no wheezes. He has no rales.  Musculoskeletal: He exhibits no edema.  Neurological: He is alert and oriented to person, place, and time.  Skin: Skin is warm and dry.  Psychiatric: He has a normal mood and affect. His behavior is normal. Thought content normal.          Assessment & Plan:  Cough due to Allergic rhinitis-  He continues to clear his throat and swallow drainage throughout visit.  I think that post nasal drip continues to contribute to his cough.  Will continue flonase/zyrtec. Add singulair.  Will also check a chest x-ray to confirm no sign of infection.  If this does not help his symptoms may need to consider d/c ARB as ARB could be contributing.  HTN-  BP is up today. Increase hydralazine to BID.

## 2016-09-24 NOTE — Patient Instructions (Signed)
Please complete chest x-ray on the first floor. Add singulair 10mg  once daily. Increase hydralazine to twice daily.

## 2016-10-08 ENCOUNTER — Encounter: Payer: Self-pay | Admitting: Family

## 2016-10-08 ENCOUNTER — Ambulatory Visit (INDEPENDENT_AMBULATORY_CARE_PROVIDER_SITE_OTHER): Admitting: Family

## 2016-10-08 VITALS — BP 153/90 | HR 67 | Temp 97.9°F | Resp 16 | Ht 73.0 in | Wt 346.0 lb

## 2016-10-08 DIAGNOSIS — R05 Cough: Secondary | ICD-10-CM

## 2016-10-08 DIAGNOSIS — I10 Essential (primary) hypertension: Secondary | ICD-10-CM | POA: Diagnosis not present

## 2016-10-08 DIAGNOSIS — R059 Cough, unspecified: Secondary | ICD-10-CM

## 2016-10-08 NOTE — Patient Instructions (Signed)
Please increase hydralazine to twice daily.

## 2016-10-08 NOTE — Progress Notes (Signed)
Subjective:    Patient ID: Charles Nash., male    DOB: May 08, 1965, 51 y.o.   MRN: [id number]  HPI  Mr. Tupper is a 51 yr old male who presents today with chief complaint of cough.  1) Cough- last visit we added singulair for his cough as it seemed to be secondary to his allergic rhinitis.  He was continued on flonase and zyrtec.  2) HTN-  Last visit blood pressure was noted to be uncontrolled.  We advised pt to increase hydralazine to bid.  He decided to continue once daily because he thought he was "having a bad day."  BP Readings from Last 3 Encounters:  10/08/16 (!) 153/90  09/24/16 (!) 142/96  09/09/16 126/78   3) Morbid obesity- he continues to work on weight loss and exercise and is scheduled for bariatric surgery on 10/29/16.   Wt Readings from Last 3 Encounters:  10/08/16 (!) 346 lb (156.9 kg)  09/24/16 (!) 351 lb 6.4 oz (159.4 kg)  09/09/16 (!) 351 lb 12.8 oz (159.6 kg)     Review of Systems See HPI  Past Medical History:  Diagnosis Date  . ADHD (attention deficit hyperactivity disorder)   . Allergy   . Arthritis   . Bilateral varicoceles   . Diabetes mellitus   . Fatty liver 08/06/2011  . Genital herpes 10/25/2014  . GERD (gastroesophageal reflux disease)   . Hyperlipidemia   . Hypertension   . OSA (obstructive sleep apnea)   . Personal history of colonic polyps   . Plantar fasciitis   . PTSD (post-traumatic stress disorder)      Social History   Social History  . Marital status: Divorced    Spouse name: N/A  . Number of children: 2  . Years of education: N/A   Occupational History  . [employer]   Social History Main Topics  . Smoking status: Never Smoker  . Smokeless tobacco: Never Used  . Alcohol use No     Comment: occasional wine  . Drug use: No  . Sexual activity: Not on file   Other Topics Concern  . Not on file   Social History Narrative   Regular exercise:  No   Caffeine use:  2--32oz cups daily          Past Surgical History:  Procedure Laterality Date  . ANKLE SURGERY    . DOPPLER ECHOCARDIOGRAPHY  2010  . FOOT SURGERY    . KNEE SURGERY  08/04/08   Right knee-- medial meniscus repair, attenuation anterior cruciate [name] MD [facility])   . NM MYOVIEW LTD  2010  . PLANTAR FASCIA RELEASE    . SHOULDER SURGERY    . VARICOCELE EXCISION      Family History  Problem Relation Age of Onset  . Diabetes Mother   . Hypertension Mother   . Arthritis Mother   . Cancer Mother        uterine and breast cancer, sarcoma of the brain  . Hyperlipidemia Father   . Arthritis Father   . Cancer Father        thyroid, prostate cancer  . Other Father        mass in stomach  . Hyperlipidemia Maternal Grandmother   . Hypertension Maternal Grandmother   . Hyperlipidemia Maternal Grandfather   . Hypertension Maternal Grandfather   . Hyperlipidemia Paternal Grandmother   . Hypertension Paternal Grandmother   . Hyperlipidemia Paternal Grandfather   . Hypertension  Paternal Grandfather   . Heart attack Paternal Grandfather   . Sudden death Neg Hx   . Colon cancer Neg Hx   . Esophageal cancer Neg Hx   . Stomach cancer Neg Hx   . Rectal cancer Neg Hx     Allergies  Allergen Reactions  . Oxycodone-Acetaminophen Hives and Itching  . Zoloft [Sertraline Hcl]     ?weight gain. "made me feel weird"    Current Outpatient Prescriptions on File Prior to Visit  Medication Sig Dispense Refill  . atorvastatin (LIPITOR) 10 MG tablet TAKE 1 TABLET DAILY 90 tablet 1  . Blood Glucose Monitoring Suppl (FREESTYLE LITE) DEVI Use to check blood sugar once a day.  E11.9 1 each 0  . buPROPion (WELLBUTRIN XL) 150 MG 24 hr tablet Take 1 tablet (150 mg total) by mouth daily. 90 tablet 0  . cholecalciferol (VITAMIN D) 1000 units tablet Take 1,000 Units by mouth daily.    . clomiPHENE (CLOMID) 50 MG tablet Take 0.5 tablets by mouth daily.    . diazepam (VALIUM) 10 MG tablet Take 1 tablet (10 mg  total) by mouth every 6 (six) hours as needed for anxiety. 2 tablet 0  . diphenhydrAMINE (BENADRYL) 25 mg capsule Take 25-50 mg by mouth at bedtime as needed.    . ferrous sulfate 325 (65 FE) MG tablet Take 325 mg by mouth 2 (two) times daily with a meal.    . fluticasone (FLONASE) 50 MCG/ACT nasal spray Place 2 sprays into both nostrils daily. 16 g 6  . FREESTYLE LITE test strip USE AS INSTRUCTED TO CHECK BLOOD SUGAR ONCE DAILY 100 each 1  . gabapentin (NEURONTIN) 600 MG tablet Take 1 tablet (600 mg total) by mouth 3 (three) times daily. Take only on day of procedure 6 tablet 1  . hydrALAZINE (APRESOLINE) 50 MG tablet Take 1 tablet (50 mg total) by mouth 2 (two) times daily. 180 tablet 1  . JARDIANCE 10 MG TABS tablet TAKE 1 TABLET DAILY 90 tablet 1  . KLOR-CON M20 20 MEQ tablet TAKE 1 TABLET DAILY 90 tablet 1  . Lancets (FREESTYLE) lancets USE TO CHECK BLOOD SUGAR ONCE DAILY 100 each 1  . metoprolol succinate (TOPROL XL) 50 MG 24 hr tablet Take 2 tablets (100 mg total) by mouth daily. PLEASE CONTACT OFFICE FOR ADDITIONAL REFILLS 2ND ATTEMPT 135 tablet 0  . montelukast (SINGULAIR) 10 MG tablet Take 1 tablet (10 mg total) by mouth at bedtime. 30 tablet 3  . Olmesartan-Amlodipine-HCTZ 40-10-25 MG TABS TAKE 1 TABLET DAILY 90 tablet 1  . omeprazole (PRILOSEC) 20 MG capsule TAKE 2 CAPSULES EVERY MORNING 180 capsule 1  . OVER THE COUNTER MEDICATION MAXIMIZE LIVING.  Maxfit.  Take 1 tablet daily.    Marland Kitchen OVER THE COUNTER MEDICATION OPTIMAL LIVING fish oil.  Take 2 capsules daily.    . pioglitazone (ACTOS) 30 MG tablet TAKE 1 TABLET DAILY 90 tablet 1  . Pyridoxine HCl (B-6 PO) Take 1 tablet by mouth daily.    . traMADol (ULTRAM) 50 MG tablet Take 1 tablet (50 mg total) by mouth every 12 (twelve) hours as needed. 60 tablet 1  . triamcinolone ointment (KENALOG) 0.1 % Apply to red itchy areas only below the face 80 g 3  . VIAGRA 100 MG tablet TAKE ONE-HALF (1/2) TO 1 TABLET DAILY AS NEEDED 15 tablet 1  .  vitamin C (ASCORBIC ACID) 500 MG tablet Take 500 mg by mouth daily.    Marland Kitchen zolpidem (AMBIEN) 10  MG tablet Take 1 tablet (10 mg total) by mouth daily. 90 tablet 0  . cetirizine (ZYRTEC) 10 MG tablet Take 1 tablet (10 mg total) by mouth daily. (Patient not taking: Reported on 10/08/2016) 30 tablet 5  . liraglutide (VICTOZA) 18 MG/3ML SOPN Inject 0.3 mLs (1.8 mg total) into the skin daily. (Patient not taking: Reported on 10/08/2016) 27 mL 1  . [DISCONTINUED] potassium chloride (K-DUR) 10 MEQ tablet Take 1 tablet (10 mEq total) by mouth daily. 30 tablet 1   No current facility-administered medications on file prior to visit.     BP (!) 153/90 (BP Location: Right Arm, Cuff Size: Large)   Pulse 67   Temp 97.9 F (36.6 C) (Oral)   Resp 16   Ht 6\' 1"  (1.854 m)   Wt (!) 346 lb (156.9 kg)   SpO2 98%   BMI 45.65 kg/m       Objective:   Physical Exam  Constitutional: He is oriented to person, place, and time. He appears well-developed and well-nourished. No distress.  HENT:  Head: Normocephalic and atraumatic.  Cardiovascular: Normal rate and regular rhythm.   No murmur heard. Pulmonary/Chest: Effort normal and breath sounds normal. No respiratory distress. He has no wheezes. He has no rales.  Musculoskeletal: He exhibits no edema.  Lymphadenopathy:    He has no cervical adenopathy.  Neurological: He is alert and oriented to person, place, and time.  Skin: Skin is warm and dry.  Psychiatric: He has a normal mood and affect. His behavior is normal. Thought content normal.          Assessment & Plan:  Cough- improved on singulair. He is also maintained on flonase and zyrtec. He notes that he saw another provider who gave him an rx for proair. He has been using this prior to exercise and is breathing much better. Advised pt OK to continue prn.   HTN- uncontrolled. Increase hydralazine to bid. Pt is agreeable to this.   Morbid obesity- advised pt to continue to work on exercise weight  loss. Commended him on his work thus far.

## 2016-10-10 ENCOUNTER — Ambulatory Visit (HOSPITAL_BASED_OUTPATIENT_CLINIC_OR_DEPARTMENT_OTHER): Admitting: Physical Medicine & Rehabilitation

## 2016-10-10 ENCOUNTER — Encounter: Payer: Self-pay | Admitting: Physical Medicine & Rehabilitation

## 2016-10-10 ENCOUNTER — Telehealth: Payer: Self-pay

## 2016-10-10 ENCOUNTER — Encounter: Attending: Physical Medicine & Rehabilitation

## 2016-10-10 ENCOUNTER — Ambulatory Visit (AMBULATORY_SURGERY_CENTER): Payer: Self-pay

## 2016-10-10 VITALS — BP 134/81 | HR 71 | Resp 14

## 2016-10-10 VITALS — Ht 73.0 in | Wt 350.0 lb

## 2016-10-10 DIAGNOSIS — M47816 Spondylosis without myelopathy or radiculopathy, lumbar region: Secondary | ICD-10-CM | POA: Diagnosis not present

## 2016-10-10 DIAGNOSIS — M6702 Short Achilles tendon (acquired), left ankle: Secondary | ICD-10-CM | POA: Diagnosis not present

## 2016-10-10 DIAGNOSIS — Z86010 Personal history of colonic polyps: Secondary | ICD-10-CM

## 2016-10-10 MED ORDER — NA SULFATE-K SULFATE-MG SULF 17.5-3.13-1.6 GM/177ML PO SOLN: 1.0000 | Freq: Once | ORAL | 0 refills | Status: AC

## 2016-10-10 MED ORDER — TRAMADOL HCL 50 MG PO TABS: 50.0000 mg | ORAL_TABLET | Freq: Two times a day (BID) | ORAL | 1 refills | Status: DC | PRN

## 2016-10-10 NOTE — Telephone Encounter (Signed)
Dr. Fuller Plan,  I just saw Charles Nash in [facility]. He is scheduled for a colonoscopy on 10/24/16 @ 8:30 Am. He was weighed at [facility] on 10/08/16 and weighted 346. Today he weighted 350. Patient is preparing for gastric by pass surgery, and he said that he is riding his bike everyday and he promises to not gain any weight prior to this procedure. I proceeded with PV, but did want to make you aware of this situation. Is it ok to have colonoscopy at [facility]? Please advise. Thanks.  Riki Sheer, LPN

## 2016-10-10 NOTE — Patient Instructions (Signed)

## 2016-10-10 NOTE — Progress Notes (Signed)
  PROCEDURE RECORD [facility]   Name: Charles Nash. DOB:Aug 14, 1965 MRN: [id number]  Date:10/10/2016  Physician: Alysia Penna, MD    Nurse/CMA: Tinley Rought, CMA  Allergies:  Allergies  Allergen Reactions  . Oxycodone-Acetaminophen Hives and Itching  . Zoloft [Sertraline Hcl]     ?weight gain. "made me feel weird"    Consent Signed: Yes.    Is patient diabetic? Yes.    CBG today? ?  Pregnant: No. LMP: No LMP for male patient. (age 13-55)  Anticoagulants: no Anti-inflammatory: no Antibiotics: no  Procedure: right radiofrequency Position: Prone Start Time: 10:28am  End Time: 10:53am Fluoro Time: 49.0  RN/CMA Matvey Llanas, CMA Valinda Fedie, CMA    Time 10:00am 10:55am    BP 134/81 143/86    Pulse 70 68    Respirations 16 16    O2 Sat 97 96    S/S 6 6    Pain Level 4/10 4/10     D/C home with [name], patient A & O X 3, D/C instructions reviewed, and sits independently.

## 2016-10-10 NOTE — Progress Notes (Signed)
RightL5 dorsal ramus., Right L4 and Right L3 medial branch radio frequency neurotomy under fluoroscopic guidance   Indication: Low back pain due to lumbar spondylosis which has been relieved on 2 occasions by greater than 50% by lumbar medial branch blocks at corresponding levels.  Informed consent was obtained after describing risks and benefits of the procedure with the patient, this includes bleeding, bruising, infection, paralysis and medication side effects. The patient wishes to proceed and has given written consent. The patient was placed in a prone position. The lumbar and sacral area was marked and prepped with Betadine. A 25-gauge 1-1/2 inch needle was inserted into the skin and subcutaneous tissue at 3 sites in one ML of 1% lidocaine was injected into each site. Then a 18-gauge 15 cm radio frequency needle with a 1 cm curved active tip was inserted targeting the Right S1 SAP/sacral ala junction. Bone contact was made and confirmed with lateral imaging.  motor stimulation at 2 Hz confirm proper needle location followed by injection of 46m 2% MPF lidocaine. Then the Right L5 SAP/transverse process junction was targeted. Bone contact was made and confirmed with lateral imaging.  motor stimulation at 2 Hz confirm proper needle location followed by injection of 145m2% MPF lidocaine. Then the Right L4 SAP/transverse process junction was targeted. Bone contact was made and confirmed with lateral imaging. motor stimulation at 2 Hz confirm proper needle location followed by injection of 63m9m% MPF lidocaine. Radio frequency lesion being [facility] 90 seconds was performed. Needles were removed. Post procedure instructions and vital signs were performed. Patient tolerated procedure well. Followup appointment was given.

## 2016-10-10 NOTE — Progress Notes (Signed)
Denies allergies to eggs or soy products. Denies complication of anesthesia or sedation. Denies use of weight loss medication. Denies use of O2.   Emmi instructions given for colonoscopy.  

## 2016-10-10 NOTE — Telephone Encounter (Signed)
Yes ok to proceed. Please ask him to try to lose a few pounds before colonoscopy.

## 2016-10-13 ENCOUNTER — Telehealth: Payer: Self-pay | Admitting: *Deleted

## 2016-10-13 MED ORDER — ZOLPIDEM TARTRATE 10 MG PO TABS: 10.0000 mg | ORAL_TABLET | Freq: Every day | ORAL | 0 refills | Status: DC

## 2016-10-13 NOTE — Telephone Encounter (Signed)
noted 

## 2016-10-13 NOTE — Telephone Encounter (Signed)
Rx faxed to mail order 

## 2016-10-13 NOTE — Telephone Encounter (Signed)
Faxed refill request received from [facility] for Zolpidem 10 mg tablet Last filled by MD on 07/23/16, #[id number] Last AEX - 10/08/16 Last UDS - 07/14/16, moderate Please Advise on refills/SLS 07/02

## 2016-10-17 MED FILL — SUPREP BOWEL PREP KIT: 17.5-3.13-1 | 1 days supply | Qty: 354 | Fill #0

## 2016-10-24 ENCOUNTER — Encounter: Payer: Self-pay | Admitting: Gastroenterology

## 2016-10-24 ENCOUNTER — Ambulatory Visit (AMBULATORY_SURGERY_CENTER): Admitting: Gastroenterology

## 2016-10-24 VITALS — BP 97/63 | HR 75 | Temp 96.6°F | Resp 15 | Ht 73.0 in | Wt 337.0 lb

## 2016-10-24 DIAGNOSIS — Z86010 Personal history of colonic polyps: Secondary | ICD-10-CM | POA: Diagnosis present

## 2016-10-24 DIAGNOSIS — D123 Benign neoplasm of transverse colon: Secondary | ICD-10-CM

## 2016-10-24 MED ORDER — SODIUM CHLORIDE 0.9 % IV SOLN: 500.0000 mL | INTRAVENOUS | Status: AC

## 2016-10-24 NOTE — Patient Instructions (Signed)
Impression/Recommendations:  Polyp handout given to patient. Hemorrhoid handout given to patient.  Repeat colonoscopy in 5 years for surveillance.  Resume previous diet. Continue present medications.  YOU HAD AN ENDOSCOPIC PROCEDURE TODAY AT THE [facility]:   Refer to the procedure report that was given to you for any specific questions about what was found during the examination.  If the procedure report does not answer your questions, please call your gastroenterologist to clarify.  If you requested that your care partner not be given the details of your procedure findings, then the procedure report has been included in a sealed envelope for you to review at your convenience later.  YOU SHOULD EXPECT: Some feelings of bloating in the abdomen. Passage of more gas than usual.  Walking can help get rid of the air that was put into your GI tract during the procedure and reduce the bloating. If you had a lower endoscopy (such as a colonoscopy or flexible sigmoidoscopy) you may notice spotting of blood in your stool or on the toilet paper. If you underwent a bowel prep for your procedure, you may not have a normal bowel movement for a few days.  Please Note:  You might notice some irritation and congestion in your nose or some drainage.  This is from the oxygen used during your procedure.  There is no need for concern and it should clear up in a day or so.  SYMPTOMS TO REPORT IMMEDIATELY:   Following lower endoscopy (colonoscopy or flexible sigmoidoscopy):  Excessive amounts of blood in the stool  Significant tenderness or worsening of abdominal pains  Swelling of the abdomen that is new, acute  Fever of 100F or higher  For urgent or emergent issues, a gastroenterologist can be reached at any hour by calling [phone].   DIET:  We do recommend a small meal at first, but then you may proceed to your regular diet.  Drink plenty of fluids but you should avoid alcoholic  beverages for 24 hours.  ACTIVITY:  You should plan to take it easy for the rest of today and you should NOT DRIVE or use heavy machinery until tomorrow (because of the sedation medicines used during the test).    FOLLOW UP: Our staff will call the number listed on your records the next business day following your procedure to check on you and address any questions or concerns that you may have regarding the information given to you following your procedure. If we do not reach you, we will leave a message.  However, if you are feeling well and you are not experiencing any problems, there is no need to return our call.  We will assume that you have returned to your regular daily activities without incident.  If any biopsies were taken you will be contacted by phone or by letter within the next 1-3 weeks.  Please call us at [phone] if you have not heard about the biopsies in 3 weeks.    SIGNATURES/CONFIDENTIALITY: You and/or your care partner have signed paperwork which will be entered into your electronic medical record.  These signatures attest to the fact that that the information above on your After Visit Summary has been reviewed and is understood.  Full responsibility of the confidentiality of this discharge information lies with you and/or your care-partner. 

## 2016-10-24 NOTE — Progress Notes (Signed)
To PACU VSS Report to RN 

## 2016-10-24 NOTE — Progress Notes (Signed)
Pt's states no medical or surgical changes since previsit or office visit. 

## 2016-10-24 NOTE — Progress Notes (Signed)
Called to room to assist during endoscopic procedure.  Patient ID and intended procedure confirmed with present staff. Received instructions for my participation in the procedure from the performing physician.  

## 2016-10-24 NOTE — Op Note (Signed)
[name] Procedure Date: 10/24/2016 8:33 AM MRN: [id number] Endoscopist: Ladene Artist , MD Age: 51 Referring MD:  Date of Birth: [birth year] Gender: Male Account #: [id number] Procedure:                Colonoscopy Indications:              Surveillance: Personal history of adenomatous                            polyps on last colonoscopy 5 years ago Medicines:                Monitored Anesthesia Care Procedure:                Pre-Anesthesia Assessment:                           - Prior to the procedure, a History and Physical                            was performed, and patient medications and                            allergies were reviewed. The patient's tolerance of                            previous anesthesia was also reviewed. The risks                            and benefits of the procedure and the sedation                            options and risks were discussed with the patient.                            All questions were answered, and informed consent                            was obtained. Prior Anticoagulants: The patient has                            taken no previous anticoagulant or antiplatelet                            agents. ASA Grade Assessment: II - A patient with                            mild systemic disease. After reviewing the risks                            and benefits, the patient was deemed in                            satisfactory condition to undergo the procedure.  After obtaining informed consent, the colonoscope                            was passed under direct vision. Throughout the                            procedure, the patient's blood pressure, pulse, and                            oxygen saturations were monitored continuously. The                            Colonoscope was introduced through the anus and                            advanced to the the cecum,  identified by                            appendiceal orifice and ileocecal valve. The                            ileocecal valve, appendiceal orifice, and rectum                            were photographed. The quality of the bowel                            preparation was excellent. The colonoscopy was                            performed without difficulty. The patient tolerated                            the procedure well. Scope In: 8:37:13 AM Scope Out: 8:49:31 AM Scope Withdrawal Time: 0 hours 10 minutes 45 seconds  Total Procedure Duration: 0 hours 12 minutes 18 seconds  Findings:                 The perianal and digital rectal examinations were                            normal.                           Two sessile polyps were found in the transverse                            colon. The polyps were 4 to 5 mm in size. These                            polyps were removed with a cold biopsy forceps.                            Resection and retrieval were complete.  Internal hemorrhoids were found during                            retroflexion. The hemorrhoids were small and Grade                            I (internal hemorrhoids that do not prolapse).                           The exam was otherwise without abnormality on                            direct and retroflexion views. Complications:            No immediate complications. Estimated blood loss:                            None. Estimated Blood Loss:     Estimated blood loss: none. Impression:               - Two 4 to 5 mm polyps in the transverse colon,                            removed with a cold biopsy forceps. Resected and                            retrieved.                           - Internal hemorrhoids.                           - The examination was otherwise normal on direct                            and retroflexion views. Recommendation:           - Repeat colonoscopy in 5  years for surveillance.                           - Patient has a contact number available for                            emergencies. The signs and symptoms of potential                            delayed complications were discussed with the                            patient. Return to normal activities tomorrow.                            Written discharge instructions were provided to the                            patient.                           -  Resume previous diet.                           - Continue present medications.                           - Await pathology results. Ladene Artist, MD 10/24/2016 8:51:56 AM This report has been signed electronically.

## 2016-10-26 ENCOUNTER — Other Ambulatory Visit: Payer: Self-pay | Admitting: Family

## 2016-10-27 ENCOUNTER — Ambulatory Visit (INDEPENDENT_AMBULATORY_CARE_PROVIDER_SITE_OTHER): Admitting: Family

## 2016-10-27 ENCOUNTER — Encounter: Payer: Self-pay | Admitting: Family

## 2016-10-27 ENCOUNTER — Telehealth: Payer: Self-pay

## 2016-10-27 ENCOUNTER — Telehealth: Payer: Self-pay | Admitting: *Deleted

## 2016-10-27 VITALS — BP 142/82 | HR 72 | Temp 98.1°F | Resp 18 | Ht 73.0 in | Wt 343.6 lb

## 2016-10-27 DIAGNOSIS — I10 Essential (primary) hypertension: Secondary | ICD-10-CM

## 2016-10-27 DIAGNOSIS — Z23 Encounter for immunization: Secondary | ICD-10-CM

## 2016-10-27 NOTE — Progress Notes (Signed)
Subjective:    Patient ID: Charles Courier., male    DOB: 02/14/1966, 51 y.o.   MRN: [id number]  HPI   [name] is a 51 yr old male who presents today for follow up of his blood pressure.   HTN- he was last seen on 6/27 and bp was noted to be 153/90.  He was advised to increase his hydralazine to bid. He states that his follow up BP at his bariatric appointment was 121/78.  I reviewed his appointment for 7/3 and his BP was actually recorded 154/81 at [facility]. Later that day BP was 145/76.    Review of Systems    see HPI  Past Medical History:  Diagnosis Date  . ADHD (attention deficit hyperactivity disorder)   . Allergy   . Arthritis   . Bilateral varicoceles   . Depression   . Diabetes mellitus   . Fatty liver 08/06/2011  . Genital herpes 10/25/2014  . GERD (gastroesophageal reflux disease)   . Hyperlipidemia   . Hypertension   . OSA (obstructive sleep apnea)   . Personal history of colonic polyps   . Plantar fasciitis   . PTSD (post-traumatic stress disorder)   . Sleep apnea      Social History   Social History  . Marital status: Divorced    Spouse name: N/A  . Number of children: 2  . Years of education: N/A   Occupational History  . [employer]   Social History Main Topics  . Smoking status: Never Smoker  . Smokeless tobacco: Never Used  . Alcohol use No     Comment: occasional wine  . Drug use: No  . Sexual activity: Not on file   Other Topics Concern  . Not on file   Social History Narrative   Regular exercise:  No   Caffeine use:  2--32oz cups daily          Past Surgical History:  Procedure Laterality Date  . ANKLE SURGERY    . DOPPLER ECHOCARDIOGRAPHY  2010  . FOOT SURGERY    . KNEE SURGERY  08/04/08   Right knee-- medial meniscus repair, attenuation anterior cruciate [name] MD [facility])   . NM MYOVIEW LTD  2010  . PLANTAR FASCIA RELEASE    . SHOULDER SURGERY    . VARICOCELE EXCISION       Family History  Problem Relation Age of Onset  . Diabetes Mother   . Hypertension Mother   . Arthritis Mother   . Cancer Mother        uterine and breast cancer, sarcoma of the brain  . Hyperlipidemia Father   . Arthritis Father   . Cancer Father        thyroid, prostate cancer  . Other Father        mass in stomach  . Hyperlipidemia Maternal Grandmother   . Hypertension Maternal Grandmother   . Hyperlipidemia Maternal Grandfather   . Hypertension Maternal Grandfather   . Hyperlipidemia Paternal Grandmother   . Hypertension Paternal Grandmother   . Hyperlipidemia Paternal Grandfather   . Hypertension Paternal Grandfather   . Heart attack Paternal Grandfather   . Sudden death Neg Hx   . Colon cancer Neg Hx   . Esophageal cancer Neg Hx   . Stomach cancer Neg Hx   . Rectal cancer Neg Hx     Allergies  Allergen Reactions  . Oxycodone-Acetaminophen Hives and Itching  . Zoloft [Sertraline Hcl]     ?  weight gain. "made me feel weird"    Current Outpatient Prescriptions on File Prior to Visit  Medication Sig Dispense Refill  . atorvastatin (LIPITOR) 10 MG tablet TAKE 1 TABLET DAILY 90 tablet 1  . Blood Glucose Monitoring Suppl (FREESTYLE LITE) DEVI Use to check blood sugar once a day.  E11.9 1 each 0  . buPROPion (WELLBUTRIN XL) 150 MG 24 hr tablet Take 1 tablet (150 mg total) by mouth daily. 90 tablet 0  . cetirizine (ZYRTEC) 10 MG tablet Take 1 tablet (10 mg total) by mouth daily. 30 tablet 5  . cholecalciferol (VITAMIN D) 1000 units tablet Take 1,000 Units by mouth daily.    . clomiPHENE (CLOMID) 50 MG tablet Take 0.5 tablets by mouth daily.    . diazepam (VALIUM) 10 MG tablet Take 1 tablet (10 mg total) by mouth every 6 (six) hours as needed for anxiety. 2 tablet 0  . diphenhydrAMINE (BENADRYL) 25 mg capsule Take 25-50 mg by mouth at bedtime as needed.    . ferrous sulfate 325 (65 FE) MG tablet Take 325 mg by mouth 2 (two) times daily with a meal.    . fluticasone  (FLONASE) 50 MCG/ACT nasal spray Place 2 sprays into both nostrils daily. 16 g 6  . FREESTYLE LITE test strip USE AS INSTRUCTED TO CHECK BLOOD SUGAR ONCE DAILY 100 each 1  . gabapentin (NEURONTIN) 600 MG tablet Take 1 tablet (600 mg total) by mouth 3 (three) times daily. Take only on day of procedure 6 tablet 1  . hydrALAZINE (APRESOLINE) 50 MG tablet Take 1 tablet (50 mg total) by mouth 2 (two) times daily. 180 tablet 1  . JARDIANCE 10 MG TABS tablet TAKE 1 TABLET DAILY 90 tablet 1  . KLOR-CON M20 20 MEQ tablet TAKE 1 TABLET DAILY 90 tablet 1  . Lancets (FREESTYLE) lancets USE TO CHECK BLOOD SUGAR ONCE DAILY 100 each 1  . metoprolol succinate (TOPROL XL) 50 MG 24 hr tablet Take 2 tablets (100 mg total) by mouth daily. PLEASE CONTACT OFFICE FOR ADDITIONAL REFILLS 2ND ATTEMPT 135 tablet 0  . montelukast (SINGULAIR) 10 MG tablet Take 1 tablet (10 mg total) by mouth at bedtime. 30 tablet 3  . Olmesartan-Amlodipine-HCTZ 40-10-25 MG TABS TAKE 1 TABLET DAILY 90 tablet 1  . omeprazole (PRILOSEC) 20 MG capsule TAKE 2 CAPSULES EVERY MORNING 180 capsule 1  . pioglitazone (ACTOS) 30 MG tablet TAKE 1 TABLET DAILY 90 tablet 1  . PROAIR HFA 108 (90 Base) MCG/ACT inhaler Inhale 2 puffs into the lungs 4 (four) times daily as needed.    . Pyridoxine HCl (B-6 PO) Take 1 tablet by mouth daily.    . traMADol (ULTRAM) 50 MG tablet Take 1 tablet (50 mg total) by mouth every 12 (twelve) hours as needed. 60 tablet 1  . triamcinolone ointment (KENALOG) 0.1 % Apply to red itchy areas only below the face 80 g 3  . VIAGRA 100 MG tablet TAKE ONE-HALF (1/2) TO 1 TABLET DAILY AS NEEDED 15 tablet 1  . vitamin C (ASCORBIC ACID) 500 MG tablet Take 500 mg by mouth daily.    Marland Kitchen zolpidem (AMBIEN) 10 MG tablet Take 1 tablet (10 mg total) by mouth daily. 90 tablet 0  . [DISCONTINUED] potassium chloride (K-DUR) 10 MEQ tablet Take 1 tablet (10 mEq total) by mouth daily. 30 tablet 1   Current Facility-Administered Medications on File  Prior to Visit  Medication Dose Route Frequency Provider Last Rate Last Dose  . 0.9 %  sodium chloride infusion  500 mL Intravenous Continuous Ladene Artist, MD        BP 140/84 (BP Location: Left Arm, Cuff Size: Large)   Pulse 72   Temp 98.1 F (36.7 C) (Oral)   Resp 18   Ht 6\' 1"  (1.854 m)   Wt (!) 343 lb 9.6 oz (155.9 kg)   SpO2 99%   BMI 45.33 kg/m    Objective:   Physical Exam  Constitutional: He is oriented to person, place, and time. He appears well-developed and well-nourished. No distress.  HENT:  Head: Normocephalic and atraumatic.  Cardiovascular: Normal rate and regular rhythm.   No murmur heard. Pulmonary/Chest: Effort normal and breath sounds normal. No respiratory distress. He has no wheezes. He has no rales.  Musculoskeletal: He exhibits no edema.  Neurological: He is alert and oriented to person, place, and time.  Skin: Skin is warm and dry.  Psychiatric: He has a normal mood and affect. His behavior is normal. Thought content normal.          Assessment & Plan:  HTN- still slightly above goal. Advised pt to increase hydralazine to bid.  Tdap today.

## 2016-10-27 NOTE — Telephone Encounter (Signed)
  Follow up Call-  Call back number 10/24/2016  Post procedure Call Back phone  # [phone]  Permission to leave phone message (No Data)  comments no voicemail set up  Some recent data might be hidden     Patient questions:  Do you have a fever, pain , or abdominal swelling? No. Pain Score  0 *  Have you tolerated food without any problems? Yes.    Have you been able to return to your normal activities? Yes.    Do you have any questions about your discharge instructions: Diet   No. Medications  No. Follow up visit  No.  Do you have questions or concerns about your Care? No.  Actions: * If pain score is 4 or above: No action needed, pain <4.

## 2016-10-27 NOTE — Addendum Note (Signed)
Addended by: Kelle Darting A on: 10/27/2016 12:16 PM   Modules accepted: Orders

## 2016-10-27 NOTE — Patient Instructions (Signed)
Please increase hydralazine to twice daily.

## 2016-10-27 NOTE — Telephone Encounter (Signed)
Attempted to reach pt. With follow up call following endoscopic procedure 10/24/16.   Unable to LM on pt. Voice mail, will try to reach pt. Again later today.

## 2016-10-29 ENCOUNTER — Encounter: Payer: Self-pay | Admitting: Gastroenterology

## 2016-10-29 HISTORY — PX: SLEEVE GASTROPLASTY: SHX1101

## 2016-11-01 ENCOUNTER — Telehealth: Payer: Self-pay | Admitting: Family

## 2016-11-01 NOTE — Telephone Encounter (Signed)
Please let pt know that I have completed most of his medical form the the DMV.  However, due to his history of OSA they are requiring his 1 year compliance report for his CPAP.  What company is he using?  Could we please request this report?    Thanks!

## 2016-11-05 NOTE — Telephone Encounter (Signed)
Called patient and informed him.  He stated understanding and asked that we call [facility] [place]--[phone] for report.  He also wanted [name] to know that his surgery went exceptionally well.

## 2016-11-05 NOTE — Telephone Encounter (Signed)
Called [facility] at number listed below and was transferred to the Sleep Lab.  They advised that I call [facility] department.  Called [facility] at [phone] ext [phone].  No answer x 3.  Called back and requested to speak to operator.  She connected me to [facility]. Spoke with Tiffany.  She stated they are unable to take a verbal, but can accept written request. She asked that we fax request to [phone].   Request faxed.  Fax confirmation received. Awaiting report.

## 2016-11-06 NOTE — Telephone Encounter (Signed)
Report received, numbered and forwarded to PCP for review.

## 2016-11-10 ENCOUNTER — Ambulatory Visit (INDEPENDENT_AMBULATORY_CARE_PROVIDER_SITE_OTHER): Admitting: Allergy and Immunology

## 2016-11-10 ENCOUNTER — Encounter: Payer: Self-pay | Admitting: Allergy and Immunology

## 2016-11-10 DIAGNOSIS — J3089 Other allergic rhinitis: Secondary | ICD-10-CM | POA: Diagnosis not present

## 2016-11-10 DIAGNOSIS — W57XXXA Bitten or stung by nonvenomous insect and other nonvenomous arthropods, initial encounter: Secondary | ICD-10-CM

## 2016-11-10 MED ORDER — FEXOFENADINE HCL 180 MG PO TABS: 180.0000 mg | ORAL_TABLET | Freq: Every day | ORAL | 2 refills | Status: AC | PRN

## 2016-11-10 NOTE — Progress Notes (Signed)
Follow-up Note  RE: Charles Nash. MRN: [id number] DOB: 1966/02/10 Date of Office Visit: 11/10/2016  Primary care provider: Debbrah Alar, NP Referring provider: Debbrah Alar, NP  History of present illness: Charles Nash. is a 51 y.o. male presenting today with a new problem.  He reports that on Friday night he spent the night and someone else's bed/apartment and woke up on Saturday morning with red, itchy lesions on his arms.  He stayed began at the apartment on Saturday night and last night and each morning wakes up with more lesions on his extremities and abdomen.  The lesions do not seem evanescent.  He believes that the lesions are from bedbugs or fleas.  He has not experienced concomitant cardiopulmonary or GI symptoms.  He has no nasal allergy symptoms complaints today.   Assessment and plan: Insect bites The history, distribution, and appearance of lesions suggest insect bites.  Idiopathic urticaria is another possibility, however with this condition individual lesions tend to resolve within several hours without residual pigmentation or bruising.  I have recommended washing all of his clothing in hot water and returning to his apartment.  If new lesions appear, evaluation of the home and bedroom for insects,   particularly Cimex lectularius, by a professional exterminator is recommended.  A prescription has been provided for levocetirizine, 5mg  daily as needed.  He may also apply triamcinolone 0.1% ointment sparingly to affected areas as needed.  Care is to be taken to avoid the axillae and groin area.  If this problem persists or progresses despite the recommendations as outlined above, we will evaluate further.  Other allergic rhinitis Stable.  Continue appropriate allergen avoidance measures and fluticasone nasal spray as needed.  Levocetirizine has been prescribed (as above).   Meds ordered this encounter  Medications  . fexofenadine  (ALLEGRA) 180 MG tablet    Sig: Take 1 tablet (180 mg total) by mouth daily as needed for allergies or rhinitis.    Dispense:  30 tablet    Refill:  2    Diagnostics: None.    Physical examination: Blood pressure 120/80, pulse 75, temperature 98 F (36.7 C), temperature source Oral, resp. rate 16, weight (!) 322 lb 4.8 oz (146.2 kg), SpO2 98 %.  General: Alert, interactive, in no acute distress. HEENT: TMs pearly gray, turbinates minimally edematous without discharge, post-pharynx mildly erythematous. Neck: Supple without lymphadenopathy. Lungs: Clear to auscultation without wheezing, rhonchi or rales. CV: Normal S1, S2 without murmurs. Skin: Scattered round, erythematous, raised lesions on the upper fremitus, lower extremities, and abdomen.  The following portions of the patient's history were reviewed and updated as appropriate: allergies, current medications, past family history, past medical history, past social history, past surgical history and problem list.  Allergies as of 11/10/2016      Reactions   Oxycodone-acetaminophen Hives, Itching   Zoloft [sertraline Hcl]    ?weight gain. "made me feel weird"      Medication List       Accurate as of 11/10/16  6:39 PM. Always use your most recent med list.          atorvastatin 10 MG tablet Commonly known as:  LIPITOR TAKE 1 TABLET DAILY   B-6 PO Take 1 tablet by mouth daily.   buPROPion 150 MG 24 hr tablet Commonly known as:  WELLBUTRIN XL TAKE 1 TABLET DAILY   cetirizine 10 MG tablet Commonly known as:  ZYRTEC Take by mouth.   cholecalciferol 1000 units tablet Commonly known  as:  VITAMIN D Take 1,000 Units by mouth daily.   clomiPHENE 50 MG tablet Commonly known as:  CLOMID Take 0.5 tablets by mouth daily.   diazepam 10 MG tablet Commonly known as:  VALIUM Take 1 tablet (10 mg total) by mouth every 6 (six) hours as needed for anxiety.   ferrous sulfate 325 (65 FE) MG tablet Take 325 mg by mouth 2  (two) times daily with a meal.   fexofenadine 180 MG tablet Commonly known as:  ALLEGRA Take 1 tablet (180 mg total) by mouth daily as needed for allergies or rhinitis.   fluticasone 50 MCG/ACT nasal spray Commonly known as:  FLONASE Place 2 sprays into both nostrils daily.   freestyle lancets USE TO CHECK BLOOD SUGAR ONCE DAILY   FREESTYLE LITE Devi Use to check blood sugar once a day.  E11.9   FREESTYLE LITE test strip Generic drug:  glucose blood USE AS INSTRUCTED TO CHECK BLOOD SUGAR ONCE DAILY   hydrALAZINE 50 MG tablet Commonly known as:  APRESOLINE Take 1 tablet (50 mg total) by mouth 2 (two) times daily.   JARDIANCE 10 MG Tabs tablet Generic drug:  empagliflozin TAKE 1 TABLET DAILY   KLOR-CON M20 20 MEQ tablet Generic drug:  potassium chloride SA TAKE 1 TABLET DAILY   metoprolol succinate 50 MG 24 hr tablet Commonly known as:  TOPROL XL Take 2 tablets (100 mg total) by mouth daily. PLEASE CONTACT OFFICE FOR ADDITIONAL REFILLS 2ND ATTEMPT   montelukast 10 MG tablet Commonly known as:  SINGULAIR Take 1 tablet (10 mg total) by mouth at bedtime.   montelukast 10 MG tablet Commonly known as:  SINGULAIR Take by mouth.   Olmesartan-Amlodipine-HCTZ 40-10-25 MG Tabs TAKE 1 TABLET DAILY   omeprazole 20 MG capsule Commonly known as:  PRILOSEC TAKE 2 CAPSULES EVERY MORNING   pioglitazone 30 MG tablet Commonly known as:  ACTOS TAKE 1 TABLET DAILY   polyethylene glycol packet Commonly known as:  MIRALAX / GLYCOLAX   PROAIR HFA 108 (90 Base) MCG/ACT inhaler Generic drug:  albuterol Inhale 2 puffs into the lungs 4 (four) times daily as needed.   scopolamine 1 MG/3DAYS Commonly known as:  TRANSDERM-SCOP Place onto the skin.   traMADol 50 MG tablet Commonly known as:  ULTRAM Take 1 tablet (50 mg total) by mouth every 12 (twelve) hours as needed.   triamcinolone ointment 0.1 % Commonly known as:  KENALOG Apply to red itchy areas only below the face     VIAGRA 100 MG tablet Generic drug:  sildenafil TAKE ONE-HALF (1/2) TO 1 TABLET DAILY AS NEEDED   vitamin C 500 MG tablet Commonly known as:  ASCORBIC ACID Take 500 mg by mouth daily.   zolpidem 10 MG tablet Commonly known as:  AMBIEN Take 1 tablet (10 mg total) by mouth daily.       Allergies  Allergen Reactions  . Oxycodone-Acetaminophen Hives and Itching  . Zoloft [Sertraline Hcl]     ?weight gain. "made me feel weird"    I appreciate the opportunity to take part in [facility] care. Please do not hesitate to contact me with questions.  Sincerely,   R. Edgar Frisk, MD

## 2016-11-10 NOTE — Assessment & Plan Note (Addendum)
The history, distribution, and appearance of lesions suggest insect bites.  Idiopathic urticaria is another possibility, however with this condition individual lesions tend to resolve within several hours without residual pigmentation or bruising.  I have recommended washing all of his clothing in hot water and returning to his apartment.  If new lesions appear, evaluation of the home and bedroom for insects,   particularly Cimex lectularius, by a professional exterminator is recommended.  A prescription has been provided for levocetirizine, 5mg  daily as needed.  He may also apply triamcinolone 0.1% ointment sparingly to affected areas as needed.  Care is to be taken to avoid the axillae and groin area.  If this problem persists or progresses despite the recommendations as outlined above, we will evaluate further.

## 2016-11-10 NOTE — Assessment & Plan Note (Signed)
Stable.  Continue appropriate allergen avoidance measures and fluticasone nasal spray as needed.  Levocetirizine has been prescribed (as above).

## 2016-11-10 NOTE — Patient Instructions (Signed)
Insect bites The history, distribution, and appearance of lesions suggest insect bites.  Idiopathic urticaria is another possibility, however with this condition individual lesions tend to resolve within several hours without residual pigmentation or bruising.  I have recommended washing all of his clothing in hot water and returning to his apartment.  If new lesions appear, evaluation of the home and bedroom for insects,   particularly Cimex lectularius, by a professional exterminator is recommended.  A prescription has been provided for levocetirizine, 5mg  daily as needed.  He may also apply triamcinolone 0.1% ointment sparingly to affected areas as needed.  Care is to be taken to avoid the axillae and groin area.  If this problem persists or progresses despite the recommendations as outlined above, we will evaluate further.  Other allergic rhinitis Stable.  Continue appropriate allergen avoidance measures and fluticasone nasal spray as needed.  Levocetirizine has been prescribed (as above).   Return if symptoms worsen or fail to improve.

## 2016-11-26 ENCOUNTER — Encounter: Payer: Self-pay | Admitting: Family

## 2016-11-26 ENCOUNTER — Ambulatory Visit (INDEPENDENT_AMBULATORY_CARE_PROVIDER_SITE_OTHER): Admitting: Family

## 2016-11-26 VITALS — BP 146/105 | HR 73 | Temp 97.7°F | Resp 18 | Ht 73.0 in | Wt 309.0 lb

## 2016-11-26 DIAGNOSIS — E785 Hyperlipidemia, unspecified: Secondary | ICD-10-CM | POA: Diagnosis not present

## 2016-11-26 DIAGNOSIS — I10 Essential (primary) hypertension: Secondary | ICD-10-CM

## 2016-11-26 DIAGNOSIS — E118 Type 2 diabetes mellitus with unspecified complications: Secondary | ICD-10-CM

## 2016-11-26 DIAGNOSIS — K649 Unspecified hemorrhoids: Secondary | ICD-10-CM | POA: Diagnosis not present

## 2016-11-26 LAB — BASIC METABOLIC PANEL
BUN: 9 mg/dL (ref 6–23)
CALCIUM: 9.6 mg/dL (ref 8.4–10.5)
CO2: 24 mEq/L (ref 19–32)
Chloride: 106 mEq/L (ref 96–112)
Creatinine, Ser: 0.96 mg/dL (ref 0.40–1.50)
GFR: 106.46 mL/min (ref >60.00)
GLUCOSE: 100 mg/dL — AB (ref 70–99)
Potassium: 4 mEq/L (ref 3.5–5.1)
Sodium: 138 mEq/L (ref 135–145)

## 2016-11-26 LAB — LIPID PANEL
Cholesterol: 96 mg/dL (ref 0–200)
HDL: 33 mg/dL — ABNORMAL LOW (ref >39.00)
LDL Cholesterol: 48 mg/dL (ref 0–99)
NONHDL: 63.45
Total CHOL/HDL Ratio: 3
Triglycerides: 79 mg/dL (ref 0.0–149.0)
VLDL: 15.8 mg/dL (ref 0.0–40.0)

## 2016-11-26 LAB — HEMOGLOBIN A1C: HEMOGLOBIN A1C: 6.5 % (ref 4.6–6.5)

## 2016-11-26 MED ORDER — HYDRALAZINE HCL 50 MG PO TABS: 50.0000 mg | ORAL_TABLET | Freq: Two times a day (BID) | ORAL | 1 refills | Status: DC

## 2016-11-26 MED ORDER — GLUCOSE BLOOD VI STRP: ORAL_STRIP | 1 refills | Status: DC

## 2016-11-26 MED ORDER — ACCU-CHEK GUIDE W/DEVICE KIT: 1.0000 | PACK | Freq: Every day | 0 refills | Status: DC

## 2016-11-26 NOTE — Progress Notes (Signed)
Subjective:    Patient ID: Charles Nash., male    DOB: 1965/10/14, 51 y.o.   MRN: [id number]  HPI  Patient is a 51 year old male who presents today for follow-up of multiple medical problems.  1) dm2- maintained on diet alone Lab Results  Component Value Date   HGBA1C 6.8 (H) 04/03/2016   HGBA1C 6.6 (H) 01/01/2016   HGBA1C 7.3 (H) 07/31/2015   Lab Results  Component Value Date   MICROALBUR 2.3 (H) 01/26/2015   LDLCALC 66 04/03/2016   CREATININE 0.96 04/03/2016   2) HTN- maintained on tribenzor.  BP Readings from Last 3 Encounters:  11/26/16 (!) 146/91  11/10/16 120/80  10/27/16 (!) 142/82   3) Hemorrhoids-Reports that he had a colonoscopy back in July. Was told by GI that he has some small hemorrhoids. He has been using Preparation H. Denies straining.  4) Morbid obesity- The patient underwent a laparoscopic sleeve gastrectomy on 10/29/2016. Diet is low fat meat/fish, green beans, mashed potatoes, tuna/tofu, scrambled eggs,  Banana.  Fresh (skin free fruit), yogurt.   Will be starting phase 2 of a soft food diet.   Wt Readings from Last 3 Encounters:  11/26/16 (!) 309 lb (140.2 kg)  11/10/16 (!) 322 lb 4.8 oz (146.2 kg)  10/27/16 (!) 343 lb 9.6 oz (155.9 kg)   5) Hyperlipidemia- maintained on lipitor.  Denies myalgia.   Lab Results  Component Value Date   CHOL 130 04/03/2016   HDL 50.40 04/03/2016   LDLCALC 66 04/03/2016   TRIG 67.0 04/03/2016   CHOLHDL 3 04/03/2016   Review of Systems See HPI  Past Medical History:  Diagnosis Date  . ADHD (attention deficit hyperactivity disorder)   . Allergy   . Arthritis   . Bilateral varicoceles   . Depression   . Diabetes mellitus   . Fatty liver 08/06/2011  . Genital herpes 10/25/2014  . GERD (gastroesophageal reflux disease)   . Hyperlipidemia   . Hypertension   . OSA (obstructive sleep apnea)   . Personal history of colonic polyps   . Plantar fasciitis   . PTSD (post-traumatic stress disorder)   . Sleep  apnea      Social History   Social History  . Marital status: Divorced    Spouse name: N/A  . Number of children: 2  . Years of education: N/A   Occupational History  . [employer]   Social History Main Topics  . Smoking status: Never Smoker  . Smokeless tobacco: Never Used  . Alcohol use No     Comment: occasional wine  . Drug use: No  . Sexual activity: No   Other Topics Concern  . Not on file   Social History Narrative   Regular exercise:  No   Caffeine use:  2--32oz cups daily          Past Surgical History:  Procedure Laterality Date  . ANKLE SURGERY    . DOPPLER ECHOCARDIOGRAPHY  2010  . FOOT SURGERY    . KNEE SURGERY  08/04/08   Right knee-- medial meniscus repair, attenuation anterior cruciate [name] MD [facility])   . NM MYOVIEW LTD  2010  . PLANTAR FASCIA RELEASE    . SHOULDER SURGERY    . SLEEVE GASTROPLASTY  10/29/2016  . VARICOCELE EXCISION      Family History  Problem Relation Age of Onset  . Diabetes Mother   . Hypertension Mother   . Arthritis Mother   .  Cancer Mother        uterine and breast cancer, sarcoma of the brain  . Hyperlipidemia Father   . Arthritis Father   . Cancer Father        thyroid, prostate cancer  . Other Father        mass in stomach  . Hyperlipidemia Maternal Grandmother   . Hypertension Maternal Grandmother   . Hyperlipidemia Maternal Grandfather   . Hypertension Maternal Grandfather   . Hyperlipidemia Paternal Grandmother   . Hypertension Paternal Grandmother   . Hyperlipidemia Paternal Grandfather   . Hypertension Paternal Grandfather   . Heart attack Paternal Grandfather   . Sudden death Neg Hx   . Colon cancer Neg Hx   . Esophageal cancer Neg Hx   . Stomach cancer Neg Hx   . Rectal cancer Neg Hx     Allergies  Allergen Reactions  . Oxycodone-Acetaminophen Hives and Itching  . Zoloft [Sertraline Hcl]     ?weight gain. "made me feel weird"    Current  Outpatient Prescriptions on File Prior to Visit  Medication Sig Dispense Refill  . atorvastatin (LIPITOR) 10 MG tablet TAKE 1 TABLET DAILY 90 tablet 1  . buPROPion (WELLBUTRIN XL) 150 MG 24 hr tablet TAKE 1 TABLET DAILY 90 tablet 1  . cetirizine (ZYRTEC) 10 MG tablet Take by mouth.    . fexofenadine (ALLEGRA) 180 MG tablet Take 1 tablet (180 mg total) by mouth daily as needed for allergies or rhinitis. 30 tablet 2  . fluticasone (FLONASE) 50 MCG/ACT nasal spray Place 2 sprays into both nostrils daily. 16 g 6  . hydrALAZINE (APRESOLINE) 50 MG tablet Take 1 tablet (50 mg total) by mouth 2 (two) times daily. (Patient taking differently: Take 25 mg by mouth daily. ) 180 tablet 1  . KLOR-CON M20 20 MEQ tablet TAKE 1 TABLET DAILY 90 tablet 1  . Lancets (FREESTYLE) lancets USE TO CHECK BLOOD SUGAR ONCE DAILY 100 each 1  . metoprolol succinate (TOPROL XL) 50 MG 24 hr tablet Take 2 tablets (100 mg total) by mouth daily. PLEASE CONTACT OFFICE FOR ADDITIONAL REFILLS 2ND ATTEMPT 135 tablet 0  . montelukast (SINGULAIR) 10 MG tablet Take 1 tablet (10 mg total) by mouth at bedtime. 30 tablet 3  . Olmesartan-Amlodipine-HCTZ 40-10-25 MG TABS TAKE 1 TABLET DAILY 90 tablet 1  . omeprazole (PRILOSEC) 20 MG capsule TAKE 2 CAPSULES EVERY MORNING 180 capsule 1  . polyethylene glycol (MIRALAX / GLYCOLAX) packet     . PROAIR HFA 108 (90 Base) MCG/ACT inhaler Inhale 2 puffs into the lungs 4 (four) times daily as needed.    . Pyridoxine HCl (B-6 PO) Take 1 tablet by mouth daily.    Marland Kitchen triamcinolone ointment (KENALOG) 0.1 % Apply to red itchy areas only below the face 80 g 3  . VIAGRA 100 MG tablet TAKE ONE-HALF (1/2) TO 1 TABLET DAILY AS NEEDED 15 tablet 1  . vitamin C (ASCORBIC ACID) 500 MG tablet Take 500 mg by mouth daily.    Marland Kitchen zolpidem (AMBIEN) 10 MG tablet Take 1 tablet (10 mg total) by mouth daily. 90 tablet 0  . [DISCONTINUED] potassium chloride (K-DUR) 10 MEQ tablet Take 1 tablet (10 mEq total) by mouth daily. 30  tablet 1   Current Facility-Administered Medications on File Prior to Visit  Medication Dose Route Frequency Provider Last Rate Last Dose  . 0.9 %  sodium chloride infusion  500 mL Intravenous Continuous Ladene Artist, MD  BP (!) 146/91 (BP Location: Left Arm, Cuff Size: Large)   Pulse 73   Temp 97.7 F (36.5 C) (Oral)   Resp 18   Ht 6\' 1"  (1.854 m)   Wt (!) 309 lb (140.2 kg)   SpO2 99%   BMI 40.77 kg/m       Objective:   Physical Exam  Constitutional: He is oriented to person, place, and time. He appears well-developed and well-nourished. No distress.  HENT:  Head: Normocephalic and atraumatic.  Cardiovascular: Normal rate and regular rhythm.   No murmur heard. Pulmonary/Chest: Effort normal and breath sounds normal. No respiratory distress. He has no wheezes. He has no rales.  Abdominal: Soft. Bowel sounds are normal. He exhibits no distension. There is no tenderness.  Musculoskeletal: He exhibits no edema.  Neurological: He is alert and oriented to person, place, and time.  Skin: Skin is warm and dry.  Laparoscopic incisions healing well without drainge or erythema.  Psychiatric: He has a normal mood and affect. His behavior is normal. Thought content normal.          Assessment & Plan:   Diabetes type 2 well controlled off of meds. Should continue to improve with ongoing weight loss.  Hypertension-uncontrolled. Advised patient to continue try tensor and increase hydralazine from half tablet twice daily to a full tablet twice daily.  Morbid obesity-he has lost approximately 24 pounds in the last 1 month. He is working hard on his bariatric diet following his gastric sleeve. Encourage patient to keep up the good work.  Hyperlipidemia- tolerating statin continue same  Hemorrhoids-she reports no straining. Advised patient to continue MiraLAX as needed. Continue Preparation H as needed and also to consider Epsom salt soaks once daily in the tub.

## 2016-11-26 NOTE — Patient Instructions (Addendum)
Please complete lab work prior to leaving. Increase hydralazine to a full tablet twice daily.

## 2016-11-27 ENCOUNTER — Encounter: Payer: Self-pay | Admitting: Family

## 2016-12-07 ENCOUNTER — Other Ambulatory Visit: Payer: Self-pay | Admitting: Family

## 2016-12-08 ENCOUNTER — Encounter: Payer: Self-pay | Admitting: Family

## 2016-12-08 ENCOUNTER — Telehealth: Payer: Self-pay | Admitting: Family

## 2016-12-08 ENCOUNTER — Other Ambulatory Visit: Payer: Self-pay | Admitting: Family

## 2016-12-08 ENCOUNTER — Ambulatory Visit: Admitting: Family

## 2016-12-08 NOTE — Telephone Encounter (Signed)
I completed that form several weeks ago with attached download and placed it in [employer].

## 2016-12-08 NOTE — Telephone Encounter (Signed)
Caller name:[place] [employer] Relationship to patient: Can be reached:[phone] Pharmacy:  Reason for call:Checking status of DMV paperwork, states it was given to [place] in July and [place] states they do not have it. Please Arnoldo Hooker

## 2016-12-08 NOTE — Telephone Encounter (Signed)
Charles Nash-- there was mention in 11/01/16 phone note that form was almost complete but we were needing most recent CPAP download. Do you still have the form?

## 2016-12-08 NOTE — Telephone Encounter (Signed)
Charles Nash-- do you still a copy?

## 2016-12-09 NOTE — Telephone Encounter (Signed)
Per 11/01/16 phone note, Charles Nash had this paperwork working on it, she may have p/u from red folder. Do you have copy of paperwork?Marland Kitchen Thanks/SLS 08/28

## 2016-12-09 NOTE — Telephone Encounter (Signed)
Spoke with pt and he reports headache / vomiting is resolved today and feels much better. Discussed signs that would indicate ER evaluation if headache returns and is severe, otherwise call for office visit if symptoms return and he voices understanding.

## 2016-12-09 NOTE — Telephone Encounter (Signed)
Charles Nash-- Please see below and advise?  Pt called stating his license will be revoked if paperwork not submitted by 12/11/16 to [facility]. Advised pt I am checking on status and to see if DMV will give him an extension?

## 2016-12-10 ENCOUNTER — Other Ambulatory Visit: Payer: Self-pay | Admitting: Family

## 2016-12-10 MED ORDER — HYDRALAZINE HCL 50 MG PO TABS: 50.0000 mg | ORAL_TABLET | Freq: Three times a day (TID) | ORAL | 1 refills | Status: DC

## 2016-12-11 NOTE — Telephone Encounter (Signed)
Paperwork was originally faxed on 11/18/16.  Fax confirmation was received.  Paperwork re-faxed.  Fax confirmation received.  Will follow up.

## 2016-12-11 NOTE — Telephone Encounter (Addendum)
Called patient to make him aware that paperwork was faxed on 11/18/16 and was re-faxed today.  He stated that he was given an extension 01/25/17, but will checked to make sure they received paperwork.  He asked that I add his DL # to the fax cover sheet. Stating that they may need it to identify the paperwork as his.  Refaxed paperwork again with patient's DL# on the fax cover sheet.  Fax confirmation received.

## 2016-12-16 LAB — HM DIABETES EYE EXAM

## 2016-12-25 ENCOUNTER — Telehealth: Payer: Self-pay | Admitting: *Deleted

## 2016-12-25 NOTE — Telephone Encounter (Signed)
Opened in error/SLS

## 2016-12-26 ENCOUNTER — Encounter: Payer: Self-pay | Admitting: Family

## 2016-12-26 ENCOUNTER — Ambulatory Visit (INDEPENDENT_AMBULATORY_CARE_PROVIDER_SITE_OTHER): Admitting: Family

## 2016-12-26 VITALS — BP 133/85 | HR 72 | Temp 98.2°F | Resp 16 | Ht 73.0 in | Wt 288.0 lb

## 2016-12-26 DIAGNOSIS — I10 Essential (primary) hypertension: Secondary | ICD-10-CM | POA: Diagnosis not present

## 2016-12-26 DIAGNOSIS — Z23 Encounter for immunization: Secondary | ICD-10-CM

## 2016-12-26 MED ORDER — BUPROPION HCL ER (XL) 300 MG PO TB24: 300.0000 mg | ORAL_TABLET | Freq: Every day | ORAL | Status: AC

## 2016-12-26 NOTE — Patient Instructions (Signed)
Please continue current dose of your blood pressure medications.

## 2016-12-26 NOTE — Progress Notes (Signed)
Subjective:    Patient ID: Charles Courier., male    DOB: 10/07/1965, 51 y.o.   MRN: [id number]  HPI  Charles Nash is a 51 yr old male who presents today for follow up of his hypertension.  Last visit BP was elevated and we increased his hydralazine 1/2 tab bid to a full tab bid. He has lost 21 pounds since his last visit. He is s/p gastric sleep 7/18. He denies swelling.    BP Readings from Last 3 Encounters:  12/26/16 133/85  11/26/16 (!) 146/105  11/10/16 120/80   Wt Readings from Last 3 Encounters:  12/26/16 288 lb (130.6 kg)  11/26/16 (!) 309 lb (140.2 kg)  11/10/16 (!) 322 lb 4.8 oz (146.2 kg)     Review of Systems See HPI  Past Medical History:  Diagnosis Date  . ADHD (attention deficit hyperactivity disorder)   . Allergy   . Arthritis   . Bilateral varicoceles   . Depression   . Diabetes mellitus   . Fatty liver 08/06/2011  . Genital herpes 10/25/2014  . GERD (gastroesophageal reflux disease)   . Hyperlipidemia   . Hypertension   . OSA (obstructive sleep apnea)   . Personal history of colonic polyps   . Plantar fasciitis   . PTSD (post-traumatic stress disorder)   . Sleep apnea      Social History   Social History  . Marital status: Divorced    Spouse name: N/A  . Number of children: 2  . Years of education: N/A   Occupational History  . [employer]   Social History Main Topics  . Smoking status: Never Smoker  . Smokeless tobacco: Never Used  . Alcohol use No     Comment: occasional wine  . Drug use: No  . Sexual activity: No   Other Topics Concern  . Not on file   Social History Narrative   Regular exercise:  No   Caffeine use:  2--32oz cups daily          Past Surgical History:  Procedure Laterality Date  . ANKLE SURGERY    . DOPPLER ECHOCARDIOGRAPHY  2010  . FOOT SURGERY    . KNEE SURGERY  08/04/08   Right knee-- medial meniscus repair, attenuation anterior cruciate [name] MD [facility])     . NM MYOVIEW LTD  2010  . PLANTAR FASCIA RELEASE    . SHOULDER SURGERY    . SLEEVE GASTROPLASTY  10/29/2016  . VARICOCELE EXCISION      Family History  Problem Relation Age of Onset  . Diabetes Mother   . Hypertension Mother   . Arthritis Mother   . Cancer Mother        uterine and breast cancer, sarcoma of the brain  . Hyperlipidemia Father   . Arthritis Father   . Cancer Father        thyroid, prostate cancer  . Other Father        mass in stomach  . Hyperlipidemia Maternal Grandmother   . Hypertension Maternal Grandmother   . Hyperlipidemia Maternal Grandfather   . Hypertension Maternal Grandfather   . Hyperlipidemia Paternal Grandmother   . Hypertension Paternal Grandmother   . Hyperlipidemia Paternal Grandfather   . Hypertension Paternal Grandfather   . Heart attack Paternal Grandfather   . Sudden death Neg Hx   . Colon cancer Neg Hx   . Esophageal cancer Neg Hx   . Stomach cancer Neg Hx   .  Rectal cancer Neg Hx     Allergies  Allergen Reactions  . Oxycodone-Acetaminophen Hives and Itching  . Zoloft [Sertraline Hcl]     ?weight gain. "made me feel weird"    Current Outpatient Prescriptions on File Prior to Visit  Medication Sig Dispense Refill  . atorvastatin (LIPITOR) 10 MG tablet TAKE 1 TABLET DAILY 90 tablet 1  . Blood Glucose Monitoring Suppl (ACCU-CHEK GUIDE) w/Device KIT 1 each by Does not apply route daily. 1 kit 0  . buPROPion (WELLBUTRIN XL) 150 MG 24 hr tablet TAKE 1 TABLET DAILY 90 tablet 1  . CALCIUM-MAGNESIUM-VITAMIN D PO Take 1 tablet by mouth 3 (three) times daily.    . cetirizine (ZYRTEC) 10 MG tablet Take by mouth.    . Cholecalciferol (VITAMIN D3) 5000 UNIT/ML LIQD Take 5,000 Units by mouth daily.    . Cobalamine Combinations (B-12 + FOLIC ACID PO) Take by mouth. 1/2 dropper ful once a day.    . fexofenadine (ALLEGRA) 180 MG tablet Take 1 tablet (180 mg total) by mouth daily as needed for allergies or rhinitis. 30 tablet 2  . fluticasone  (FLONASE) 50 MCG/ACT nasal spray Place 2 sprays into both nostrils daily. 16 g 6  . glucose blood (ACCU-CHEK GUIDE) test strip Use as instructed 100 each 1  . hydrALAZINE (APRESOLINE) 50 MG tablet Take 1 tablet (50 mg total) by mouth 3 (three) times daily. 180 tablet 1  . KLOR-CON M20 20 MEQ tablet TAKE 1 TABLET DAILY 90 tablet 1  . Lancets (FREESTYLE) lancets USE TO CHECK BLOOD SUGAR ONCE DAILY 100 each 1  . metoprolol succinate (TOPROL XL) 50 MG 24 hr tablet Take 2 tablets (100 mg total) by mouth daily. PLEASE CONTACT OFFICE FOR ADDITIONAL REFILLS 2ND ATTEMPT 135 tablet 0  . montelukast (SINGULAIR) 10 MG tablet Take 1 tablet (10 mg total) by mouth at bedtime. 30 tablet 3  . Multiple Vitamin (MULTI-VITAMINS) TABS Take 1 tablet by mouth daily.    . Olmesartan-Amlodipine-HCTZ 40-10-25 MG TABS TAKE 1 TABLET DAILY 90 tablet 1  . omeprazole (PRILOSEC) 20 MG capsule TAKE 2 CAPSULES EVERY MORNING 180 capsule 1  . polyethylene glycol (MIRALAX / GLYCOLAX) packet     . PROAIR HFA 108 (90 Base) MCG/ACT inhaler Inhale 2 puffs into the lungs 4 (four) times daily as needed.    . Pyridoxine HCl (B-6 PO) Take 1 tablet by mouth daily.    Marland Kitchen triamcinolone ointment (KENALOG) 0.1 % Apply to red itchy areas only below the face 80 g 3  . VIAGRA 100 MG tablet TAKE ONE-HALF (1/2) TO 1 TABLET DAILY AS NEEDED 15 tablet 1  . vitamin C (ASCORBIC ACID) 500 MG tablet Take 500 mg by mouth daily.    Marland Kitchen zolpidem (AMBIEN) 10 MG tablet Take 1 tablet (10 mg total) by mouth daily. 90 tablet 0  . [DISCONTINUED] potassium chloride (K-DUR) 10 MEQ tablet Take 1 tablet (10 mEq total) by mouth daily. 30 tablet 1   Current Facility-Administered Medications on File Prior to Visit  Medication Dose Route Frequency Provider Last Rate Last Dose  . 0.9 %  sodium chloride infusion  500 mL Intravenous Continuous Meryl Dare, MD        BP 133/85 (BP Location: Right Arm, Cuff Size: Large)   Pulse 72   Temp 98.2 F (36.8 C) (Oral)   Resp  16   Ht 6\' 1"  (1.854 m)   Wt 288 lb (130.6 kg)   SpO2 99%   BMI  38.00 kg/m       Objective:   Physical Exam  Constitutional: He is oriented to person, place, and time. He appears well-developed and well-nourished. No distress.  HENT:  Head: Normocephalic and atraumatic.  Cardiovascular: Normal rate and regular rhythm.   No murmur heard. Pulmonary/Chest: Effort normal and breath sounds normal. No respiratory distress. He has no wheezes. He has no rales.  Musculoskeletal: He exhibits no edema.  Neurological: He is alert and oriented to person, place, and time.  Skin: Skin is warm and dry.  Psychiatric: He has a normal mood and affect. His behavior is normal. Thought content normal.          Assessment & Plan:  HTN-improved. Continue increased dose of hydralazine, + tribenzor.  Morbid Obesity- I commended the patient on his weight loss.  He is now exercising as well.

## 2016-12-27 ENCOUNTER — Emergency Department (HOSPITAL_BASED_OUTPATIENT_CLINIC_OR_DEPARTMENT_OTHER)
Admission: EM | Admit: 2016-12-27 | Discharge: 2016-12-27 | Disposition: A | Discharge status: Home / Self Care | Attending: Emergency Medicine | Admitting: Emergency Medicine

## 2016-12-27 ENCOUNTER — Encounter (HOSPITAL_BASED_OUTPATIENT_CLINIC_OR_DEPARTMENT_OTHER): Payer: Self-pay

## 2016-12-27 DIAGNOSIS — E1142 Type 2 diabetes mellitus with diabetic polyneuropathy: Secondary | ICD-10-CM | POA: Insufficient documentation

## 2016-12-27 DIAGNOSIS — Z79899 Other long term (current) drug therapy: Secondary | ICD-10-CM | POA: Insufficient documentation

## 2016-12-27 DIAGNOSIS — M546 Pain in thoracic spine: Secondary | ICD-10-CM | POA: Diagnosis present

## 2016-12-27 DIAGNOSIS — I10 Essential (primary) hypertension: Secondary | ICD-10-CM | POA: Diagnosis not present

## 2016-12-27 DIAGNOSIS — M62830 Muscle spasm of back: Secondary | ICD-10-CM | POA: Insufficient documentation

## 2016-12-27 NOTE — ED Provider Notes (Signed)
[facility] Provider Note   CSN: [id number] Arrival date & time: 12/27/16  1051     History   Chief Complaint Chief Complaint  Patient presents with  . Back Pain   H/o DDD in the neck used to get steroid injections. Has not gotten them in yrs.   HPI Charles Nash. is a 51 y.o. male.  The history is provided by the patient.  Back Pain   This is a recurrent problem. Episode onset: 4 days. Episode frequency: intermittent. The problem has not changed (not feeling the pain at this moment, but can bring it on with certain positions) since onset.The pain is associated with twisting. The pain is present in the thoracic spine. Quality: "stretching" The pain does not radiate. The pain is moderate. The symptoms are aggravated by certain positions. Pertinent negatives include no chest pain, no fever, no headaches, no bowel incontinence, no perianal numbness, no bladder incontinence, no paresthesias and no tingling. He has tried analgesics (significant improvement with hydromassage) for the symptoms. The treatment provided mild relief.    Past Medical History:  Diagnosis Date  . ADHD (attention deficit hyperactivity disorder)   . Allergy   . Arthritis   . Bilateral varicoceles   . Depression   . Fatty liver 08/06/2011  . Genital herpes 10/25/2014  . GERD (gastroesophageal reflux disease)   . Hyperlipidemia   . Hypertension   . OSA (obstructive sleep apnea)   . Personal history of colonic polyps   . Plantar fasciitis   . PTSD (post-traumatic stress disorder)   . Sleep apnea     Patient Active Problem List   Diagnosis Date Noted  . Insect bites 11/10/2016  . Diabetic polyneuropathy associated with type 2 diabetes mellitus ([place]) 08/25/2016  . Achilles tendinitis, left leg 08/25/2016  . Achilles tendon contracture, left 08/25/2016  . Xerosis of skin 04/22/2016  . Chronic urticaria 03/05/2016  . Sacroiliac joint dysfunction of both sides 01/01/2016  . Anxiety state  08/20/2015  . 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 Patellofemoral disorder 05/15/2015  . Current use of beta blocker 05/15/2015  . Genital herpes 10/25/2014  . Depression 10/05/2014  . Osteoarthritis of left knee 08/15/2014  . 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 Sciatica 04/22/2014  . Osteoarthritis of right knee 02/06/2014  . Allergy to mold 12/13/2013  . Weight gain, abnormal 11/08/2013  . PTSD (post-traumatic stress disorder) 07/05/2013  . Multinodular goiter 01/19/2013  . GERD (gastroesophageal reflux disease) 08/09/2012  . Dermographia 12/29/2011  . Neck pain 11/12/2011  . Fatty liver 08/06/2011  . Cervical disc disease 07/28/2011  . Preventative health care 07/28/2011  . Erectile dysfunction 05/23/2011  . Elevated liver function tests 05/20/2011  . Type II diabetes mellitus, well controlled ([place]) 05/19/2011  . Essential hypertension 05/19/2011  . Hyperlipidemia 05/19/2011  . Morbid obesity ([place]) 05/19/2011  . Other allergic rhinitis 05/19/2011  . Obstructive sleep apnea treated with continuous positive airway pressure (CPAP) 05/19/2011    Past Surgical History:  Procedure Laterality Date  . ANKLE SURGERY    . DOPPLER ECHOCARDIOGRAPHY  2010  . FOOT SURGERY    . KNEE SURGERY  08/04/08   Right knee-- medial meniscus repair, attenuation anterior cruciate [name] MD [facility])   . NM MYOVIEW LTD  2010  . PLANTAR FASCIA RELEASE    . SHOULDER SURGERY    . SLEEVE GASTROPLASTY  10/29/2016  . [place]  Medications    Prior to Admission medications   Medication Sig Start Date End Date Taking? Authorizing Provider  atorvastatin (LIPITOR) 10 MG tablet TAKE 1 TABLET DAILY 08/18/16   Sandford Craze, NP  Blood Glucose Monitoring Suppl (ACCU-CHEK GUIDE) w/Device KIT 1 each by Does not apply route daily. 11/26/16   Sandford Craze, NP  buPROPion (WELLBUTRIN XL) 300 MG 24 hr tablet Take 1 tablet  (300 mg total) by mouth daily. 12/26/16   Sandford Craze, NP  CALCIUM-MAGNESIUM-VITAMIN D PO Take 1 tablet by mouth 3 (three) times daily.    [provider]  cetirizine (ZYRTEC) 10 MG tablet Take by mouth. 06/26/15   [provider]  Cholecalciferol (VITAMIN D3) 5000 UNIT/ML LIQD Take 5,000 Units by mouth daily.    [provider]  Cobalamine Combinations (B-12 + FOLIC ACID PO) Take by mouth. 1/2 dropper ful once a day.    [provider]  fexofenadine (ALLEGRA) 180 MG tablet Take 1 tablet (180 mg total) by mouth daily as needed for allergies or rhinitis. 11/10/16   Bobbitt, Heywood Iles, MD  fluticasone (FLONASE) 50 MCG/ACT nasal spray Place 2 sprays into both nostrils daily. 09/09/16   Sandford Craze, NP  glucose blood (ACCU-CHEK GUIDE) test strip Use as instructed 11/26/16   Sandford Craze, NP  hydrALAZINE (APRESOLINE) 50 MG tablet Take 1 tablet (50 mg total) by mouth 3 (three) times daily. 12/10/16   Sandford Craze, NP  KLOR-CON M20 20 MEQ tablet TAKE 1 TABLET DAILY 08/07/16   Sandford Craze, NP  Lancets (FREESTYLE) lancets USE TO CHECK BLOOD SUGAR ONCE DAILY 04/02/15   Sandford Craze, NP  metoprolol succinate (TOPROL XL) 50 MG 24 hr tablet Take 2 tablets (100 mg total) by mouth daily. PLEASE CONTACT OFFICE FOR ADDITIONAL REFILLS 2ND ATTEMPT 12/18/15   Hilty, Lisette Abu, MD  montelukast (SINGULAIR) 10 MG tablet Take 1 tablet (10 mg total) by mouth at bedtime. 09/24/16   Sandford Craze, NP  Multiple Vitamin (MULTI-VITAMINS) TABS Take 1 tablet by mouth daily.    [provider]  Olmesartan-Amlodipine-HCTZ 40-10-25 MG TABS TAKE 1 TABLET DAILY 07/31/16   Sandford Craze, NP  omeprazole (PRILOSEC) 20 MG capsule TAKE 2 CAPSULES EVERY MORNING 12/08/16   Sandford Craze, NP  polyethylene glycol (MIRALAX / GLYCOLAX) packet  10/30/16   [provider]  PROAIR HFA 108 (90 Base) MCG/ACT inhaler Inhale 2 puffs into the lungs  4 (four) times daily as needed. 09/29/16   [provider]  Pyridoxine HCl (B-6 PO) Take 1 tablet by mouth daily.    [provider]  triamcinolone ointment (KENALOG) 0.1 % Apply to red itchy areas only below the face 04/22/16   Fletcher Anon, MD  VIAGRA 100 MG tablet TAKE ONE-HALF (1/2) TO 1 TABLET DAILY AS NEEDED 04/25/16   Sandford Craze, NP  vitamin C (ASCORBIC ACID) 500 MG tablet Take 500 mg by mouth daily.    [provider]  zolpidem (AMBIEN) 10 MG tablet Take 1 tablet (10 mg total) by mouth daily. 10/13/16   Sandford Craze, NP    Family History Family History  Problem Relation Age of Onset  . Diabetes Mother   . Hypertension Mother   . Arthritis Mother   . Cancer Mother        uterine and breast cancer, sarcoma of the brain  . Hyperlipidemia Father   . Arthritis Father   . Cancer Father        thyroid, prostate cancer  . Other  Father        mass in stomach  . Hyperlipidemia Maternal Grandmother   . Hypertension Maternal Grandmother   . Hyperlipidemia Maternal Grandfather   . Hypertension Maternal Grandfather   . Hyperlipidemia Paternal Grandmother   . Hypertension Paternal Grandmother   . Hyperlipidemia Paternal Grandfather   . Hypertension Paternal Grandfather   . Heart attack Paternal Grandfather   . Sudden death Neg Hx   . Colon cancer Neg Hx   . Esophageal cancer Neg Hx   . Stomach cancer Neg Hx   . Rectal cancer Neg Hx     Social History Social History  Substance Use Topics  . Smoking status: Never Smoker  . Smokeless tobacco: Never Used  . Alcohol use No     Comment: occasional wine     Allergies   Oxycodone-acetaminophen and Zoloft [sertraline hcl]   Review of Systems Review of Systems  Constitutional: Negative for fever.  Cardiovascular: Negative for chest pain.  Gastrointestinal: Negative for bowel incontinence.  Genitourinary: Negative for bladder incontinence.  Musculoskeletal: Positive for back pain.    Neurological: Negative for tingling, headaches and paresthesias.  All other systems are reviewed and are negative for acute change except as noted in the HPI    Physical Exam Updated Vital Signs BP (!) 147/101   Pulse 66   Temp 98.4 F (36.9 C) (Oral)   Resp 18   Ht 6\' 1"  (1.854 m)   Wt 130.6 kg (288 lb)   SpO2 100%   BMI 38.00 kg/m   Physical Exam  Constitutional: He is oriented to person, place, and time. He appears well-developed and well-nourished. No distress.  HENT:  Head: Normocephalic and atraumatic.  Nose: Nose normal.  Eyes: Pupils are equal, round, and reactive to light. Conjunctivae and EOM are normal. Right eye exhibits no discharge. Left eye exhibits no discharge. No scleral icterus.  Neck: Normal range of motion. Neck supple.  Cardiovascular: Normal rate and regular rhythm.  Exam reveals no gallop and no friction rub.   No murmur heard. Pulmonary/Chest: Effort normal and breath sounds normal. No stridor. No respiratory distress. He has no rales.  Abdominal: Soft. He exhibits no distension. There is no tenderness.  Musculoskeletal: He exhibits no edema or tenderness.  Neurological: He is alert and oriented to person, place, and time.  Skin: Skin is warm and dry. No rash noted. He is not diaphoretic. No erythema.  Psychiatric: He has a normal mood and affect.  Vitals reviewed.    ED Treatments / Results  Labs (all labs ordered are listed, but only abnormal results are displayed) Labs Reviewed - No data to display  EKG  EKG Interpretation None       Radiology No results found.  Procedures Procedures (including critical care time)  Medications Ordered in ED Medications - No data to display   Initial Impression / Assessment and Plan / ED Course  I have reviewed the triage vital signs and the nursing notes.  Pertinent labs & imaging results that were available during my care of the patient were reviewed by me and considered in my medical  decision making (see chart for details).     Consistent with muscle strain/spasm. Symptomatic treatment recommended.   Low suspicion for cardiovascular/pulmonary process.  The patient is safe for discharge with strict return precautions.   Final Clinical Impressions(s) / ED Diagnoses   Final diagnoses:  Muscle spasm of back   Disposition: Discharge  Condition: Good  I have discussed the  results, Dx and Tx plan with the patient who expressed understanding and agree(s) with the plan. Discharge instructions discussed at great length. The patient was given strict return precautions who verbalized understanding of the instructions. No further questions at time of discharge.    New Prescriptions   No medications on file    Follow Up: Debbrah Alar, NP [place] [address] [phone]  Schedule an appointment as soon as possible for a visit  in 5-7 days      Cardama, Grayce Sessions, MD 12/27/16 1229

## 2016-12-27 NOTE — ED Triage Notes (Signed)
Upper back pain started 12/24/16. Pain sharp and aching and rated at 5/10. Patient denies pain related to injury.

## 2016-12-27 NOTE — Discharge Instructions (Signed)
You may use over-the-counter Acetaminophen (Tylenol), topical muscle creams such as SalonPas, Icy Hot, Bengay, etc. Please stretch, apply heat, and have massage therapy for additional assistance.  

## 2016-12-29 ENCOUNTER — Encounter: Payer: Self-pay | Admitting: Family

## 2016-12-29 ENCOUNTER — Encounter: Payer: Self-pay | Admitting: Physical Medicine & Rehabilitation

## 2016-12-30 ENCOUNTER — Encounter: Payer: Self-pay | Admitting: Family

## 2016-12-30 ENCOUNTER — Ambulatory Visit (INDEPENDENT_AMBULATORY_CARE_PROVIDER_SITE_OTHER): Admitting: Family

## 2016-12-30 VITALS — BP 143/86 | HR 68 | Temp 97.7°F | Resp 18 | Ht 73.0 in | Wt 286.0 lb

## 2016-12-30 DIAGNOSIS — M545 Low back pain, unspecified: Secondary | ICD-10-CM

## 2016-12-30 DIAGNOSIS — F4310 Post-traumatic stress disorder, unspecified: Secondary | ICD-10-CM | POA: Diagnosis not present

## 2016-12-30 DIAGNOSIS — I10 Essential (primary) hypertension: Secondary | ICD-10-CM | POA: Diagnosis not present

## 2016-12-30 NOTE — Progress Notes (Signed)
Subjective:    Patient ID: Charles Courier., male    DOB: November 25, 1965, 51 y.o.   MRN: [id number]  HPI  Charles Nash is a 51 yr old male who presents today with c/o back spasms.  He reports improvement in back spasms. He plans to arrange follow up with Dr. Letta Pate.    HTN- Maintained  On tribenzor and hydalazine. BP Readings from Last 3 Encounters:  12/30/16 (!) 143/86  12/27/16 (!) 150/97  12/26/16 133/85      Review of Systems   See HPI  Past Medical History:  Diagnosis Date  . ADHD (attention deficit hyperactivity disorder)   . Allergy   . Arthritis   . Bilateral varicoceles   . Depression   . Fatty liver 08/06/2011  . Genital herpes 10/25/2014  . GERD (gastroesophageal reflux disease)   . Hyperlipidemia   . Hypertension   . OSA (obstructive sleep apnea)   . Personal history of colonic polyps   . Plantar fasciitis   . PTSD (post-traumatic stress disorder)   . Sleep apnea      Social History   Social History  . Marital status: Divorced    Spouse name: N/A  . Number of children: 2  . Years of education: N/A   Occupational History  . [employer]   Social History Main Topics  . Smoking status: Never Smoker  . Smokeless tobacco: Never Used  . Alcohol use No     Comment: occasional wine  . Drug use: No  . Sexual activity: No   Other Topics Concern  . Not on file   Social History Narrative   Regular exercise:  No   Caffeine use:  2--32oz cups daily          Past Surgical History:  Procedure Laterality Date  . ANKLE SURGERY    . DOPPLER ECHOCARDIOGRAPHY  2010  . FOOT SURGERY    . KNEE SURGERY  08/04/08   Right knee-- medial meniscus repair, attenuation anterior cruciate [name] [facility])   . NM MYOVIEW LTD  2010  . PLANTAR FASCIA RELEASE    . SHOULDER SURGERY    . SLEEVE GASTROPLASTY  10/29/2016  . VARICOCELE EXCISION      Family History  Problem Relation Age of Onset  . Diabetes Mother   .  Hypertension Mother   . Arthritis Mother   . Cancer Mother        uterine and breast cancer, sarcoma of the brain  . Hyperlipidemia Father   . Arthritis Father   . Cancer Father        thyroid, prostate cancer  . Other Father        mass in stomach  . Hyperlipidemia Maternal Grandmother   . Hypertension Maternal Grandmother   . Hyperlipidemia Maternal Grandfather   . Hypertension Maternal Grandfather   . Hyperlipidemia Paternal Grandmother   . Hypertension Paternal Grandmother   . Hyperlipidemia Paternal Grandfather   . Hypertension Paternal Grandfather   . Heart attack Paternal Grandfather   . Sudden death Neg Hx   . Colon cancer Neg Hx   . Esophageal cancer Neg Hx   . Stomach cancer Neg Hx   . Rectal cancer Neg Hx     Allergies  Allergen Reactions  . Oxycodone-Acetaminophen Hives and Itching  . Zoloft [Sertraline Hcl]     ?weight gain. "made me feel weird"    Current Outpatient Prescriptions on File Prior to Visit  Medication  Sig Dispense Refill  . atorvastatin (LIPITOR) 10 MG tablet TAKE 1 TABLET DAILY 90 tablet 1  . Blood Glucose Monitoring Suppl (ACCU-CHEK GUIDE) w/Device KIT 1 each by Does not apply route daily. 1 kit 0  . buPROPion (WELLBUTRIN XL) 300 MG 24 hr tablet Take 1 tablet (300 mg total) by mouth daily.    Marland Kitchen CALCIUM-MAGNESIUM-VITAMIN D PO Take 1 tablet by mouth 3 (three) times daily.    . cetirizine (ZYRTEC) 10 MG tablet Take by mouth.    . Cholecalciferol (VITAMIN D3) 5000 UNIT/ML LIQD Take 5,000 Units by mouth daily.    . Cobalamine Combinations (Z-76 + FOLIC ACID PO) Take by mouth. 1/2 dropper ful once a day.    . fexofenadine (ALLEGRA) 180 MG tablet Take 1 tablet (180 mg total) by mouth daily as needed for allergies or rhinitis. 30 tablet 2  . fluticasone (FLONASE) 50 MCG/ACT nasal spray Place 2 sprays into both nostrils daily. 16 g 6  . glucose blood (ACCU-CHEK GUIDE) test strip Use as instructed 100 each 1  . hydrALAZINE (APRESOLINE) 50 MG tablet Take  1 tablet (50 mg total) by mouth 3 (three) times daily. 180 tablet 1  . KLOR-CON M20 20 MEQ tablet TAKE 1 TABLET DAILY 90 tablet 1  . Lancets (FREESTYLE) lancets USE TO CHECK BLOOD SUGAR ONCE DAILY 100 each 1  . metoprolol succinate (TOPROL XL) 50 MG 24 hr tablet Take 2 tablets (100 mg total) by mouth daily. PLEASE CONTACT OFFICE FOR ADDITIONAL REFILLS 2ND ATTEMPT 135 tablet 0  . montelukast (SINGULAIR) 10 MG tablet Take 1 tablet (10 mg total) by mouth at bedtime. 30 tablet 3  . Multiple Vitamin (MULTI-VITAMINS) TABS Take 1 tablet by mouth daily.    . Olmesartan-Amlodipine-HCTZ 40-10-25 MG TABS TAKE 1 TABLET DAILY 90 tablet 1  . omeprazole (PRILOSEC) 20 MG capsule TAKE 2 CAPSULES EVERY MORNING 180 capsule 1  . polyethylene glycol (MIRALAX / GLYCOLAX) packet     . PROAIR HFA 108 (90 Base) MCG/ACT inhaler Inhale 2 puffs into the lungs 4 (four) times daily as needed.    . Pyridoxine HCl (B-6 PO) Take 1 tablet by mouth daily.    Marland Kitchen triamcinolone ointment (KENALOG) 0.1 % Apply to red itchy areas only below the face 80 g 3  . VIAGRA 100 MG tablet TAKE ONE-HALF (1/2) TO 1 TABLET DAILY AS NEEDED 15 tablet 1  . vitamin C (ASCORBIC ACID) 500 MG tablet Take 500 mg by mouth daily.    Marland Kitchen zolpidem (AMBIEN) 10 MG tablet Take 1 tablet (10 mg total) by mouth daily. 90 tablet 0  . [DISCONTINUED] potassium chloride (K-DUR) 10 MEQ tablet Take 1 tablet (10 mEq total) by mouth daily. 30 tablet 1   Current Facility-Administered Medications on File Prior to Visit  Medication Dose Route Frequency Provider Last Rate Last Dose  . 0.9 %  sodium chloride infusion  500 mL Intravenous Continuous Ladene Artist, MD        BP (!) 143/86 (BP Location: Left Arm, Cuff Size: Large)   Pulse 68   Temp 97.7 F (36.5 C) (Oral)   Resp 18   Ht '6\' 1"'$  (1.854 m)   Wt 286 lb (129.7 kg)   SpO2 98%   BMI 37.73 kg/m        Objective:   Physical Exam  Constitutional: He is oriented to person, place, and time. He appears  well-developed and well-nourished. No distress.  HENT:  Head: Normocephalic and atraumatic.  Cardiovascular: Normal  rate and regular rhythm.   No murmur heard. Pulmonary/Chest: Effort normal and breath sounds normal. No respiratory distress. He has no wheezes. He has no rales.  Musculoskeletal: He exhibits no edema.  Neurological: He is alert and oriented to person, place, and time.  Skin: Skin is warm and dry.  Psychiatric: He has a normal mood and affect. His behavior is normal. Thought content normal.          Assessment & Plan:  Low back pain- improving, follow up with Dr.  Letta Pate.

## 2017-01-01 ENCOUNTER — Encounter

## 2017-01-01 ENCOUNTER — Ambulatory Visit: Admitting: Physical Medicine & Rehabilitation

## 2017-01-02 ENCOUNTER — Encounter: Payer: Self-pay | Admitting: Physical Medicine & Rehabilitation

## 2017-01-02 ENCOUNTER — Encounter: Attending: Physical Medicine & Rehabilitation

## 2017-01-02 ENCOUNTER — Ambulatory Visit (HOSPITAL_BASED_OUTPATIENT_CLINIC_OR_DEPARTMENT_OTHER): Admitting: Physical Medicine & Rehabilitation

## 2017-01-02 VITALS — BP 143/96 | HR 73

## 2017-01-02 DIAGNOSIS — E785 Hyperlipidemia, unspecified: Secondary | ICD-10-CM | POA: Insufficient documentation

## 2017-01-02 DIAGNOSIS — G8929 Other chronic pain: Secondary | ICD-10-CM | POA: Insufficient documentation

## 2017-01-02 DIAGNOSIS — M47816 Spondylosis without myelopathy or radiculopathy, lumbar region: Secondary | ICD-10-CM | POA: Insufficient documentation

## 2017-01-02 DIAGNOSIS — G4733 Obstructive sleep apnea (adult) (pediatric): Secondary | ICD-10-CM | POA: Insufficient documentation

## 2017-01-02 DIAGNOSIS — M542 Cervicalgia: Secondary | ICD-10-CM | POA: Insufficient documentation

## 2017-01-02 DIAGNOSIS — M546 Pain in thoracic spine: Secondary | ICD-10-CM | POA: Diagnosis not present

## 2017-01-02 DIAGNOSIS — I10 Essential (primary) hypertension: Secondary | ICD-10-CM | POA: Insufficient documentation

## 2017-01-02 DIAGNOSIS — F329 Major depressive disorder, single episode, unspecified: Secondary | ICD-10-CM | POA: Diagnosis not present

## 2017-01-02 DIAGNOSIS — M791 Myalgia: Secondary | ICD-10-CM | POA: Insufficient documentation

## 2017-01-02 DIAGNOSIS — M7918 Myalgia, other site: Secondary | ICD-10-CM | POA: Insufficient documentation

## 2017-01-02 DIAGNOSIS — K219 Gastro-esophageal reflux disease without esophagitis: Secondary | ICD-10-CM | POA: Diagnosis not present

## 2017-01-02 NOTE — Assessment & Plan Note (Signed)
Improved.  I commended him on his weight loss as well.  Continue current BP meds.

## 2017-01-02 NOTE — Progress Notes (Signed)
Subjective:    Patient ID: Charles Nash., male    DOB: April 18, 1965, 51 y.o.   MRN: [id number]  HPI Laparoscopic sleeve 10/29/2016, at [facility].  Thus far has lost 80lb Had been on liquid (liver shrinkage diet)  Off many meds, off DM meds, CBG 90-92  ED visit for Right sided neck and upper back  pain worsening when he is looking down Right trap feels tight no radiating pain to hands  He states he had some type of neck injection or upper back injection performed by Dr. Wilford Grist  Left sided low back pain improved since weight loss, sometimes feels it when turning in bed Pain Inventory Average Pain 3 Pain Right Now 2 My pain is sharp  In the last 24 hours, has pain interfered with the following? General activity 2 Relation with others 2 Enjoyment of life 2 What TIME of day is your pain at its worst? daytime Sleep (in general) Good  Pain is worse with: inactivity and standing Pain improves with: therapy/exercise and pacing activities Relief from Meds: .  Mobility walk without assistance ability to climb steps?  yes do you drive?  yes  Function Do you have any goals in this area?  no  Neuro/Psych No problems in this area  Prior Studies Any changes since last visit?  no  Physicians involved in your care Any changes since last visit?  no   Family History  Problem Relation Age of Onset  . Diabetes Mother   . Hypertension Mother   . Arthritis Mother   . Cancer Mother        uterine and breast cancer, sarcoma of the brain  . Hyperlipidemia Father   . Arthritis Father   . Cancer Father        thyroid, prostate cancer  . Other Father        mass in stomach  . Hyperlipidemia Maternal Grandmother   . Hypertension Maternal Grandmother   . Hyperlipidemia Maternal Grandfather   . Hypertension Maternal Grandfather   . Hyperlipidemia Paternal Grandmother   . Hypertension Paternal Grandmother   . Hyperlipidemia Paternal Grandfather   . Hypertension Paternal  Grandfather   . Heart attack Paternal Grandfather   . Sudden death Neg Hx   . Colon cancer Neg Hx   . Esophageal cancer Neg Hx   . Stomach cancer Neg Hx   . Rectal cancer Neg Hx    Social History   Social History  . Marital status: Divorced    Spouse name: N/A  . Number of children: 2  . Years of education: N/A   Occupational History  . [employer]   Social History Main Topics  . Smoking status: Never Smoker  . Smokeless tobacco: Never Used  . Alcohol use No     Comment: occasional wine  . Drug use: No  . Sexual activity: No   Other Topics Concern  . Not on file   Social History Narrative   Regular exercise:  No   Caffeine use:  2--32oz cups daily         Past Surgical History:  Procedure Laterality Date  . ANKLE SURGERY    . DOPPLER ECHOCARDIOGRAPHY  2010  . FOOT SURGERY    . KNEE SURGERY  08/04/08   Right knee-- medial meniscus repair, attenuation anterior cruciate [name])   . NM MYOVIEW LTD  2010  . PLANTAR FASCIA RELEASE    . SHOULDER SURGERY    .  SLEEVE GASTROPLASTY  10/29/2016  . VARICOCELE EXCISION     Past Medical History:  Diagnosis Date  . ADHD (attention deficit hyperactivity disorder)   . Allergy   . Arthritis   . Bilateral varicoceles   . Depression   . Fatty liver 08/06/2011  . Genital herpes 10/25/2014  . GERD (gastroesophageal reflux disease)   . Hyperlipidemia   . Hypertension   . OSA (obstructive sleep apnea)   . Personal history of colonic polyps   . Plantar fasciitis   . PTSD (post-traumatic stress disorder)   . Sleep apnea    There were no vitals taken for this visit.  Opioid Risk Score:   Fall Risk Score:  `1  Depression screen PHQ 2/9  Depression screen [facility] 2/9 07/23/2016 01/01/2016 07/23/2015 06/25/2015 06/04/2015 05/15/2015 04/26/2015  Decreased Interest 2 0 0 1 1 1  0  Down, Depressed, Hopeless 0 0 0 1 1 1  0  PHQ - 2 Score 2 0 0 2 2 2  0  Altered sleeping 3 - - - - 3 -    Tired, decreased energy 3 - - - - 3 -  Change in appetite 3 - - - - 3 -  Feeling bad or failure about yourself  0 - - - - 0 -  Trouble concentrating 3 - - - - 3 -  Moving slowly or fidgety/restless 0 - - - - 0 -  Suicidal thoughts 0 - - - - 0 -  PHQ-9 Score 14 - - - - 14 -  Difficult doing work/chores - - - - - Extremely dIfficult -  Some recent data might be hidden     Review of Systems  Constitutional: Negative.   HENT: Negative.   Eyes: Negative.   Respiratory: Negative.   Cardiovascular: Negative.   Gastrointestinal: Negative.   Endocrine: Negative.   Genitourinary: Negative.   Musculoskeletal: Positive for back pain.  Skin: Negative.   Allergic/Immunologic: Negative.   Neurological: Negative.   Hematological: Negative.   Psychiatric/Behavioral: Negative.        Objective:   Physical Exam  Constitutional: He is oriented to person, place, and time. He appears well-developed and well-nourished. No distress.  HENT:  Head: Normocephalic and atraumatic.  Eyes: Pupils are equal, round, and reactive to light. Conjunctivae and EOM are normal.  Neck: Normal range of motion.  No pain to palpation over the cervical spine area. He has normal range of motion Mild pain with lower flexion  Musculoskeletal:       Right shoulder: He exhibits no tenderness and no spasm.       Cervical back: He exhibits normal range of motion, no tenderness, no deformity and no spasm.       Thoracic back: He exhibits decreased range of motion. He exhibits no tenderness, no deformity and no spasm.       Lumbar back: He exhibits normal range of motion, no tenderness, no deformity and no spasm.  Lumbar spine with normal forward flexion Extension Limited to 75%  Lymphadenopathy:    He has no cervical adenopathy.  Neurological: He is alert and oriented to person, place, and time. He displays no atrophy and no tremor. He exhibits normal muscle tone. Gait normal.  Motor strength is 5/5 bilateral  deltoids, biceps, triceps, grip, sensation normal bilateral C5, C6, C7, C8 dermatomal distribution.   Skin: He is not diaphoretic.  Nursing note and vitals reviewed.  Patient points to the upper lumbar area as the spot where he sometimes exhibits  pain when he turns in bed. He cannot identify the area because it is not sore at the current time       Assessment & Plan:   1. Chronic low back pain without sciatica, status post right radiofrequency L3, L4, L5 in November 2017 and left radiofrequency L3, L4, L5 December 2017. At this point it seems like it is still effective. We'll reevaluate in 3 months, which will be approximately 12 months post. Given his excellent success at weight loss in combination with the gastric sleeve procedure, he may experience less back pain.  2. Mid back and neck pain, this is chronic. His exam is negative. There is no neurologic impairment. Instructed to use counter irritant cream as well as heat 20-30 minutes several times a day as needed. He can call back for an injection should he need it. I suspect he may have some facet degeneration at the L1-L2 area on the right side

## 2017-01-02 NOTE — Patient Instructions (Addendum)
Try Superman exercises,try holding a push up position for a minute a day  Please call if you had increasing neck, upper back or lower back pain.  Use heating pad 20-30 minutes as needed for back pain. May use cream such as BenGay or icy hot

## 2017-01-02 NOTE — Assessment & Plan Note (Addendum)
Related to his time in the service.  He is on partial disability for this.  Letter provided.  Clinically stable.  He is followed by psychiatry at the [facility] for this.

## 2017-01-06 ENCOUNTER — Ambulatory Visit (HOSPITAL_COMMUNITY)
Admission: RE | Admit: 2017-01-06 | Discharge: 2017-01-06 | Disposition: A | Discharge status: Home / Self Care | Attending: Psychiatry | Admitting: Psychiatry

## 2017-01-06 DIAGNOSIS — R450 Nervousness: Secondary | ICD-10-CM | POA: Diagnosis not present

## 2017-01-06 DIAGNOSIS — F329 Major depressive disorder, single episode, unspecified: Secondary | ICD-10-CM | POA: Insufficient documentation
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
## 2017-01-07 NOTE — H&P (Signed)
Behavioral Health Medical Screening Exam  Charles Nash. is an 51 y.o. male presenting to [facility] after speaking with someone on the [place] crises line due to feeling distraught and emotionally volatile due to relationship discord between two of his lovers. He is denying any HI, SA or HI. He is sad and remorseful for things that he has done. He has a hx of MDD and has been on Wellbutrin 300 XL daily. 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 He appears well-developed and well-nourished. No distress.  HENT:  Head: Normocephalic.  Eyes: Pupils are equal, round, and reactive to light.  Respiratory: Effort normal and breath sounds normal. No respiratory distress.  Neurological: He is alert and oriented to person, place, and time. No cranial nerve deficit.  Skin: Skin is warm and dry. He is not diaphoretic.  Psychiatric: His speech is normal. Thought content normal. His mood appears anxious. He is aggressive. Cognition and memory are normal. He expresses impulsivity. He exhibits a depressed mood.    Review of Systems  Psychiatric/Behavioral: Positive for depression. Negative for hallucinations, substance abuse and suicidal ideas. The patient is nervous/anxious.   All other systems reviewed and are negative.   There were no vitals taken for this visit.There is no height or weight on file to calculate BMI.  General Appearance: Casual  Eye Contact:  Good  Speech:  Clear and Coherent  Volume:  Normal  Mood:  Depressed  Affect:  Congruent  Thought Process:  Goal Directed  Orientation:  Full (Time, Place, and Person)  Thought Content:  Negative  Suicidal Thoughts:  No  Homicidal Thoughts:  No  Memory:  Immediate;   Good  Judgement:  Fair  Insight:  Fair  Psychomotor Activity:  Negative  Concentration:  Concentration: Good  Recall:  Good  Fund of Knowledge:Good  Language: Good  Akathisia:  Negative  Handed:  Right  AIMS (if indicated):     Assets:  Desire for Improvement  Sleep:       Musculoskeletal: Strength & Muscle Tone: within normal limits Gait & Station: normal Patient leans: N/A  There were no vitals taken for this visit.  Recommendations:  Based on my evaluation the patient does not appear to have an emergency medical condition.  Laverle Hobby, PA-C 01/07/2017, 12:00 AM

## 2017-01-19 ENCOUNTER — Telehealth: Payer: Self-pay | Admitting: *Deleted

## 2017-01-19 NOTE — Telephone Encounter (Signed)
Received request for Medical Records from [facility]; forwarded to [place] for email/scan/SLS 10/08

## 2017-02-04 ENCOUNTER — Encounter (HOSPITAL_BASED_OUTPATIENT_CLINIC_OR_DEPARTMENT_OTHER): Payer: Self-pay

## 2017-02-04 ENCOUNTER — Emergency Department (HOSPITAL_BASED_OUTPATIENT_CLINIC_OR_DEPARTMENT_OTHER)
Admission: EM | Admit: 2017-02-04 | Discharge: 2017-02-04 | Disposition: A | Discharge status: Home / Self Care | Attending: Emergency Medicine | Admitting: Emergency Medicine

## 2017-02-04 DIAGNOSIS — E1142 Type 2 diabetes mellitus with diabetic polyneuropathy: Secondary | ICD-10-CM | POA: Diagnosis not present

## 2017-02-04 DIAGNOSIS — I10 Essential (primary) hypertension: Secondary | ICD-10-CM | POA: Diagnosis not present

## 2017-02-04 DIAGNOSIS — Z79899 Other long term (current) drug therapy: Secondary | ICD-10-CM | POA: Diagnosis not present

## 2017-02-04 DIAGNOSIS — K644 Residual hemorrhoidal skin tags: Secondary | ICD-10-CM | POA: Diagnosis not present

## 2017-02-04 DIAGNOSIS — K649 Unspecified hemorrhoids: Secondary | ICD-10-CM | POA: Diagnosis present

## 2017-02-04 MED ORDER — HYDROCORTISONE 2.5 % RE CREA: TOPICAL_CREAM | RECTAL | 0 refills | Status: DC

## 2017-02-04 MED ORDER — NITROGLYCERIN 0.4 % RE OINT: TOPICAL_OINTMENT | RECTAL | 0 refills | Status: DC

## 2017-02-04 MED FILL — PROCTO-MED HC 2.5% CREAM: 2.5 | 30 days supply | Qty: 30 | Fill #0

## 2017-02-04 MED FILL — RECTIV 0.4% OINTMENT: 0.4 | 30 days supply | Qty: 30 | Fill #0

## 2017-02-04 NOTE — ED Provider Notes (Signed)
[facility] EMERGENCY DEPARTMENT Provider Note   CSN: [id number] Arrival date & time: 02/04/17  1140     History   Chief Complaint Chief Complaint  Patient presents with  . Hemorrhoids    HPI Charles Nash. is a 51 y.o. male.  The history is provided by the patient and medical records. No language interpreter was used.   Charles Nash. is a 51 y.o. male who presents to the Emergency Department complaining of intermittent rectal pain which he believes is 2/2 hemorrhoid. He states that he will have some pain after tough bowel movements which typically improves. Other the last 3 days, he has been experiencing constant pain which did not improve with warm wash cloth soaks or preparation H. He has started taking stool softener and Miralax daily which have improved constipation, however hemorrhoidal pain has not improved.    Past Medical History:  Diagnosis Date  . ADHD (attention deficit hyperactivity disorder)   . Allergy   . Arthritis   . Bilateral varicoceles   . Depression   . Fatty liver 08/06/2011  . Genital herpes 10/25/2014  . GERD (gastroesophageal reflux disease)   . Hyperlipidemia   . Hypertension   . OSA (obstructive sleep apnea)   . Personal history of colonic polyps   . Plantar fasciitis   . PTSD (post-traumatic stress disorder)   . Sleep apnea     Patient Active Problem List   Diagnosis Date Noted  . Myofascial pain dysfunction syndrome 01/02/2017  . Insect bites 11/10/2016  . Diabetic polyneuropathy associated with type 2 diabetes mellitus ([place]) 08/25/2016  . Achilles tendinitis, left leg 08/25/2016  . Achilles tendon contracture, left 08/25/2016  . Xerosis of skin 04/22/2016  . Chronic urticaria 03/05/2016  . Sacroiliac joint dysfunction of both sides 01/01/2016  . Anxiety state 08/20/2015  . 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 Patellofemoral disorder 05/15/2015  . Current use of beta  blocker 05/15/2015  . Genital herpes 10/25/2014  . Depression 10/05/2014  . Osteoarthritis of left knee 08/15/2014  . 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 Sciatica 04/22/2014  . Osteoarthritis of right knee 02/06/2014  . Allergy to mold 12/13/2013  . Weight gain, abnormal 11/08/2013  . PTSD (post-traumatic stress disorder) 07/05/2013  . Multinodular goiter 01/19/2013  . GERD (gastroesophageal reflux disease) 08/09/2012  . Dermographia 12/29/2011  . Neck pain 11/12/2011  . Fatty liver 08/06/2011  . Cervical disc disease 07/28/2011  . Preventative health care 07/28/2011  . Erectile dysfunction 05/23/2011  . Elevated liver function tests 05/20/2011  . Type II diabetes mellitus, well controlled ([place]) 05/19/2011  . Essential hypertension 05/19/2011  . Hyperlipidemia 05/19/2011  . Morbid obesity ([place]) 05/19/2011  . Other allergic rhinitis 05/19/2011  . Obstructive sleep apnea treated with continuous positive airway pressure (CPAP) 05/19/2011    Past Surgical History:  Procedure Laterality Date  . ANKLE SURGERY    . DOPPLER ECHOCARDIOGRAPHY  2010  . FOOT SURGERY    . KNEE SURGERY  08/04/08   Right knee-- medial meniscus repair, attenuation anterior cruciate [name] MD [facility])   . NM MYOVIEW LTD  2010  . PLANTAR FASCIA RELEASE    . SHOULDER SURGERY    . SLEEVE GASTROPLASTY  10/29/2016  . VARICOCELE EXCISION         Home Medications    Prior to Admission medications   Medication Sig Start Date End Date Taking? Authorizing Provider  atorvastatin (LIPITOR) 10 MG tablet TAKE 1  TABLET DAILY 08/18/16   Debbrah Alar, NP  Blood Glucose Monitoring Suppl (ACCU-CHEK GUIDE) w/Device KIT 1 each by Does not apply route daily. 11/26/16   Debbrah Alar, NP  buPROPion (WELLBUTRIN XL) 300 MG 24 hr tablet Take 1 tablet (300 mg total) by mouth daily. 12/26/16   Debbrah Alar, NP  CALCIUM-MAGNESIUM-VITAMIN D PO Take 1 tablet by mouth 3 (three) times daily.     [provider]  cetirizine (ZYRTEC) 10 MG tablet Take by mouth. 06/26/15   [provider]  Cholecalciferol (VITAMIN D3) 5000 UNIT/ML LIQD Take 5,000 Units by mouth daily.    [provider]  Cobalamine Combinations (L-97 + FOLIC ACID PO) Take by mouth. 1/2 dropper ful once a day.    [provider]  fexofenadine (ALLEGRA) 180 MG tablet Take 1 tablet (180 mg total) by mouth daily as needed for allergies or rhinitis. 11/10/16   Bobbitt, Sedalia Muta, MD  fluticasone (FLONASE) 50 MCG/ACT nasal spray Place 2 sprays into both nostrils daily. 09/09/16   Debbrah Alar, NP  glucose blood (ACCU-CHEK GUIDE) test strip Use as instructed 11/26/16   Debbrah Alar, NP  hydrALAZINE (APRESOLINE) 50 MG tablet Take 1 tablet (50 mg total) by mouth 3 (three) times daily. 12/10/16   Debbrah Alar, NP  hydrocortisone (ANUSOL-HC) 2.5 % rectal cream Apply rectally 2 times daily 02/04/17   [place] Sarinana, Ozella Almond, PA-C  KLOR-CON M20 20 MEQ tablet TAKE 1 TABLET DAILY 08/07/16   Debbrah Alar, NP  Lancets (FREESTYLE) lancets USE TO CHECK BLOOD SUGAR ONCE DAILY 04/02/15   Debbrah Alar, NP  metoprolol succinate (TOPROL XL) 50 MG 24 hr tablet Take 2 tablets (100 mg total) by mouth daily. PLEASE CONTACT OFFICE FOR ADDITIONAL REFILLS 2ND ATTEMPT 12/18/15   Hilty, Nadean Corwin, MD  montelukast (SINGULAIR) 10 MG tablet Take 1 tablet (10 mg total) by mouth at bedtime. 09/24/16   Debbrah Alar, NP  Multiple Vitamin (MULTI-VITAMINS) TABS Take 1 tablet by mouth daily.    [provider]  Nitroglycerin 0.4 % OINT Apply to hemorrhoid twice daily. 02/04/17   Lashunda Greis, Ozella Almond, PA-C  omeprazole (PRILOSEC) 20 MG capsule TAKE 2 CAPSULES EVERY MORNING 12/08/16   Debbrah Alar, NP  polyethylene glycol (MIRALAX / GLYCOLAX) packet  10/30/16   [provider]  PROAIR HFA 108 (90 Base) MCG/ACT inhaler Inhale 2 puffs into the lungs 4 (four) times daily as needed.  09/29/16   [provider]  Pyridoxine HCl (B-6 PO) Take 1 tablet by mouth daily.    [provider]  triamcinolone ointment (KENALOG) 0.1 % Apply to red itchy areas only below the face 04/22/16   Charlies Silvers, MD  VIAGRA 100 MG tablet TAKE ONE-HALF (1/2) TO 1 TABLET DAILY AS NEEDED 04/25/16   Debbrah Alar, NP  vitamin C (ASCORBIC ACID) 500 MG tablet Take 500 mg by mouth daily.    [provider]    Family History Family History  Problem Relation Age of Onset  . Diabetes Mother   . Hypertension Mother   . Arthritis Mother   . Cancer Mother        uterine and breast cancer, sarcoma of the brain  . Hyperlipidemia Father   . Arthritis Father   . Cancer Father        thyroid, prostate cancer  . Other Father        mass in stomach  . Hyperlipidemia Maternal Grandmother   . Hypertension Maternal Grandmother   . Hyperlipidemia Maternal  Grandfather   . Hypertension Maternal Grandfather   . Hyperlipidemia Paternal Grandmother   . Hypertension Paternal Grandmother   . Hyperlipidemia Paternal Grandfather   . Hypertension Paternal Grandfather   . Heart attack Paternal Grandfather   . Sudden death Neg Hx   . Colon cancer Neg Hx   . Esophageal cancer Neg Hx   . Stomach cancer Neg Hx   . Rectal cancer Neg Hx     Social History Social History  Substance Use Topics  . Smoking status: Never Smoker  . Smokeless tobacco: Never Used  . Alcohol use No     Allergies   Oxycodone-acetaminophen and Zoloft [sertraline hcl]   Review of Systems Review of Systems  Constitutional: Negative for chills and fever.  Gastrointestinal: Positive for constipation and rectal pain. Negative for abdominal pain, anal bleeding, blood in stool, nausea and vomiting.     Physical Exam Updated Vital Signs BP (!) 155/91 (BP Location: Left Arm)   Pulse 72   Temp 98.5 F (36.9 C) (Oral)   Resp 20   Ht _0  (1.854 m)   Wt 124 kg (273 lb 5.9 oz)   SpO2 99%   BMI 36.07  kg/m   Physical Exam  Constitutional: He appears well-developed and well-nourished. No distress.  HENT:  Head: Normocephalic and atraumatic.  Cardiovascular: Normal rate, regular rhythm and normal heart sounds.   No murmur heard. Pulmonary/Chest: Effort normal and breath sounds normal. No respiratory distress. He has no wheezes. He has no rales.  Abdominal: Soft. He exhibits no distension. There is no tenderness.  Genitourinary:     Musculoskeletal: Normal range of motion.  Neurological: He is alert.  Skin: Skin is warm and dry.  Nursing note and vitals reviewed.    ED Treatments / Results  Labs (all labs ordered are listed, but only abnormal results are displayed) Labs Reviewed - No data to display  EKG  EKG Interpretation None       Radiology No results found.  Procedures Procedures (including critical care time)  Medications Ordered in ED Medications - No data to display   Initial Impression / Assessment and Plan / ED Course  I have reviewed the triage vital signs and the nursing notes.  Pertinent labs & imaging results that were available during my care of the patient were reviewed by me and considered in my medical decision making (see chart for details).    Charles Nash. is a 51 y.o. male who presents to ED for rectal pain. Exam c/w external hemorrhoid. No sign of perirectal abscess. Symptomatic home care instructions including medication, hydration and bowel health discussed. PCP follow up if no improvement. Return precautions discussed and all questions answered.   Final Clinical Impressions(s) / ED Diagnoses   Final diagnoses:  External hemorrhoid    New Prescriptions New Prescriptions   HYDROCORTISONE (ANUSOL-HC) 2.5 % RECTAL CREAM    Apply rectally 2 times daily   NITROGLYCERIN 0.4 % OINT    Apply to hemorrhoid twice daily.     Virgilio Broadhead, Ozella Almond, PA-C 02/04/17 1241    Fatima Blank, MD 02/05/17 [phone]

## 2017-02-04 NOTE — Discharge Instructions (Signed)
It was my pleasure taking care of you today!   Increase hydration and follow a high fiber diet. Use a stool softener such as Colace to help relieve pain with bowel movements. Sitz baths (ask pharmacist if you need help finding this) and Epsom salt baths will also help improve symptoms. Apply topical medications to hemorrhoid twice daily. If symptoms persist longer than 1-2 weeks, please follow up with your primary care provider. Please return to ER for new or worsening symptoms, any additional concerns.

## 2017-02-04 NOTE — ED Triage Notes (Signed)
C/o hx of hemorrhoids-worse x 3 days-NAD-steady gait-playing game on phone in [place]

## 2017-02-06 ENCOUNTER — Encounter: Payer: Self-pay | Admitting: Family

## 2017-02-06 DIAGNOSIS — K649 Unspecified hemorrhoids: Secondary | ICD-10-CM

## 2017-02-09 ENCOUNTER — Encounter: Payer: Self-pay | Admitting: Family

## 2017-02-09 MED ORDER — METOPROLOL SUCCINATE ER 50 MG PO TB24: 50.0000 mg | ORAL_TABLET | Freq: Every day | ORAL | 2 refills | Status: DC

## 2017-02-09 MED FILL — METOPROLOL SUCC ER 50 MG TA: 50 | 30 days supply | Qty: 30 | Fill #0

## 2017-02-09 NOTE — Telephone Encounter (Signed)
Called pt as I was not clear on his dose.  He reports that he is taking 50 mg of toprol xl daily.  Refilled for him Lenna Sciara- can you confirm this dose? It looked like he might be supposed to be taking 75 or 100 mg  Meds ordered this encounter  Medications  . metoprolol succinate (TOPROL XL) 50 MG 24 hr tablet    Sig: Take 1 tablet (50 mg total) by mouth daily. PLEASE CONTACT OFFICE FOR ADDITIONAL REFILLS 2ND ATTEMPT    Dispense:  30 tablet    Refill:  2

## 2017-02-09 NOTE — Telephone Encounter (Signed)
Please advise in PCP absence-- Metoprolol last filled by PCP in 2016 and most recent refill in [facility] was 12/2015 with cardiologist. Please advise pt's request?

## 2017-02-17 ENCOUNTER — Ambulatory Visit: Payer: Self-pay | Admitting: Nurse Practitioner

## 2017-02-23 ENCOUNTER — Encounter: Payer: Self-pay | Admitting: Family

## 2017-03-02 ENCOUNTER — Other Ambulatory Visit: Payer: Self-pay | Admitting: Family

## 2017-03-02 ENCOUNTER — Encounter: Payer: Self-pay | Admitting: Family

## 2017-03-02 MED ORDER — ATORVASTATIN CALCIUM 10 MG PO TABS: 10.0000 mg | ORAL_TABLET | Freq: Every day | ORAL | 1 refills | Status: DC

## 2017-03-09 ENCOUNTER — Telehealth: Payer: Self-pay | Admitting: Family

## 2017-03-09 MED ORDER — ATORVASTATIN CALCIUM 10 MG PO TABS: 10.0000 mg | ORAL_TABLET | Freq: Every day | ORAL | 0 refills | Status: DC

## 2017-03-09 NOTE — Telephone Encounter (Signed)
Pt is requesting lipitor to tide him over because his prescription from mail order has not arrived; Romie Minus in Pharmacy at [facility] states that a new prescription is required for the pt to get additional med

## 2017-03-09 NOTE — Telephone Encounter (Unsigned)
Copied from [place] [id number]. Topic: Quick Communication - Rx Refill/Question >> Mar 09, 2017 12:16 PM Hewitt Shorts wrote: Has the patient contacted their pharmacy?yes   (Agent: If no, request that the patient contact the pharmacy for the refill.)pt is needing the approved lipitor medication that was sent to mail order to be sent downstairs to the pharmacy at [place] (with phone number or street name):    Agent: Please be advised that RX refills may take up to 48 hours. We ask that you follow-up with your pharmacy.

## 2017-03-09 NOTE — Telephone Encounter (Signed)
Received

## 2017-03-11 ENCOUNTER — Other Ambulatory Visit: Payer: Self-pay | Admitting: Family

## 2017-03-12 ENCOUNTER — Ambulatory Visit: Admitting: Physical Medicine & Rehabilitation

## 2017-03-16 ENCOUNTER — Encounter: Payer: Self-pay | Admitting: Family

## 2017-03-16 MED ORDER — METOPROLOL SUCCINATE ER 50 MG PO TB24: 50.0000 mg | ORAL_TABLET | Freq: Every day | ORAL | 1 refills | Status: DC

## 2017-03-16 MED FILL — METOPROLOL SUCC ER 50 MG TA: 50 | 30 days supply | Qty: 30 | Fill #1

## 2017-03-18 ENCOUNTER — Ambulatory Visit: Payer: Self-pay | Admitting: *Deleted

## 2017-03-18 ENCOUNTER — Ambulatory Visit (INDEPENDENT_AMBULATORY_CARE_PROVIDER_SITE_OTHER): Admitting: Family

## 2017-03-18 ENCOUNTER — Encounter: Payer: Self-pay | Admitting: Family

## 2017-03-18 VITALS — BP 144/87 | HR 74 | Temp 98.3°F | Resp 16 | Ht 73.0 in | Wt 272.6 lb

## 2017-03-18 DIAGNOSIS — I10 Essential (primary) hypertension: Secondary | ICD-10-CM | POA: Diagnosis not present

## 2017-03-18 DIAGNOSIS — E118 Type 2 diabetes mellitus with unspecified complications: Secondary | ICD-10-CM | POA: Diagnosis not present

## 2017-03-18 LAB — BASIC METABOLIC PANEL
BUN: 7 mg/dL (ref 6–23)
CHLORIDE: 100 meq/L (ref 96–112)
CO2: 31 meq/L (ref 19–32)
CREATININE: 0.92 mg/dL (ref 0.40–1.50)
Calcium: 9.4 mg/dL (ref 8.4–10.5)
GFR: 111.68 mL/min (ref >60.00)
GLUCOSE: 90 mg/dL (ref 70–99)
Potassium: 4 mEq/L (ref 3.5–5.1)
SODIUM: 139 meq/L (ref 135–145)

## 2017-03-18 LAB — HEMOGLOBIN A1C: HEMOGLOBIN A1C: 5.7 % (ref 4.6–6.5)

## 2017-03-18 MED ORDER — LISINOPRIL 10 MG PO TABS: 10.0000 mg | ORAL_TABLET | Freq: Every day | ORAL | 3 refills | Status: DC

## 2017-03-18 MED FILL — LISINOPRIL 10 MG TABLET: 10 | 30 days supply | Qty: 30 | Fill #0

## 2017-03-18 NOTE — Patient Instructions (Signed)
Add lisinopril 10mg once daily

## 2017-03-18 NOTE — Telephone Encounter (Signed)
See 03/18/17 office note.

## 2017-03-18 NOTE — Telephone Encounter (Signed)
Pt states that he went to appointment last week and was told that his BP was high; he ran out of metoprolol on 03/13/17 and picked it up 03/17/17 (missed a total of 2 doses); pt states that he was taking hydralazine as ordered and his blood pressure this morning was  171/108 and 169/104 left arm automatic cuff; pt states that his medications have been changed his BP is trending up; pt is requesting an appointment with Dr Inda Castle; pt offered and accepted a 0740 appointment this am; pt verbalizes understanding; will route to [facility] pool to notify them of this appointment.  Reason for Disposition . Systolic BP  >= 161 OR Diastolic >= 096  Answer Assessment - Initial Assessment Questions 1. BLOOD PRESSURE: "What is the blood pressure?" "Did you take at least two measurements 5 minutes apart?"     171/108 and 169/104 2. ONSET: "When did you take your blood pressure?"     03/18/17 at 0600 3. HOW: "How did you obtain the blood pressure?" (e.g., visiting nurse, automatic home BP monitor)     Automatic home BP cuff 4. HISTORY: "Do you have a history of high blood pressure?"     yes 5. MEDICATIONS: "Are you taking any medications for blood pressure?" "Have you missed any doses recently?"     Yes; missed 2 doses 6. OTHER SYMPTOMS: "Do you have any symptoms?" (e.g., headache, chest pain, blurred vision, difficulty breathing, weakness)     Light headache rated 1 out of 10 7. PREGNANCY: "Is there any chance you are pregnant?" "When was your last menstrual period?"     n/a  Protocols used: HIGH BLOOD PRESSURE-A-AH

## 2017-03-18 NOTE — Progress Notes (Signed)
Subjective:    Patient ID: Charles Nash., male    DOB: 04-Jun-1965, 51 y.o.   MRN: [id number]  HPI  [name] is a 51 yr old male who presents today for follow up of his hypertension.  1) HTN- BP meds include hydralazine, toprol xl, continues to work on weight loss.  BP Readings from Last 3 Encounters:  03/18/17 (!) 144/87  02/04/17 (!) 155/91  01/02/17 (!) 143/96   2) DM2-  Lab Results  Component Value Date   HGBA1C 6.5 11/26/2016   HGBA1C 6.8 (H) 04/03/2016   HGBA1C 6.6 (H) 01/01/2016   Lab Results  Component Value Date   MICROALBUR 2.3 (H) 01/26/2015   LDLCALC 48 11/26/2016   CREATININE 0.96 11/26/2016   Wt Readings from Last 3 Encounters:  03/18/17 272 lb 9.6 oz (123.7 kg)  02/04/17 273 lb 5.9 oz (124 kg)  12/30/16 286 lb (129.7 kg)    Review of Systems See HPI  Past Medical History:  Diagnosis Date  . ADHD (attention deficit hyperactivity disorder)   . Allergy   . Arthritis   . Bilateral varicoceles   . Depression   . Fatty liver 08/06/2011  . Genital herpes 10/25/2014  . GERD (gastroesophageal reflux disease)   . Hyperlipidemia   . Hypertension   . OSA (obstructive sleep apnea)   . Personal history of colonic polyps   . Plantar fasciitis   . PTSD (post-traumatic stress disorder)   . Sleep apnea      Social History   Socioeconomic History  . Marital status: Divorced    Spouse name: Not on file  . Number of children: 2  . Years of education: Not on file  . Highest education level: Not on file  Social Needs  . Financial resource strain: Not on file  . Food insecurity - worry: Not on file  . Food insecurity - inability: Not on file  . Transportation needs - medical: Not on file  . Transportation needs - non-medical: Not on file  Occupational History  . Occupation: Astronomer: [employer]  Tobacco Use  . Smoking status: Never Smoker  . Smokeless tobacco: Never Used  Substance and Sexual Activity  . Alcohol use:  No    Alcohol/week: 0.0 oz  . Drug use: No  . Sexual activity: Not on file  Other Topics Concern  . Not on file  Social History Narrative   Regular exercise:  No   Caffeine use:  2--32oz cups daily       Past Surgical History:  Procedure Laterality Date  . ANKLE SURGERY    . DOPPLER ECHOCARDIOGRAPHY  2010  . FOOT SURGERY    . KNEE SURGERY  08/04/08   Right knee-- medial meniscus repair, attenuation anterior cruciate [name] MD [facility])   . NM MYOVIEW LTD  2010  . PLANTAR FASCIA RELEASE    . SHOULDER SURGERY    . SLEEVE GASTROPLASTY  10/29/2016  . VARICOCELE EXCISION      Family History  Problem Relation Age of Onset  . Diabetes Mother   . Hypertension Mother   . Arthritis Mother   . Cancer Mother        uterine and breast cancer, sarcoma of the brain  . Hyperlipidemia Father   . Arthritis Father   . Cancer Father        thyroid, prostate cancer  . Other Father        mass in  stomach  . Hyperlipidemia Maternal Grandmother   . Hypertension Maternal Grandmother   . Hyperlipidemia Maternal Grandfather   . Hypertension Maternal Grandfather   . Hyperlipidemia Paternal Grandmother   . Hypertension Paternal Grandmother   . Hyperlipidemia Paternal Grandfather   . Hypertension Paternal Grandfather   . Heart attack Paternal Grandfather   . Sudden death Neg Hx   . Colon cancer Neg Hx   . Esophageal cancer Neg Hx   . Stomach cancer Neg Hx   . Rectal cancer Neg Hx     Allergies  Allergen Reactions  . Oxycodone-Acetaminophen Hives and Itching  . Zoloft [Sertraline Hcl]     ?weight gain. "made me feel weird"    Current Outpatient Medications on File Prior to Visit  Medication Sig Dispense Refill  . atorvastatin (LIPITOR) 10 MG tablet Take 1 tablet (10 mg total) by mouth daily. 14 tablet 0  . Blood Glucose Monitoring Suppl (ACCU-CHEK GUIDE) w/Device KIT 1 each by Does not apply route daily. 1 kit 0  . buPROPion (WELLBUTRIN XL) 300 MG 24 hr tablet  Take 1 tablet (300 mg total) by mouth daily. (Patient taking differently: Take 150 mg by mouth daily. )    . CALCIUM-MAGNESIUM-VITAMIN D PO Take 1 tablet by mouth 3 (three) times daily.    . Cholecalciferol (VITAMIN D3) 5000 UNIT/ML LIQD Take 5,000 Units by mouth daily.    . Cobalamine Combinations (G-64 + FOLIC ACID PO) Take by mouth. 1/2 dropper ful once a day.    . fexofenadine (ALLEGRA) 180 MG tablet Take 1 tablet (180 mg total) by mouth daily as needed for allergies or rhinitis. 30 tablet 2  . fluticasone (FLONASE) 50 MCG/ACT nasal spray Place 2 sprays into both nostrils daily. 16 g 6  . hydrALAZINE (APRESOLINE) 50 MG tablet Take 1 tablet (50 mg total) by mouth 3 (three) times daily. 180 tablet 1  . hydrocortisone (ANUSOL-HC) 2.5 % rectal cream Apply rectally 2 times daily 28 g 0  . KLOR-CON M20 20 MEQ tablet TAKE 1 TABLET DAILY 90 tablet 1  . Lancets (FREESTYLE) lancets USE TO CHECK BLOOD SUGAR ONCE DAILY 100 each 1  . metoprolol succinate (TOPROL XL) 50 MG 24 hr tablet Take 1 tablet (50 mg total) by mouth daily. PLEASE CONTACT OFFICE FOR ADDITIONAL REFILLS 2ND ATTEMPT 90 tablet 1  . montelukast (SINGULAIR) 10 MG tablet Take 1 tablet (10 mg total) by mouth at bedtime. (Patient taking differently: Take 10 mg by mouth at bedtime as needed. ) 30 tablet 3  . Multiple Vitamin (MULTI-VITAMINS) TABS Take 1 tablet by mouth daily.    Marland Kitchen omeprazole (PRILOSEC) 20 MG capsule TAKE 2 CAPSULES EVERY MORNING 180 capsule 1  . polyethylene glycol (MIRALAX / GLYCOLAX) packet     . PROAIR HFA 108 (90 Base) MCG/ACT inhaler Inhale 2 puffs into the lungs 4 (four) times daily as needed.    . Pyridoxine HCl (B-6 PO) Take 1 tablet by mouth daily.    . QUEtiapine (SEROQUEL) 25 MG tablet Take 1 tablet by mouth at bedtime.    . sildenafil (VIAGRA) 100 MG tablet TAKE 1/2 TO 1 TABLET BY MOUTH DAILY AS NEEDED 15 tablet 1  . triamcinolone ointment (KENALOG) 0.1 % Apply to red itchy areas only below the face 80 g 3  .  vitamin C (ASCORBIC ACID) 500 MG tablet Take 500 mg by mouth daily.    Marland Kitchen glucose blood (ACCU-CHEK GUIDE) test strip Use as instructed (Patient not taking: Reported on 03/18/2017)  100 each 1  . [DISCONTINUED] potassium chloride (K-DUR) 10 MEQ tablet Take 1 tablet (10 mEq total) by mouth daily. 30 tablet 1   Current Facility-Administered Medications on File Prior to Visit  Medication Dose Route Frequency Provider Last Rate Last Dose  . 0.9 %  sodium chloride infusion  500 mL Intravenous Continuous Ladene Artist, MD        BP (!) 144/87 (BP Location: Left Arm, Cuff Size: Large)   Pulse 74   Temp 98.3 F (36.8 C) (Oral)   Resp 16   Ht '6\' 1"'$  (1.854 m)   Wt 272 lb 9.6 oz (123.7 kg)   SpO2 99%   BMI 35.97 kg/m       Objective:   Physical Exam  Constitutional: He is oriented to person, place, and time. He appears well-developed and well-nourished. No distress.  HENT:  Head: Normocephalic and atraumatic.  Cardiovascular: Normal rate and regular rhythm.  No murmur heard. Pulmonary/Chest: Effort normal and breath sounds normal. No respiratory distress. He has no wheezes. He has no rales.  Musculoskeletal: He exhibits no edema.  Neurological: He is alert and oriented to person, place, and time.  Skin: Skin is warm and dry.  Psychiatric: He has a normal mood and affect. His behavior is normal. Thought content normal.          Assessment & Plan:  HTN-  Uncontrolled. Add ace for bp control and renal protection.  DM2- clinically stable.  Obtain A1C, should be improved due to recent weight loss.

## 2017-03-18 NOTE — Telephone Encounter (Signed)
Concern addressed at [place] today.

## 2017-03-25 ENCOUNTER — Telehealth: Payer: Self-pay | Admitting: Physical Medicine & Rehabilitation

## 2017-03-25 NOTE — Telephone Encounter (Signed)
Just an FYI, patient wanted you to know that he was shoveling snow on 03/24/17, and felt pain on his right side. He stated he will discuss with you at his appointment on 12/13.

## 2017-03-26 ENCOUNTER — Encounter: Payer: Self-pay | Admitting: Physical Medicine & Rehabilitation

## 2017-03-26 ENCOUNTER — Encounter: Attending: Physical Medicine & Rehabilitation

## 2017-03-26 ENCOUNTER — Ambulatory Visit: Admitting: Physical Medicine & Rehabilitation

## 2017-03-26 ENCOUNTER — Encounter

## 2017-03-26 ENCOUNTER — Ambulatory Visit (HOSPITAL_BASED_OUTPATIENT_CLINIC_OR_DEPARTMENT_OTHER): Admitting: Physical Medicine & Rehabilitation

## 2017-03-26 VITALS — BP 149/96 | HR 78

## 2017-03-26 DIAGNOSIS — F329 Major depressive disorder, single episode, unspecified: Secondary | ICD-10-CM | POA: Insufficient documentation

## 2017-03-26 DIAGNOSIS — M47816 Spondylosis without myelopathy or radiculopathy, lumbar region: Secondary | ICD-10-CM | POA: Insufficient documentation

## 2017-03-26 DIAGNOSIS — I10 Essential (primary) hypertension: Secondary | ICD-10-CM | POA: Diagnosis not present

## 2017-03-26 DIAGNOSIS — E785 Hyperlipidemia, unspecified: Secondary | ICD-10-CM | POA: Diagnosis not present

## 2017-03-26 DIAGNOSIS — K219 Gastro-esophageal reflux disease without esophagitis: Secondary | ICD-10-CM | POA: Insufficient documentation

## 2017-03-26 DIAGNOSIS — G4733 Obstructive sleep apnea (adult) (pediatric): Secondary | ICD-10-CM | POA: Diagnosis not present

## 2017-03-26 DIAGNOSIS — M7918 Myalgia, other site: Secondary | ICD-10-CM

## 2017-03-26 DIAGNOSIS — M533 Sacrococcygeal disorders, not elsewhere classified: Secondary | ICD-10-CM

## 2017-03-26 NOTE — Progress Notes (Signed)
Subjective:    Patient ID: Charles Nash., male    DOB: 1966/03/07, 51 y.o.   MRN: [id number]  HPI Lost 115 lb since lap sleeve    CC:  Left sided  Low back pain  Tingling under the shoulder blade laterally, no severe pain radiating toward the ribs. Patient was shoveling snow and felt like he twisted his back has low back pain more on the right side.  No pain radiating down to the legs.  No weakness in the lower limbs no bowel or bladder dysfunction. Has not tried heat ice or any topical creams.  Pain Inventory Average Pain 3 Pain Right Now 2 My pain is sharp  In the last 24 hours, has pain interfered with the following? General activity 2 Relation with others 2 Enjoyment of life 2 What TIME of day is your pain at its worst? daytime Sleep (in general) Good  Pain is worse with: inactivity and standing Pain improves with: therapy/exercise and pacing activities Relief from Meds: .  Mobility walk without assistance ability to climb steps?  yes do you drive?  yes  Function Do you have any goals in this area?  no  Neuro/Psych No problems in this area  Prior Studies Any changes since last visit?  no  Physicians involved in your care Any changes since last visit?  no   Family History  Problem Relation Age of Onset  . Diabetes Mother   . Hypertension Mother   . Arthritis Mother   . Cancer Mother        uterine and breast cancer, sarcoma of the brain  . Hyperlipidemia Father   . Arthritis Father   . Cancer Father        thyroid, prostate cancer  . Other Father        mass in stomach  . Hyperlipidemia Maternal Grandmother   . Hypertension Maternal Grandmother   . Hyperlipidemia Maternal Grandfather   . Hypertension Maternal Grandfather   . Hyperlipidemia Paternal Grandmother   . Hypertension Paternal Grandmother   . Hyperlipidemia Paternal Grandfather   . Hypertension Paternal Grandfather   . Heart attack Paternal Grandfather   . Sudden death Neg Hx     . Colon cancer Neg Hx   . Esophageal cancer Neg Hx   . Stomach cancer Neg Hx   . Rectal cancer Neg Hx    Social History   Socioeconomic History  . Marital status: Divorced    Spouse name: Not on file  . Number of children: 2  . Years of education: Not on file  . Highest education level: Not on file  Social Needs  . Financial resource strain: Not on file  . Food insecurity - worry: Not on file  . Food insecurity - inability: Not on file  . Transportation needs - medical: Not on file  . Transportation needs - non-medical: Not on file  Occupational History  . Occupation: Astronomer: [employer]  Tobacco Use  . Smoking status: Never Smoker  . Smokeless tobacco: Never Used  Substance and Sexual Activity  . Alcohol use: No    Alcohol/week: 0.0 oz  . Drug use: No  . Sexual activity: Not on file  Other Topics Concern  . Not on file  Social History Narrative   Regular exercise:  No   Caffeine use:  2--32oz cups daily      Past Surgical History:  Procedure Laterality Date  . ANKLE SURGERY    .  DOPPLER ECHOCARDIOGRAPHY  2010  . FOOT SURGERY    . KNEE SURGERY  08/04/08   Right knee-- medial meniscus repair, attenuation anterior cruciate [name] MD [facility])   . NM MYOVIEW LTD  2010  . PLANTAR FASCIA RELEASE    . SHOULDER SURGERY    . SLEEVE GASTROPLASTY  10/29/2016  . VARICOCELE EXCISION     Past Medical History:  Diagnosis Date  . ADHD (attention deficit hyperactivity disorder)   . Allergy   . Arthritis   . Bilateral varicoceles   . Depression   . Fatty liver 08/06/2011  . Genital herpes 10/25/2014  . GERD (gastroesophageal reflux disease)   . Hyperlipidemia   . Hypertension   . OSA (obstructive sleep apnea)   . Personal history of colonic polyps   . Plantar fasciitis   . PTSD (post-traumatic stress disorder)   . Sleep apnea    There were no vitals taken for this visit.  Opioid Risk Score:   Fall Risk Score:   `1  Depression screen PHQ 2/9  Depression screen [facility] 2/9 07/23/2016 01/01/2016 07/23/2015 06/25/2015 06/04/2015 05/15/2015 04/26/2015  Decreased Interest 2 0 0 1 1 1  0  Down, Depressed, Hopeless 0 0 0 1 1 1  0  PHQ - 2 Score 2 0 0 2 2 2  0  Altered sleeping 3 - - - - 3 -  Tired, decreased energy 3 - - - - 3 -  Change in appetite 3 - - - - 3 -  Feeling bad or failure about yourself  0 - - - - 0 -  Trouble concentrating 3 - - - - 3 -  Moving slowly or fidgety/restless 0 - - - - 0 -  Suicidal thoughts 0 - - - - 0 -  PHQ-9 Score 14 - - - - 14 -  Difficult doing work/chores - - - - - Extremely dIfficult -  Some recent data might be hidden     Review of Systems  Constitutional: Negative.   HENT: Negative.   Eyes: Negative.   Respiratory: Negative.   Cardiovascular: Negative.   Gastrointestinal: Negative.   Endocrine: Negative.   Genitourinary: Negative.   Musculoskeletal: Positive for back pain.  Skin: Negative.   Allergic/Immunologic: Negative.   Neurological: Negative.   Hematological: Negative.   All other systems reviewed and are negative.      Objective:   Physical Exam  Constitutional: He is oriented to person, place, and time. He appears well-developed and well-nourished. No distress.  HENT:  Head: Normocephalic and atraumatic.  Eyes: Conjunctivae and EOM are normal. Pupils are equal, round, and reactive to light.  Neck: Normal range of motion.  Musculoskeletal:  Mild tenderness palpation right PSIS area. Straight leg raising test   Neurological: He is alert and oriented to person, place, and time. Gait normal.  Extremity strength is 5/5 bilateral hip flexor knee extensor ankle dorsiflexor No sensory deficit in the thoracic area to pinprick or light touch.  Skin: He is not diaphoretic.  Psychiatric: He has a normal mood and affect.  Nursing note and vitals reviewed.         Assessment & Plan:  1.  Lumbar spondylosis without myelopathy.  He has had good results  with right radiofrequency neurotomy L3-L4 medial branches and L5 dorsal ramus last performed in June 2018.  He has persistent benefit.  We discussed that is difficult to say exactly how long this will last.  He will call to schedule another procedure if  this wears off. I think his current right lower back pain is mainly related to his shoveling snow  2.  History of sacroiliac disorder, has responded to sacroiliac injections in the past.  No need for repeat unless his current exacerbation does not subside in another week or 2

## 2017-03-26 NOTE — Patient Instructions (Signed)
Call for appt as needed

## 2017-03-27 ENCOUNTER — Ambulatory Visit: Admitting: Family

## 2017-04-03 ENCOUNTER — Encounter: Payer: Self-pay | Admitting: Family

## 2017-04-03 ENCOUNTER — Other Ambulatory Visit: Payer: Self-pay

## 2017-04-03 ENCOUNTER — Ambulatory Visit: Admitting: Physical Medicine & Rehabilitation

## 2017-04-03 ENCOUNTER — Emergency Department (HOSPITAL_BASED_OUTPATIENT_CLINIC_OR_DEPARTMENT_OTHER)
Admission: EM | Admit: 2017-04-03 | Discharge: 2017-04-03 | Disposition: A | Discharge status: Home / Self Care | Attending: Emergency Medicine | Admitting: Emergency Medicine

## 2017-04-03 ENCOUNTER — Encounter (HOSPITAL_BASED_OUTPATIENT_CLINIC_OR_DEPARTMENT_OTHER): Payer: Self-pay

## 2017-04-03 DIAGNOSIS — E1142 Type 2 diabetes mellitus with diabetic polyneuropathy: Secondary | ICD-10-CM | POA: Diagnosis not present

## 2017-04-03 DIAGNOSIS — R197 Diarrhea, unspecified: Secondary | ICD-10-CM | POA: Diagnosis not present

## 2017-04-03 DIAGNOSIS — Z79899 Other long term (current) drug therapy: Secondary | ICD-10-CM | POA: Insufficient documentation

## 2017-04-03 DIAGNOSIS — I10 Essential (primary) hypertension: Secondary | ICD-10-CM

## 2017-04-03 DIAGNOSIS — R51 Headache: Secondary | ICD-10-CM | POA: Diagnosis present

## 2017-04-03 LAB — TROPONIN I

## 2017-04-03 LAB — COMPREHENSIVE METABOLIC PANEL
ALT: 17 U/L (ref 17–63)
AST: 26 U/L (ref 15–41)
Albumin: 4.7 g/dL (ref 3.5–5.0)
Alkaline Phosphatase: 89 U/L (ref 38–126)
Anion gap: 9 (ref 5–15)
BILIRUBIN TOTAL: 0.7 mg/dL (ref 0.3–1.2)
BUN: 8 mg/dL (ref 6–20)
CHLORIDE: 108 mmol/L (ref 101–111)
CO2: 22 mmol/L (ref 22–32)
CREATININE: 0.91 mg/dL (ref 0.61–1.24)
Calcium: 9.5 mg/dL (ref 8.9–10.3)
Glucose, Bld: 127 mg/dL — ABNORMAL HIGH (ref 65–99)
POTASSIUM: 3.6 mmol/L (ref 3.5–5.1)
Sodium: 139 mmol/L (ref 135–145)
TOTAL PROTEIN: 8.5 g/dL — AB (ref 6.5–8.1)

## 2017-04-03 LAB — CBC WITH DIFFERENTIAL/PLATELET
Basophils Absolute: 0 10*3/uL (ref 0.0–0.1)
Basophils Relative: 0 %
EOS PCT: 1 %
Eosinophils Absolute: 0 10*3/uL (ref 0.0–0.7)
HEMATOCRIT: 46.8 % (ref 39.0–52.0)
Hemoglobin: 15.8 g/dL (ref 13.0–17.0)
LYMPHS ABS: 1.2 10*3/uL (ref 0.7–4.0)
LYMPHS PCT: 21 %
MCH: 29.8 pg (ref 26.0–34.0)
MCHC: 33.8 g/dL (ref 30.0–36.0)
MCV: 88.3 fL (ref 78.0–100.0)
MONO ABS: 0.4 10*3/uL (ref 0.1–1.0)
MONOS PCT: 7 %
Neutro Abs: 4.2 10*3/uL (ref 1.7–7.7)
Neutrophils Relative %: 71 %
PLATELETS: 290 10*3/uL (ref 150–400)
RBC: 5.3 MIL/uL (ref 4.22–5.81)
RDW: 13.6 % (ref 11.5–15.5)
WBC: 5.9 10*3/uL (ref 4.0–10.5)

## 2017-04-03 LAB — LIPASE, BLOOD: LIPASE: 31 U/L (ref 11–51)

## 2017-04-03 MED ORDER — SODIUM CHLORIDE 0.9 % IV BOLUS (SEPSIS)
1000.0000 mL | Freq: Once | INTRAVENOUS | Status: AC
Start: 2017-04-03 — End: 2017-04-03
  Administered 2017-04-03: 1000 mL via INTRAVENOUS

## 2017-04-03 MED ORDER — DIPHENHYDRAMINE HCL 50 MG/ML IJ SOLN
25.0000 mg | Freq: Once | INTRAMUSCULAR | Status: AC
Start: 2017-04-03 — End: 2017-04-03
  Administered 2017-04-03: 25 mg via INTRAVENOUS
  Filled 2017-04-03: qty 1

## 2017-04-03 MED ORDER — PROCHLORPERAZINE EDISYLATE 5 MG/ML IJ SOLN
10.0000 mg | Freq: Once | INTRAMUSCULAR | Status: AC
  Administered 2017-04-03: 10 mg via INTRAVENOUS
  Filled 2017-04-03: qty 2

## 2017-04-03 MED ORDER — ONDANSETRON 4 MG PO TBDP: 4.0000 mg | ORAL_TABLET | Freq: Three times a day (TID) | ORAL | 0 refills | Status: DC | PRN

## 2017-04-03 MED ORDER — LOPERAMIDE HCL 2 MG PO CAPS: 2.0000 mg | ORAL_CAPSULE | Freq: Four times a day (QID) | ORAL | 0 refills | Status: DC | PRN

## 2017-04-03 MED ORDER — HYDRALAZINE HCL 25 MG PO TABS
50.0000 mg | ORAL_TABLET | Freq: Once | ORAL | Status: AC
  Administered 2017-04-03: 50 mg via ORAL
  Filled 2017-04-03: qty 2

## 2017-04-03 NOTE — ED Triage Notes (Signed)
C/o HA started yesterday- BP was elevated at home-diarrhea x 4 episodes today-tingling to face x 1.5 hours-NAD-steady gait

## 2017-04-03 NOTE — ED Notes (Signed)
Pt actively vomiting in room prior to medication administration.

## 2017-04-03 NOTE — ED Provider Notes (Signed)
[place] EMERGENCY DEPARTMENT Provider Note   CSN: [id number] Arrival date & time: 04/03/17  1601     History   Chief Complaint Chief Complaint  Patient presents with  . Headache    HPI Charles Nash. is a 51 y.o. male.  HPI   Headache for 2 days, blood pressures have been increased.  Diarrhea x 8 since noon, one episode of vomiting. Gastric sleeve surgery in July.  Brief tingling to left face for 1 minute. No other tingling in arms, legs> No CP or dyspnea, or dizziness.  Headache, frontal and temporal. Has hx of headaches in back, tension headaches.  No chest pain or dyspnea. No hx of trauma. No known sick contacts. Ate salad just before diarrhea symptoms starting.    Past Medical History:  Diagnosis Date  . ADHD (attention deficit hyperactivity disorder)   . Allergy   . Arthritis   . Bilateral varicoceles   . Depression   . Fatty liver 08/06/2011  . Genital herpes 10/25/2014  . GERD (gastroesophageal reflux disease)   . Hyperlipidemia   . Hypertension   . OSA (obstructive sleep apnea)   . Personal history of colonic polyps   . Plantar fasciitis   . PTSD (post-traumatic stress disorder)   . Sleep apnea     Patient Active Problem List   Diagnosis Date Noted  . Myofascial pain dysfunction syndrome 01/02/2017  . Insect bites 11/10/2016  . Diabetic polyneuropathy associated with type 2 diabetes mellitus ([employer]) 08/25/2016  . Achilles tendinitis, left leg 08/25/2016  . Achilles tendon contracture, left 08/25/2016  . Xerosis of skin 04/22/2016  . Chronic urticaria 03/05/2016  . Sacroiliac joint dysfunction of both sides 01/01/2016  . Anxiety state 08/20/2015  . 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 Patellofemoral disorder 05/15/2015  . Current use of beta blocker 05/15/2015  . Genital herpes 10/25/2014  . Depression 10/05/2014  . Osteoarthritis of left knee 08/15/2014  . 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  Sciatica 04/22/2014  . Osteoarthritis of right knee 02/06/2014  . Allergy to mold 12/13/2013  . Weight gain, abnormal 11/08/2013  . PTSD (post-traumatic stress disorder) 07/05/2013  . Multinodular goiter 01/19/2013  . GERD (gastroesophageal reflux disease) 08/09/2012  . Dermographia 12/29/2011  . Neck pain 11/12/2011  . Fatty liver 08/06/2011  . Cervical disc disease 07/28/2011  . Preventative health care 07/28/2011  . Erectile dysfunction 05/23/2011  . Elevated liver function tests 05/20/2011  . Type II diabetes mellitus, well controlled ([place]) 05/19/2011  . Essential hypertension 05/19/2011  . Hyperlipidemia 05/19/2011  . Morbid obesity ([place]) 05/19/2011  . Other allergic rhinitis 05/19/2011  . Obstructive sleep apnea treated with continuous positive airway pressure (CPAP) 05/19/2011    Past Surgical History:  Procedure Laterality Date  . ANKLE SURGERY    . DOPPLER ECHOCARDIOGRAPHY  2010  . FOOT SURGERY    . KNEE SURGERY  08/04/08   Right knee-- medial meniscus repair, attenuation anterior cruciate [name] MD [facility])   . NM MYOVIEW LTD  2010  . PLANTAR FASCIA RELEASE    . SHOULDER SURGERY    . SLEEVE GASTROPLASTY  10/29/2016  . VARICOCELE EXCISION         Home Medications    Prior to Admission medications   Medication Sig Start Date End Date Taking? Authorizing Provider  atorvastatin (LIPITOR) 10 MG tablet Take 1 tablet (10 mg total) by mouth daily. 03/09/17   Debbrah Alar, NP  Blood Glucose Monitoring  Suppl (ACCU-CHEK GUIDE) w/Device KIT 1 each by Does not apply route daily. 11/26/16   Debbrah Alar, NP  buPROPion (WELLBUTRIN XL) 300 MG 24 hr tablet Take 1 tablet (300 mg total) by mouth daily. Patient taking differently: Take 150 mg by mouth daily.  12/26/16   Debbrah Alar, NP  CALCIUM-MAGNESIUM-VITAMIN D PO Take 1 tablet by mouth 3 (three) times daily.    [provider]  Cholecalciferol (VITAMIN D3) 5000 UNIT/ML LIQD  Take 5,000 Units by mouth daily.    [provider]  Cobalamine Combinations (Y-69 + FOLIC ACID PO) Take by mouth. 1/2 dropper ful once a day.    [provider]  fexofenadine (ALLEGRA) 180 MG tablet Take 1 tablet (180 mg total) by mouth daily as needed for allergies or rhinitis. 11/10/16   Bobbitt, Sedalia Muta, MD  fluticasone (FLONASE) 50 MCG/ACT nasal spray Place 2 sprays into both nostrils daily. 09/09/16   Debbrah Alar, NP  glucose blood (ACCU-CHEK GUIDE) test strip Use as instructed 11/26/16   Debbrah Alar, NP  hydrALAZINE (APRESOLINE) 50 MG tablet Take 1 tablet (50 mg total) by mouth 3 (three) times daily. 12/10/16   Debbrah Alar, NP  hydrocortisone (ANUSOL-HC) 2.5 % rectal cream Apply rectally 2 times daily 02/04/17   Ward, Ozella Almond, PA-C  KLOR-CON M20 20 MEQ tablet TAKE 1 TABLET DAILY 08/07/16   Debbrah Alar, NP  Lancets (FREESTYLE) lancets USE TO CHECK BLOOD SUGAR ONCE DAILY 04/02/15   Debbrah Alar, NP  lisinopril (PRINIVIL,ZESTRIL) 10 MG tablet Take 1 tablet (10 mg total) by mouth daily. 03/18/17   Debbrah Alar, NP  loperamide (IMODIUM) 2 MG capsule Take 1 capsule (2 mg total) by mouth 4 (four) times daily as needed for diarrhea or loose stools. 04/03/17   Gareth Morgan, MD  metoprolol succinate (TOPROL XL) 50 MG 24 hr tablet Take 1 tablet (50 mg total) by mouth daily. PLEASE CONTACT OFFICE FOR ADDITIONAL REFILLS 2ND ATTEMPT 03/16/17   Debbrah Alar, NP  montelukast (SINGULAIR) 10 MG tablet Take 1 tablet (10 mg total) by mouth at bedtime. Patient taking differently: Take 10 mg by mouth at bedtime as needed.  09/24/16   Debbrah Alar, NP  Multiple Vitamin (MULTI-VITAMINS) TABS Take 1 tablet by mouth daily.    [provider]  omeprazole (PRILOSEC) 20 MG capsule TAKE 2 CAPSULES EVERY MORNING 12/08/16   Debbrah Alar, NP  ondansetron (ZOFRAN ODT) 4 MG disintegrating tablet Take 1 tablet (4 mg total) by  mouth every 8 (eight) hours as needed for nausea or vomiting. 04/03/17   Gareth Morgan, MD  polyethylene glycol (MIRALAX / GLYCOLAX) packet  10/30/16   [provider]  PROAIR HFA 108 (90 Base) MCG/ACT inhaler Inhale 2 puffs into the lungs 4 (four) times daily as needed. 09/29/16   [provider]  Pyridoxine HCl (B-6 PO) Take 1 tablet by mouth daily.    [provider]  QUEtiapine (SEROQUEL) 25 MG tablet Take 1 tablet by mouth at bedtime. 03/13/17   [provider]  sildenafil (VIAGRA) 100 MG tablet TAKE 1/2 TO 1 TABLET BY MOUTH DAILY AS NEEDED 03/13/17   Debbrah Alar, NP  triamcinolone ointment (KENALOG) 0.1 % Apply to red itchy areas only below the face 04/22/16   Charlies Silvers, MD  vitamin C (ASCORBIC ACID) 500 MG tablet Take 500 mg by mouth daily.    [provider]    Family History Family History  Problem Relation Age of Onset  . Diabetes Mother   .  Hypertension Mother   . Arthritis Mother   . Cancer Mother        uterine and breast cancer, sarcoma of the brain  . Hyperlipidemia Father   . Arthritis Father   . Cancer Father        thyroid, prostate cancer  . Other Father        mass in stomach  . Hyperlipidemia Maternal Grandmother   . Hypertension Maternal Grandmother   . Hyperlipidemia Maternal Grandfather   . Hypertension Maternal Grandfather   . Hyperlipidemia Paternal Grandmother   . Hypertension Paternal Grandmother   . Hyperlipidemia Paternal Grandfather   . Hypertension Paternal Grandfather   . Heart attack Paternal Grandfather   . Sudden death Neg Hx   . Colon cancer Neg Hx   . Esophageal cancer Neg Hx   . Stomach cancer Neg Hx   . Rectal cancer Neg Hx     Social History Social History   Tobacco Use  . Smoking status: Never Smoker  . Smokeless tobacco: Never Used  Substance Use Topics  . Alcohol use: No    Alcohol/week: 0.0 oz  . Drug use: No     Allergies   Oxycodone-acetaminophen and Zoloft  [sertraline hcl]   Review of Systems Review of Systems  Constitutional: Negative for fever.  HENT: Negative for sore throat.   Eyes: Negative for visual disturbance.  Respiratory: Negative for shortness of breath.   Cardiovascular: Negative for chest pain.  Gastrointestinal: Positive for diarrhea, nausea and vomiting. Negative for abdominal pain.  Genitourinary: Negative for difficulty urinating.  Musculoskeletal: Negative for back pain and neck stiffness.  Skin: Negative for rash.  Neurological: Positive for headaches. Negative for dizziness, syncope, facial asymmetry, speech difficulty and weakness.     Physical Exam Updated Vital Signs BP (!) 170/110 (BP Location: Left Arm)   Pulse 82   Temp 98.4 F (36.9 C) (Oral)   Resp 18   Ht _0  (1.854 m)   Wt 122.5 kg (270 lb)   SpO2 98%   BMI 35.62 kg/m   Physical Exam  Constitutional: He is oriented to person, place, and time. He appears well-developed and well-nourished. No distress.  HENT:  Head: Normocephalic and atraumatic.  Eyes: Conjunctivae and EOM are normal.  Neck: Normal range of motion.  Cardiovascular: Normal rate, regular rhythm, normal heart sounds and intact distal pulses. Exam reveals no gallop and no friction rub.  No murmur heard. Pulmonary/Chest: Effort normal and breath sounds normal. No respiratory distress. He has no wheezes. He has no rales.  Abdominal: Soft. He exhibits no distension. There is no tenderness. There is no guarding.  Musculoskeletal: He exhibits no edema.  Neurological: He is alert and oriented to person, place, and time. No cranial nerve deficit or sensory deficit. GCS eye subscore is 4. GCS verbal subscore is 5. GCS motor subscore is 6.  Skin: Skin is warm and dry. He is not diaphoretic.  Nursing note and vitals reviewed.    ED Treatments / Results  Labs (all labs ordered are listed, but only abnormal results are displayed) Labs Reviewed  COMPREHENSIVE METABOLIC PANEL - Abnormal;  Notable for the following components:      Result Value   Glucose, Bld 127 (*)    Total Protein 8.5 (*)    All other components within normal limits  CBC WITH DIFFERENTIAL/PLATELET  LIPASE, BLOOD  TROPONIN I    EKG  EKG Interpretation  Date/Time:  Friday April 03 2017 18:22:57 EST Ventricular Rate:  85 PR Interval:    QRS Duration: 102 QT Interval:  413 QTC Calculation: 492 R Axis:   111 Text Interpretation:  Sinus rhythm Left posterior fascicular block Borderline prolonged QT interval No significant change since last tracing Confirmed by Gareth Morgan [phone]) on 04/04/2017 2:28:10 PM       Radiology No results found.  Procedures Procedures (including critical care time)  Medications Ordered in ED Medications  sodium chloride 0.9 % bolus 1,000 mL (0 mLs Intravenous Stopped 04/03/17 1906)  prochlorperazine (COMPAZINE) injection 10 mg (10 mg Intravenous Given 04/03/17 1812)  diphenhydrAMINE (BENADRYL) injection 25 mg (25 mg Intravenous Given 04/03/17 1813)  hydrALAZINE (APRESOLINE) tablet 50 mg (50 mg Oral Given 04/03/17 1906)     Initial Impression / Assessment and Plan / ED Course  I have reviewed the triage vital signs and the nursing notes.  Pertinent labs & imaging results that were available during my care of the patient were reviewed by me and considered in my medical decision making (see chart for details).      51yo male with history above including hypertension presents with concern of hypertension. Mild headache, diarrhea.   Patient without sudden onset severe headache, normal neuro exam, no chest pain, no shortness of breath and have low suspicion for hypertensive emergencies including low suspicion for [facility] [place], hypertensive encephalopathy, stroke, MI, aortic dissection, pulmonary edema. Had brief tingling only to face for less than one minute without other neuro symptoms and doubt this represents TIA/CVA in setting of multiple other symptoms such as  diarrhea. Labs WNL. Abdominal exam benign, no distention, having frequent stool, no sign of acute obstruction. Tolerating po after zofran in ED.  Doubt other acute complication of gastric sleeve.  Suspect likely viral vs bacterial gastroenteritis.  Recommend hydration. Will not change blood pressure medication given severe diarrhea at this time. Recommend PCP Follow up, continuing meds as rx. Given zofran and loperamide. No CDiff risk factors. Patient discharged in stable condition with understanding of reasons to return.   Final Clinical Impressions(s) / ED Diagnoses   Final diagnoses:  Essential hypertension  Diarrhea of presumed infectious origin    ED Discharge Orders        Ordered    ondansetron (ZOFRAN ODT) 4 MG disintegrating tablet  Every 8 hours PRN     04/03/17 1927    loperamide (IMODIUM) 2 MG capsule  4 times daily PRN     04/03/17 1927       Gareth Morgan, MD 04/04/17 1432

## 2017-04-03 NOTE — ED Notes (Signed)
Pt reports continued episodes of diarrhea. Will make MD aware.

## 2017-04-17 MED FILL — LISINOPRIL 10 MG TABS: 10 | 30 days supply | Qty: 30 | Fill #1

## 2017-04-27 ENCOUNTER — Encounter: Payer: Self-pay | Admitting: Family

## 2017-04-29 ENCOUNTER — Telehealth: Payer: Self-pay | Admitting: Family

## 2017-04-29 NOTE — Telephone Encounter (Signed)
Noted  

## 2017-04-29 NOTE — Telephone Encounter (Signed)
Notified pt. He states he is unable to come in today and wants to wait until he can see PCP on Friday. Appt scheduled for Friday at 1:40pm. Advised pt to bring home machine with him to office visit.

## 2017-04-29 NOTE — Telephone Encounter (Signed)
See mychart message. Please schedule patient an appointment so I can recheck in the office.

## 2017-05-01 ENCOUNTER — Encounter: Payer: Self-pay | Admitting: Family

## 2017-05-01 ENCOUNTER — Encounter: Payer: Self-pay | Admitting: *Deleted

## 2017-05-01 ENCOUNTER — Ambulatory Visit (INDEPENDENT_AMBULATORY_CARE_PROVIDER_SITE_OTHER): Admitting: Family

## 2017-05-01 VITALS — BP 154/107 | HR 68 | Temp 97.9°F | Resp 16 | Ht 73.0 in | Wt 270.4 lb

## 2017-05-01 DIAGNOSIS — I10 Essential (primary) hypertension: Secondary | ICD-10-CM

## 2017-05-01 LAB — BASIC METABOLIC PANEL
BUN: 8 mg/dL (ref 6–23)
CALCIUM: 9.5 mg/dL (ref 8.4–10.5)
CHLORIDE: 102 meq/L (ref 96–112)
CO2: 32 mEq/L (ref 19–32)
CREATININE: 0.9 mg/dL (ref 0.40–1.50)
GFR: 114.49 mL/min (ref >60.00)
Glucose, Bld: 83 mg/dL (ref 70–99)
Potassium: 3.7 mEq/L (ref 3.5–5.1)
Sodium: 141 mEq/L (ref 135–145)

## 2017-05-01 MED ORDER — LOSARTAN POTASSIUM 50 MG PO TABS
50.0000 mg | ORAL_TABLET | Freq: Every day | ORAL | 5 refills | Status: DC
Start: 2017-05-01 — End: 2017-07-17

## 2017-05-01 MED FILL — LOSARTAN POTASSIUM 50 MG TA: 50 | 30 days supply | Qty: 30 | Fill #0

## 2017-05-01 NOTE — Progress Notes (Signed)
Subjective:    Patient ID: Charles Nash., male    DOB: 1965/11/30, 52 y.o.   MRN: [id number]  HPI  Patient is a 52 year old male who presents today for follow-up of his blood pressure.  He contacted [place] earlier this week with report of elevated blood pressures at home.  He reports blood pressures ranging 160-177/90-110 at home.  He has had associated headache and blurred vision.  Last visit we added lisinopril 10 mg by mouth once daily to his regimen.  He was continued on hydralazine and Toprol-XL.  Reports + dry cough on lisinopril. Denies cp/sob or swelling.   BP Readings from Last 3 Encounters:  05/01/17 (!) 154/107  04/03/17 (!) 170/110  03/26/17 (!) 149/96     Review of Systems See HPI  Past Medical History:  Diagnosis Date  . ADHD (attention deficit hyperactivity disorder)   . Allergy   . Arthritis   . Bilateral varicoceles   . Depression   . Fatty liver 08/06/2011  . Genital herpes 10/25/2014  . GERD (gastroesophageal reflux disease)   . Hyperlipidemia   . Hypertension   . OSA (obstructive sleep apnea)   . Personal history of colonic polyps   . Plantar fasciitis   . PTSD (post-traumatic stress disorder)   . Sleep apnea      Social History   Socioeconomic History  . Marital status: Divorced    Spouse name: Not on file  . Number of children: 2  . Years of education: Not on file  . Highest education level: Not on file  Social Needs  . Financial resource strain: Not on file  . Food insecurity - worry: Not on file  . Food insecurity - inability: Not on file  . Transportation needs - medical: Not on file  . Transportation needs - non-medical: Not on file  Occupational History  . Occupation: Astronomer: [employer]  Tobacco Use  . Smoking status: Never Smoker  . Smokeless tobacco: Never Used  Substance and Sexual Activity  . Alcohol use: No    Alcohol/week: 0.0 oz  . Drug use: No  . Sexual activity: Not on file  Other Topics  Concern  . Not on file  Social History Narrative   Regular exercise:  No   Caffeine use:  2--32oz cups daily       Past Surgical History:  Procedure Laterality Date  . ANKLE SURGERY    . DOPPLER ECHOCARDIOGRAPHY  2010  . FOOT SURGERY    . KNEE SURGERY  08/04/08   Right knee-- medial meniscus repair, attenuation anterior cruciate [name] MD [facility])   . NM MYOVIEW LTD  2010  . PLANTAR FASCIA RELEASE    . SHOULDER SURGERY    . SLEEVE GASTROPLASTY  10/29/2016  . VARICOCELE EXCISION      Family History  Problem Relation Age of Onset  . Diabetes Mother   . Hypertension Mother   . Arthritis Mother   . Cancer Mother        uterine and breast cancer, sarcoma of the brain  . Hyperlipidemia Father   . Arthritis Father   . Cancer Father        thyroid, prostate cancer  . Other Father        mass in stomach  . Hyperlipidemia Maternal Grandmother   . Hypertension Maternal Grandmother   . Hyperlipidemia Maternal Grandfather   . Hypertension Maternal Grandfather   . Hyperlipidemia Paternal Grandmother   .  Hypertension Paternal Grandmother   . Hyperlipidemia Paternal Grandfather   . Hypertension Paternal Grandfather   . Heart attack Paternal Grandfather   . Sudden death Neg Hx   . Colon cancer Neg Hx   . Esophageal cancer Neg Hx   . Stomach cancer Neg Hx   . Rectal cancer Neg Hx     Allergies  Allergen Reactions  . Oxycodone-Acetaminophen Hives and Itching  . Zoloft [Sertraline Hcl]     ?weight gain. "made me feel weird"    Current Outpatient Medications on File Prior to Visit  Medication Sig Dispense Refill  . atorvastatin (LIPITOR) 10 MG tablet Take 1 tablet (10 mg total) by mouth daily. 14 tablet 0  . Blood Glucose Monitoring Suppl (ACCU-CHEK GUIDE) w/Device KIT 1 each by Does not apply route daily. 1 kit 0  . buPROPion (WELLBUTRIN XL) 300 MG 24 hr tablet Take 1 tablet (300 mg total) by mouth daily. (Patient taking differently: Take 150 mg by mouth  daily. )    . CALCIUM-MAGNESIUM-VITAMIN D PO Take 1 tablet by mouth 3 (three) times daily.    . Cholecalciferol (VITAMIN D3) 5000 UNIT/ML LIQD Take 5,000 Units by mouth daily.    . Cobalamine Combinations (F-81 + FOLIC ACID PO) Take by mouth. 1/2 dropper ful once a day.    . fexofenadine (ALLEGRA) 180 MG tablet Take 1 tablet (180 mg total) by mouth daily as needed for allergies or rhinitis. 30 tablet 2  . fluticasone (FLONASE) 50 MCG/ACT nasal spray Place 2 sprays into both nostrils daily. 16 g 6  . glucose blood (ACCU-CHEK GUIDE) test strip Use as instructed 100 each 1  . hydrALAZINE (APRESOLINE) 50 MG tablet Take 1 tablet (50 mg total) by mouth 3 (three) times daily. 180 tablet 1  . hydrocortisone (ANUSOL-HC) 2.5 % rectal cream Apply rectally 2 times daily 28 g 0  . KLOR-CON M20 20 MEQ tablet TAKE 1 TABLET DAILY 90 tablet 1  . Lancets (FREESTYLE) lancets USE TO CHECK BLOOD SUGAR ONCE DAILY 100 each 1  . lisinopril (PRINIVIL,ZESTRIL) 10 MG tablet Take 1 tablet (10 mg total) by mouth daily. 30 tablet 3  . loperamide (IMODIUM) 2 MG capsule Take 1 capsule (2 mg total) by mouth 4 (four) times daily as needed for diarrhea or loose stools. 12 capsule 0  . metoprolol succinate (TOPROL XL) 50 MG 24 hr tablet Take 1 tablet (50 mg total) by mouth daily. PLEASE CONTACT OFFICE FOR ADDITIONAL REFILLS 2ND ATTEMPT 90 tablet 1  . montelukast (SINGULAIR) 10 MG tablet Take 1 tablet (10 mg total) by mouth at bedtime. (Patient taking differently: Take 10 mg by mouth at bedtime as needed. ) 30 tablet 3  . Multiple Vitamin (MULTI-VITAMINS) TABS Take 1 tablet by mouth daily.    Marland Kitchen omeprazole (PRILOSEC) 20 MG capsule TAKE 2 CAPSULES EVERY MORNING 180 capsule 1  . ondansetron (ZOFRAN ODT) 4 MG disintegrating tablet Take 1 tablet (4 mg total) by mouth every 8 (eight) hours as needed for nausea or vomiting. 20 tablet 0  . polyethylene glycol (MIRALAX / GLYCOLAX) packet     . PROAIR HFA 108 (90 Base) MCG/ACT inhaler Inhale 2  puffs into the lungs 4 (four) times daily as needed.    . Pyridoxine HCl (B-6 PO) Take 1 tablet by mouth daily.    . QUEtiapine (SEROQUEL) 25 MG tablet Take 1 tablet by mouth at bedtime.    . sildenafil (VIAGRA) 100 MG tablet TAKE 1/2 TO 1 TABLET BY MOUTH  DAILY AS NEEDED 15 tablet 1  . triamcinolone ointment (KENALOG) 0.1 % Apply to red itchy areas only below the face 80 g 3  . vitamin C (ASCORBIC ACID) 500 MG tablet Take 500 mg by mouth daily.    Marland Kitchen VYVANSE 20 MG capsule     . [DISCONTINUED] potassium chloride (K-DUR) 10 MEQ tablet Take 1 tablet (10 mEq total) by mouth daily. 30 tablet 1   Current Facility-Administered Medications on File Prior to Visit  Medication Dose Route Frequency Provider Last Rate Last Dose  . 0.9 %  sodium chloride infusion  500 mL Intravenous Continuous Ladene Artist, MD        BP (!) 154/107 (BP Location: Left Arm, Patient Position: Sitting, Cuff Size: Large)   Pulse 68   Temp 97.9 F (36.6 C) (Oral)   Resp 16   Ht 6' 1" (1.854 m)   Wt 270 lb 6.4 oz (122.7 kg)   SpO2 100%   BMI 35.67 kg/m       Objective:   Physical Exam  Constitutional: He is oriented to person, place, and time. He appears well-developed and well-nourished. No distress.  HENT:  Head: Normocephalic and atraumatic.  Cardiovascular: Normal rate and regular rhythm.  No murmur heard. Pulmonary/Chest: Effort normal and breath sounds normal. No respiratory distress. He has no wheezes. He has no rales.  Musculoskeletal: He exhibits no edema.  Neurological: He is alert and oriented to person, place, and time.  Skin: Skin is warm and dry.  Psychiatric: He has a normal mood and affect. His behavior is normal. Thought content normal.          Assessment & Plan:  HTN- Uncontrolled. Having cough with lisinopril. D/c lisinopril, start losartan.  Obtain bmet.

## 2017-05-01 NOTE — Patient Instructions (Addendum)
Stop lisinopril start losartan.  Complete lab work prior to leaving.

## 2017-05-15 ENCOUNTER — Encounter: Payer: Self-pay | Admitting: Family

## 2017-05-18 NOTE — Telephone Encounter (Signed)
Letter placed at front desk for pt to pick up.

## 2017-05-20 ENCOUNTER — Ambulatory Visit (INDEPENDENT_AMBULATORY_CARE_PROVIDER_SITE_OTHER): Admitting: Family

## 2017-05-20 VITALS — BP 163/99 | HR 78 | Temp 99.0°F | Resp 16 | Ht 73.0 in | Wt 264.4 lb

## 2017-05-20 DIAGNOSIS — B349 Viral infection, unspecified: Secondary | ICD-10-CM | POA: Diagnosis not present

## 2017-05-20 DIAGNOSIS — I10 Essential (primary) hypertension: Secondary | ICD-10-CM

## 2017-05-20 MED ORDER — METOPROLOL SUCCINATE ER 100 MG PO TB24: 100.0000 mg | ORAL_TABLET | Freq: Every day | ORAL | 5 refills | Status: DC

## 2017-05-20 MED FILL — METOPROLOL SUCC ER 100 MG T: 100 | 30 days supply | Qty: 30 | Fill #0

## 2017-05-20 NOTE — Patient Instructions (Signed)
Please increase toprol xl fom 50mg  to 100mg .

## 2017-05-20 NOTE — Progress Notes (Signed)
Subjective:    Patient ID: Charles Courier., male    DOB: December 15, 1965, 52 y.o.   MRN: [id number]  HPI  Patient is a 52 yr old male who presents today for follow up.  1) HTN- maintained on hydralazine, losartan, metoprolol,  BP Readings from Last 3 Encounters:  05/20/17 (!) 163/99  05/01/17 (!) 154/107  04/03/17 (!) 170/110   2) Sinus congestion- started 2 days ago, started mucinex. Then had some vomiting. Reports that he is tolerating PO's today and is felling a little.    Review of Systems See HPI  Past Medical History:  Diagnosis Date  . ADHD (attention deficit hyperactivity disorder)   . Allergy   . Arthritis   . Bilateral varicoceles   . Depression   . Fatty liver 08/06/2011  . Genital herpes 10/25/2014  . GERD (gastroesophageal reflux disease)   . Hyperlipidemia   . Hypertension   . OSA (obstructive sleep apnea)   . Personal history of colonic polyps   . Plantar fasciitis   . PTSD (post-traumatic stress disorder)   . Sleep apnea      Social History   Socioeconomic History  . Marital status: Divorced    Spouse name: Not on file  . Number of children: 2  . Years of education: Not on file  . Highest education level: Not on file  Social Needs  . Financial resource strain: Not on file  . Food insecurity - worry: Not on file  . Food insecurity - inability: Not on file  . Transportation needs - medical: Not on file  . Transportation needs - non-medical: Not on file  Occupational History  . Occupation: Astronomer: [employer]  Tobacco Use  . Smoking status: Never Smoker  . Smokeless tobacco: Never Used  Substance and Sexual Activity  . Alcohol use: No    Alcohol/week: 0.0 oz  . Drug use: No  . Sexual activity: Not on file  Other Topics Concern  . Not on file  Social History Narrative   Regular exercise:  No   Caffeine use:  2--32oz cups daily       Past Surgical History:  Procedure Laterality Date  . ANKLE SURGERY    .  DOPPLER ECHOCARDIOGRAPHY  2010  . FOOT SURGERY    . KNEE SURGERY  08/04/08   Right knee-- medial meniscus repair, attenuation anterior cruciate [name] MD [facility])   . NM MYOVIEW LTD  2010  . PLANTAR FASCIA RELEASE    . SHOULDER SURGERY    . SLEEVE GASTROPLASTY  10/29/2016  . VARICOCELE EXCISION      Family History  Problem Relation Age of Onset  . Diabetes Mother   . Hypertension Mother   . Arthritis Mother   . Cancer Mother        uterine and breast cancer, sarcoma of the brain  . Hyperlipidemia Father   . Arthritis Father   . Cancer Father        thyroid, prostate cancer  . Other Father        mass in stomach  . Hyperlipidemia Maternal Grandmother   . Hypertension Maternal Grandmother   . Hyperlipidemia Maternal Grandfather   . Hypertension Maternal Grandfather   . Hyperlipidemia Paternal Grandmother   . Hypertension Paternal Grandmother   . Hyperlipidemia Paternal Grandfather   . Hypertension Paternal Grandfather   . Heart attack Paternal Grandfather   . Sudden death Neg Hx   . Colon  cancer Neg Hx   . Esophageal cancer Neg Hx   . Stomach cancer Neg Hx   . Rectal cancer Neg Hx     Allergies  Allergen Reactions  . Oxycodone-Acetaminophen Hives and Itching  . Zoloft [Sertraline Hcl]     ?weight gain. "made me feel weird"    Current Outpatient Medications on File Prior to Visit  Medication Sig Dispense Refill  . atorvastatin (LIPITOR) 10 MG tablet Take 1 tablet (10 mg total) by mouth daily. 14 tablet 0  . Blood Glucose Monitoring Suppl (ACCU-CHEK GUIDE) w/Device KIT 1 each by Does not apply route daily. 1 kit 0  . buPROPion (WELLBUTRIN XL) 300 MG 24 hr tablet Take 1 tablet (300 mg total) by mouth daily. (Patient taking differently: Take 150 mg by mouth daily. )    . CALCIUM-MAGNESIUM-VITAMIN D PO Take 1 tablet by mouth 3 (three) times daily.    . Cholecalciferol (VITAMIN D3) 5000 UNIT/ML LIQD Take 5,000 Units by mouth daily.    . Cobalamine  Combinations (D-63 + FOLIC ACID PO) Take by mouth. 1/2 dropper ful once a day.    . fexofenadine (ALLEGRA) 180 MG tablet Take 1 tablet (180 mg total) by mouth daily as needed for allergies or rhinitis. 30 tablet 2  . fluticasone (FLONASE) 50 MCG/ACT nasal spray Place 2 sprays into both nostrils daily. 16 g 6  . glucose blood (ACCU-CHEK GUIDE) test strip Use as instructed 100 each 1  . hydrALAZINE (APRESOLINE) 50 MG tablet Take 1 tablet (50 mg total) by mouth 3 (three) times daily. 180 tablet 1  . hydrocortisone (ANUSOL-HC) 2.5 % rectal cream Apply rectally 2 times daily 28 g 0  . KLOR-CON M20 20 MEQ tablet TAKE 1 TABLET DAILY 90 tablet 1  . Lancets (FREESTYLE) lancets USE TO CHECK BLOOD SUGAR ONCE DAILY 100 each 1  . loperamide (IMODIUM) 2 MG capsule Take 1 capsule (2 mg total) by mouth 4 (four) times daily as needed for diarrhea or loose stools. 12 capsule 0  . losartan (COZAAR) 50 MG tablet Take 1 tablet (50 mg total) by mouth daily. 30 tablet 5  . metoprolol succinate (TOPROL XL) 50 MG 24 hr tablet Take 1 tablet (50 mg total) by mouth daily. PLEASE CONTACT OFFICE FOR ADDITIONAL REFILLS 2ND ATTEMPT 90 tablet 1  . montelukast (SINGULAIR) 10 MG tablet Take 1 tablet (10 mg total) by mouth at bedtime. (Patient taking differently: Take 10 mg by mouth at bedtime as needed. ) 30 tablet 3  . Multiple Vitamin (MULTI-VITAMINS) TABS Take 1 tablet by mouth daily.    Marland Kitchen omeprazole (PRILOSEC) 20 MG capsule TAKE 2 CAPSULES EVERY MORNING 180 capsule 1  . ondansetron (ZOFRAN ODT) 4 MG disintegrating tablet Take 1 tablet (4 mg total) by mouth every 8 (eight) hours as needed for nausea or vomiting. 20 tablet 0  . polyethylene glycol (MIRALAX / GLYCOLAX) packet     . PROAIR HFA 108 (90 Base) MCG/ACT inhaler Inhale 2 puffs into the lungs 4 (four) times daily as needed.    . Pyridoxine HCl (B-6 PO) Take 1 tablet by mouth daily.    . QUEtiapine (SEROQUEL) 25 MG tablet Take 1 tablet by mouth at bedtime.    . sildenafil  (VIAGRA) 100 MG tablet TAKE 1/2 TO 1 TABLET BY MOUTH DAILY AS NEEDED 15 tablet 1  . triamcinolone ointment (KENALOG) 0.1 % Apply to red itchy areas only below the face 80 g 3  . vitamin C (ASCORBIC ACID) 500 MG  tablet Take 500 mg by mouth daily.    Marland Kitchen VYVANSE 20 MG capsule     . [DISCONTINUED] potassium chloride (K-DUR) 10 MEQ tablet Take 1 tablet (10 mEq total) by mouth daily. 30 tablet 1   Current Facility-Administered Medications on File Prior to Visit  Medication Dose Route Frequency Provider Last Rate Last Dose  . 0.9 %  sodium chloride infusion  500 mL Intravenous Continuous Ladene Artist, MD        BP (!) 163/99 (BP Location: Left Arm, Patient Position: Sitting, Cuff Size: Large)   Pulse 78   Temp 99 F (37.2 C) (Oral)   Resp 16   Ht '6\' 1"'$  (1.854 m)   Wt 264 lb 6.4 oz (119.9 kg)   SpO2 100%   BMI 34.88 kg/m       Objective:   Physical Exam  Constitutional: He is oriented to person, place, and time. He appears well-developed and well-nourished. No distress.  HENT:  Head: Normocephalic and atraumatic.  Right Ear: Tympanic membrane and ear canal normal.  Left Ear: Tympanic membrane and ear canal normal.  Mouth/Throat: No oropharyngeal exudate, posterior oropharyngeal edema or posterior oropharyngeal erythema.  Cardiovascular: Normal rate and regular rhythm.  No murmur heard. Pulmonary/Chest: Effort normal and breath sounds normal. No respiratory distress. He has no wheezes. He has no rales.  Musculoskeletal: He exhibits no edema.  Neurological: He is alert and oriented to person, place, and time.  Skin: Skin is warm and dry.  Psychiatric: He has a normal mood and affect. His behavior is normal. Thought content normal.          Assessment & Plan:  Hypertension-controlled.  Will increase Toprol-XL from 50 mg to 100 mg once daily.  Viral illness- seems to be improving.  We discussed supportive measures.  He is advised to call if new or worsening symptoms or if  symptoms do not continue to improve.

## 2017-05-22 ENCOUNTER — Encounter: Payer: Self-pay | Admitting: Family

## 2017-06-02 ENCOUNTER — Encounter: Payer: Self-pay | Admitting: Family

## 2017-06-03 ENCOUNTER — Ambulatory Visit (INDEPENDENT_AMBULATORY_CARE_PROVIDER_SITE_OTHER): Admitting: Family

## 2017-06-03 VITALS — BP 134/87 | HR 76 | Resp 18

## 2017-06-03 DIAGNOSIS — I10 Essential (primary) hypertension: Secondary | ICD-10-CM | POA: Diagnosis not present

## 2017-06-03 MED ORDER — HYDRALAZINE HCL 50 MG PO TABS: 50.0000 mg | ORAL_TABLET | Freq: Three times a day (TID) | ORAL | 0 refills | Status: DC

## 2017-06-03 MED ORDER — PROAIR HFA 108 (90 BASE) MCG/ACT IN AERS: 2.0000 | INHALATION_SPRAY | Freq: Four times a day (QID) | RESPIRATORY_TRACT | 0 refills | Status: DC | PRN

## 2017-06-03 MED ORDER — METOPROLOL SUCCINATE ER 100 MG PO TB24: 100.0000 mg | ORAL_TABLET | Freq: Every day | ORAL | 1 refills | Status: DC

## 2017-06-03 MED FILL — PROAIR HFA 90 MCG INHALER: 108 (90 BAS | 17 days supply | Qty: 9 | Fill #0

## 2017-06-03 NOTE — Progress Notes (Signed)
Noted and agree. 

## 2017-06-03 NOTE — Progress Notes (Signed)
Pre visit review using our clinic review tool, if applicable. No additional management support is needed unless otherwise documented below in the visit note.  Pt here for BP check per 05/20/17 order of Debbrah Alar, NP  Pt is currently maintained on: Hydralazine 50mg  three times daily Losartan 50mg  once a day Metoprolol XL was just increased from 50mg  to 100mg  once a day at last visit.  Pt reports compliance with medications.  BP Readings from Last 3 Encounters:  05/20/17 (!) 163/99  05/01/17 (!) 154/107  04/03/17 (!) 170/110   BP today = 134/87.  Advised pt per verbal from PCP, ok to continue current medications / doses and follow up with PCP in 3 months. Appointment emailed to pt.

## 2017-06-03 NOTE — Telephone Encounter (Signed)
Notified pt per verbal from PCP that she can do letter about medication but she will not be able to give medical clearance to drive as that is the purpose of the DOT Provider to give clearance. Historically, the DOT Provider will reach out to the PCP if there are questions and then DOT will determine if clearance will be given. Pt then states he is unsure if letter needs to specify valium or should include statement that he is not taking any medications that would prohibit him from driving a vehicle. Advised pt to upload a copy of letter to [facility] and then we will proceed with letter. Awaiting letter from pt.

## 2017-06-03 NOTE — Patient Instructions (Addendum)
Continue your current blood pressure medications as follows:  Hydralazine 50mg  three times daily Losartan 50mg  once a day Metoprolol XL 100mg  once a day.  I have sent refills of hydralazine and metoprolol to [facility]. We sent a refill of Proair to the [place] for your cough. If your cough does not improve or gets worse please schedule an office visit with [facility] for further evaluation.

## 2017-06-04 ENCOUNTER — Encounter: Payer: Self-pay | Admitting: Family

## 2017-06-06 ENCOUNTER — Other Ambulatory Visit: Payer: Self-pay | Admitting: Family

## 2017-06-08 ENCOUNTER — Encounter: Payer: Self-pay | Admitting: Family

## 2017-06-08 ENCOUNTER — Telehealth: Payer: Self-pay | Admitting: Family

## 2017-06-08 NOTE — Telephone Encounter (Signed)
-----   Message from Mosie Lukes, MD sent at 06/07/2017  6:13 PM EST ----- This is not dissimilar from a DMV form we would do and it says from your physician it does not ask for a DOT physical. As long as you have seen him in past year and feel he is fit you could do it. It would be justifiable to ask him to come in also.  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  I have never been asked to do this and thought that would need to come from DOT physical physician.  He states he already had DOT physical.   What are your thoughts on this?  Thanks,  [employer]

## 2017-06-22 MED FILL — LOSARTAN POTASSIUM 50 MG TA: 50 | 30 days supply | Qty: 30 | Fill #1

## 2017-06-25 ENCOUNTER — Encounter: Payer: Self-pay | Admitting: Allergy

## 2017-06-25 ENCOUNTER — Ambulatory Visit (INDEPENDENT_AMBULATORY_CARE_PROVIDER_SITE_OTHER): Admitting: Allergy

## 2017-06-25 ENCOUNTER — Telehealth: Payer: Self-pay

## 2017-06-25 VITALS — BP 130/80 | HR 72 | Resp 16

## 2017-06-25 DIAGNOSIS — L234 Allergic contact dermatitis due to dyes: Secondary | ICD-10-CM | POA: Diagnosis not present

## 2017-06-25 MED ORDER — CLOBETASOL PROPIONATE 0.05 % EX SHAM: MEDICATED_SHAMPOO | CUTANEOUS | 0 refills | Status: DC

## 2017-06-25 NOTE — Telephone Encounter (Signed)
Patient has called back - still itching Wants to know what he can do to get some relief Does he need to make an appt Please cal

## 2017-06-25 NOTE — Telephone Encounter (Signed)
Patient is calling because he dyed his hair, mustache, and beard. He started having a reaction with bumps he thinks Tuesday. He has tried to wash his hair with 50% vinegar and water. He is still itching and reacting. He is wondering if he could have something for itching.   Please Advise

## 2017-06-25 NOTE — Telephone Encounter (Signed)
Scheduled appointment for 5 for the patient to come in to be seen

## 2017-06-25 NOTE — Patient Instructions (Addendum)
Reaction to hair dye  - you have had reaction to hair likely to PPD (paraphenylenediamine)  component in hair dye.   Recommend avoidance of hair dyes  - for treatment of current reaction recommend use of triamcinolone ointment to affected areas on the face thin layer 1-2 times a day for next several days  - for scalp you Clobex (steroid) shampoo apply thin film to dry scalp once daily; leave in place for 15 minutes, then add water, lather, and rinse thoroughly - also provided with steroid pack to take as well if above therapies are not effective enough at treating inflamed skin - may use benadryl as needed for itch control.  Recommend taking zyrtec 10mg  during the day to help with itch as a non-sedating option.      Return if symptoms worsen or fail to improve.

## 2017-06-25 NOTE — Progress Notes (Signed)
Follow-up Note  RE: Charles Nash. MRN: [id number] DOB: 1966/01/14 Date of Office Visit: 06/25/2017   History of present illness: Charles Schurman. is a 52 y.o. male presenting today for sick visit.  He was last seen in the office on 11/10/16 by Dr. Verlin Fester.  He has a history of allergic rhinitis.  He presents today as he died his hair 2 days ago using a brand he does not believe he has used before.  He does state he has dyed his hair years ago and recalls having a reaction to that as well.  He states he put in the hair dye on his scalp and beard hair.  He then noticed intense itching that started a day later as well as a bumpy rash more so on his beard.  He feels that the bumps are forming scabs but he denies having any blisters or oozing.  He denies any swelling.  No fevers no respiratory, GI or CV related symptoms.  He states he has tried to treat his symptoms with a 50-50 combination of water and apple cider vinegar however he states this just made things sting and feel worse.  He has not tried any antihistamines to help with the itch.  Review of systems: Review of Systems  Constitutional: Negative for chills, fever and malaise/fatigue.  HENT: Negative for congestion, ear discharge, nosebleeds and sore throat.   Eyes: Negative for pain, discharge and redness.  Respiratory: Negative for cough, shortness of breath and wheezing.   Cardiovascular: Negative for chest pain.  Gastrointestinal: Negative for abdominal pain, constipation, diarrhea, heartburn, nausea and vomiting.  Musculoskeletal: Negative for joint pain.  Skin: Positive for itching and rash.  Neurological: Negative for headaches.    All other systems negative unless noted above in HPI  Past medical/social/surgical/family history have been reviewed and are unchanged unless specifically indicated below.  No changes  Medication List: Allergies as of 06/25/2017      Reactions   Oxycodone-acetaminophen Hives, Itching   Zoloft [sertraline Hcl]    ?weight gain. "made me feel weird"      Medication List        Accurate as of 06/25/17  6:05 PM. Always use your most recent med list.          ACCU-CHEK GUIDE w/Device Kit 1 each by Does not apply route daily.   atorvastatin 10 MG tablet Commonly known as:  LIPITOR Take 1 tablet (10 mg total) by mouth daily.   J-24 + FOLIC ACID PO Take by mouth. 1/2 dropper ful once a day.   B-6 PO Take 1 tablet by mouth daily.   buPROPion 300 MG 24 hr tablet Commonly known as:  WELLBUTRIN XL Take 1 tablet (300 mg total) by mouth daily.   CALCIUM-MAGNESIUM-VITAMIN D PO Take 1 tablet by mouth 3 (three) times daily.   Clobetasol Propionate 0.05 % shampoo Commonly known as:  CLOBEX Apply thin film to dry scalp once daily; leave in place for 15 minutes, then add water, lather, and rinse thoroughly   clomiPHENE 50 MG tablet Commonly known as:  CLOMID   fexofenadine 180 MG tablet Commonly known as:  ALLEGRA Take 1 tablet (180 mg total) by mouth daily as needed for allergies or rhinitis.   fluticasone 50 MCG/ACT nasal spray Commonly known as:  FLONASE Place 2 sprays into both nostrils daily.   freestyle lancets USE TO CHECK BLOOD SUGAR ONCE DAILY   glucose blood test strip Commonly known as:  ACCU-CHEK GUIDE  Use as instructed   hydrALAZINE 50 MG tablet Commonly known as:  APRESOLINE Take 1 tablet (50 mg total) by mouth 3 (three) times daily.   hydrocortisone 2.5 % rectal cream Commonly known as:  ANUSOL-HC Apply rectally 2 times daily   KLOR-CON M20 20 MEQ tablet Generic drug:  potassium chloride SA TAKE 1 TABLET DAILY   loperamide 2 MG capsule Commonly known as:  IMODIUM Take 1 capsule (2 mg total) by mouth 4 (four) times daily as needed for diarrhea or loose stools.   losartan 50 MG tablet Commonly known as:  COZAAR Take 1 tablet (50 mg total) by mouth daily.   metoprolol succinate 100 MG 24 hr tablet Commonly known as:  TOPROL XL Take  1 tablet (100 mg total) by mouth daily. Take with or immediately following a meal.   montelukast 10 MG tablet Commonly known as:  SINGULAIR Take 1 tablet (10 mg total) by mouth at bedtime.   MULTI-VITAMINS Tabs Take 1 tablet by mouth daily.   omeprazole 20 MG capsule Commonly known as:  PRILOSEC TAKE 2 CAPSULES EVERY MORNING   ondansetron 4 MG disintegrating tablet Commonly known as:  ZOFRAN ODT Take 1 tablet (4 mg total) by mouth every 8 (eight) hours as needed for nausea or vomiting.   polyethylene glycol packet Commonly known as:  MIRALAX / GLYCOLAX   PROAIR HFA 108 (90 Base) MCG/ACT inhaler Generic drug:  albuterol Inhale 2 puffs into the lungs 4 (four) times daily as needed.   QUEtiapine 25 MG tablet Commonly known as:  SEROQUEL Take 1 tablet by mouth at bedtime.   sildenafil 100 MG tablet Commonly known as:  VIAGRA TAKE 1/2 TO 1 TABLET BY MOUTH DAILY AS NEEDED   triamcinolone ointment 0.1 % Commonly known as:  KENALOG Apply to red itchy areas only below the face   vitamin C 500 MG tablet Commonly known as:  ASCORBIC ACID Take 500 mg by mouth daily.   Vitamin D3 5000 UNIT/ML Liqd Take 5,000 Units by mouth daily.   VYVANSE 20 MG capsule Generic drug:  lisdexamfetamine       Known medication allergies: Allergies  Allergen Reactions  . Oxycodone-Acetaminophen Hives and Itching  . Zoloft [Sertraline Hcl]     ?weight gain. "made me feel weird"     Physical examination: Blood pressure 130/80, pulse 72, resp. rate 16.  General: Alert, interactive, in no acute distress. HEENT: PERRLA, TMs pearly gray, turbinates minimally edematous without discharge, post-pharynx non erythematous. Neck: Supple without lymphadenopathy. Lungs: Clear to auscultation without wheezing, rhonchi or rales. {no increased work of breathing. CV: Normal S1, S2 without murmurs. Abdomen: Nondistended, nontender. Skin: Scalp with erythematous maculopapular rash of varying sizes, beard  line and around the mouth with erythematous papules without pustules. Extremities:  No clubbing, cyanosis or edema. Neuro:   Grossly intact.  Diagnositics/Labs: None today  Assessment and plan:   Reaction to hair dye  - you have had a reaction to hair dye likely to PPD (paraphenylenediamine) component in hair dye.   Recommend avoidance of hair dyes  - for treatment of current reaction recommend use of triamcinolone ointment to affected areas on the face thin layer 1-2 times a day for next several days  - for scalp use Clobex shampoo apply thin film to dry scalp once daily; leave in place for 15 minutes, then add water, lather, and rinse thoroughly - also provided with steroid pack to take as well if above therapies are not effective enough at  treating inflamed skin - may use benadryl as needed for itch control.  Recommend taking zyrtec '10mg'$  during the day to help with itch as a non-sedating option.      Return if symptoms worsen or fail to improve.  I appreciate the opportunity to take part in [facility] care. Please do not hesitate to contact me with questions.  Sincerely,   Prudy Feeler, MD Allergy/Immunology [facility]

## 2017-06-25 NOTE — Telephone Encounter (Signed)
Voicemail box full. Unable to leave message. Patient will need an office visit as this is a new issue and should be evaluated first before prescribing any medications.

## 2017-06-26 ENCOUNTER — Ambulatory Visit: Payer: Self-pay | Admitting: Medical

## 2017-07-06 ENCOUNTER — Encounter: Payer: Self-pay | Admitting: Family

## 2017-07-06 DIAGNOSIS — H9190 Unspecified hearing loss, unspecified ear: Secondary | ICD-10-CM

## 2017-07-06 DIAGNOSIS — L989 Disorder of the skin and subcutaneous tissue, unspecified: Secondary | ICD-10-CM

## 2017-07-08 ENCOUNTER — Telehealth: Payer: Self-pay | Admitting: *Deleted

## 2017-07-08 NOTE — Telephone Encounter (Signed)
Pt brings in handicap parking application. Form placed in PCP for approval / signature. Advised pt we will call him once form is ready.

## 2017-07-09 ENCOUNTER — Encounter: Payer: Self-pay | Admitting: Family

## 2017-07-09 NOTE — Telephone Encounter (Signed)
Please advise pt that now that he has lost so much weight and is more mobile I don't think he needs a handicapped placard any longer.

## 2017-07-10 ENCOUNTER — Ambulatory Visit (INDEPENDENT_AMBULATORY_CARE_PROVIDER_SITE_OTHER): Admitting: Podiatry

## 2017-07-10 ENCOUNTER — Ambulatory Visit (INDEPENDENT_AMBULATORY_CARE_PROVIDER_SITE_OTHER)

## 2017-07-10 DIAGNOSIS — M779 Enthesopathy, unspecified: Secondary | ICD-10-CM

## 2017-07-10 DIAGNOSIS — M722 Plantar fascial fibromatosis: Secondary | ICD-10-CM

## 2017-07-10 NOTE — Patient Instructions (Signed)
Peroneal Tendinopathy Rehab  Ask your health care provider which exercises are safe for you. Do exercises exactly as told by your health care provider and adjust them as directed. It is normal to feel mild stretching, pulling, tightness, or discomfort as you do these exercises, but you should stop right away if you feel sudden pain or your pain gets worse.Do not begin these exercises until told by your health care provider.  Stretching and range of motion exercises  These exercises warm up your muscles and joints and improve the movement and flexibility of your ankle. These exercises also help to relieve pain and stiffness.  Exercise A: Gastroc and soleus, standing  1. Stand on the edge of a step on the balls of your feet. The ball of your foot is on the walking surface, right under your toes.  2. Hold onto the railing for balance.  3. Slowly lift your left / right foot, allowing your body weight to press your left / right heel down over the edge of the step. You should feel a stretch in your left / right calf.  4. Hold this position for __________ seconds.  Repeat __________ times with your left / right knee straight and __________ times with your left / right knee bent. Complete this stretch __________ times per day.  Strengthening exercises  These exercises improve the strength and endurance of your foot and ankle. Endurance is the ability to use your muscles for a long time, even after they get tired.  Exercise B: Dorsiflexors    1. Secure a rubber exercise band or tube to an object, like a table leg, that will not move if it is pulled on.  2. Secure the other end of the band around your left / right foot.  3. Sit on the floor, facing the object with your left / right foot extended. The band or tube should be slightly tense when your foot is relaxed.  4. Slowly flex your left / right ankle and toes to bring your foot toward you.  5. Hold this position for __________ seconds.  6. Slowly return your foot to the  starting position.  Repeat __________ times. Complete this exercise __________ times per day.  Exercise C: Evertors  1. Sit on the floor with your legs straight out in front of you.  2. Loop a rubber exercise or band or tube around the ball of your left / right foot. The ball of your foot is on the walking surface, right under your toes.  3. Hold the ends of the band in your hands, or secure the band to a stable object.  4. Slowly push your foot outward, away from your other leg.  5. Hold this position for __________ seconds.  6. Slowly return your foot to the starting position.  Repeat __________ times. Complete this exercise __________ times per day.  Exercise D: Standing heel raise (  plantar flexion)  1. Stand with your feet shoulder-width apart with the balls of your feet on a step. The ball of your foot is on the walking surface, right under your toes.  2. Keep your weight spread evenly over the width of your feet while you rise up on your toes. Use a wall or railing to steady yourself, but try not to use it for support.  3. If this exercise is too easy, try these options:  ? Shift your weight toward your left / right leg until you feel challenged.  ? If told by   your health care provider, stand on your left / right leg only.  4. Hold this position for __________ seconds.  Repeat __________ times. Complete this exercise __________ times per day.  Exercise E: Single leg stand  1. Without shoes, stand near a railing or in a doorway. You may hold onto the railing or door frame as needed.  2. Stand on your left / right foot. Keep your big toe down on the floor and try to keep your arch lifted.  ? Do not roll to the outside of your foot.  ? If this exercise is too easy, you can try it with your eyes closed or while standing on a pillow.  3. Hold this position for __________ seconds.  Repeat __________ times. Complete this exercise __________ times per day.  This information is not intended to replace advice given to  you by your health care provider. Make sure you discuss any questions you have with your health care provider.  Document Released: 03/31/2005 Document Revised: 12/06/2015 Document Reviewed: 02/17/2015  Elsevier Interactive Patient Education  2018 [employer]

## 2017-07-10 NOTE — Telephone Encounter (Signed)
Tried to contact pt to give him message from [facility] about his Handicap Parking application. See previous note.  No answer, pt's voicemail box not set up.

## 2017-07-10 NOTE — Telephone Encounter (Signed)
Patient called back, pec has informed patient of what pcp has said regarding handicap placard.

## 2017-07-12 NOTE — Progress Notes (Signed)
Subjective: 52 year old male presents the office today for concerns of pain to the outside aspect the right foot as well as the left bottom of the heel.  He states that the pain is intermittent in nature not a daily basis.  He states that hurts about 2-3 times a week. He denies any recent injury or trauma to his feet.  He denies any numbness or tingling.  The pain does not wake him up at night.  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  He states he has pain when he first gets going in the morning at times.  He said no recent treatment.  He has no other concerns. Denies any systemic complaints such as fevers, chills, nausea, vomiting. No acute changes since last appointment, and no other complaints at this time.   Objective: AAO x3, NAD DP/PT pulses palpable bilaterally, CRT less than 3 seconds There is tenderness palpation along the plantar medial tubercle of the calcaneus at the insertion of the plantar fascia on the left foot.  Plantar fascia appears to be intact.  Achilles tendon appears to be intact.  No pain with lateral compression of the calcaneus.  No overlying edema, erythema, increase in warmth bilaterally.  There is tenderness in the right foot along the fifth metatarsal base along the insertion of the peroneal tendon.  There is no pain directly on the fifth metatarsal base there is no pain to vibratory sensation.  The pain is along the distal portion of the peroneal tendon. No open lesions or pre-ulcerative lesions.  No pain with calf compression, swelling, warmth, erythema  Assessment: 52 year old male with insertional peroneal tendinitis right foot with left heel pain, plantar fasciitis.  Plan: -All treatment options discussed with the patient including all alternatives, risks, complications.  -X-rays were obtained and reviewed.  There is no definitive evidence of acute fracture or stress fracture identified today. -Discussed a steroid  injection he wishes to proceed with this.  See procedure notes below. -He would likely benefit more from a custom molded orthotic.  I will have him come back in to see Liliane Channel to get molded for inserts.  We will check insurance coverage. -Bilateral plantar fascial braces were dispensed today. -Ice to the area daily.  Also discussed stretching exercises. -Patient encouraged to call the office with any questions, concerns, change in symptoms.   Procedure: Injection Tendon/Ligament Discussed alternatives, risks, complications and verbal consent was obtained.  Location: Left plantar fascia at the glabrous junction; medial approach. Skin Prep: Alcohol. Injectate: 0.5 cc 0.5% marcaine plain, 0.5 cc 0.5% Marcaine plain and, 1 cc kenalog 10. Disposition: Patient tolerated procedure well. Injection site dressed with a band-aid.  Post-injection care was discussed and return precautions discussed.   Procedure: Injection Tendon/Ligament Discussed alternatives, risks, complications and verbal consent was obtained.  Location: Right fifth metatarsal base, peroneal tendon sheath Skin Prep: Alcohol. Injectate: 0.5 cc Marcaine plain and 0.5 cc dexamethasone phospate.  Disposition: Patient tolerated procedure well. Injection site dressed with a band-aid.  Post-injection care was discussed and return precautions discussed.   Trula Slade DPM

## 2017-07-14 ENCOUNTER — Telehealth: Payer: Self-pay | Admitting: Podiatry

## 2017-07-14 NOTE — Telephone Encounter (Signed)
Called pt to let him know [employer] would not cover orthotics in our office. And I would get Dr Viona Gilmore to write rx and fax to [facility] prosthetic dept.

## 2017-07-15 ENCOUNTER — Other Ambulatory Visit: Payer: Self-pay | Admitting: Podiatry

## 2017-07-15 DIAGNOSIS — M779 Enthesopathy, unspecified: Secondary | ICD-10-CM

## 2017-07-15 DIAGNOSIS — M722 Plantar fascial fibromatosis: Secondary | ICD-10-CM

## 2017-07-15 NOTE — Telephone Encounter (Signed)
Faxed office note and rx to [facility] attention Jeneen Rinks

## 2017-07-15 NOTE — Progress Notes (Signed)
Rx for orthotics written

## 2017-07-16 ENCOUNTER — Other Ambulatory Visit: Admitting: Orthotics

## 2017-07-17 ENCOUNTER — Encounter: Payer: Self-pay | Admitting: Family

## 2017-07-17 ENCOUNTER — Ambulatory Visit (INDEPENDENT_AMBULATORY_CARE_PROVIDER_SITE_OTHER): Admitting: Family

## 2017-07-17 VITALS — BP 173/93 | HR 61 | Resp 16 | Ht 73.0 in | Wt 268.0 lb

## 2017-07-17 DIAGNOSIS — I10 Essential (primary) hypertension: Secondary | ICD-10-CM | POA: Diagnosis not present

## 2017-07-17 MED ORDER — LOSARTAN POTASSIUM 50 MG PO TABS: 50.0000 mg | ORAL_TABLET | Freq: Every day | ORAL | 5 refills | Status: DC

## 2017-07-17 MED ORDER — AMLODIPINE BESYLATE 5 MG PO TABS: 5.0000 mg | ORAL_TABLET | Freq: Every day | ORAL | 3 refills | Status: DC

## 2017-07-17 NOTE — Patient Instructions (Signed)
Add amlodipine 5mg  once daily for blood pressure.

## 2017-07-17 NOTE — Progress Notes (Signed)
Subjective:    Patient ID: Charles Courier., male    DOB: 1965/08/24, 52 y.o.   MRN: [id number]  HPI  Charles Nash is a 52 yr old male who presents today for follow up of his hypertension.  He was last seen in early February.  At that time his blood pressure was elevated.  We increased his Toprol-XL from 50 mg to 100 mg once daily.  He is also maintained on hydralazine 50 mg 3 times daily, and losartan 50 mg once daily.  BP Readings from Last 3 Encounters:  07/17/17 (!) 173/93  06/25/17 130/80  06/03/17 134/87      Review of Systems See HPI  Past Medical History:  Diagnosis Date  . ADHD (attention deficit hyperactivity disorder)   . Allergy   . Arthritis   . Bilateral varicoceles   . Depression   . Fatty liver 08/06/2011  . Genital herpes 10/25/2014  . GERD (gastroesophageal reflux disease)   . Hyperlipidemia   . Hypertension   . OSA (obstructive sleep apnea)   . Personal history of colonic polyps   . Plantar fasciitis   . PTSD (post-traumatic stress disorder)   . Sleep apnea      Social History   Socioeconomic History  . Marital status: Divorced    Spouse name: Not on file  . Number of children: 2  . Years of education: Not on file  . Highest education level: Not on file  Occupational History  . Occupation: Astronomer: [employer]  Social Needs  . Financial resource strain: Not on file  . Food insecurity:    Worry: Not on file    Inability: Not on file  . Transportation needs:    Medical: Not on file    Non-medical: Not on file  Tobacco Use  . Smoking status: Never Smoker  . Smokeless tobacco: Never Used  Substance and Sexual Activity  . Alcohol use: No    Alcohol/week: 0.0 oz  . Drug use: No  . Sexual activity: Not on file  Lifestyle  . Physical activity:    Days per week: Not on file    Minutes per session: Not on file  . Stress: Not on file  Relationships  . Social connections:    Talks on phone: Not on file    Gets  together: Not on file    Attends religious service: Not on file    Active member of club or organization: Not on file    Attends meetings of clubs or organizations: Not on file    Relationship status: Not on file  . Intimate partner violence:    Fear of current or ex partner: Not on file    Emotionally abused: Not on file    Physically abused: Not on file    Forced sexual activity: Not on file  Other Topics Concern  . Not on file  Social History Narrative   Regular exercise:  No   Caffeine use:  2--32oz cups daily       Past Surgical History:  Procedure Laterality Date  . ANKLE SURGERY    . DOPPLER ECHOCARDIOGRAPHY  2010  . FOOT SURGERY    . KNEE SURGERY  08/04/08   Right knee-- medial meniscus repair, attenuation anterior cruciate [name] [facility])   . NM MYOVIEW LTD  2010  . PLANTAR FASCIA RELEASE    . SHOULDER SURGERY    . SLEEVE GASTROPLASTY  10/29/2016  .  VARICOCELE EXCISION      Family History  Problem Relation Age of Onset  . Diabetes Mother   . Hypertension Mother   . Arthritis Mother   . Cancer Mother        uterine and breast cancer, sarcoma of the brain  . Hyperlipidemia Father   . Arthritis Father   . Cancer Father        thyroid, prostate cancer  . Other Father        mass in stomach  . Hyperlipidemia Maternal Grandmother   . Hypertension Maternal Grandmother   . Hyperlipidemia Maternal Grandfather   . Hypertension Maternal Grandfather   . Hyperlipidemia Paternal Grandmother   . Hypertension Paternal Grandmother   . Hyperlipidemia Paternal Grandfather   . Hypertension Paternal Grandfather   . Heart attack Paternal Grandfather   . Sudden death Neg Hx   . Colon cancer Neg Hx   . Esophageal cancer Neg Hx   . Stomach cancer Neg Hx   . Rectal cancer Neg Hx     Allergies  Allergen Reactions  . Oxycodone-Acetaminophen Hives and Itching  . Zoloft [Sertraline Hcl]     ?weight gain. "made me feel weird"    Current Outpatient  Medications on File Prior to Visit  Medication Sig Dispense Refill  . atorvastatin (LIPITOR) 10 MG tablet Take 1 tablet (10 mg total) by mouth daily. 14 tablet 0  . Blood Glucose Monitoring Suppl (ACCU-CHEK GUIDE) w/Device KIT 1 each by Does not apply route daily. 1 kit 0  . buPROPion (WELLBUTRIN XL) 300 MG 24 hr tablet Take 1 tablet (300 mg total) by mouth daily. (Patient taking differently: Take 150 mg by mouth daily. )    . CALCIUM-MAGNESIUM-VITAMIN D PO Take 1 tablet by mouth 3 (three) times daily.    . Cholecalciferol (VITAMIN D3) 5000 UNIT/ML LIQD Take 5,000 Units by mouth daily.    . Clobetasol Propionate (CLOBEX) 0.05 % shampoo Apply thin film to dry scalp once daily; leave in place for 15 minutes, then add water, lather, and rinse thoroughly 118 mL 0  . clomiPHENE (CLOMID) 50 MG tablet     . Cobalamine Combinations (Z-16 + FOLIC ACID PO) Take by mouth. 1/2 dropper ful once a day.    . fexofenadine (ALLEGRA) 180 MG tablet Take 1 tablet (180 mg total) by mouth daily as needed for allergies or rhinitis. 30 tablet 2  . fluticasone (FLONASE) 50 MCG/ACT nasal spray Place 2 sprays into both nostrils daily. 16 g 6  . glucose blood (ACCU-CHEK GUIDE) test strip Use as instructed 100 each 1  . hydrALAZINE (APRESOLINE) 50 MG tablet Take 1 tablet (50 mg total) by mouth 3 (three) times daily. 180 tablet 0  . hydrocortisone (ANUSOL-HC) 2.5 % rectal cream Apply rectally 2 times daily 28 g 0  . KLOR-CON M20 20 MEQ tablet TAKE 1 TABLET DAILY 90 tablet 1  . Lancets (FREESTYLE) lancets USE TO CHECK BLOOD SUGAR ONCE DAILY 100 each 1  . loperamide (IMODIUM) 2 MG capsule Take 1 capsule (2 mg total) by mouth 4 (four) times daily as needed for diarrhea or loose stools. 12 capsule 0  . losartan (COZAAR) 50 MG tablet Take 1 tablet (50 mg total) by mouth daily. 30 tablet 5  . metoprolol succinate (TOPROL XL) 100 MG 24 hr tablet Take 1 tablet (100 mg total) by mouth daily. Take with or immediately following a meal.  90 tablet 1  . montelukast (SINGULAIR) 10 MG tablet Take 1 tablet (  10 mg total) by mouth at bedtime. (Patient taking differently: Take 10 mg by mouth at bedtime as needed. ) 30 tablet 3  . Multiple Vitamin (MULTI-VITAMINS) TABS Take 1 tablet by mouth daily.    Marland Kitchen omeprazole (PRILOSEC) 20 MG capsule TAKE 2 CAPSULES EVERY MORNING 180 capsule 1  . ondansetron (ZOFRAN ODT) 4 MG disintegrating tablet Take 1 tablet (4 mg total) by mouth every 8 (eight) hours as needed for nausea or vomiting. 20 tablet 0  . polyethylene glycol (MIRALAX / GLYCOLAX) packet     . PROAIR HFA 108 (90 Base) MCG/ACT inhaler Inhale 2 puffs into the lungs 4 (four) times daily as needed. 1 Inhaler 0  . Pyridoxine HCl (B-6 PO) Take 1 tablet by mouth daily.    . QUEtiapine (SEROQUEL) 25 MG tablet Take 1 tablet by mouth at bedtime.    . sildenafil (VIAGRA) 100 MG tablet TAKE 1/2 TO 1 TABLET BY MOUTH DAILY AS NEEDED 15 tablet 1  . triamcinolone ointment (KENALOG) 0.1 % Apply to red itchy areas only below the face 80 g 3  . vitamin C (ASCORBIC ACID) 500 MG tablet Take 500 mg by mouth daily.    Marland Kitchen VYVANSE 20 MG capsule     . [DISCONTINUED] potassium chloride (K-DUR) 10 MEQ tablet Take 1 tablet (10 mEq total) by mouth daily. 30 tablet 1   Current Facility-Administered Medications on File Prior to Visit  Medication Dose Route Frequency Provider Last Rate Last Dose  . 0.9 %  sodium chloride infusion  500 mL Intravenous Continuous Ladene Artist, MD        BP (!) 173/93 (BP Location: Left Arm, Patient Position: Sitting, Cuff Size: Large)   Pulse 61   Resp 16   Ht '6\' 1"'$  (1.854 m)   Wt 268 lb (121.6 kg)   SpO2 100%   BMI 35.36 kg/m       Objective:   Physical Exam  Constitutional: He is oriented to person, place, and time. He appears well-developed and well-nourished. No distress.  HENT:  Head: Normocephalic and atraumatic.  Cardiovascular: Normal rate and regular rhythm.  No murmur heard. Pulmonary/Chest: Effort normal and  breath sounds normal. No respiratory distress. He has no wheezes. He has no rales.  Musculoskeletal: He exhibits no edema.  Neurological: He is alert and oriented to person, place, and time.  Skin: Skin is warm and dry.  Psychiatric: He has a normal mood and affect. His behavior is normal. Thought content normal.          Assessment & Plan:  HTN- uncontrolled. Add amlodipine, follow up in 2 weeks.

## 2017-07-29 DIAGNOSIS — H903 Sensorineural hearing loss, bilateral: Secondary | ICD-10-CM | POA: Insufficient documentation

## 2017-08-03 ENCOUNTER — Ambulatory Visit: Payer: Self-pay | Admitting: Family

## 2017-08-03 DIAGNOSIS — Z0289 Encounter for other administrative examinations: Secondary | ICD-10-CM

## 2017-08-06 ENCOUNTER — Encounter: Payer: Self-pay | Admitting: Family

## 2017-08-06 MED ORDER — QUETIAPINE FUMARATE 25 MG PO TABS: 25.0000 mg | ORAL_TABLET | Freq: Every day | ORAL | 0 refills | Status: DC

## 2017-08-06 NOTE — Addendum Note (Signed)
Addended by: Debbrah Alar on: 08/06/2017 12:24 PM   Modules accepted: Orders

## 2017-08-22 ENCOUNTER — Other Ambulatory Visit: Payer: Self-pay | Admitting: Family

## 2017-08-27 ENCOUNTER — Telehealth: Payer: Self-pay

## 2017-08-27 NOTE — Telephone Encounter (Signed)
Patient came in at 7:20 am to see Charles Nash However his appointment is on Monday. Patient needed note stating that he came in ot the office but was not seen, he needed not for work.

## 2017-08-31 ENCOUNTER — Ambulatory Visit: Admitting: Family

## 2017-09-17 ENCOUNTER — Encounter: Payer: Self-pay | Admitting: Physical Medicine & Rehabilitation

## 2017-09-18 ENCOUNTER — Encounter: Attending: Physical Medicine & Rehabilitation

## 2017-09-18 ENCOUNTER — Ambulatory Visit (HOSPITAL_BASED_OUTPATIENT_CLINIC_OR_DEPARTMENT_OTHER): Admitting: Physical Medicine & Rehabilitation

## 2017-09-18 ENCOUNTER — Other Ambulatory Visit: Payer: Self-pay

## 2017-09-18 ENCOUNTER — Encounter: Payer: Self-pay | Admitting: Physical Medicine & Rehabilitation

## 2017-09-18 VITALS — BP 147/90 | HR 61 | Ht 73.0 in | Wt 274.4 lb

## 2017-09-18 DIAGNOSIS — M533 Sacrococcygeal disorders, not elsewhere classified: Secondary | ICD-10-CM

## 2017-09-18 DIAGNOSIS — M7918 Myalgia, other site: Secondary | ICD-10-CM | POA: Diagnosis not present

## 2017-09-18 DIAGNOSIS — M545 Low back pain: Secondary | ICD-10-CM | POA: Insufficient documentation

## 2017-09-18 DIAGNOSIS — M47816 Spondylosis without myelopathy or radiculopathy, lumbar region: Secondary | ICD-10-CM

## 2017-09-18 NOTE — Patient Instructions (Signed)
Right sacroiliac injectionSacroiliac Joint Dysfunction Sacroiliac joint dysfunction is a condition that causes inflammation on one or both sides of the sacroiliac (SI) joint. The SI joint connects the lower part of the spine (sacrum) with the two upper portions of the pelvis (ilium). This condition causes deep aching or burning pain in the low back. In some cases, the pain may also spread into one or both buttocks or hips or spread down the legs. What are the causes? This condition may be caused by:  Pregnancy. During pregnancy, extra stress is put on the SI joints because the pelvis widens.  Injury, such as: ? Car accidents. ? Sport-related injuries. ? Work-related injuries.  Having one leg that is shorter than the other.  Conditions that affect the joints, such as: ? Rheumatoid arthritis. ? Gout. ? Psoriatic arthritis. ? Joint infection (septic arthritis).  Sometimes, the cause of SI joint dysfunction is not known. What are the signs or symptoms? Symptoms of this condition include:  Aching or burning pain in the lower back. The pain may also spread to other areas, such as: ? Buttocks. ? Groin. ? Thighs and legs.  Muscle spasms in or around the painful areas.  Increased pain when standing, walking, running, stair climbing, bending, or lifting.  How is this diagnosed? Your health care provider will do a physical exam and take your medical history. During the exam, the health care provider may move one or both of your legs to different positions to check for pain. Various tests may be done to help verify the diagnosis, including:  Imaging tests to look for other causes of pain. These may include: ? MRI. ? CT scan. ? Bone scan.  Diagnostic injection. A numbing medicine is injected into the SI joint using a needle. If the pain is temporarily improved or stopped after the injection, this can indicate that SI joint dysfunction is the problem.  How is this treated? Treatment may  vary depending on the cause and severity of your condition. Treatment options may include:  Applying ice or heat to the lower back area. This can help to reduce pain and muscle spasms.  Medicines to relieve pain or inflammation or to relax the muscles.  Wearing a back brace (sacroiliac brace) to help support the joint while your back is healing.  Physical therapy to increase muscle strength around the joint and flexibility at the joint. This may also involve learning proper body positions and ways of moving to relieve stress on the joint.  Direct manipulation of the SI joint.  Injections of steroid medicine into the joint in order to reduce pain and swelling.  Radiofrequency ablation to burn away nerves that are carrying pain messages from the joint.  Use of a device that provides electrical stimulation in order to reduce pain at the joint.  Surgery to put in screws and plates that limit or prevent joint motion. This is rare.  Follow these instructions at home:  Rest as needed. Limit your activities as directed by your health care provider.  Take medicines only as directed by your health care provider.  If directed, apply ice to the affected area: ? Put ice in a plastic bag. ? Place a towel between your skin and the bag. ? Leave the ice on for 20 minutes, 2-3 times per day.  Use a heating pad or a moist heat pack as directed by your health care provider.  Exercise as directed by your health care provider or physical therapist.  Keep all  follow-up visits as directed by your health care provider. This is important. Contact a health care provider if:  Your pain is not controlled with medicine.  You have a fever.  You have increasingly severe pain. Get help right away if:  You have weakness, numbness, or tingling in your legs or feet.  You lose control of your bladder or bowel. This information is not intended to replace advice given to you by your health care provider.  Make sure you discuss any questions you have with your health care provider. Document Released: 06/27/2008 Document Revised: 09/06/2015 Document Reviewed: 12/06/2013 Elsevier Interactive Patient Education  [employer].

## 2017-09-18 NOTE — Progress Notes (Signed)
Subjective:    Patient ID: Charles Nash., male    DOB: Oct 01, 1965, 52 y.o.   MRN: [id number]  HPI CC Right sight sided low back pain  Onset ~ 1.5 mo ago went to [place] and tried lidocaine ointment. Responded to massage. 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 General activity 7 Relation with others 0 Enjoyment of life 0 What TIME of day is your pain at its worst? all Sleep (in general) Good  Pain is worse with: sitting and some activites Pain improves with: injections Relief from Meds: 8  Mobility ability to climb steps?  yes do you drive?  yes  Function employed # of hrs/week 32  Neuro/Psych spasms anxiety  Prior Studies Any changes since last visit?  no  Physicians involved in your care Any changes since last visit?  no   Family History  Problem Relation Age of Onset  . Diabetes Mother   . Hypertension Mother   . Arthritis Mother   . Cancer Mother        uterine and breast cancer, sarcoma of the brain  . Hyperlipidemia Father   . Arthritis Father   . Cancer Father        thyroid, prostate cancer  . Other Father        mass in stomach  . Hyperlipidemia Maternal Grandmother   . Hypertension Maternal Grandmother   . Hyperlipidemia Maternal Grandfather   . Hypertension Maternal Grandfather   . Hyperlipidemia Paternal Grandmother   .  Hypertension Paternal Grandmother   . Hyperlipidemia Paternal Grandfather   . Hypertension Paternal Grandfather   . Heart attack Paternal Grandfather   . Sudden death Neg Hx   . Colon cancer Neg Hx   . Esophageal cancer Neg Hx   . Stomach cancer Neg Hx   . Rectal cancer Neg Hx    Social History   Socioeconomic History  . Marital status: Divorced    Spouse name: Not on file  . Number of children: 2  . Years of education: Not on file  . Highest education level: Not on file  Occupational History  . Occupation: Astronomer: [employer]  Social Needs  . Financial resource strain: Not on file  . Food insecurity:    Worry: Not on file    Inability: Not on file  . Transportation needs:    Medical: Not on file    Non-medical: Not on file  Tobacco Use  . Smoking status: Never Smoker  . Smokeless tobacco: Never Used  Substance and Sexual Activity  . Alcohol use: No    Alcohol/week: 0.0 oz  . Drug use: No  . Sexual activity: Not on file  Lifestyle  . Physical activity:    Days per week: Not on file    Minutes per session: Not on file  . Stress: Not on file  Relationships  .  Social connections:    Talks on phone: Not on file    Gets together: Not on file    Attends religious service: Not on file    Active member of club or organization: Not on file    Attends meetings of clubs or organizations: Not on file    Relationship status: Not on file  Other Topics Concern  . Not on file  Social History Narrative   Regular exercise:  No   Caffeine use:  2--32oz cups daily      Past Surgical History:  Procedure Laterality Date  . ANKLE SURGERY    . DOPPLER ECHOCARDIOGRAPHY  2010  . FOOT SURGERY    . KNEE SURGERY  08/04/08   Right knee-- medial meniscus repair, attenuation anterior cruciate [name] MD [facility])   . NM MYOVIEW LTD  2010  . PLANTAR FASCIA RELEASE    . SHOULDER SURGERY    . SLEEVE GASTROPLASTY  10/29/2016  . VARICOCELE  EXCISION     Past Medical History:  Diagnosis Date  . ADHD (attention deficit hyperactivity disorder)   . Allergy   . Arthritis   . Bilateral varicoceles   . Depression   . Fatty liver 08/06/2011  . Genital herpes 10/25/2014  . GERD (gastroesophageal reflux disease)   . Hyperlipidemia   . Hypertension   . OSA (obstructive sleep apnea)   . Personal history of colonic polyps   . Plantar fasciitis   . PTSD (post-traumatic stress disorder)   . Sleep apnea    BP (!) 147/90   Pulse 61   Ht 6\' 1"  (1.854 m)   Wt 274 lb 6.4 oz (124.5 kg)   SpO2 97%   BMI 36.20 kg/m   Opioid Risk Score:   Fall Risk Score:  `1  Depression screen PHQ 2/9  Depression screen [facility] 2/9 09/18/2017 07/23/2016 01/01/2016 07/23/2015 06/25/2015 06/04/2015 05/15/2015  Decreased Interest 0 2 0 0 1 1 1   Down, Depressed, Hopeless 0 0 0 0 1 1 1   PHQ - 2 Score 0 2 0 0 2 2 2   Altered sleeping - 3 - - - - 3  Tired, decreased energy - 3 - - - - 3  Change in appetite - 3 - - - - 3  Feeling bad or failure about yourself  - 0 - - - - 0  Trouble concentrating - 3 - - - - 3  Moving slowly or fidgety/restless - 0 - - - - 0  Suicidal thoughts - 0 - - - - 0  PHQ-9 Score - 14 - - - - 14  Difficult doing work/chores - - - - - - Extremely dIfficult  Some recent data might be hidden    Review of Systems  Constitutional: Negative.   HENT: Negative.   Eyes: Negative.   Respiratory: Positive for apnea.   Cardiovascular: Negative.   Gastrointestinal: Negative.   Endocrine: Negative.   Genitourinary: Negative.   Musculoskeletal: Negative.   Skin: Negative.   Allergic/Immunologic: Negative.   Neurological: Negative.   Hematological: Negative.   Psychiatric/Behavioral: Negative.   All other systems reviewed and are negative.      Objective:   Physical Exam  Mood and affect are appropriate Motor strength is 5/5 bilateral hip flexor knee extensor ankle dorsiflexor Sensation intact to pinprick bilateral L2-L3-L4 L5-S1  dermatome distribution Deep tendon reflexes are 0 bilateral ankles 1+ bilateral knees Gait is without evidence of toe drag or knee instability Lumbar spine range of motion  is percent forward flexion 50% extension 50% lateral bending.  Patient has pain in the lumbar area with extension as well as with lateral bending both to the left side and to the right side. There is tenderness palpation lumbar paraspinals but most particularly in the right PSIS area Negative Faber's test bilaterally.  Good hip range of motion     Assessment & Plan:  1.  Acute exacerbation of chronic low back pain mainly right-sided.  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  His pain has not responded to conservative care including medications and massage.  I have asked the patient to try some Tylenol over-the-counter as well.  We will schedule for right sacroiliac injection under fluoroscopic guidance.  Next available

## 2017-09-28 ENCOUNTER — Encounter: Payer: Self-pay | Admitting: Physical Medicine & Rehabilitation

## 2017-09-28 ENCOUNTER — Ambulatory Visit (HOSPITAL_BASED_OUTPATIENT_CLINIC_OR_DEPARTMENT_OTHER): Admitting: Physical Medicine & Rehabilitation

## 2017-09-28 ENCOUNTER — Encounter

## 2017-09-28 VITALS — BP 138/86 | HR 67 | Ht 73.0 in | Wt 277.0 lb

## 2017-09-28 DIAGNOSIS — M545 Low back pain: Secondary | ICD-10-CM | POA: Diagnosis not present

## 2017-09-28 DIAGNOSIS — M533 Sacrococcygeal disorders, not elsewhere classified: Secondary | ICD-10-CM

## 2017-09-28 NOTE — Progress Notes (Signed)

## 2017-09-28 NOTE — Progress Notes (Signed)
  PROCEDURE RECORD [facility]   Name: Charles Nash. DOB:03-21-1966 MRN: [id number]  Date:09/28/2017  Physician: Alysia Penna, MD    Nurse/CMA: Metha Kolasa CMA   Allergies:  Allergies  Allergen Reactions  . Oxycodone-Acetaminophen Hives and Itching  . Zoloft [Sertraline Hcl]     ?weight gain. "made me feel weird"    Consent Signed: Yes.    Is patient diabetic? No.  CBG today? NA  Pregnant: No. LMP: No LMP for male patient. (age 28-55)  Anticoagulants: no Anti-inflammatory: no Antibiotics: no  Procedure: Right Sacroiliac Injection  Position: Prone   Start Time: 1048am End Time: 1051am  Fluoro Time: 15s  RN/CMA Banks Chaikin CMA Artin Mceuen CMA    Time 1028am 1057am    BP 138/86 147/82    Pulse 67 63    Respirations 16 16    O2 Sat 97 97    S/S 6 6    Pain Level 4/10 1/10     D/C home with Self, patient A & O X 3, D/C instructions reviewed, and sits independently.

## 2017-09-28 NOTE — Patient Instructions (Signed)
Sacroiliac injection was performed today. A combination of a numbing medicine plus a cortisone medicine was injected. The injection was done under x-ray guidance with contrast enhancement. This procedure has been performed to help reduce low back and buttocks pain as well as potentially hip pain. The duration of this injection is variable lasting from hours to  Months. It may repeated if needed. 

## 2017-11-03 ENCOUNTER — Encounter: Payer: Self-pay | Admitting: Family

## 2017-11-05 ENCOUNTER — Encounter: Payer: Self-pay | Admitting: Family Medicine

## 2017-11-05 ENCOUNTER — Ambulatory Visit (INDEPENDENT_AMBULATORY_CARE_PROVIDER_SITE_OTHER): Admitting: Family Medicine

## 2017-11-05 VITALS — BP 140/96 | HR 72 | Temp 98.2°F | Resp 16 | Ht 73.0 in | Wt 280.4 lb

## 2017-11-05 DIAGNOSIS — K5900 Constipation, unspecified: Secondary | ICD-10-CM

## 2017-11-05 DIAGNOSIS — K644 Residual hemorrhoidal skin tags: Secondary | ICD-10-CM

## 2017-11-05 MED ORDER — MUPIROCIN 2 % EX OINT: 1.0000 "application " | TOPICAL_OINTMENT | Freq: Two times a day (BID) | CUTANEOUS | 0 refills | Status: DC

## 2017-11-05 MED FILL — MUPIROCIN 2% OINTMENT: 2 | 10 days supply | Qty: 22 | Fill #0

## 2017-11-05 NOTE — Patient Instructions (Addendum)
You may have turned the corner. If you still don't turn the corner, I think trying an enema will be in your best interest. Stay well hydrated and keep lots of fiber in your diet.   Hemorrhoids Hemorrhoids are swollen veins in and around the rectum or anus. There are two types of hemorrhoids:  Internal hemorrhoids. These occur in the veins that are just inside the rectum. They may poke through to the outside and become irritated and painful.  External hemorrhoids. These occur in the veins that are outside of the anus and can be felt as a painful swelling or hard lump near the anus.  Most hemorrhoids do not cause serious problems, and they can be managed with home treatments such as diet and lifestyle changes. If home treatments do not help your symptoms, procedures can be done to shrink or remove the hemorrhoids. What are the causes? This condition is caused by increased pressure in the anal area. This pressure may result from various things, including:  Constipation.  Straining to have a bowel movement.  Diarrhea.  Pregnancy.  Obesity.  Sitting for long periods of time.  Heavy lifting or other activity that causes you to strain.  Anal sex.  What are the signs or symptoms? Symptoms of this condition include:  Pain.  Anal itching or irritation.  Rectal bleeding.  Leakage of stool (feces).  Anal swelling.  One or more lumps around the anus.  How is this diagnosed? This condition can often be diagnosed through a visual exam. Other exams or tests may also be done, such as:  Examination of the rectal area with a gloved hand (digital rectal exam).  Examination of the anal canal using a small tube (anoscope).  A blood test, if you have lost a significant amount of blood.  A test to look inside the colon (sigmoidoscopy or colonoscopy).  How is this treated? This condition can usually be treated at home. However, various procedures may be done if dietary changes,  lifestyle changes, and other home treatments do not help your symptoms. These procedures can help make the hemorrhoids smaller or remove them completely. Some of these procedures involve surgery, and others do not. Common procedures include:  Rubber band ligation. Rubber bands are placed at the base of the hemorrhoids to cut off the blood supply to them.  Sclerotherapy. Medicine is injected into the hemorrhoids to shrink them.  Infrared coagulation. A type of light energy is used to get rid of the hemorrhoids.  Hemorrhoidectomy surgery. The hemorrhoids are surgically removed, and the veins that supply them are tied off.  Stapled hemorrhoidopexy surgery. A circular stapling device is used to remove the hemorrhoids and use staples to cut off the blood supply to them.  Follow these instructions at home: Eating and drinking  Eat foods that have a lot of fiber in them, such as whole grains, beans, nuts, fruits, and vegetables. Ask your health care provider about taking products that have added fiber (fiber supplements).  Drink enough fluid to keep your urine clear or pale yellow. Managing pain and swelling  Take warm sitz baths for 20 minutes, 3-4 times a day to ease pain and discomfort.  If directed, apply ice to the affected area. Using ice packs between sitz baths may be helpful. ? Put ice in a plastic bag. ? Place a towel between your skin and the bag. ? Leave the ice on for 20 minutes, 2-3 times a day. General instructions  Take over-the-counter and prescription medicines  only as told by your health care provider.  Use medicated creams or suppositories as told.  Exercise regularly.  Go to the bathroom when you have the urge to have a bowel movement. Do not wait.  Avoid straining to have bowel movements.  Keep the anal area dry and clean. Use wet toilet paper or moist towelettes after a bowel movement.  Do not sit on the toilet for long periods of time. This increases blood  pooling and pain. Contact a health care provider if:  You have increasing pain and swelling that are not controlled by treatment or medicine.  You have uncontrolled bleeding.  You have difficulty having a bowel movement, or you are unable to have a bowel movement.  You have pain or inflammation outside the area of the hemorrhoids. This information is not intended to replace advice given to you by your health care provider. Make sure you discuss any questions you have with your health care provider. Document Released: 03/28/2000 Document Revised: 08/29/2015 Document Reviewed: 12/13/2014 Elsevier Interactive Patient Education  [employer].

## 2017-11-05 NOTE — Progress Notes (Signed)
Chief Complaint  Patient presents with  . Constipation    off and on for a month, worsened overthe last 4 days, blood in stool,using miralax, LBM this morning  . Nose Sore    uses cpap machine, developed sore in nostril, would like to know what nasal spray to use to heal sore    Subjective: Patient is a 52 y.o. male here for constipation.  He has been straining with bowel movements over past 4 d. Had a BM this morning, is passing gas. No N/V. Using MiraLax. No rectal pain or itching. +hx of hemorrhoids.   ROS: GI: As noted in HPI  Past Medical History:  Diagnosis Date  . ADHD (attention deficit hyperactivity disorder)   . Allergy   . Arthritis   . Bilateral varicoceles   . Depression   . Fatty liver 08/06/2011  . Genital herpes 10/25/2014  . GERD (gastroesophageal reflux disease)   . Hyperlipidemia   . Hypertension   . OSA (obstructive sleep apnea)   . Personal history of colonic polyps   . Plantar fasciitis   . PTSD (post-traumatic stress disorder)   . Sleep apnea     Objective: BP (!) 140/96 (BP Location: Left Arm, Patient Position: Sitting, Cuff Size: Large)   Pulse 72   Temp 98.2 F (36.8 C) (Oral)   Resp 16   Ht 6\' 1"  (1.854 m)   Wt 280 lb 6.4 oz (127.2 kg)   SpO2 97%   BMI 36.99 kg/m  General: Awake, appears stated age HEENT: MMM, EOMi Heart: RRR, no murmurs Lungs: CTAB, no rales, wheezes or rhonchi. No accessory muscle use Abd: Soft, nd, nt, no masses Rectal: +external hemorrhoid on R Psych: Age appropriate judgment and insight, normal affect and mood  Assessment and Plan: Constipation, unspecified constipation type  External hemorrhoids  Orders as above. No evidence of SBO/ileus. Cont stool softeners/miralax. May need enema. Does have evidence of ext hemorrhoid, likely cause of bleeding. Let us know if no improvement. No bleeding today. Pt states BP always elevated. F/u with pcp. F/u prn otherwise. The patient voiced understanding and agreement to  the plan.  [place], DO 11/05/17  4:21 PM

## 2017-12-01 ENCOUNTER — Encounter: Payer: Self-pay | Admitting: Family

## 2017-12-07 ENCOUNTER — Ambulatory Visit (INDEPENDENT_AMBULATORY_CARE_PROVIDER_SITE_OTHER): Admitting: Family

## 2017-12-07 ENCOUNTER — Encounter: Payer: Self-pay | Admitting: Family

## 2017-12-07 VITALS — BP 153/89 | HR 72 | Temp 98.4°F | Resp 18 | Ht 73.0 in | Wt 282.0 lb

## 2017-12-07 DIAGNOSIS — E118 Type 2 diabetes mellitus with unspecified complications: Secondary | ICD-10-CM

## 2017-12-07 DIAGNOSIS — J309 Allergic rhinitis, unspecified: Secondary | ICD-10-CM

## 2017-12-07 DIAGNOSIS — I10 Essential (primary) hypertension: Secondary | ICD-10-CM | POA: Diagnosis not present

## 2017-12-07 DIAGNOSIS — E785 Hyperlipidemia, unspecified: Secondary | ICD-10-CM | POA: Diagnosis not present

## 2017-12-07 DIAGNOSIS — J340 Abscess, furuncle and carbuncle of nose: Secondary | ICD-10-CM

## 2017-12-07 MED ORDER — BETAMETHASONE DIPROPIONATE 0.05 % EX CREA: TOPICAL_CREAM | CUTANEOUS | 0 refills | Status: DC

## 2017-12-07 NOTE — Patient Instructions (Addendum)
Increase hydralazine to three times daily.  Complete lab work prior to leaving.

## 2017-12-07 NOTE — Progress Notes (Signed)
Subjective:    Patient ID: Charles Courier., male    DOB: 04-Oct-1965, 52 y.o.   MRN: [id number]  HPI  Charles Nash is a 52 yr old male who presents today for follow up of his hypertension. Last visit we added amlodipine '5mg'$  for blood pressure. He continues hydralazine- ordered tid but he often forgets the mid-day dose.  He is also maintained on losartan '50mg'$  once daily (he is cutting in half) and toprol xl 100 mg once daily.   BP Readings from Last 3 Encounters:  12/07/17 (!) 153/89  11/05/17 (!) 140/96  09/28/17 138/86   Allergic rhinitis- not using singulair. Denies nasal congestion.   Review of Systems See HPI  Past Medical History:  Diagnosis Date  . ADHD (attention deficit hyperactivity disorder)   . Allergy   . Arthritis   . Bilateral varicoceles   . Depression   . Fatty liver 08/06/2011  . Genital herpes 10/25/2014  . GERD (gastroesophageal reflux disease)   . Hyperlipidemia   . Hypertension   . OSA (obstructive sleep apnea)   . Personal history of colonic polyps   . Plantar fasciitis   . PTSD (post-traumatic stress disorder)   . Sleep apnea      Social History   Socioeconomic History  . Marital status: Divorced    Spouse name: Not on file  . Number of children: 2  . Years of education: Not on file  . Highest education level: Not on file  Occupational History  . Occupation: Astronomer: [employer]  Social Needs  . Financial resource strain: Not on file  . Food insecurity:    Worry: Not on file    Inability: Not on file  . Transportation needs:    Medical: Not on file    Non-medical: Not on file  Tobacco Use  . Smoking status: Never Smoker  . Smokeless tobacco: Never Used  Substance and Sexual Activity  . Alcohol use: No    Alcohol/week: 0.0 standard drinks  . Drug use: No  . Sexual activity: Not on file  Lifestyle  . Physical activity:    Days per week: Not on file    Minutes per session: Not on file  . Stress: Not on  file  Relationships  . Social connections:    Talks on phone: Not on file    Gets together: Not on file    Attends religious service: Not on file    Active member of club or organization: Not on file    Attends meetings of clubs or organizations: Not on file    Relationship status: Not on file  . Intimate partner violence:    Fear of current or ex partner: Not on file    Emotionally abused: Not on file    Physically abused: Not on file    Forced sexual activity: Not on file  Other Topics Concern  . Not on file  Social History Narrative   Regular exercise:  No   Caffeine use:  2--32oz cups daily       Past Surgical History:  Procedure Laterality Date  . ANKLE SURGERY    . DOPPLER ECHOCARDIOGRAPHY  2010  . FOOT SURGERY    . KNEE SURGERY  08/04/08   Right knee-- medial meniscus repair, attenuation anterior cruciate [name])   . NM MYOVIEW LTD  2010  . PLANTAR FASCIA RELEASE    . SHOULDER SURGERY    .  SLEEVE GASTROPLASTY  10/29/2016  . VARICOCELE EXCISION      Family History  Problem Relation Age of Onset  . Diabetes Mother   . Hypertension Mother   . Arthritis Mother   . Cancer Mother        uterine and breast cancer, sarcoma of the brain  . Hyperlipidemia Father   . Arthritis Father   . Cancer Father        thyroid, prostate cancer  . Other Father        mass in stomach  . Hyperlipidemia Maternal Grandmother   . Hypertension Maternal Grandmother   . Hyperlipidemia Maternal Grandfather   . Hypertension Maternal Grandfather   . Hyperlipidemia Paternal Grandmother   . Hypertension Paternal Grandmother   . Hyperlipidemia Paternal Grandfather   . Hypertension Paternal Grandfather   . Heart attack Paternal Grandfather   . Sudden death Neg Hx   . Colon cancer Neg Hx   . Esophageal cancer Neg Hx   . Stomach cancer Neg Hx   . Rectal cancer Neg Hx     Allergies  Allergen Reactions  . Oxycodone-Acetaminophen Hives and Itching  .  Zoloft [Sertraline Hcl]     ?weight gain. "made me feel weird"    Current Outpatient Medications on File Prior to Visit  Medication Sig Dispense Refill  . amLODipine (NORVASC) 5 MG tablet Take 1 tablet (5 mg total) by mouth daily. 30 tablet 3  . atorvastatin (LIPITOR) 10 MG tablet TAKE 1 TABLET DAILY 90 tablet 1  . Blood Glucose Monitoring Suppl (ACCU-CHEK GUIDE) w/Device KIT 1 each by Does not apply route daily. 1 kit 0  . buPROPion (WELLBUTRIN XL) 300 MG 24 hr tablet Take 1 tablet (300 mg total) by mouth daily. (Patient taking differently: Take 150 mg by mouth daily. )    . CALCIUM-MAGNESIUM-VITAMIN D PO Take 1 tablet by mouth 3 (three) times daily.    . Cobalamine Combinations (U-63 + FOLIC ACID PO) Take by mouth. 1/2 dropper ful once a day.    . fexofenadine (ALLEGRA) 180 MG tablet Take 1 tablet (180 mg total) by mouth daily as needed for allergies or rhinitis. 30 tablet 2  . fluticasone (FLONASE) 50 MCG/ACT nasal spray Place 2 sprays into both nostrils daily. 16 g 6  . hydrALAZINE (APRESOLINE) 50 MG tablet TAKE 1 TABLET THREE TIMES A DAY 270 tablet 1  . hydrocortisone (ANUSOL-HC) 2.5 % rectal cream Apply rectally 2 times daily (Patient not taking: Reported on 11/05/2017) 28 g 0  . KLOR-CON M20 20 MEQ tablet TAKE 1 TABLET DAILY 90 tablet 1  . Lancets (FREESTYLE) lancets USE TO CHECK BLOOD SUGAR ONCE DAILY 100 each 1  . lidocaine (XYLOCAINE) 2 % jelly 1 application as needed.    Marland Kitchen losartan (COZAAR) 50 MG tablet Take 1 tablet (50 mg total) by mouth daily. 30 tablet 5  . metoprolol succinate (TOPROL XL) 100 MG 24 hr tablet Take 1 tablet (100 mg total) by mouth daily. Take with or immediately following a meal. 90 tablet 1  . montelukast (SINGULAIR) 10 MG tablet Take 1 tablet (10 mg total) by mouth at bedtime. (Patient taking differently: Take 10 mg by mouth at bedtime as needed. ) 30 tablet 3  . Multiple Vitamin (MULTI-VITAMINS) TABS Take 1 tablet by mouth daily.    . mupirocin ointment  (BACTROBAN) 2 % Place 1 application into the nose 2 (two) times daily. 22 g 0  . omeprazole (PRILOSEC) 20 MG capsule TAKE 2 CAPSULES  EVERY MORNING 180 capsule 1  . polyethylene glycol (MIRALAX / GLYCOLAX) packet     . PROAIR HFA 108 (90 Base) MCG/ACT inhaler Inhale 2 puffs into the lungs 4 (four) times daily as needed. 1 Inhaler 0  . Pyridoxine HCl (B-6 PO) Take 1 tablet by mouth daily.    . QUEtiapine (SEROQUEL) 25 MG tablet Take 1 tablet (25 mg total) by mouth at bedtime. (Patient not taking: Reported on 11/05/2017) 90 tablet 0  . sildenafil (VIAGRA) 100 MG tablet TAKE 1/2 TO 1 TABLET BY MOUTH DAILY AS NEEDED 15 tablet 1  . triamcinolone ointment (KENALOG) 0.1 % Apply to red itchy areas only below the face 80 g 3  . vitamin C (ASCORBIC ACID) 500 MG tablet Take 500 mg by mouth daily.    Marland Kitchen VYVANSE 20 MG capsule     . [DISCONTINUED] potassium chloride (K-DUR) 10 MEQ tablet Take 1 tablet (10 mEq total) by mouth daily. 30 tablet 1   No current facility-administered medications on file prior to visit.     BP (!) 153/89 (BP Location: Left Arm, Cuff Size: Large)   Pulse 72   Temp 98.4 F (36.9 C) (Oral)   Resp 18   Ht '6\' 1"'$  (1.854 m)   Wt 282 lb (127.9 kg)   SpO2 97%   BMI 37.21 kg/m       Objective:   Physical Exam  Constitutional: He is oriented to person, place, and time. He appears well-developed and well-nourished. No distress.  HENT:  Head: Normocephalic and atraumatic.  White shallow ulcer noted left nare at base of nasal septum  Cardiovascular: Normal rate and regular rhythm.  No murmur heard. Pulmonary/Chest: Effort normal and breath sounds normal. No respiratory distress. He has no wheezes. He has no rales.  Musculoskeletal: He exhibits no edema.  Neurological: He is alert and oriented to person, place, and time.  Skin: Skin is warm and dry.  Psychiatric: He has a normal mood and affect. His behavior is normal. Thought content normal.          Assessment & Plan:    Hyperlipidemia- obtain follow up lipid panel. Continue statin.  Lab Results  Component Value Date   CHOL 96 11/26/2016   HDL 33.00 (L) 11/26/2016   LDLCALC 48 11/26/2016   TRIG 79.0 11/26/2016   CHOLHDL 3 11/26/2016   DM2- clinically stable  Obtain a1c.  HTN- bp mildly elevated. Advised pt to increase hydralazine to tid. Continue other meds.    Allergic rhinitis- stable. Ok to remain off of singulair.   Nasal lesion- has been using bactroban ointment "it helps when it is on there."  But lesion persists.   Nasal ulcer- trial of steroid cream, pt is advised to let me know if it fails to improve- will plan referral to ENT at that time.

## 2017-12-08 LAB — LIPID PANEL
CHOLESTEROL: 174 mg/dL (ref 0–200)
HDL: 52.8 mg/dL (ref >39.00)
LDL Cholesterol: 101 mg/dL — ABNORMAL HIGH (ref 0–99)
NonHDL: 121.54
TRIGLYCERIDES: 103 mg/dL (ref 0.0–149.0)
Total CHOL/HDL Ratio: 3
VLDL: 20.6 mg/dL (ref 0.0–40.0)

## 2017-12-08 LAB — BASIC METABOLIC PANEL
BUN: 12 mg/dL (ref 6–23)
CO2: 30 meq/L (ref 19–32)
Calcium: 9.6 mg/dL (ref 8.4–10.5)
Chloride: 105 mEq/L (ref 96–112)
Creatinine, Ser: 1.29 mg/dL (ref 0.40–1.50)
GFR: 75.39 mL/min (ref >60.00)
GLUCOSE: 93 mg/dL (ref 70–99)
POTASSIUM: 4.2 meq/L (ref 3.5–5.1)
SODIUM: 141 meq/L (ref 135–145)

## 2017-12-08 LAB — HEMOGLOBIN A1C: Hgb A1c MFr Bld: 6.1 % (ref 4.6–6.5)

## 2017-12-29 ENCOUNTER — Encounter: Payer: Self-pay | Admitting: Physical Medicine & Rehabilitation

## 2017-12-29 ENCOUNTER — Ambulatory Visit (HOSPITAL_BASED_OUTPATIENT_CLINIC_OR_DEPARTMENT_OTHER): Admitting: Physical Medicine & Rehabilitation

## 2017-12-29 ENCOUNTER — Other Ambulatory Visit: Payer: Self-pay

## 2017-12-29 ENCOUNTER — Encounter: Attending: Physical Medicine & Rehabilitation

## 2017-12-29 VITALS — BP 146/91 | HR 69 | Ht 73.0 in | Wt 283.0 lb

## 2017-12-29 DIAGNOSIS — M533 Sacrococcygeal disorders, not elsewhere classified: Secondary | ICD-10-CM | POA: Diagnosis not present

## 2017-12-29 DIAGNOSIS — M545 Low back pain: Secondary | ICD-10-CM | POA: Diagnosis not present

## 2017-12-29 DIAGNOSIS — M7918 Myalgia, other site: Secondary | ICD-10-CM | POA: Diagnosis not present

## 2017-12-29 NOTE — Patient Instructions (Signed)
Sacroiliac injection was performed today. A combination of a naming medicine plus a cortisone medicine was injected. The injection was done under x-ray guidance. This procedure has been performed to help reduce low back and buttocks pain as well as potentially hip pain. The duration of this injection is variable lasting from hours to  Months. It may repeated if needed. 

## 2017-12-29 NOTE — Progress Notes (Signed)
  PROCEDURE RECORD [facility]   Name: Charles Nash. DOB:1965/12/27 MRN: [id number]  Date:12/29/2017  Physician: Alysia Penna, MD    Nurse/CMA: Betti Cruz  Allergies:  Allergies  Allergen Reactions  . Oxycodone-Acetaminophen Hives and Itching  . Zoloft [Sertraline Hcl]     ?weight gain. "made me feel weird"    Consent Signed: Yes.    Is patient diabetic? No.  CBG today?   Pregnant: No. LMP: No LMP for male patient. (age 76-55)  Anticoagulants: no Anti-inflammatory: no Antibiotics: no  Procedure:Right  Sacroiliac Injection Position: Prone Start Time: 3:20pm End Time: 3:24pm Fluoro Time: 16  RN/CMA Vaunda Gutterman,CMA Annis Lagoy,CMA    Time 3:01pm 3:28pm    BP 146/91 163/92    Pulse 69 67    Respirations 16 16    O2 Sat 98 97    S/S 6 6    Pain Level 4/10 3/10     D/C home with friend, patient A & O X 3, D/C instructions reviewed, and sits independently.

## 2017-12-29 NOTE — Progress Notes (Signed)
Right sacroiliac injection under fluoroscopic guidance  Indication: Right Low back and buttocks pain not relieved by medication management and other conservative care.  Informed consent was obtained after describing risks and benefits of the procedure with the patient, this includes bleeding, bruising, infection, paralysis and medication side effects. The patient wishes to proceed and has given written consent. The patient was placed in a prone position. The lumbar and sacral area was marked and prepped with Betadine. A 25-gauge 1-1/2 inch needle was inserted into the skin and subcutaneous tissue and 1 mL of 1% lidocaine was injected. Then a 25-gauge 3.5 inch spinal needle was inserted under fluoroscopic guidance into the Right sacroiliac joint. AP and lateral images were utilized. Omnipaque 180x0.5 mL under live fluoroscopy demonstrated no intravascular uptake. Then a solution containing one ML of 6 mgper ml betamethasone and 2 ML of 1% lidocaine MPF was injected x1.5 mL. Patient tolerated the procedure well. Post procedure instructions were given. Please see post procedure form.

## 2017-12-30 ENCOUNTER — Encounter: Payer: Self-pay | Admitting: Physical Medicine & Rehabilitation

## 2018-02-09 ENCOUNTER — Encounter: Attending: Physical Medicine & Rehabilitation

## 2018-02-09 ENCOUNTER — Ambulatory Visit: Payer: Self-pay | Admitting: Physical Medicine & Rehabilitation

## 2018-02-09 DIAGNOSIS — M7918 Myalgia, other site: Secondary | ICD-10-CM | POA: Insufficient documentation

## 2018-02-09 DIAGNOSIS — M545 Low back pain: Secondary | ICD-10-CM | POA: Insufficient documentation

## 2018-03-09 ENCOUNTER — Ambulatory Visit: Admitting: Family

## 2018-03-10 ENCOUNTER — Encounter: Payer: Self-pay | Admitting: Family

## 2018-03-10 ENCOUNTER — Ambulatory Visit (INDEPENDENT_AMBULATORY_CARE_PROVIDER_SITE_OTHER): Admitting: Family

## 2018-03-10 VITALS — BP 130/84 | HR 62 | Temp 97.9°F | Ht 73.0 in | Wt 288.2 lb

## 2018-03-10 DIAGNOSIS — F329 Major depressive disorder, single episode, unspecified: Secondary | ICD-10-CM

## 2018-03-10 DIAGNOSIS — E119 Type 2 diabetes mellitus without complications: Secondary | ICD-10-CM | POA: Diagnosis not present

## 2018-03-10 DIAGNOSIS — I10 Essential (primary) hypertension: Secondary | ICD-10-CM

## 2018-03-10 DIAGNOSIS — F419 Anxiety disorder, unspecified: Secondary | ICD-10-CM

## 2018-03-10 DIAGNOSIS — G4733 Obstructive sleep apnea (adult) (pediatric): Secondary | ICD-10-CM

## 2018-03-10 DIAGNOSIS — E785 Hyperlipidemia, unspecified: Secondary | ICD-10-CM | POA: Diagnosis not present

## 2018-03-10 LAB — BASIC METABOLIC PANEL
BUN: 13 mg/dL (ref 6–23)
CALCIUM: 9.5 mg/dL (ref 8.4–10.5)
CO2: 31 mEq/L (ref 19–32)
Chloride: 104 mEq/L (ref 96–112)
Creatinine, Ser: 1.16 mg/dL (ref 0.40–1.50)
GFR: 85.13 mL/min (ref >60.00)
Glucose, Bld: 93 mg/dL (ref 70–99)
Potassium: 4.1 mEq/L (ref 3.5–5.1)
SODIUM: 140 meq/L (ref 135–145)

## 2018-03-10 LAB — HEMOGLOBIN A1C: Hgb A1c MFr Bld: 6.1 % (ref 4.6–6.5)

## 2018-03-10 MED ORDER — ATORVASTATIN CALCIUM 10 MG PO TABS: 10.0000 mg | ORAL_TABLET | Freq: Every day | ORAL | 1 refills | Status: DC

## 2018-03-10 NOTE — Progress Notes (Signed)
Subjective:    Patient ID: Charles Courier., male    DOB: 16-Sep-1965, 52 y.o.   MRN: [id number]  HPI   Patient is a 52 yr old male who presents today for follow up.  DM2- reports that he has not been very compliant with his diet. He has cut sugar out x 1 week.  Reports that he is down 4 pounds since last Thursday.  Lab Results  Component Value Date   HGBA1C 6.1 12/07/2017   HGBA1C 5.7 03/18/2017   HGBA1C 6.5 11/26/2016   Lab Results  Component Value Date   MICROALBUR 2.3 (H) 01/26/2015   LDLCALC 101 (H) 12/07/2017   CREATININE 1.29 12/07/2017   HTN- maintained on hydralazine, losartan, amlodipine BP Readings from Last 3 Encounters:  03/10/18 130/84  12/29/17 (!) 146/91  12/07/17 (!) 153/89   OSA-reports that he just started back using his cpap.   Hyperlipidemia- not currently taking statin because he ran out.  Lab Results  Component Value Date   CHOL 174 12/07/2017   HDL 52.80 12/07/2017   LDLCALC 101 (H) 12/07/2017   TRIG 103.0 12/07/2017   CHOLHDL 3 12/07/2017   Anxiety/Depression- maintained on wellbutrin/seroquel.  Reports mood and anxiety is stable.   vyvanse-this is being prescribed by adhd and is being prescribed by his psychiatrist.       Review of Systems See HPI  Past Medical History:  Diagnosis Date  . ADHD (attention deficit hyperactivity disorder)   . Allergy   . Arthritis   . Bilateral varicoceles   . Depression   . Fatty liver 08/06/2011  . Genital herpes 10/25/2014  . GERD (gastroesophageal reflux disease)   . Hyperlipidemia   . Hypertension   . OSA (obstructive sleep apnea)   . Personal history of colonic polyps   . Plantar fasciitis   . PTSD (post-traumatic stress disorder)   . Sleep apnea      Social History   Socioeconomic History  . Marital status: Divorced    Spouse name: Not on file  . Number of children: 2  . Years of education: Not on file  . Highest education level: Not on file  Occupational History  .  Occupation: Astronomer: [employer]  Social Needs  . Financial resource strain: Not on file  . Food insecurity:    Worry: Not on file    Inability: Not on file  . Transportation needs:    Medical: Not on file    Non-medical: Not on file  Tobacco Use  . Smoking status: Never Smoker  . Smokeless tobacco: Never Used  Substance and Sexual Activity  . Alcohol use: No    Alcohol/week: 0.0 standard drinks  . Drug use: No  . Sexual activity: Not on file  Lifestyle  . Physical activity:    Days per week: Not on file    Minutes per session: Not on file  . Stress: Not on file  Relationships  . Social connections:    Talks on phone: Not on file    Gets together: Not on file    Attends religious service: Not on file    Active member of club or organization: Not on file    Attends meetings of clubs or organizations: Not on file    Relationship status: Not on file  . Intimate partner violence:    Fear of current or ex partner: Not on file    Emotionally abused: Not on file  Physically abused: Not on file    Forced sexual activity: Not on file  Other Topics Concern  . Not on file  Social History Narrative   Regular exercise:  No   Caffeine use:  2--32oz cups daily       Past Surgical History:  Procedure Laterality Date  . ANKLE SURGERY    . DOPPLER ECHOCARDIOGRAPHY  2010  . FOOT SURGERY    . KNEE SURGERY  08/04/08   Right knee-- medial meniscus repair, attenuation anterior cruciate [name] MD [facility])   . NM MYOVIEW LTD  2010  . PLANTAR FASCIA RELEASE    . SHOULDER SURGERY    . SLEEVE GASTROPLASTY  10/29/2016  . VARICOCELE EXCISION      Family History  Problem Relation Age of Onset  . Diabetes Mother   . Hypertension Mother   . Arthritis Mother   . Cancer Mother        uterine and breast cancer, sarcoma of the brain  . Hyperlipidemia Father   . Arthritis Father   . Cancer Father        thyroid, prostate cancer  . Other  Father        mass in stomach  . Hyperlipidemia Maternal Grandmother   . Hypertension Maternal Grandmother   . Hyperlipidemia Maternal Grandfather   . Hypertension Maternal Grandfather   . Hyperlipidemia Paternal Grandmother   . Hypertension Paternal Grandmother   . Hyperlipidemia Paternal Grandfather   . Hypertension Paternal Grandfather   . Heart attack Paternal Grandfather   . Sudden death Neg Hx   . Colon cancer Neg Hx   . Esophageal cancer Neg Hx   . Stomach cancer Neg Hx   . Rectal cancer Neg Hx     Allergies  Allergen Reactions  . Oxycodone-Acetaminophen Hives and Itching  . Zoloft [Sertraline Hcl]     ?weight gain. "made me feel weird"    Current Outpatient Medications on File Prior to Visit  Medication Sig Dispense Refill  . amLODipine (NORVASC) 5 MG tablet Take 1 tablet (5 mg total) by mouth daily. 30 tablet 3  . betamethasone dipropionate (DIPROLENE) 0.05 % cream Apply to area inside nose once daily. 15 g 0  . Blood Glucose Monitoring Suppl (ACCU-CHEK GUIDE) w/Device KIT 1 each by Does not apply route daily. 1 kit 0  . buPROPion (WELLBUTRIN XL) 300 MG 24 hr tablet Take 1 tablet (300 mg total) by mouth daily. (Patient taking differently: Take 150 mg by mouth daily. )    . CALCIUM-MAGNESIUM-VITAMIN D PO Take 1 tablet by mouth 3 (three) times daily.    . fexofenadine (ALLEGRA) 180 MG tablet Take 1 tablet (180 mg total) by mouth daily as needed for allergies or rhinitis. 30 tablet 2  . fluticasone (FLONASE) 50 MCG/ACT nasal spray Place 2 sprays into both nostrils daily. 16 g 6  . hydrALAZINE (APRESOLINE) 50 MG tablet TAKE 1 TABLET THREE TIMES A DAY 270 tablet 1  . KLOR-CON M20 20 MEQ tablet TAKE 1 TABLET DAILY 90 tablet 1  . Lancets (FREESTYLE) lancets USE TO CHECK BLOOD SUGAR ONCE DAILY 100 each 1  . lidocaine (XYLOCAINE) 2 % jelly 1 application as needed.    Marland Kitchen losartan (COZAAR) 50 MG tablet Take 1 tablet (50 mg total) by mouth daily. 30 tablet 5  . metoprolol succinate  (TOPROL XL) 100 MG 24 hr tablet Take 1 tablet (100 mg total) by mouth daily. Take with or immediately following a meal. 90 tablet  1  . Multiple Vitamin (MULTI-VITAMINS) TABS Take 1 tablet by mouth daily.    . mupirocin ointment (BACTROBAN) 2 % Place 1 application into the nose 2 (two) times daily. 22 g 0  . omeprazole (PRILOSEC) 20 MG capsule TAKE 2 CAPSULES EVERY MORNING 180 capsule 1  . polyethylene glycol (MIRALAX / GLYCOLAX) packet     . PROAIR HFA 108 (90 Base) MCG/ACT inhaler Inhale 2 puffs into the lungs 4 (four) times daily as needed. 1 Inhaler 0  . Pyridoxine HCl (B-6 PO) Take 1 tablet by mouth daily.    . QUEtiapine (SEROQUEL) 25 MG tablet Take 1 tablet (25 mg total) by mouth at bedtime. 90 tablet 0  . sildenafil (VIAGRA) 100 MG tablet TAKE 1/2 TO 1 TABLET BY MOUTH DAILY AS NEEDED 15 tablet 1  . triamcinolone ointment (KENALOG) 0.1 % Apply to red itchy areas only below the face 80 g 3  . vitamin C (ASCORBIC ACID) 500 MG tablet Take 500 mg by mouth daily.    Marland Kitchen VYVANSE 20 MG capsule     . Cobalamine Combinations (B-58 + FOLIC ACID PO) Take by mouth. 1/2 dropper ful once a day.    . [DISCONTINUED] potassium chloride (K-DUR) 10 MEQ tablet Take 1 tablet (10 mEq total) by mouth daily. 30 tablet 1   No current facility-administered medications on file prior to visit.     BP 130/84 (BP Location: Left Arm, Patient Position: Sitting, Cuff Size: Large)   Pulse 62   Temp 97.9 F (36.6 C) (Oral)   Ht '6\' 1"'$  (1.854 m)   Wt 288 lb 3.2 oz (130.7 kg)   SpO2 97%   BMI 38.02 kg/m       Objective:   Physical Exam  Constitutional: He is oriented to person, place, and time. He appears well-developed and well-nourished. No distress.  HENT:  Head: Normocephalic and atraumatic.  Cardiovascular: Normal rate and regular rhythm.  No murmur heard. Pulmonary/Chest: Effort normal and breath sounds normal. No respiratory distress. He has no wheezes. He has no rales.  Musculoskeletal: He exhibits no  edema.  Neurological: He is alert and oriented to person, place, and time.  Skin: Skin is warm and dry.  Psychiatric: He has a normal mood and affect. His behavior is normal. Thought content normal.          Assessment & Plan:  DM2- clinically stable. Continue low sugar/low carb diet.Obtain follow up A1C.   HTN- BP stable, continue current meds. Obtain follow up bmet.   Hyperlipidemia- was stable last check  On statin, restart statin.   Anxiety/Depression- stable on current meds. Continue same.   OSA- encouraged continued compliance.

## 2018-03-10 NOTE — Patient Instructions (Signed)
Please complete lab work prior to leaving. Follow up in 3 months.  

## 2018-03-29 ENCOUNTER — Encounter: Payer: Self-pay | Admitting: Family

## 2018-03-29 DIAGNOSIS — R869 Unspecified abnormal finding in specimens from male genital organs: Secondary | ICD-10-CM

## 2018-03-31 NOTE — Addendum Note (Signed)
Addended by: Debbrah Alar on: 03/31/2018 09:18 AM   Modules accepted: Orders

## 2018-03-31 NOTE — Telephone Encounter (Signed)
See mychart.  

## 2018-04-12 ENCOUNTER — Other Ambulatory Visit: Payer: Self-pay | Admitting: Family

## 2018-04-27 ENCOUNTER — Encounter: Payer: Self-pay | Admitting: Family

## 2018-04-29 ENCOUNTER — Encounter: Payer: Self-pay | Admitting: Allergy & Immunology

## 2018-04-29 ENCOUNTER — Ambulatory Visit (INDEPENDENT_AMBULATORY_CARE_PROVIDER_SITE_OTHER): Admitting: Allergy & Immunology

## 2018-04-29 VITALS — BP 138/80 | HR 60 | Temp 98.2°F | Resp 18 | Ht 71.0 in | Wt 290.0 lb

## 2018-04-29 DIAGNOSIS — L508 Other urticaria: Secondary | ICD-10-CM | POA: Diagnosis not present

## 2018-04-29 DIAGNOSIS — J4520 Mild intermittent asthma, uncomplicated: Secondary | ICD-10-CM

## 2018-04-29 MED ORDER — TRIAMCINOLONE ACETONIDE 0.1 % EX OINT: TOPICAL_OINTMENT | CUTANEOUS | 3 refills | Status: DC

## 2018-04-29 MED ORDER — FLUTICASONE PROPIONATE 50 MCG/ACT NA SUSP: 2.0000 | Freq: Every day | NASAL | 5 refills | Status: AC | PRN

## 2018-04-29 MED ORDER — LEVOCETIRIZINE DIHYDROCHLORIDE 5 MG PO TABS: ORAL_TABLET | ORAL | 5 refills | Status: DC

## 2018-04-29 MED ORDER — ALBUTEROL SULFATE HFA 108 (90 BASE) MCG/ACT IN AERS: 2.0000 | INHALATION_SPRAY | RESPIRATORY_TRACT | 1 refills | Status: DC | PRN

## 2018-04-29 NOTE — Progress Notes (Signed)
FOLLOW UP  Date of Service/Encounter:  04/29/18   Assessment:   Chronic urticaria  Mild intermittent asthma without complication  Seasonal and perennial allergic rhinitis (grasses, weeds, multiple trees, indoor and outdoor molds, tobacco)  Plan/Recommendations:   1. Chronic urticaria - Your history does not have any "red flags" such as fevers, joint pains, or permanent skin changes that would be concerning for a more serious cause of hives.  - We will get some labs to update your environmental allergies and get a few screening labs for serious causes of itching and hives.   - We will call you in 1-2 weeks with the results of the testing.  - Chronic hives are often times a self limited process and will "burn themselves out" over 6-12 months, although this is not always the case.  - In the meantime, start suppressive dosing of antihistamines:   - Morning: Zyrtec (cetirizine) 10-20mg  (one or two tablets)  - Evening: Zyrtec (cetirizine) 10-20mg  (one or two tablets) - You can change this dosing at home, decreasing the dose as needed or increasing the dosing as needed.  - If you are not tolerating the medications or are tired of taking them every day, we can start treatment with a monthly injectable medication called Xolair (information on this provided today).  2. Intermittent asthma, uncomplicated - Lung testing looks great. - Continue with albuterol every 4-6 hours as needed. - There is not indication for a controller medication at this time.  3. Return in about 6 months (around 10/28/2018).  Subjective:   Charles Rami. is a 53 y.o. male presenting today for follow up of  Chief Complaint  Patient presents with  . Pruritus    back, ears, arms.  No rash or hives.  feels like itching under skin.    Charles Nash. has a history of the following: Patient Active Problem List   Diagnosis Date Noted  . Myofascial pain dysfunction syndrome 01/02/2017  . Insect bites  11/10/2016  . Diabetic polyneuropathy associated with type 2 diabetes mellitus ([place]) 08/25/2016  . Achilles tendinitis, left leg 08/25/2016  . Achilles tendon contracture, left 08/25/2016  . Xerosis of skin 04/22/2016  . Chronic urticaria 03/05/2016  . Sacroiliac joint dysfunction of both sides 01/01/2016  . Anxiety state 08/20/2015  . 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 Patellofemoral disorder 05/15/2015  . Current use of beta blocker 05/15/2015  . Genital herpes 10/25/2014  . Depression 10/05/2014  . Osteoarthritis of left knee 08/15/2014  . 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 Sciatica 04/22/2014  . Osteoarthritis of right knee 02/06/2014  . Allergy to mold 12/13/2013  . Weight gain, abnormal 11/08/2013  . PTSD (post-traumatic stress disorder) 07/05/2013  . Multinodular goiter 01/19/2013  . GERD (gastroesophageal reflux disease) 08/09/2012  . Dermographia 12/29/2011  . Neck pain 11/12/2011  . Fatty liver 08/06/2011  . Cervical disc disease 07/28/2011  . Preventative health care 07/28/2011  . Erectile dysfunction 05/23/2011  . Elevated liver function tests 05/20/2011  . Type II diabetes mellitus, well controlled ([place]) 05/19/2011  . Essential hypertension 05/19/2011  . Hyperlipidemia 05/19/2011  . Morbid obesity ([place]) 05/19/2011  . Other allergic rhinitis 05/19/2011  . Obstructive sleep apnea treated with continuous positive airway pressure (CPAP) 05/19/2011    History obtained from: chart review and patient.  Charles Maxim Jr.'s Primary Care Provider is Debbrah Alar, NP.     Charles Nash is a 53 y.o. male presenting for a sick visit.  He  was last seen in March 2019.  At that time, he had recently dyed his hair and reacted to the dye itself.  He reported bumps on his head and he was treating it with a mixture of water and apple cider vinegar.  He had not tried any antihistamines to help with itch.  Dr. Nelva Bush recommended triamcinolone  ointment 1-2 times daily as well as Clobex shampoo.  She also recommended Zyrtec 10 mg daily to help with the itch.  Since the last visit, he reports that he has been having itching episodes with dry skin. Itching occurs over the entire body. Things have been ongoing for around 6 weeks. He went to [place] and he felt that it started with the water there. However the itching continued despite coming back to [place]. He reports that he gets paranoid when he is in his bed at night. He denies recent episodes of bed bugs.    He has been told in the past that he has chronic urticaria.  It seems that this with diagnosis was made when Dr. Emilio Math was still working here.  Charles Nash was never on chronic suppressive doses of antihistamines or Xolair.  It does not seem that he has had a lab work-up for hives as far as I can tell.  He did move to a new apartment 18 months ago, otherwise no major life changes. He has no animal at all.  His last testing was performed in October 2013.  At that time, he was positive to [place] grass, 1 weed, multiple trees, multiple indoor and outdoor molds, and tobacco leaf.  He also had the entire food panel performed which was completely nonreactive.  Otherwise, there have been no changes to his past medical history, surgical history, family history, or social history.    Review of Systems: a 14-point review of systems is pertinent for what is mentioned in HPI.  Otherwise, all other systems were negative.  Constitutional: negative other than that listed in the HPI Eyes: negative other than that listed in the HPI Ears, nose, mouth, throat, and face: negative other than that listed in the HPI Respiratory: negative other than that listed in the HPI Cardiovascular: negative other than that listed in the HPI Gastrointestinal: negative other than that listed in the HPI Genitourinary: negative other than that listed in the HPI Integument: negative other than that listed in  the HPI Hematologic: negative other than that listed in the HPI Musculoskeletal: negative other than that listed in the HPI Neurological: negative other than that listed in the HPI Allergy/Immunologic: negative other than that listed in the HPI    Objective:   Blood pressure 138/80, pulse 60, temperature 98.2 F (36.8 C), temperature source Oral, resp. rate 18, height 5\' 11"  (1.803 m), weight 290 lb (131.5 kg), SpO2 98 %. Body mass index is 40.45 kg/m.   Physical Exam:  General: Alert, interactive, in no acute distress. Pleasant male. Talkative.  Eyes: No conjunctival injection bilaterally, no discharge on the right, no discharge on the left and no Horner-Trantas dots present. PERRL bilaterally. EOMI without pain. No photophobia.  Ears: Right TM pearly gray with normal light reflex, Left TM pearly gray with normal light reflex, Right TM intact without perforation and Left TM intact without perforation.  Nose/Throat: External nose within normal limits and septum midline. Turbinates edematous with clear discharge. Posterior oropharynx erythematous without cobblestoning in the posterior oropharynx. Tonsils 2+ without exudates.  Tongue without thrush. Lungs: Clear to auscultation without wheezing, rhonchi  or rales. No increased work of breathing. CV: Normal S1/S2. No murmurs. Capillary refill <2 seconds.  Skin: Warm and dry, without lesions or rashes. There is no dermatographism present.  Neuro:   Grossly intact. No focal deficits appreciated. Responsive to questions.  Diagnostic studies:   Spirometry: results normal (FEV1: 3.24/94%, FVC: 3.86/90%, FEV1/FVC: 84%).    Spirometry consistent with normal pattern.   Allergy Studies: none      Salvatore Marvel, MD  [facility]

## 2018-04-29 NOTE — Patient Instructions (Addendum)
1. Chronic urticaria - Your history does not have any "red flags" such as fevers, joint pains, or permanent skin changes that would be concerning for a more serious cause of hives.  - We will get some labs to update your environmental allergies and get a few screening labs for serious causes of itching and hives.   - We will call you in 1-2 weeks with the results of the testing.  - Chronic hives are often times a self limited process and will "burn themselves out" over 6-12 months, although this is not always the case.  - In the meantime, start suppressive dosing of antihistamines:   - Morning: Zyrtec (cetirizine) 10-20mg  (one or two tablets)  - Evening: Zyrtec (cetirizine) 10-20mg  (one or two tablets) - You can change this dosing at home, decreasing the dose as needed or increasing the dosing as needed.  - If you are not tolerating the medications or are tired of taking them every day, we can start treatment with a monthly injectable medication called Xolair (information on this provided today).  2. Return in about 6 months (around 10/28/2018).    Please inform us of any Emergency Department visits, hospitalizations, or changes in symptoms. Call us before going to the ED for breathing or allergy symptoms since we might be able to fit you in for a sick visit. Feel free to contact us anytime with any questions, problems, or concerns.  It was a pleasure to meet you today! Good luck with your book and your lecture series!   Websites that have reliable patient information: 1. American Academy of Asthma, Allergy, and Immunology: www.aaaai.org 2. Food Allergy Research and Education (FARE): foodallergy.org 3. Mothers of Asthmatics: http://www.asthmacommunitynetwork.org 4. American College of Allergy, Asthma, and Immunology: MonthlyElectricBill.co.uk   Make sure you are registered to vote! If you have moved or changed any of your contact information, you will need to get this updated before voting!    Voter  ID laws are going into effect for the General Election in November 2020! Be prepared! Check out http://levine.[email or web address] for more details.

## 2018-06-07 ENCOUNTER — Encounter: Attending: Physical Medicine & Rehabilitation

## 2018-06-07 ENCOUNTER — Ambulatory Visit (HOSPITAL_BASED_OUTPATIENT_CLINIC_OR_DEPARTMENT_OTHER): Admitting: Physical Medicine & Rehabilitation

## 2018-06-07 ENCOUNTER — Encounter: Payer: Self-pay | Admitting: Physical Medicine & Rehabilitation

## 2018-06-07 VITALS — BP 143/84 | HR 68 | Ht 73.0 in | Wt 294.0 lb

## 2018-06-07 DIAGNOSIS — Z9989 Dependence on other enabling machines and devices: Secondary | ICD-10-CM | POA: Insufficient documentation

## 2018-06-07 DIAGNOSIS — G4733 Obstructive sleep apnea (adult) (pediatric): Secondary | ICD-10-CM | POA: Insufficient documentation

## 2018-06-07 DIAGNOSIS — M533 Sacrococcygeal disorders, not elsewhere classified: Secondary | ICD-10-CM | POA: Insufficient documentation

## 2018-06-07 DIAGNOSIS — E119 Type 2 diabetes mellitus without complications: Secondary | ICD-10-CM | POA: Insufficient documentation

## 2018-06-07 NOTE — Progress Notes (Signed)
Subjective:    Patient ID: Charles Nash., male    DOB: 1965-04-17, 53 y.o.   MRN: [id number]  HPI 53 year old male who has a history of chronic low back pain secondary to lumbar spondylosis.  His lumbar pain has improved status post radiofrequency neurotomy which was performed bilaterally at L3-L4 medial branches and L5 dorsal ramus on the right side on 10/10/2016, on the left side 04/01/2016 Patient complains of pain below his belt he has undergone right-sided sacroiliac injections on 2 occasions with greater than 50% relief but relief lasted for less than a week these were performed on 09/28/2017 and 12/29/2017.  Right sided buttocks pain without radiation distal to the thigh  Pain Inventory Average Pain 5 Pain Right Now 5 My pain is sharp and aching  In the last 24 hours, has pain interfered with the following? General activity 7 Relation with others 3 Enjoyment of life 6 What TIME of day is your pain at its worst? evening Sleep (in general) Poor  Pain is worse with: inactivity, standing and some activites Pain improves with: nothing Relief from Meds: 0  Mobility walk with assistance how many minutes can you walk? 5 ability to climb steps?  yes do you drive?  yes Do you have any goals in this area?  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  no  Physicians involved in your care Any changes since last visit?  no   Family History  Problem Relation Age of Onset  . Diabetes Mother   . Hypertension Mother   . Arthritis Mother   . Cancer Mother        uterine and breast cancer, sarcoma of the brain  . Hyperlipidemia Father   . Arthritis Father   . Cancer Father        thyroid, prostate cancer  . Other Father        mass in stomach  . Hyperlipidemia Maternal Grandmother   . Hypertension Maternal Grandmother   . Hyperlipidemia Maternal Grandfather   . Hypertension Maternal Grandfather   .  Hyperlipidemia Paternal Grandmother   . Hypertension Paternal Grandmother   . Hyperlipidemia Paternal Grandfather   . Hypertension Paternal Grandfather   . Heart attack Paternal Grandfather   . Sudden death Neg Hx   . Colon cancer Neg Hx   . Esophageal cancer Neg Hx   . Stomach cancer Neg Hx   . Rectal cancer Neg Hx    Social History   Socioeconomic History  . Marital status: Divorced    Spouse name: Not on file  . Number of children: 2  . Years of education: Not on file  . Highest education level: Not on file  Occupational History  . Occupation: Astronomer: [employer]  Social Needs  . Financial resource strain: Not on file  . Food insecurity:    Worry: Not on file    Inability: Not on file  . Transportation needs:    Medical: Not on file    Non-medical: Not on file  Tobacco Use  . Smoking status: Never Smoker  . Smokeless tobacco: Never Used  Substance and Sexual Activity  . Alcohol use: No    Alcohol/week: 0.0 standard drinks  . Drug use: No  . Sexual activity: Not on file  Lifestyle  . Physical activity:    Days per week: Not on file    Minutes per session:  Not on file  . Stress: Not on file  Relationships  . Social connections:    Talks on phone: Not on file    Gets together: Not on file    Attends religious service: Not on file    Active member of club or organization: Not on file    Attends meetings of clubs or organizations: Not on file    Relationship status: Not on file  Other Topics Concern  . Not on file  Social History Narrative   Regular exercise:  No   Caffeine use:  2--32oz cups daily      Past Surgical History:  Procedure Laterality Date  . ANKLE SURGERY    . DOPPLER ECHOCARDIOGRAPHY  2010  . FOOT SURGERY    . KNEE SURGERY  08/04/08   Right knee-- medial meniscus repair, attenuation anterior cruciate [name] MD [facility])   . NM MYOVIEW LTD  2010  . PLANTAR FASCIA RELEASE    . SHOULDER SURGERY     . SLEEVE GASTROPLASTY  10/29/2016  . VARICOCELE EXCISION     Past Medical History:  Diagnosis Date  . ADHD (attention deficit hyperactivity disorder)   . Allergy   . Arthritis   . Bilateral varicoceles   . Depression   . Fatty liver 08/06/2011  . Genital herpes 10/25/2014  . GERD (gastroesophageal reflux disease)   . Hyperlipidemia   . Hypertension   . OSA (obstructive sleep apnea)   . Personal history of colonic polyps   . Plantar fasciitis   . PTSD (post-traumatic stress disorder)   . Sleep apnea    BP (!) 143/84   Pulse 68   Ht 6\' 1"  (1.854 m)   Wt 294 lb (133.4 kg)   SpO2 95%   BMI 38.79 kg/m   Opioid Risk Score:   Fall Risk Score:  `1  Depression screen PHQ 2/9  Depression screen [facility] 2/9 12/29/2017 09/18/2017 07/23/2016 01/01/2016 07/23/2015 06/25/2015 06/04/2015  Decreased Interest 0 0 2 0 0 1 1  Down, Depressed, Hopeless 0 0 0 0 0 1 1  PHQ - 2 Score 0 0 2 0 0 2 2  Altered sleeping - - 3 - - - -  Tired, decreased energy - - 3 - - - -  Change in appetite - - 3 - - - -  Feeling bad or failure about yourself  - - 0 - - - -  Trouble concentrating - - 3 - - - -  Moving slowly or fidgety/restless - - 0 - - - -  Suicidal thoughts - - 0 - - - -  PHQ-9 Score - - 14 - - - -  Difficult doing work/chores - - - - - - -  Some recent data might be hidden    Review of Systems  Constitutional: Negative.   HENT: Negative.   Eyes: Negative.   Respiratory: Negative.   Cardiovascular: Negative.   Gastrointestinal: Negative.   Endocrine: Negative.   Genitourinary: Negative.   Musculoskeletal: Positive for arthralgias and back pain.       Spasms   Skin: Negative.   Allergic/Immunologic: Negative.   Neurological: Negative.   Hematological: Negative.   Psychiatric/Behavioral: Negative.   All other systems reviewed and are negative.      Objective:   Physical Exam No pain with hip range of motion Overweight male in no acute distress Mood and affect are  appropriate Back is mild tenderness palpation left PSIS, moderate tenderness right PSIS No tenderness  at the lumbar paraspinals. Negative straight leg raise Lower extremity strength is normal Gait is without evidence of toe drag or knee instability.    Assessment & Plan:  #1.  Right sided buttocks pain which has been relieved on 2 occasions with sacroiliac intra-articular injections but these were of only temporary benefit.  We discussed other treatment options these include L5 dorsal ramus plus S1-S2-S3 lateral branch blocks to denervate right sacroiliac.  If blocks produce a greater than 50% temporary relief would proceed with radiofrequency to these nerves.

## 2018-06-11 ENCOUNTER — Ambulatory Visit (INDEPENDENT_AMBULATORY_CARE_PROVIDER_SITE_OTHER): Admitting: Family

## 2018-06-11 ENCOUNTER — Encounter: Payer: Self-pay | Admitting: Family

## 2018-06-11 VITALS — BP 157/85 | HR 69 | Temp 98.2°F | Resp 16 | Ht 73.0 in | Wt 293.0 lb

## 2018-06-11 DIAGNOSIS — E785 Hyperlipidemia, unspecified: Secondary | ICD-10-CM

## 2018-06-11 DIAGNOSIS — F329 Major depressive disorder, single episode, unspecified: Secondary | ICD-10-CM

## 2018-06-11 DIAGNOSIS — G4733 Obstructive sleep apnea (adult) (pediatric): Secondary | ICD-10-CM

## 2018-06-11 DIAGNOSIS — E119 Type 2 diabetes mellitus without complications: Secondary | ICD-10-CM | POA: Diagnosis not present

## 2018-06-11 DIAGNOSIS — F419 Anxiety disorder, unspecified: Secondary | ICD-10-CM

## 2018-06-11 DIAGNOSIS — F32A Depression, unspecified: Secondary | ICD-10-CM

## 2018-06-11 DIAGNOSIS — Z9989 Dependence on other enabling machines and devices: Secondary | ICD-10-CM

## 2018-06-11 DIAGNOSIS — F909 Attention-deficit hyperactivity disorder, unspecified type: Secondary | ICD-10-CM

## 2018-06-11 LAB — LIPID PANEL
Cholesterol: 175 mg/dL (ref 0–200)
HDL: 56.8 mg/dL (ref >39.00)
LDL Cholesterol: 98 mg/dL (ref 0–99)
NonHDL: 118.35
Total CHOL/HDL Ratio: 3
Triglycerides: 100 mg/dL (ref 0.0–149.0)
VLDL: 20 mg/dL (ref 0.0–40.0)

## 2018-06-11 LAB — HEPATIC FUNCTION PANEL
ALT: 20 U/L (ref 0–53)
AST: 19 U/L (ref 0–37)
Albumin: 4.4 g/dL (ref 3.5–5.2)
Alkaline Phosphatase: 107 U/L (ref 39–117)
Bilirubin, Direct: 0.1 mg/dL (ref 0.0–0.3)
TOTAL PROTEIN: 7.2 g/dL (ref 6.0–8.3)
Total Bilirubin: 0.7 mg/dL (ref 0.2–1.2)

## 2018-06-11 LAB — BASIC METABOLIC PANEL
BUN: 9 mg/dL (ref 6–23)
CHLORIDE: 102 meq/L (ref 96–112)
CO2: 26 mEq/L (ref 19–32)
CREATININE: 0.86 mg/dL (ref 0.40–1.50)
Calcium: 9.2 mg/dL (ref 8.4–10.5)
GFR: 113.03 mL/min (ref >60.00)
Glucose, Bld: 113 mg/dL — ABNORMAL HIGH (ref 70–99)
Potassium: 3.9 mEq/L (ref 3.5–5.1)
Sodium: 138 mEq/L (ref 135–145)

## 2018-06-11 LAB — HEMOGLOBIN A1C: Hgb A1c MFr Bld: 6.1 % (ref 4.6–6.5)

## 2018-06-11 MED ORDER — ATORVASTATIN CALCIUM 10 MG PO TABS: 10.0000 mg | ORAL_TABLET | Freq: Every day | ORAL | 1 refills | Status: DC

## 2018-06-11 NOTE — Patient Instructions (Addendum)
Please complete lab work prior to leaving. Restart cpap.

## 2018-06-11 NOTE — Progress Notes (Signed)
Subjective:    Patient ID: Charles Courier., male    DOB: [birth year], 53 y.o.   MRN: [id number]  HPI   Patient is a 53 yr old male who presents today for follow up.  Lab Results  Component Value Date   HGBA1C 6.1 03/10/2018   HGBA1C 6.1 12/07/2017   HGBA1C 5.7 03/18/2017   Lab Results  Component Value Date   MICROALBUR 2.3 (H) 01/26/2015   LDLCALC 101 (H) 12/07/2017   CREATININE 1.16 03/10/2018   DM2- reports that last visit  Lab Results  Component Value Date   HGBA1C 6.1 03/10/2018   HGBA1C 6.1 12/07/2017   HGBA1C 5.7 03/18/2017   Lab Results  Component Value Date   MICROALBUR 2.3 (H) 01/26/2015   LDLCALC 101 (H) 12/07/2017   CREATININE 1.16 03/10/2018   OSA- reports that he has not been using cpap regularly.   Anxiety/depression- on wellbutrin/seroquel. He has had weight gain. Believes that seroquel is contributing. Reports mood has been OK.   ADHD- being treated with vyvanse by his psychiatrist. Hasn't had this medication in 2 weeks.    Hyperlipidemia- last visit we restarted his statin. Wt Readings from Last 3 Encounters:  06/11/18 293 lb (132.9 kg)  06/07/18 294 lb (133.4 kg)  04/29/18 290 lb (131.5 kg)   HTN-  BP Readings from Last 3 Encounters:  06/11/18 (!) 157/85  06/07/18 (!) 143/84  04/29/18 138/80      Review of Systems    see HPI  Past Medical History:  Diagnosis Date  . ADHD (attention deficit hyperactivity disorder)   . Allergy   . Arthritis   . Bilateral varicoceles   . Depression   . Fatty liver 08/06/2011  . Genital herpes 10/25/2014  . GERD (gastroesophageal reflux disease)   . Hyperlipidemia   . Hypertension   . OSA (obstructive sleep apnea)   . Personal history of colonic polyps   . Plantar fasciitis   . PTSD (post-traumatic stress disorder)   . Sleep apnea      Social History   Socioeconomic History  . Marital status: Divorced    Spouse name: Not on file  . Number of children: 2  . Years of education: Not on  file  . Highest education level: Not on file  Occupational History  . Occupation: Astronomer: [employer]  Social Needs  . Financial resource strain: Not on file  . Food insecurity:    Worry: Not on file    Inability: Not on file  . Transportation needs:    Medical: Not on file    Non-medical: Not on file  Tobacco Use  . Smoking status: Never Smoker  . Smokeless tobacco: Never Used  Substance and Sexual Activity  . Alcohol use: No    Alcohol/week: 0.0 standard drinks  . Drug use: No  . Sexual activity: Not on file  Lifestyle  . Physical activity:    Days per week: Not on file    Minutes per session: Not on file  . Stress: Not on file  Relationships  . Social connections:    Talks on phone: Not on file    Gets together: Not on file    Attends religious service: Not on file    Active member of club or organization: Not on file    Attends meetings of clubs or organizations: Not on file    Relationship status: Not on file  . Intimate partner violence:  Fear of current or ex partner: Not on file    Emotionally abused: Not on file    Physically abused: Not on file    Forced sexual activity: Not on file  Other Topics Concern  . Not on file  Social History Narrative   Regular exercise:  No   Caffeine use:  2--32oz cups daily       Past Surgical History:  Procedure Laterality Date  . ANKLE SURGERY    . DOPPLER ECHOCARDIOGRAPHY  2010  . FOOT SURGERY    . KNEE SURGERY  08/04/08   Right knee-- medial meniscus repair, attenuation anterior cruciate [name] MD [facility])   . NM MYOVIEW LTD  2010  . PLANTAR FASCIA RELEASE    . SHOULDER SURGERY    . SLEEVE GASTROPLASTY  10/29/2016  . VARICOCELE EXCISION      Family History  Problem Relation Age of Onset  . Diabetes Mother   . Hypertension Mother   . Arthritis Mother   . Cancer Mother        uterine and breast cancer, sarcoma of the brain  . Hyperlipidemia Father   .  Arthritis Father   . Cancer Father        thyroid, prostate cancer  . Other Father        mass in stomach  . Hyperlipidemia Maternal Grandmother   . Hypertension Maternal Grandmother   . Hyperlipidemia Maternal Grandfather   . Hypertension Maternal Grandfather   . Hyperlipidemia Paternal Grandmother   . Hypertension Paternal Grandmother   . Hyperlipidemia Paternal Grandfather   . Hypertension Paternal Grandfather   . Heart attack Paternal Grandfather   . Sudden death Neg Hx   . Colon cancer Neg Hx   . Esophageal cancer Neg Hx   . Stomach cancer Neg Hx   . Rectal cancer Neg Hx     Allergies  Allergen Reactions  . Oxycodone-Acetaminophen Hives and Itching  . Zoloft [Sertraline Hcl]     ?weight gain. "made me feel weird"    Current Outpatient Medications on File Prior to Visit  Medication Sig Dispense Refill  . albuterol (PROAIR HFA) 108 (90 Base) MCG/ACT inhaler Inhale 2 puffs into the lungs every 4 (four) hours as needed for wheezing or shortness of breath. 1 Inhaler 1  . amLODipine (NORVASC) 5 MG tablet Take 1 tablet (5 mg total) by mouth daily. 30 tablet 3  . betamethasone dipropionate (DIPROLENE) 0.05 % cream Apply to area inside nose once daily. 15 g 0  . Blood Glucose Monitoring Suppl (ACCU-CHEK GUIDE) w/Device KIT 1 each by Does not apply route daily. 1 kit 0  . buPROPion (WELLBUTRIN XL) 300 MG 24 hr tablet Take 1 tablet (300 mg total) by mouth daily. (Patient taking differently: Take 150 mg by mouth daily. )    . CALCIUM-MAGNESIUM-VITAMIN D PO Take 1 tablet by mouth 3 (three) times daily.    . Cobalamine Combinations (G-81 + FOLIC ACID PO) Take by mouth. 1/2 dropper ful once a day.    . fexofenadine (ALLEGRA) 180 MG tablet Take 1 tablet (180 mg total) by mouth daily as needed for allergies or rhinitis. 30 tablet 2  . fluticasone (FLONASE) 50 MCG/ACT nasal spray Place 2 sprays into both nostrils daily as needed for allergies or rhinitis. 16 g 5  . hydrALAZINE (APRESOLINE)  50 MG tablet TAKE 1 TABLET THREE TIMES A DAY 270 tablet 1  . Lancets (FREESTYLE) lancets USE TO CHECK BLOOD SUGAR ONCE DAILY 100  each 1  . levocetirizine (XYZAL) 5 MG tablet One or two tablets in the morning and one or two tablets in the evening as needed.1 120 tablet 5  . losartan (COZAAR) 50 MG tablet Take 1 tablet (50 mg total) by mouth daily. 30 tablet 5  . metoprolol succinate (TOPROL XL) 100 MG 24 hr tablet Take 1 tablet (100 mg total) by mouth daily. Take with or immediately following a meal. 90 tablet 1  . Multiple Vitamin (MULTI-VITAMINS) TABS Take 1 tablet by mouth daily.    . mupirocin ointment (BACTROBAN) 2 % Place 1 application into the nose 2 (two) times daily. 22 g 0  . omeprazole (PRILOSEC) 20 MG capsule TAKE 2 CAPSULES EVERY MORNING 180 capsule 4  . polyethylene glycol (MIRALAX / GLYCOLAX) packet     . QUEtiapine (SEROQUEL) 25 MG tablet Take 1 tablet (25 mg total) by mouth at bedtime. 90 tablet 0  . sildenafil (VIAGRA) 100 MG tablet TAKE 1/2 TO 1 TABLET BY MOUTH DAILY AS NEEDED 15 tablet 1  . triamcinolone ointment (KENALOG) 0.1 % Apply to red itchy areas only below the face 80 g 3  . VYVANSE 20 MG capsule     . [DISCONTINUED] potassium chloride (K-DUR) 10 MEQ tablet Take 1 tablet (10 mEq total) by mouth daily. 30 tablet 1   No current facility-administered medications on file prior to visit.     BP (!) 157/85 (BP Location: Right Arm, Patient Position: Sitting, Cuff Size: Large)   Pulse 69   Temp 98.2 F (36.8 C) (Oral)   Resp 16   Ht '6\' 1"'$  (1.854 m)   Wt 293 lb (132.9 kg)   SpO2 100%   BMI 38.66 kg/m    Objective:   Physical Exam Constitutional:      General: He is not in acute distress.    Appearance: He is well-developed.  HENT:     Head: Normocephalic and atraumatic.  Cardiovascular:     Rate and Rhythm: Normal rate and regular rhythm.     Heart sounds: No murmur.  Pulmonary:     Effort: Pulmonary effort is normal. No respiratory distress.     Breath  sounds: Normal breath sounds. No wheezing or rales.  Skin:    General: Skin is warm and dry.  Neurological:     Mental Status: He is alert and oriented to person, place, and time.  Psychiatric:        Behavior: Behavior normal.        Thought Content: Thought content normal.           Assessment & Plan:  HTN-blood pressure is elevated today.  Patient reports he has not been taking hydralazine.  Advised patient to restart hydralazine.  Diabetes type 2- he has not been eating healthy and has been eating large amounts of sweets.  We will obtain A1c.  Patient was counseled on diet.  I suspect that his Seroquel is contributing to his increased hunger and weight gain and I have advised him to discuss this with his prescriber at the [place].  Hyperlipidemia-tolerating statin will obtain follow-up lipid panel.  Obstructive sleep apnea-reports noncompliance with CPAP.  I have encouraged patient to restart CPAP.  Depression/anxiety-reports mood is stable.  Management per psychiatry at the [facility].  ADHD-reports that he has been out of his Vyvanse for the last 2 weeks.  This is being managed by the [place].

## 2018-06-16 ENCOUNTER — Telehealth: Payer: Self-pay

## 2018-06-16 NOTE — Telephone Encounter (Signed)
pts insurance will not cover the levocetirizine, but will cover fexofenidine, cetirizine, loratainde

## 2018-06-17 ENCOUNTER — Other Ambulatory Visit: Payer: Self-pay

## 2018-06-17 MED ORDER — CETIRIZINE HCL 10 MG PO TABS: 10.0000 mg | ORAL_TABLET | Freq: Two times a day (BID) | ORAL | 5 refills | Status: AC

## 2018-06-17 NOTE — Telephone Encounter (Signed)
Sent in zyrtec

## 2018-06-17 NOTE — Telephone Encounter (Signed)
I looks like I push Zyrtec in my plan. I'm not sure where the Xyzal popped up. In any case, please send in 20mg  BID so that he will have enough.   Salvatore Marvel, MD [facility] [place] [facility] [place]

## 2018-06-30 ENCOUNTER — Encounter: Payer: Self-pay | Admitting: Family Medicine

## 2018-06-30 ENCOUNTER — Other Ambulatory Visit: Payer: Self-pay

## 2018-06-30 ENCOUNTER — Ambulatory Visit (INDEPENDENT_AMBULATORY_CARE_PROVIDER_SITE_OTHER): Admitting: Family Medicine

## 2018-06-30 VITALS — BP 138/82 | HR 71 | Temp 98.2°F | Ht 73.0 in | Wt 297.0 lb

## 2018-06-30 DIAGNOSIS — M79641 Pain in right hand: Secondary | ICD-10-CM

## 2018-06-30 MED ORDER — PREDNISONE 20 MG PO TABS: 40.0000 mg | ORAL_TABLET | Freq: Every day | ORAL | 0 refills | Status: AC

## 2018-06-30 MED ORDER — ALBUTEROL SULFATE 108 (90 BASE) MCG/ACT IN AEPB: 1.0000 | INHALATION_SPRAY | Freq: Four times a day (QID) | RESPIRATORY_TRACT | 2 refills | Status: AC | PRN

## 2018-06-30 MED FILL — ALBUTEROL SULFATE HFA 108 (: 108 (90 BAS | 25 days supply | Qty: 9 | Fill #0

## 2018-06-30 MED FILL — predniSONE 20 MG TABS: 20 | 5 days supply | Qty: 10 | Fill #0

## 2018-06-30 NOTE — Progress Notes (Addendum)
Musculoskeletal Exam  Patient: Charles Nash. DOB: 1966-03-29  DOS: 06/30/2018  SUBJECTIVE:  Chief Complaint:   Chief Complaint  Patient presents with  . Hand Pain    right middle finger stiffness    [name]. is a 52 y.o.  male for evaluation and treatment of R middle finger pain.   Onset:  2 days ago. 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 Location: R middle prox phalanx and radiates prox Character:  sharp  Progression of issue:  is unchanged Associated symptoms: swelling, decreased ROM Denies fevers, redness, bruising. Treatment: to date has been Biofreeze.   Neurovascular symptoms: no  ROS: Musculoskeletal/Extremities: +finger pain  Past Medical History:  Diagnosis Date  . ADHD (attention deficit hyperactivity disorder)   . Allergy   . Arthritis   . Bilateral varicoceles   . Depression   . Fatty liver 08/06/2011  . Genital herpes 10/25/2014  . GERD (gastroesophageal reflux disease)   . Hyperlipidemia   . Hypertension   . OSA (obstructive sleep apnea)   . Personal history of colonic polyps   . Plantar fasciitis   . PTSD (post-traumatic stress disorder)   . Sleep apnea     Objective: VITAL SIGNS: BP 138/82 (BP Location: Left Arm, Patient Position: Sitting, Cuff Size: Large)   Pulse 71   Temp 98.2 F (36.8 C) (Oral)   Ht 6\' 1"  (1.854 m)   Wt 297 lb (134.7 kg)   SpO2 96%   BMI 39.18 kg/m  Constitutional: Well formed, well developed. No acute distress. Cardiovascular: Brisk cap refill Thorax & Lungs: No accessory muscle use Musculoskeletal: R hand.   Normal active range of motion: no.   Normal passive range of motion: no Tenderness to palpation: yes over 3rd flexor tendon Deformity: no Ecchymosis: no Neurologic: Normal sensory function. No focal deficits noted. Psychiatric: Normal mood. Age appropriate judgment and insight. Alert & oriented x 3.    Assessment:  Pain of right hand - Plan: predniSONE  (DELTASONE) 20 MG tablet  Plan: Orders as above. Ice. Stretch area. I think he strained his flexor tendon. May need OT if no improvement. F/u prn. The patient voiced understanding and agreement to the plan.   Lorenzo, DO 06/30/18  10:28 AM

## 2018-06-30 NOTE — Patient Instructions (Signed)
Ice/cold pack over area for 10-15 min twice daily.  Stretch the finger gently, hold for 30 seconds.   Let us know if you need anything.

## 2018-07-01 ENCOUNTER — Encounter

## 2018-07-01 ENCOUNTER — Ambulatory Visit: Admitting: Physical Medicine & Rehabilitation

## 2018-07-29 ENCOUNTER — Ambulatory Visit: Admitting: Physical Medicine & Rehabilitation

## 2018-07-29 ENCOUNTER — Ambulatory Visit

## 2018-08-30 ENCOUNTER — Ambulatory Visit

## 2018-08-30 ENCOUNTER — Encounter: Attending: Physical Medicine & Rehabilitation | Admitting: Physical Medicine & Rehabilitation

## 2018-08-30 ENCOUNTER — Other Ambulatory Visit: Payer: Self-pay

## 2018-08-30 VITALS — BP 147/88 | HR 74 | Temp 98.4°F | Ht 73.0 in | Wt 299.0 lb

## 2018-08-30 DIAGNOSIS — E119 Type 2 diabetes mellitus without complications: Secondary | ICD-10-CM | POA: Diagnosis not present

## 2018-08-30 DIAGNOSIS — G4733 Obstructive sleep apnea (adult) (pediatric): Secondary | ICD-10-CM | POA: Diagnosis not present

## 2018-08-30 DIAGNOSIS — Z9989 Dependence on other enabling machines and devices: Secondary | ICD-10-CM | POA: Insufficient documentation

## 2018-08-30 DIAGNOSIS — M533 Sacrococcygeal disorders, not elsewhere classified: Secondary | ICD-10-CM | POA: Diagnosis not present

## 2018-08-30 NOTE — Patient Instructions (Signed)
Sacroiliac nerve block on RIght   Please monitor your pain on the right side.  We are looking for at least half of your usual pain to subside.  The duration of pain relieft may be from hours to weeks.

## 2018-08-30 NOTE — Progress Notes (Signed)
L5 dorsal ramus S1-S2-S3 lateral branch blocks under fluoroscopic guidance Right  side  Informed consent was obtained after describing risks and benefits of the procedure with patient these include bleeding bruising and infection.  He elects to proceed and has given written consent.  Patient placed prone on fluoroscopy table Betadine prep sterile drape a 25-gauge 1.5 inch needle was used to anesthetize skin and subcu tissue with 1% lidocaine 1 cc into each of 4 sites.  Then a 22-gauge 5" needle was inserted under fluoroscopic guidance for starting the S1 SAP sacral ala junction.  Bone contact made.  Isovue 200 x 0.5 mL demonstrated no intravascular uptake then 0.5 mL of 2% lidocaine was injected.  Then the lateral aspect of the S1, S2, S3 foramen was targeted.  Bone contact made out, Isovue-200 times 0.5 mL demonstrated no nerve root or intravascular uptake within 0.5 mL of 2% lidocaine solution was injected after negative drawback for blood.  Patient tolerated procedure well.  Postinjection instructions given.

## 2018-08-30 NOTE — Progress Notes (Signed)
  PROCEDURE RECORD  Physical Medicine and Rehabilitation   Name: Charles Nash. DOB:05-Sep-1965 MRN: [id number]  Date:08/30/2018  Physician: Alysia Penna, MD    Nurse/CMA: Monike Bragdon CMA Allergies:  Allergies  Allergen Reactions  . Oxycodone-Acetaminophen Hives and Itching  . Zoloft [Sertraline Hcl]     ?weight gain. "made me feel weird"    Consent Signed: Yes.    Is patient diabetic? No.  CBG today? NA  Pregnant: No. LMP: No LMP for male patient. (age 52-55)  Anticoagulants: no Anti-inflammatory: no Antibiotics: no  Procedure: right sac inj Position: Prone   Start Time: 1130am  End Time: 1135  Fluoro Time: 33s  RN/CMA Teja Judice CMA Aino Heckert CMA    Time 1112am 1140am    BP 147/88 148/85    Pulse 74 75    Respirations 16 16    O2 Sat 97 95    S/S 6 6    Pain Level 3/10 0/10     D/C home with son, patient A & O X 3, D/C instructions reviewed, and sits independently.

## 2018-09-10 ENCOUNTER — Ambulatory Visit (INDEPENDENT_AMBULATORY_CARE_PROVIDER_SITE_OTHER): Admitting: Family

## 2018-09-10 ENCOUNTER — Other Ambulatory Visit: Payer: Self-pay

## 2018-09-10 DIAGNOSIS — M25521 Pain in right elbow: Secondary | ICD-10-CM

## 2018-09-10 DIAGNOSIS — E785 Hyperlipidemia, unspecified: Secondary | ICD-10-CM

## 2018-09-10 DIAGNOSIS — F329 Major depressive disorder, single episode, unspecified: Secondary | ICD-10-CM | POA: Diagnosis not present

## 2018-09-10 DIAGNOSIS — E119 Type 2 diabetes mellitus without complications: Secondary | ICD-10-CM

## 2018-09-10 DIAGNOSIS — F32A Depression, unspecified: Secondary | ICD-10-CM

## 2018-09-10 DIAGNOSIS — I10 Essential (primary) hypertension: Secondary | ICD-10-CM

## 2018-09-10 NOTE — Progress Notes (Signed)
Virtual Visit via Video Note  I connected with Charles Nash on 09/10/18 at  7:00 AM EDT by a video enabled telemedicine application and verified that I am speaking with the correct person using two identifiers. This visit type was conducted due to national recommendations for restrictions regarding the COVID-19 Pandemic (e.g. social distancing).  This format is felt to be most appropriate for this patient at this time.   I discussed the limitations of evaluation and management by telemedicine and the availability of in person appointments. The patient expressed understanding and agreed to proceed.  Only the patient and myself were on today's video visit. The patient was at home and I was in my office at the time of today's visit.   History of Present Illness:  Patient is a 53 yr old male who presents today for routine follow up.  HTN- reports bp has been stable. Denies LE edema.  Hyperlipidemia- continues lipitor. Denies myalgia.  Lab Results  Component Value Date   CHOL 175 06/11/2018   HDL 56.80 06/11/2018   LDLCALC 98 06/11/2018   TRIG 100.0 06/11/2018   CHOLHDL 3 06/11/2018   Reports right elbow pain since February. 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   Reports that his weight is 293 Wt Readings from Last 3 Encounters:  08/30/18 299 lb (135.6 kg)  06/30/18 297 lb (134.7 kg)  06/11/18 293 lb (132.9 kg)   Lab Results  Component Value Date   HGBA1C 6.1 06/11/2018   HGBA1C 6.1 03/10/2018   HGBA1C 6.1 12/07/2017   Lab Results  Component Value Date   MICROALBUR 2.3 (H) 01/26/2015   [place] 98 06/11/2018   CREATININE 0.86 06/11/2018    Past Medical History:  Diagnosis Date  . ADHD (attention deficit hyperactivity disorder)   . Allergy   . Arthritis   . Bilateral varicoceles   . Depression   . Fatty liver 08/06/2011  . Genital herpes  10/25/2014  . GERD (gastroesophageal reflux disease)   . Hyperlipidemia   . Hypertension   . OSA (obstructive sleep apnea)   . Personal history of colonic polyps   . Plantar fasciitis   . PTSD (post-traumatic stress disorder)   . Sleep apnea      Social History   Socioeconomic History  . Marital status: Divorced    Spouse name: Not on file  . Number of children: 2  . Years of education: Not on file  . Highest education level: Not on file  Occupational History  . Occupation: Astronomer: [employer]  Social Needs  . Financial resource strain: Not on file  . Food insecurity:    Worry: Not on file    Inability: Not on file  . Transportation needs:    Medical: Not on file    Non-medical: Not on file  Tobacco Use  . Smoking status: Never Smoker  . Smokeless tobacco: Never Used  Substance and Sexual Activity  . Alcohol use: No    Alcohol/week: 0.0 standard drinks  . Drug use: No  . Sexual activity: Not on file  Lifestyle  . Physical activity:    Days per week: Not on file    Minutes per session: Not on file  . Stress: Not on file  Relationships  . Social connections:    Talks on phone: Not  on file    Gets together: Not on file    Attends religious service: Not on file    Active member of club or organization: Not on file    Attends meetings of clubs or organizations: Not on file    Relationship status: Not on file  . Intimate partner violence:    Fear of current or ex partner: Not on file    Emotionally abused: Not on file    Physically abused: Not on file    Forced sexual activity: Not on file  Other Topics Concern  . Not on file  Social History Narrative   Regular exercise:  No   Caffeine use:  2--32oz cups daily       Past Surgical History:  Procedure Laterality Date  . ANKLE SURGERY    . DOPPLER ECHOCARDIOGRAPHY  2010  . FOOT SURGERY    . KNEE SURGERY  08/04/08   Right knee-- medial meniscus repair, attenuation anterior cruciate  [name] [facility])   . NM MYOVIEW LTD  2010  . PLANTAR FASCIA RELEASE    . SHOULDER SURGERY    . SLEEVE GASTROPLASTY  10/29/2016  . VARICOCELE EXCISION      Family History  Problem Relation Age of Onset  . Diabetes Mother   . Hypertension Mother   . Arthritis Mother   . Cancer Mother        uterine and breast cancer, sarcoma of the brain  . Hyperlipidemia Father   . Arthritis Father   . Cancer Father        thyroid, prostate cancer  . Other Father        mass in stomach  . Hyperlipidemia Maternal Grandmother   . Hypertension Maternal Grandmother   . Hyperlipidemia Maternal Grandfather   . Hypertension Maternal Grandfather   . Hyperlipidemia Paternal Grandmother   . Hypertension Paternal Grandmother   . Hyperlipidemia Paternal Grandfather   . Hypertension Paternal Grandfather   . Heart attack Paternal Grandfather   . Sudden death Neg Hx   . Colon cancer Neg Hx   . Esophageal cancer Neg Hx   . Stomach cancer Neg Hx   . Rectal cancer Neg Hx     Allergies  Allergen Reactions  . Oxycodone-Acetaminophen Hives and Itching  . Zoloft [Sertraline Hcl]     ?weight gain. "made me feel weird"    Current Outpatient Medications on File Prior to Visit  Medication Sig Dispense Refill  . Albuterol Sulfate 108 (90 Base) MCG/ACT AEPB Inhale 1-2 puffs into the lungs every 6 (six) hours as needed (wheezing, sob). 1 each 2  . atorvastatin (LIPITOR) 10 MG tablet Take 1 tablet (10 mg total) by mouth daily. 90 tablet 1  . betamethasone dipropionate (DIPROLENE) 0.05 % cream Apply to area inside nose once daily. 15 g 0  . Blood Glucose Monitoring Suppl (ACCU-CHEK GUIDE) w/Device KIT 1 each by Does not apply route daily. 1 kit 0  . buPROPion (WELLBUTRIN XL) 300 MG 24 hr tablet Take 1 tablet (300 mg total) by mouth daily. (Patient taking differently: Take 150 mg by mouth daily. )    . CALCIUM-MAGNESIUM-VITAMIN D PO Take 1 tablet by mouth 3 (three) times daily.    .  cetirizine (ZYRTEC) 10 MG tablet Take 1 tablet (10 mg total) by mouth 2 (two) times daily. 60 tablet 5  . Cobalamine Combinations (O-13 + FOLIC ACID PO) Take by mouth. 1/2 dropper ful once a day.    . fexofenadine (  ALLEGRA) 180 MG tablet Take 1 tablet (180 mg total) by mouth daily as needed for allergies or rhinitis. 30 tablet 2  . fluticasone (FLONASE) 50 MCG/ACT nasal spray Place 2 sprays into both nostrils daily as needed for allergies or rhinitis. 16 g 5  . hydrALAZINE (APRESOLINE) 50 MG tablet TAKE 1 TABLET THREE TIMES A DAY 270 tablet 1  . Lancets (FREESTYLE) lancets USE TO CHECK BLOOD SUGAR ONCE DAILY 100 each 1  . levocetirizine (XYZAL) 5 MG tablet One or two tablets in the morning and one or two tablets in the evening as needed.1 120 tablet 5  . losartan (COZAAR) 50 MG tablet Take 1 tablet (50 mg total) by mouth daily. 30 tablet 5  . metoprolol succinate (TOPROL XL) 100 MG 24 hr tablet Take 1 tablet (100 mg total) by mouth daily. Take with or immediately following a meal. 90 tablet 1  . Multiple Vitamin (MULTI-VITAMINS) TABS Take 1 tablet by mouth daily.    . mupirocin ointment (BACTROBAN) 2 % Place 1 application into the nose 2 (two) times daily. 22 g 0  . omeprazole (PRILOSEC) 20 MG capsule TAKE 2 CAPSULES EVERY MORNING 180 capsule 4  . polyethylene glycol (MIRALAX / GLYCOLAX) packet     . QUEtiapine (SEROQUEL) 25 MG tablet Take 1 tablet (25 mg total) by mouth at bedtime. 90 tablet 0  . sildenafil (VIAGRA) 100 MG tablet TAKE 1/2 TO 1 TABLET BY MOUTH DAILY AS NEEDED 15 tablet 1  . triamcinolone ointment (KENALOG) 0.1 % Apply to red itchy areas only below the face 80 g 3  . VYVANSE 20 MG capsule     . [DISCONTINUED] amLODipine (NORVASC) 5 MG tablet Take 1 tablet (5 mg total) by mouth daily. 30 tablet 3  . [DISCONTINUED] potassium chloride (K-DUR) 10 MEQ tablet Take 1 tablet (10 mEq total) by mouth daily. 30 tablet 1   No current facility-administered medications on file prior to visit.      There were no vitals taken for this visit.   Observations/Objective:  Gen: Awake, alert, no acute distress Resp: Breathing is even and non-labored Psych: calm/pleasant demeanor Neuro: Alert and Oriented x 3, + facial symmetry, speech is clear.   Assessment and Plan:  HTN- reports good blood pressures at home. He will send me his bp reading via mychart. Continue current meds.  DM2- last a1c was at goal. He is working on [employer]. Plan a1c next visit at his face to face appointment.  Hyperlipidemia- LDL at goal.  R elbow pain- new. Will refer to sports medicine for further evaluation.  Depression- stable. Following with psychiatry at the [place].   Follow Up Instructions:    I discussed the assessment and treatment plan with the patient. The patient was provided an opportunity to ask questions and all were answered. The patient agreed with the plan and demonstrated an understanding of the instructions.   The patient was advised to call back or seek an in-person evaluation if the symptoms worsen or if the condition fails to improve as anticipated.    Nance Pear, NP

## 2018-09-20 ENCOUNTER — Ambulatory Visit: Admitting: Family Medicine

## 2018-09-30 ENCOUNTER — Encounter: Admitting: Physical Medicine & Rehabilitation

## 2018-10-26 ENCOUNTER — Ambulatory Visit (INDEPENDENT_AMBULATORY_CARE_PROVIDER_SITE_OTHER): Admitting: Allergy & Immunology

## 2018-10-26 ENCOUNTER — Encounter: Payer: Self-pay | Admitting: Allergy & Immunology

## 2018-10-26 ENCOUNTER — Other Ambulatory Visit: Payer: Self-pay

## 2018-10-26 VITALS — BP 136/98 | HR 77 | Temp 97.3°F | Resp 14 | Ht 73.0 in | Wt 300.6 lb

## 2018-10-26 DIAGNOSIS — J3089 Other allergic rhinitis: Secondary | ICD-10-CM

## 2018-10-26 DIAGNOSIS — L508 Other urticaria: Secondary | ICD-10-CM

## 2018-10-26 DIAGNOSIS — J4520 Mild intermittent asthma, uncomplicated: Secondary | ICD-10-CM

## 2018-10-26 MED ORDER — LEVOCETIRIZINE DIHYDROCHLORIDE 5 MG PO TABS: ORAL_TABLET | ORAL | 5 refills | Status: DC

## 2018-10-26 MED ORDER — TRIAMCINOLONE ACETONIDE 0.1 % EX OINT: TOPICAL_OINTMENT | CUTANEOUS | 3 refills | Status: DC

## 2018-10-26 NOTE — Progress Notes (Signed)
FOLLOW UP  Date of Service/Encounter:  10/26/18   Assessment:   Chronic urticaria - controlled with PRN use of Xyzal  Mild intermittent asthma without complication  Seasonal and perennial allergic rhinitis (grasses, weeds, multiple trees, indoor and outdoor molds, tobacco)  Plan/Recommendations:   1. Chronic urticaria - I do not think that you need the lab work today since these seem to have cleared. - Continue with Xyzal 5mg  as needed. - Continue with triamcinolone as needed.   2. Intermittent asthma - Continue with albuterol 2-4 puffs as needed. - You can also use a couple of puffs before physical activity to see if this helps with your exercise tolerance. - There is no need for a controller medication at this time.  3. Seasonal and perennial allergic rhinitis - Continue with the fluticasone as needed. - Continue with Xyzal as needed.  4. Return in about 1 year (around 10/26/2019). This can be an in-person, a virtual Webex or a telephone follow up visit.   Subjective:   Charles Nash. is a 53 y.o. male presenting today for follow up of  Chief Complaint  Patient presents with  . Follow-up    URTICARIA    Charles Nash. has a history of the following: Patient Active Problem List   Diagnosis Date Noted  . Myofascial pain dysfunction syndrome 01/02/2017  . Insect bites 11/10/2016  . Diabetic polyneuropathy associated with type 2 diabetes mellitus ([place]) 08/25/2016  . Achilles tendinitis, left leg 08/25/2016  . Achilles tendon contracture, left 08/25/2016  . Xerosis of skin 04/22/2016  . Chronic urticaria 03/05/2016  . Sacroiliac joint dysfunction of both sides 01/01/2016  . Anxiety state 08/20/2015  . 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 Patellofemoral disorder 05/15/2015  . Current use of beta blocker 05/15/2015  . Genital herpes 10/25/2014  . Depression 10/05/2014  . Osteoarthritis of left knee 08/15/2014   . 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 Sciatica 04/22/2014  . Osteoarthritis of right knee 02/06/2014  . Allergy to mold 12/13/2013  . Weight gain, abnormal 11/08/2013  . PTSD (post-traumatic stress disorder) 07/05/2013  . Multinodular goiter 01/19/2013  . GERD (gastroesophageal reflux disease) 08/09/2012  . Dermographia 12/29/2011  . Neck pain 11/12/2011  . Fatty liver 08/06/2011  . Cervical disc disease 07/28/2011  . Preventative health care 07/28/2011  . Erectile dysfunction 05/23/2011  . Elevated liver function tests 05/20/2011  . Type II diabetes mellitus, well controlled ([place]) 05/19/2011  . Essential hypertension 05/19/2011  . Hyperlipidemia 05/19/2011  . Morbid obesity ([place]) 05/19/2011  . Other allergic rhinitis 05/19/2011  . Obstructive sleep apnea treated with continuous positive airway pressure (CPAP) 05/19/2011    History obtained from: chart review and patient.  Charles Nash is a 53 y.o. male presenting for a follow up visit.  He was last seen in January 2020.  At that time, his lung testing looked great.  We continued albuterol as needed.  For his urticaria, we did obtain some labs.  We also started him on suppressive doses of antihistamines including Zyrtec up to 20 mg twice daily.  It does not seem that these labs were ever collected.  Since last visit, he has done very well.  His hives actually cleared up on their own.  He is now using a Xyzal once a day if needed.  He has not needed prednisone for his urticaria since last visit.  He never did obtain the labs.  He denies any throat swelling or other systemic  symptoms with the hives.  From an asthma perspective, he is doing well.  He does use the albuterol with biking, which he is trying to do more of.  He reports a 40 pound weight gain during the pandemic, so he is trying to get on the bike at least once a day.  Otherwise, he has no symptoms of asthma.  He has not been on prednisone for his breathing.  He has not required any ER  visits or urgent care visits for his breathing.  He endorses good nighttime sleep.  His allergies been controlled with fluticasone as needed. His last testing was performed in October 2013.  At that time, he was positive to [place] grass, one weed, multiple trees, multiple indoor and outdoor molds, and tobacco leaf.   Otherwise, there have been no changes to his past medical history, surgical history, family history, or social history.  He is currently retired.  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  He was having quite a bit of shortness of breath episodes at this job, and he was laid off at the beginning of the pandemic.  He was a history Pharmacist, hospital and coach prior to that and was a member of the [employer].    Review of Systems  Constitutional: Negative.  Negative for chills, fever, malaise/fatigue and weight loss.  HENT: Negative.  Negative for congestion, ear discharge, ear pain and sore throat.   Eyes: Negative for pain, discharge and redness.  Respiratory: Negative for cough, sputum production, shortness of breath and wheezing.   Cardiovascular: Negative.  Negative for chest pain and palpitations.  Gastrointestinal: Negative for abdominal pain, heartburn, nausea and vomiting.  Skin: Negative.  Negative for itching and rash.  Neurological: Negative for dizziness and headaches.  Endo/Heme/Allergies: Negative for environmental allergies. Does not bruise/bleed easily.       Objective:   Blood pressure (!) 136/98, pulse 77, temperature (!) 97.3 F (36.3 C), resp. rate 14, height 6\' 1"  (1.854 m), weight (!) 300 lb 9.6 oz (136.4 kg), SpO2 97 %. Body mass index is 39.66 kg/m.   Physical Exam:  Physical Exam  Constitutional: He appears well-developed.  Obese male.  HENT:  Head: Normocephalic and atraumatic.  Right Ear: Tympanic membrane, external ear and ear canal normal.  Left Ear: Tympanic membrane and ear  canal normal.  Nose: No mucosal edema, rhinorrhea, nasal deformity or septal deviation. No epistaxis. Right sinus exhibits no maxillary sinus tenderness and no frontal sinus tenderness. Left sinus exhibits no maxillary sinus tenderness and no frontal sinus tenderness.  Mouth/Throat: Uvula is midline and oropharynx is clear and moist. Mucous membranes are not pale and not dry.  Eyes: Pupils are equal, round, and reactive to light. Conjunctivae and EOM are normal. Right eye exhibits no chemosis and no discharge. Left eye exhibits no chemosis and no discharge. Right conjunctiva is not injected. Left conjunctiva is not injected.  Cardiovascular: Normal rate, regular rhythm and normal heart sounds.  Respiratory: Effort normal and breath sounds normal. No accessory muscle usage. No tachypnea. No respiratory distress. He has no wheezes. He has no rhonchi. He has no rales. He exhibits no tenderness.  Moving air well in all lung fields.  Lymphadenopathy:    He has no cervical adenopathy.  Neurological: He is alert.  Skin: No abrasion, no petechiae and no rash noted. Rash is not papular, not vesicular and not urticarial. No erythema. No pallor.  No eczematous  or urticarial lesions noted.  Psychiatric: He has a normal mood and affect.     Diagnostic studies: none     Salvatore Marvel, MD  [facility] [facility] [place]

## 2018-10-26 NOTE — Patient Instructions (Addendum)
1. Chronic urticaria - I do not think that you need the lab work today since these seem to have cleared. - Continue with Xyzal 5mg  as needed. - Continue with triamcinolone as needed.   2. Intermittent asthma - Continue with albuterol 2-4 puffs as needed. - You can also use a couple of puffs before physical activity to see if this helps with your exercise tolerance. - There is no need for a controller medication at this time.  3. Seasonal and perennial allergic rhinitis - Continue with the fluticasone as needed. - Continue with Xyzal as needed.  4. Return in about 1 year (around 10/26/2019). This can be an in-person, a virtual Webex or a telephone follow up visit.   Please inform us of any Emergency Department visits, hospitalizations, or changes in symptoms. Call us before going to the ED for breathing or allergy symptoms since we might be able to fit you in for a sick visit. Feel free to contact us anytime with any questions, problems, or concerns.  It was a pleasure to see you again today! Stay safe!   Websites that have reliable patient information: 1. American Academy of Asthma, Allergy, and Immunology: www.aaaai.org 2. Food Allergy Research and Education (FARE): foodallergy.org 3. Mothers of Asthmatics: http://www.asthmacommunitynetwork.org 4. American College of Allergy, Asthma, and Immunology: www.acaai.org  "Like" [place] on Facebook and Instagram for our latest updates!      Make sure you are registered to vote! If you have moved or changed any of your contact information, you will need to get this updated before voting!  In some cases, you MAY be able to register to vote online: [email or web address]    Voter ID laws are NOT going into effect for the General Election in November 2020! DO NOT let this stop you from exercising your right to vote!   Absentee voting is the SAFEST way to vote during the coronavirus pandemic!   Download and print an  absentee ballot request form at [email or web address] or you can scan the QR code below with your smart phone:      More information on absentee ballots can be found here: [email or web address]

## 2018-11-02 ENCOUNTER — Ambulatory Visit: Admitting: Allergy & Immunology

## 2018-11-24 ENCOUNTER — Encounter: Payer: Self-pay | Admitting: Family

## 2018-11-25 MED ORDER — OMEPRAZOLE 20 MG PO CPDR: 20.0000 mg | DELAYED_RELEASE_CAPSULE | Freq: Two times a day (BID) | ORAL | 1 refills | Status: AC

## 2018-11-25 MED ORDER — ATORVASTATIN CALCIUM 10 MG PO TABS: 10.0000 mg | ORAL_TABLET | Freq: Every day | ORAL | 1 refills | Status: AC

## 2018-11-25 MED FILL — OMEPRAZOLE 20 MG CAP: 20 | 90 days supply | Qty: 180 | Fill #0

## 2018-11-25 MED FILL — ATORVASTATIN 10 MG TABLET: 10 | 90 days supply | Qty: 90 | Fill #0

## 2018-12-10 ENCOUNTER — Encounter: Payer: Self-pay | Admitting: Family

## 2018-12-10 ENCOUNTER — Other Ambulatory Visit: Payer: Self-pay

## 2018-12-10 ENCOUNTER — Ambulatory Visit (INDEPENDENT_AMBULATORY_CARE_PROVIDER_SITE_OTHER): Admitting: Family

## 2018-12-10 VITALS — BP 148/90 | Wt 299.0 lb

## 2018-12-10 DIAGNOSIS — E785 Hyperlipidemia, unspecified: Secondary | ICD-10-CM

## 2018-12-10 DIAGNOSIS — F329 Major depressive disorder, single episode, unspecified: Secondary | ICD-10-CM

## 2018-12-10 DIAGNOSIS — E119 Type 2 diabetes mellitus without complications: Secondary | ICD-10-CM | POA: Diagnosis not present

## 2018-12-10 DIAGNOSIS — I10 Essential (primary) hypertension: Secondary | ICD-10-CM | POA: Diagnosis not present

## 2018-12-10 DIAGNOSIS — F32A Depression, unspecified: Secondary | ICD-10-CM

## 2018-12-10 NOTE — Progress Notes (Signed)
Virtual Visit via Video Note  I connected with Charles Nash. on 12/10/18 at  7:00 AM EDT by a video enabled telemedicine application and verified that I am speaking with the correct person using two identifiers.  Location: Patient: home Provider: home   I discussed the limitations of evaluation and management by telemedicine and the availability of in person appointments. The patient expressed understanding and agreed to proceed.  History of Present Illness:  DM2- diet controlled.  Reports weight 299 Wt Readings from Last 3 Encounters:  12/10/18 299 lb (135.6 kg)  10/26/18 (!) 300 lb 9.6 oz (136.4 kg)  08/30/18 299 lb (135.6 kg)    Lab Results  Component Value Date   HGBA1C 6.1 06/11/2018   HGBA1C 6.1 03/10/2018   HGBA1C 6.1 12/07/2017   Lab Results  Component Value Date   MICROALBUR 2.3 (H) 01/26/2015   LDLCALC 98 06/11/2018   CREATININE 0.86 06/11/2018   Hyperlipidemia- maintained on lipitor. Denies myalgia.  Lab Results  Component Value Date   CHOL 175 06/11/2018   HDL 56.80 06/11/2018   LDLCALC 98 06/11/2018   TRIG 100.0 06/11/2018   CHOLHDL 3 06/11/2018    HTN- 156/98- last Saturday. Maintained on hydralazine (only taking twice daily), losartan (only taking 1/2 tablet) and metoprolol xl '100mg'$ .    BP Readings from Last 3 Encounters:  12/10/18 (!) 148/90  10/26/18 (!) 136/98  08/30/18 (!) 147/88   Depression- continues to work with psychiatry. Studying mental health counseling.     Past Medical History:  Diagnosis Date  . ADHD (attention deficit hyperactivity disorder)   . Allergy   . Arthritis   . Bilateral varicoceles   . Depression   . Fatty liver 08/06/2011  . Genital herpes 10/25/2014  . GERD (gastroesophageal reflux disease)   . Hyperlipidemia   . Hypertension   . OSA (obstructive sleep apnea)   . Personal history of colonic polyps   . Plantar fasciitis   . PTSD (post-traumatic stress disorder)   . Sleep apnea      Social History    Socioeconomic History  . Marital status: Divorced    Spouse name: Not on file  . Number of children: 2  . Years of education: Not on file  . Highest education level: Not on file  Occupational History  . Occupation: Astronomer: [employer]  Social Needs  . Financial resource strain: Not on file  . Food insecurity    Worry: Not on file    Inability: Not on file  . Transportation needs    Medical: Not on file    Non-medical: Not on file  Tobacco Use  . Smoking status: Never Smoker  . Smokeless tobacco: Never Used  Substance and Sexual Activity  . Alcohol use: No    Alcohol/week: 0.0 standard drinks  . Drug use: No  . Sexual activity: Not on file  Lifestyle  . Physical activity    Days per week: Not on file    Minutes per session: Not on file  . Stress: Not on file  Relationships  . Social Herbalist on phone: Not on file    Gets together: Not on file    Attends religious service: Not on file    Active member of club or organization: Not on file    Attends meetings of clubs or organizations: Not on file    Relationship status: Not on file  . Intimate partner violence  Fear of current or ex partner: Not on file    Emotionally abused: Not on file    Physically abused: Not on file    Forced sexual activity: Not on file  Other Topics Concern  . Not on file  Social History Narrative   Regular exercise:  No   Caffeine use:  2--32oz cups daily       Past Surgical History:  Procedure Laterality Date  . ANKLE SURGERY    . DOPPLER ECHOCARDIOGRAPHY  2010  . FOOT SURGERY    . KNEE SURGERY  08/04/08   Right knee-- medial meniscus repair, attenuation anterior cruciate [name] MD [facility])   . NM MYOVIEW LTD  2010  . PLANTAR FASCIA RELEASE    . SHOULDER SURGERY    . SLEEVE GASTROPLASTY  10/29/2016  . VARICOCELE EXCISION      Family History  Problem Relation Age of Onset  . Diabetes Mother   . Hypertension Mother    . Arthritis Mother   . Cancer Mother        uterine and breast cancer, sarcoma of the brain  . Hyperlipidemia Father   . Arthritis Father   . Cancer Father        thyroid, prostate cancer  . Other Father        mass in stomach  . Hyperlipidemia Maternal Grandmother   . Hypertension Maternal Grandmother   . Hyperlipidemia Maternal Grandfather   . Hypertension Maternal Grandfather   . Hyperlipidemia Paternal Grandmother   . Hypertension Paternal Grandmother   . Hyperlipidemia Paternal Grandfather   . Hypertension Paternal Grandfather   . Heart attack Paternal Grandfather   . Sudden death Neg Hx   . Colon cancer Neg Hx   . Esophageal cancer Neg Hx   . Stomach cancer Neg Hx   . Rectal cancer Neg Hx     Allergies  Allergen Reactions  . Oxycodone-Acetaminophen Hives and Itching  . Zoloft [Sertraline Hcl]     ?weight gain. "made me feel weird"    Current Outpatient Medications on File Prior to Visit  Medication Sig Dispense Refill  . Albuterol Sulfate 108 (90 Base) MCG/ACT AEPB Inhale 1-2 puffs into the lungs every 6 (six) hours as needed (wheezing, sob). 1 each 2  . atorvastatin (LIPITOR) 10 MG tablet Take 1 tablet (10 mg total) by mouth daily. 90 tablet 1  . Blood Glucose Monitoring Suppl (ACCU-CHEK GUIDE) w/Device KIT 1 each by Does not apply route daily. 1 kit 0  . buPROPion (WELLBUTRIN XL) 300 MG 24 hr tablet Take 1 tablet (300 mg total) by mouth daily. (Patient taking differently: Take 150 mg by mouth daily. )    . CALCIUM-MAGNESIUM-VITAMIN D PO Take 1 tablet by mouth 3 (three) times daily.    . cetirizine (ZYRTEC) 10 MG tablet Take 1 tablet (10 mg total) by mouth 2 (two) times daily. 60 tablet 5  . Cobalamine Combinations (U-44 + FOLIC ACID PO) Take by mouth. 1/2 dropper ful once a day.    . fexofenadine (ALLEGRA) 180 MG tablet Take 1 tablet (180 mg total) by mouth daily as needed for allergies or rhinitis. 30 tablet 2  . fluticasone (FLONASE) 50 MCG/ACT nasal spray Place  2 sprays into both nostrils daily as needed for allergies or rhinitis. 16 g 5  . hydrALAZINE (APRESOLINE) 50 MG tablet TAKE 1 TABLET THREE TIMES A DAY 270 tablet 1  . Lancets (FREESTYLE) lancets USE TO CHECK BLOOD SUGAR ONCE DAILY 100 each  1  . levocetirizine (XYZAL) 5 MG tablet One or two tablets in the morning and one or two tablets in the evening as needed.1 120 tablet 5  . losartan (COZAAR) 50 MG tablet Take 1 tablet (50 mg total) by mouth daily. 30 tablet 5  . metoprolol succinate (TOPROL XL) 100 MG 24 hr tablet Take 1 tablet (100 mg total) by mouth daily. Take with or immediately following a meal. 90 tablet 1  . Multiple Vitamin (MULTI-VITAMINS) TABS Take 1 tablet by mouth daily.    Marland Kitchen omeprazole (PRILOSEC) 20 MG capsule Take 1 capsule (20 mg total) by mouth 2 (two) times daily before a meal. 180 capsule 1  . polyethylene glycol (MIRALAX / GLYCOLAX) packet     . QUEtiapine (SEROQUEL) 25 MG tablet Take 1 tablet (25 mg total) by mouth at bedtime. 90 tablet 0  . sildenafil (VIAGRA) 100 MG tablet TAKE 1/2 TO 1 TABLET BY MOUTH DAILY AS NEEDED 15 tablet 1  . triamcinolone ointment (KENALOG) 0.1 % Apply to red itchy areas only below the face 80 g 3  . VYVANSE 20 MG capsule     . [DISCONTINUED] amLODipine (NORVASC) 5 MG tablet Take 1 tablet (5 mg total) by mouth daily. 30 tablet 3  . [DISCONTINUED] potassium chloride (K-DUR) 10 MEQ tablet Take 1 tablet (10 mEq total) by mouth daily. 30 tablet 1   No current facility-administered medications on file prior to visit.     BP (!) 148/90   Wt 299 lb (135.6 kg)   BMI 39.45 kg/m    Observations/Objective:   Gen: Awake, alert, no acute distress Resp: Breathing is even and non-labored Psych: calm/pleasant demeanor Neuro: Alert and Oriented x 3, + facial symmetry, speech is clear.   Assessment and Plan:  HTN- uncontrolled. Advised pt to increase hydralazine from bid to tid.  Check blood pressure once daily for next 3 days. If bp above goal plan  to increase losartan from '25mg'$  to '50mg'$  once daily.  Hyperlipidemia- tolerating statin.  LDL at goal. Continue same.  Lab Results  Component Value Date   CHOL 175 06/11/2018   HDL 56.80 06/11/2018   LDLCALC 98 06/11/2018   TRIG 100.0 06/11/2018   CHOLHDL 3 06/11/2018   Depression- stable on current meds. Management per psychiatry.   DM2- working on diet,  Clinically stable. Obtain follow up a1c.   Follow Up Instructions:    I discussed the assessment and treatment plan with the patient. The patient was provided an opportunity to ask questions and all were answered. The patient agreed with the plan and demonstrated an understanding of the instructions.   The patient was advised to call back or seek an in-person evaluation if the symptoms worsen or if the condition fails to improve as anticipated.  Nance Pear, NP

## 2018-12-10 NOTE — Patient Instructions (Signed)
Please increase your hydralazine to 3 times daily. Check blood pressure once daily for next 3 days and send me your readings via mychart.

## 2018-12-14 ENCOUNTER — Other Ambulatory Visit

## 2018-12-23 ENCOUNTER — Telehealth: Payer: Self-pay

## 2018-12-23 NOTE — Telephone Encounter (Signed)
I do not see the MRI.  Was it done at the [place] system.  I do not have access to that they would have to send me a copy so I could review the report.

## 2018-12-23 NOTE — Telephone Encounter (Signed)
Patient called stating that [facility] denied his procedure because they wanted him to get an MRI. He did get the MRI and he is requesting the results and what to do next.

## 2018-12-24 NOTE — Telephone Encounter (Signed)
Okay 

## 2018-12-24 NOTE — Telephone Encounter (Signed)
He states he had it done at [facility]. He states has results and he will fax them over.

## 2019-01-05 ENCOUNTER — Encounter: Attending: Physical Medicine & Rehabilitation | Admitting: Physical Medicine & Rehabilitation

## 2019-01-05 ENCOUNTER — Other Ambulatory Visit: Payer: Self-pay

## 2019-01-05 DIAGNOSIS — M5416 Radiculopathy, lumbar region: Secondary | ICD-10-CM

## 2019-01-05 NOTE — Patient Instructions (Signed)

## 2019-01-05 NOTE — Progress Notes (Signed)
  PROCEDURE RECORD [facility]   Name: Charles Nash. DOB:[birth year] MRN: [id number]  Date:01/05/2019  Physician: Alysia Penna, MD    Nurse/CMA: Betti Cruz  Allergies:  Allergies  Allergen Reactions  . Oxycodone-Acetaminophen Hives and Itching  . Zoloft [Sertraline Hcl]     ?weight gain. "made me feel weird"    Consent Signed: Yes.    Is patient diabetic? No.  CBG today?   Pregnant: No. LMP: No LMP for male patient. (age 52-55)  Anticoagulants: no Anti-inflammatory: no Antibiotics: no  Procedure: L5 Epidural Steroid Injection Position: Prone Start Time: 10:16am End Time:10:22am  Fluoro Time: 36  RN/CMA Jaionna Weisse,CMA Romanita Fager,CMA    Time 9:51am 10:31am    BP 149/90 159/90    Pulse 67 66    Respirations 16 16    O2 Sat 96 97    S/S 6 6    Pain Level 4/10 3/10     D/C home with son, patient A & O X 3, D/C instructions reviewed, and sits independently.

## 2019-01-05 NOTE — Progress Notes (Signed)
RIght L5-S1 Lumbar transforaminal epidural steroid injection under fluoroscopic guidance with contrast enhancement  Indication: Lumbosacral radiculitis is not relieved by medication management or other conservative care and interfering with self-care and mobility.  EXAM:  MRI LUMBAR SPINE WITHOUT CONTRAST    TECHNIQUE:  Multiplanar, multisequence MR imaging of the lumbar spine was  performed. No intravenous contrast was administered.    COMPARISON: MRI of the lumbar spine 01/29/2012    FINDINGS:  Segmentation: 5 non rib-bearing lumbar type vertebral bodies are  present. The lowest fully formed vertebral body is L5.    Alignment: Normal lumbar lordosis is present. Slight anterolisthesis  is present at L4-5.    Vertebrae: Marrow signal and vertebral body heights are normal.    Conus medullaris and cauda equina: Conus extends to the L2 level.  Conus and cauda equina appear normal.    Paraspinal and other soft tissues: Limited imaging the abdomen is  unremarkable. There is no significant adenopathy. No solid organ  lesions are present.    Disc levels:    L1-2: Normal    L2-3: There is some desiccation of the disc without focal protrusion  or stenosis.    L3-4: There is desiccation of the disc without significant disc  protrusion or stenosis.    L4-5: A far left lateral disc protrusion extends into the foramen.  Mild left foraminal narrowing is present. The central canal is  patent. Mild facet hypertrophy is present bilaterally, progressed  since 2013.    L5-S1: A broad-based disc protrusion is asymmetric to the right.  Mild foraminal narrowing, right greater than left, is similar the  prior study. There is slight progression of mild facet hypertrophy.    IMPRESSION:  1. Far left lateral disc protrusion at L4-5 results in mild left  foraminal stenosis.  2. Mild foraminal narrowing bilaterally at L5-S1 is stable, right   greater than left.  3. Slight progression of bilateral facet hypertrophy at L4-5.      Electronically Signed  By: San Morelle M.D.  On: 12/19/2018 17:30     Informed consent was obtained after describing risk and benefits of the procedure with the patient, this includes bleeding, bruising, infection, paralysis and medication side effects.  The patient wishes to proceed and has given written consent.  Patient was placed in prone position.  The lumbar area was marked and prepped with Betadine.  It was entered with a 25-gauge 1-1/2 inch needle and one mL of 1% lidocaine was injected into the skin and subcutaneous tissue.  Then a 22-gauge 5" spinal needle was inserted into the RIght L5-S1 intervertebral foramen under AP, lateral, and oblique view.  Once needle tip was within the foramen on lateral views an dnor exceeding 6 o clock position on th epedical on AP viewed Isovue 200 was inected x 72ml Then a solution containing one mL of 10 mg per mL dexamethasone and 2 mL of 1% lidocaine was injected.  The patient tolerated procedure well.  Post procedure instructions were given.  Please see post procedure form.

## 2019-01-07 ENCOUNTER — Other Ambulatory Visit: Payer: Self-pay

## 2019-01-07 ENCOUNTER — Emergency Department (HOSPITAL_BASED_OUTPATIENT_CLINIC_OR_DEPARTMENT_OTHER)

## 2019-01-07 ENCOUNTER — Emergency Department (HOSPITAL_BASED_OUTPATIENT_CLINIC_OR_DEPARTMENT_OTHER)
Admission: EM | Admit: 2019-01-07 | Discharge: 2019-01-08 | Disposition: A | Discharge status: Home / Self Care | Attending: Emergency Medicine | Admitting: Emergency Medicine

## 2019-01-07 ENCOUNTER — Encounter (HOSPITAL_BASED_OUTPATIENT_CLINIC_OR_DEPARTMENT_OTHER): Payer: Self-pay

## 2019-01-07 DIAGNOSIS — R200 Anesthesia of skin: Secondary | ICD-10-CM | POA: Insufficient documentation

## 2019-01-07 DIAGNOSIS — F909 Attention-deficit hyperactivity disorder, unspecified type: Secondary | ICD-10-CM | POA: Diagnosis not present

## 2019-01-07 DIAGNOSIS — E1142 Type 2 diabetes mellitus with diabetic polyneuropathy: Secondary | ICD-10-CM | POA: Diagnosis not present

## 2019-01-07 DIAGNOSIS — R072 Precordial pain: Secondary | ICD-10-CM | POA: Insufficient documentation

## 2019-01-07 DIAGNOSIS — I10 Essential (primary) hypertension: Secondary | ICD-10-CM | POA: Diagnosis not present

## 2019-01-07 DIAGNOSIS — Z79899 Other long term (current) drug therapy: Secondary | ICD-10-CM | POA: Insufficient documentation

## 2019-01-07 DIAGNOSIS — R079 Chest pain, unspecified: Secondary | ICD-10-CM | POA: Diagnosis present

## 2019-01-07 LAB — CBC
HCT: 46.1 % (ref 39.0–52.0)
Hemoglobin: 15 g/dL (ref 13.0–17.0)
MCH: 29.8 pg (ref 26.0–34.0)
MCHC: 32.5 g/dL (ref 30.0–36.0)
MCV: 91.7 fL (ref 80.0–100.0)
Platelets: 308 10*3/uL (ref 150–400)
RBC: 5.03 MIL/uL (ref 4.22–5.81)
RDW: 13.2 % (ref 11.5–15.5)
WBC: 9.5 10*3/uL (ref 4.0–10.5)
nRBC: 0 % (ref 0.0–0.2)

## 2019-01-07 LAB — BASIC METABOLIC PANEL
Anion gap: 11 (ref 5–15)
BUN: 15 mg/dL (ref 6–20)
CO2: 27 mmol/L (ref 22–32)
Calcium: 9.2 mg/dL (ref 8.9–10.3)
Chloride: 101 mmol/L (ref 98–111)
Creatinine, Ser: 1.07 mg/dL (ref 0.61–1.24)
GFR calc Af Amer: >60 mL/min (ref >60)
GFR calc non Af Amer: >60 mL/min (ref >60)
Glucose, Bld: 101 mg/dL — ABNORMAL HIGH (ref 70–99)
Potassium: 3.5 mmol/L (ref 3.5–5.1)
Sodium: 139 mmol/L (ref 135–145)

## 2019-01-07 LAB — TROPONIN I (HIGH SENSITIVITY): Troponin I (High Sensitivity): 4 ng/L (ref <18)

## 2019-01-07 MED ORDER — ALUM & MAG HYDROXIDE-SIMETH 200-200-20 MG/5ML PO SUSP
30.0000 mL | Freq: Once | ORAL | Status: AC
  Administered 2019-01-07: 30 mL via ORAL
  Filled 2019-01-07: qty 30

## 2019-01-07 MED ORDER — LIDOCAINE VISCOUS HCL 2 % MT SOLN
15.0000 mL | Freq: Once | OROMUCOSAL | Status: AC
  Administered 2019-01-07: 15 mL via ORAL
  Filled 2019-01-07: qty 15

## 2019-01-07 NOTE — ED Notes (Signed)
Patient transported to CT 

## 2019-01-07 NOTE — ED Triage Notes (Addendum)
Pt c/o pain to central chest and upper back started 1230pm- numbness to face and left arm started at 130pm-denies pain states numbness to left side of face and arm has continued-NAD-steady gait

## 2019-01-07 NOTE — ED Notes (Signed)
Pt c/o numbness left lip. States onset today at 1400 and also 1 episode 2 weeks. Ago. Pt denies chest pain at present.

## 2019-01-07 NOTE — ED Notes (Signed)
ED Provider at bedside. 

## 2019-01-07 NOTE — ED Notes (Signed)
Pt was given a warm blanket

## 2019-01-07 NOTE — ED Notes (Signed)
Patient transported to X-ray 

## 2019-01-08 ENCOUNTER — Encounter (HOSPITAL_BASED_OUTPATIENT_CLINIC_OR_DEPARTMENT_OTHER): Payer: Self-pay | Admitting: Emergency Medicine

## 2019-01-08 LAB — TROPONIN I (HIGH SENSITIVITY): Troponin I (High Sensitivity): 4 ng/L (ref <18)

## 2019-01-08 MED ORDER — OMEPRAZOLE 20 MG PO CPDR: 20.0000 mg | DELAYED_RELEASE_CAPSULE | Freq: Every day | ORAL | 0 refills | Status: DC

## 2019-01-08 MED ORDER — FENTANYL CITRATE (PF) 100 MCG/2ML IJ SOLN
INTRAMUSCULAR | Status: AC
  Filled 2019-01-08: qty 2

## 2019-01-08 NOTE — ED Provider Notes (Signed)
[facility]PART[facility] Provider Note   CSN: [id number] Arrival date & time: 01/07/19  2047     History   Chief Complaint Chief Complaint  Patient presents with   Chest Pain   Numbness    HPI Charles Lech. is a 53 y.o. male.      Chest Pain Pain location:  Substernal area and epigastric Pain quality: pressure   Pain quality comment:  Gas pain Pain radiates to:  Does not radiate Pain severity:  Moderate Onset quality:  Gradual Duration:  10 days Timing:  Constant Progression:  Resolved Chronicity:  New Context: at rest   Relieved by:  Nothing Worsened by:  Nothing Ineffective treatments:  None tried Associated symptoms: no AICD problem, no altered mental status, no anorexia, no anxiety, no back pain, no claudication, no cough, no diaphoresis, no dizziness, no dysphagia, no fatigue, no fever, no headache, no heartburn, no lower extremity edema, no nausea, no numbness, no orthopnea, no palpitations, no PND, no shortness of breath, no syncope, no vomiting and no weakness   Associated symptoms comment:  Paresthesias, was started on Topamax and started he increased his does and this is what happened and was forced to stop the medication today because of this.  No weakness no numbness.  No changes in vision or speech, no HA no facial asymmetry.     Past Medical History:  Diagnosis Date   ADHD (attention deficit hyperactivity disorder)    Allergy    Arthritis    Bilateral varicoceles    Depression    Fatty liver 08/06/2011   Genital herpes 10/25/2014   GERD (gastroesophageal reflux disease)    Hyperlipidemia    Hypertension    OSA (obstructive sleep apnea)    Personal history of colonic polyps    Plantar fasciitis    PTSD (post-traumatic stress disorder)     Patient Active Problem List   Diagnosis Date Noted   Myofascial pain dysfunction syndrome 01/02/2017   Insect bites 11/10/2016   Diabetic polyneuropathy associated  with type 2 diabetes mellitus ([place]) 08/25/2016   Achilles tendinitis, left leg 08/25/2016   Achilles tendon contracture, left 08/25/2016   Xerosis of skin 04/22/2016   Chronic urticaria 03/05/2016   Sacroiliac joint dysfunction of both sides 01/01/2016   Anxiety state 08/20/2015   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/31/2015   Patellofemoral disorder 05/15/2015   Current use of beta blocker 05/15/2015   Genital herpes 10/25/2014   Depression 10/05/2014   Osteoarthritis of left knee 08/15/2014   Testicular cyst 06/13/2014   Sciatica 04/22/2014   Osteoarthritis of right knee 02/06/2014   Allergy to mold 12/13/2013   Weight gain, abnormal 11/08/2013   PTSD (post-traumatic stress disorder) 07/05/2013   Multinodular goiter 01/19/2013   GERD (gastroesophageal reflux disease) 08/09/2012   Dermographia 12/29/2011   Neck pain 11/12/2011   Fatty liver 08/06/2011   Cervical disc disease 07/28/2011   Preventative health care 07/28/2011   Erectile dysfunction 05/23/2011   Elevated liver function tests 05/20/2011   Type II diabetes mellitus, well controlled ([place]) 05/19/2011   Essential hypertension 05/19/2011   Hyperlipidemia 05/19/2011   Morbid obesity ([place]) 05/19/2011   Other allergic rhinitis 05/19/2011   Obstructive sleep apnea treated with continuous positive airway pressure (CPAP) 05/19/2011    Past Surgical History:  Procedure Laterality Date   ANKLE SURGERY     DOPPLER ECHOCARDIOGRAPHY  2010   FOOT SURGERY     KNEE SURGERY  08/04/08  Right knee-- medial meniscus repair, attenuation anterior cruciate [name] MD ([facility])    NM MYOVIEW LTD  2010   PLANTAR FASCIA RELEASE     SHOULDER SURGERY     SLEEVE GASTROPLASTY  10/29/2016   VARICOCELE EXCISION          Home Medications    Prior to Admission medications   Medication Sig Start Date End Date Taking? Authorizing Provider   Albuterol Sulfate 108 (90 Base) MCG/ACT AEPB Inhale 1-2 puffs into the lungs every 6 (six) hours as needed (wheezing, sob). 06/30/18   Shelda Pal, DO  atorvastatin (LIPITOR) 10 MG tablet Take 1 tablet (10 mg total) by mouth daily. 11/25/18   Debbrah Alar, NP  Blood Glucose Monitoring Suppl (ACCU-CHEK GUIDE) w/Device KIT 1 each by Does not apply route daily. 11/26/16   Debbrah Alar, NP  buPROPion (WELLBUTRIN XL) 300 MG 24 hr tablet Take 1 tablet (300 mg total) by mouth daily. Patient taking differently: Take 150 mg by mouth daily.  12/26/16   Debbrah Alar, NP  CALCIUM-MAGNESIUM-VITAMIN D PO Take 1 tablet by mouth 3 (three) times daily.    [provider]  cetirizine (ZYRTEC) 10 MG tablet Take 1 tablet (10 mg total) by mouth 2 (two) times daily. 06/17/18   Valentina Shaggy, MD  Cobalamine Combinations (A-63 + FOLIC ACID PO) Take by mouth. 1/2 dropper ful once a day.    [provider]  fexofenadine (ALLEGRA) 180 MG tablet Take 1 tablet (180 mg total) by mouth daily as needed for allergies or rhinitis. 11/10/16   Bobbitt, [place] Muta, MD  fluticasone (FLONASE) 50 MCG/ACT nasal spray Place 2 sprays into both nostrils daily as needed for allergies or rhinitis. 04/29/18   Valentina Shaggy, MD  hydrALAZINE (APRESOLINE) 50 MG tablet TAKE 1 TABLET THREE TIMES A DAY 08/24/17   Debbrah Alar, NP  Lancets (FREESTYLE) lancets USE TO CHECK BLOOD SUGAR ONCE DAILY 04/02/15   Debbrah Alar, NP  levocetirizine (XYZAL) 5 MG tablet One or two tablets in the morning and one or two tablets in the evening as needed.1 10/26/18   Valentina Shaggy, MD  losartan (COZAAR) 50 MG tablet Take 1 tablet (50 mg total) by mouth daily. 07/17/17   Debbrah Alar, NP  metoprolol succinate (TOPROL XL) 100 MG 24 hr tablet Take 1 tablet (100 mg total) by mouth daily. Take with or immediately following a meal. 06/03/17   Debbrah Alar, NP  Multiple Vitamin  (MULTI-VITAMINS) TABS Take 1 tablet by mouth daily.    [provider]  omeprazole (PRILOSEC) 20 MG capsule Take 1 capsule (20 mg total) by mouth 2 (two) times daily before a meal. 11/25/18   Debbrah Alar, NP  omeprazole (PRILOSEC) 20 MG capsule Take 1 capsule (20 mg total) by mouth daily. 01/08/19   Makensie Mulhall, MD  polyethylene glycol (MIRALAX / GLYCOLAX) packet  10/30/16   [provider]  QUEtiapine (SEROQUEL) 25 MG tablet Take 1 tablet (25 mg total) by mouth at bedtime. 08/06/17   Debbrah Alar, NP  sildenafil (VIAGRA) 100 MG tablet TAKE 1/2 TO 1 TABLET BY MOUTH DAILY AS NEEDED 03/13/17   Debbrah Alar, NP  topiramate (TOPAMAX) 50 MG tablet  12/08/18   [provider]  triamcinolone ointment (KENALOG) 0.1 % Apply to red itchy areas only below the face 10/26/18   Valentina Shaggy, MD  VYVANSE 20 MG capsule  04/10/17   [provider]  amLODipine (NORVASC) 5 MG tablet Take  1 tablet (5 mg total) by mouth daily. 07/17/17 06/11/18  Debbrah Alar, NP  potassium chloride (K-DUR) 10 MEQ tablet Take 1 tablet (10 mEq total) by mouth daily. 05/19/11 07/11/11  Debbrah Alar, NP    Family History Family History  Problem Relation Age of Onset   Diabetes Mother    Hypertension Mother    Arthritis Mother    Cancer Mother        uterine and breast cancer, sarcoma of the brain   Hyperlipidemia Father    Arthritis Father    Cancer Father        thyroid, prostate cancer   Other Father        mass in stomach   Hyperlipidemia Maternal Grandmother    Hypertension Maternal Grandmother    Hyperlipidemia Maternal Grandfather    Hypertension Maternal Grandfather    Hyperlipidemia Paternal Grandmother    Hypertension Paternal Grandmother    Hyperlipidemia Paternal Grandfather    Hypertension Paternal Grandfather    Heart attack Paternal Grandfather    Sudden death Neg Hx    Colon cancer Neg Hx    Esophageal cancer Neg  Hx    Stomach cancer Neg Hx    Rectal cancer Neg Hx     Social History Social History   Tobacco Use   Smoking status: Never Smoker   Smokeless tobacco: Never Used  Substance Use Topics   Alcohol use: No    Alcohol/week: 0.0 standard drinks   Drug use: No     Allergies   Oxycodone-acetaminophen and Zoloft [sertraline hcl]   Review of Systems Review of Systems  Constitutional: Negative for diaphoresis, fatigue and fever.  HENT: Negative for trouble swallowing.   Eyes: Negative for visual disturbance.  Respiratory: Negative for cough and shortness of breath.   Cardiovascular: Positive for chest pain. Negative for palpitations, orthopnea, claudication, leg swelling, syncope and PND.  Gastrointestinal: Negative for anorexia, heartburn, nausea and vomiting.  Genitourinary: Negative for difficulty urinating.  Musculoskeletal: Negative for back pain and gait problem.  Neurological: Negative for dizziness, tremors, facial asymmetry, speech difficulty, weakness, light-headedness, numbness and headaches.  Psychiatric/Behavioral: Negative for agitation.  All other systems reviewed and are negative.    Physical Exam Updated Vital Signs BP (!) 149/90 (BP Location: Right Arm)    Pulse 68    Temp 98.3 F (36.8 C) (Oral)    Resp 20    SpO2 99%   Physical Exam Vitals signs and nursing note reviewed.  Constitutional:      General: He is not in acute distress.    Appearance: Normal appearance.  HENT:     Head: Normocephalic and atraumatic.     Nose: Nose normal.     Mouth/Throat:     Mouth: Mucous membranes are moist.     Pharynx: Oropharynx is clear.  Eyes:     Extraocular Movements: Extraocular movements intact.     Conjunctiva/sclera: Conjunctivae normal.     Pupils: Pupils are equal, round, and reactive to light.  Neck:     Musculoskeletal: Normal range of motion and neck supple.  Cardiovascular:     Rate and Rhythm: Normal rate and regular rhythm.     Pulses:  Normal pulses.     Heart sounds: Normal heart sounds.  Pulmonary:     Effort: Pulmonary effort is normal.     Breath sounds: Normal breath sounds.  Abdominal:     General: Abdomen is flat. Bowel sounds are normal.     Tenderness: There is  no abdominal tenderness. There is no guarding.  Musculoskeletal: Normal range of motion.        General: No tenderness.     Right lower leg: No edema.     Left lower leg: No edema.  Skin:    General: Skin is warm and dry.     Capillary Refill: Capillary refill takes less than 2 seconds.  Neurological:     General: No focal deficit present.     Mental Status: He is alert and oriented to person, place, and time.     Cranial Nerves: No cranial nerve deficit.     Sensory: No sensory deficit.     Motor: No weakness.     Gait: Gait normal.     Deep Tendon Reflexes: Reflexes normal.  Psychiatric:        Mood and Affect: Mood normal.        Behavior: Behavior normal.      ED Treatments / Results  Labs (all labs ordered are listed, but only abnormal results are displayed) Results for orders placed or performed during the hospital encounter of 68/12/75  Basic metabolic panel  Result Value Ref Range   Sodium 139 135 - 145 mmol/L   Potassium 3.5 3.5 - 5.1 mmol/L   Chloride 101 98 - 111 mmol/L   CO2 27 22 - 32 mmol/L   Glucose, Bld 101 (H) 70 - 99 mg/dL   BUN 15 6 - 20 mg/dL   Creatinine, Ser 1.07 0.61 - 1.24 mg/dL   Calcium 9.2 8.9 - 10.3 mg/dL   GFR calc non Af Amer >60 >60 mL/min   GFR calc Af Amer >60 >60 mL/min   Anion gap 11 5 - 15  CBC  Result Value Ref Range   WBC 9.5 4.0 - 10.5 K/uL   RBC 5.03 4.22 - 5.81 MIL/uL   Hemoglobin 15.0 13.0 - 17.0 g/dL   HCT 46.1 39.0 - 52.0 %   MCV 91.7 80.0 - 100.0 fL   MCH 29.8 26.0 - 34.0 pg   MCHC 32.5 30.0 - 36.0 g/dL   RDW 13.2 11.5 - 15.5 %   Platelets 308 150 - 400 K/uL   nRBC 0.0 0.0 - 0.2 %  Troponin I (High Sensitivity)  Result Value Ref Range   Troponin I (High Sensitivity) 4 <18 ng/L   Troponin I (High Sensitivity)  Result Value Ref Range   Troponin I (High Sensitivity) 4 <18 ng/L   Dg Chest 2 View  Result Date: 01/07/2019 CLINICAL DATA:  Facial numbness. 10 hours. EXAM: CHEST - 2 VIEW COMPARISON:  09/24/2016 FINDINGS: The cardiomediastinal contours are normal. The lungs are clear. Pulmonary vasculature is normal. No consolidation, pleural effusion, or pneumothorax. No acute osseous abnormalities are seen. IMPRESSION: Normal radiographs of the chest. Electronically Signed   By: Keith Rake M.D.   On: 01/07/2019 23:29   Ct Head Wo Contrast  Result Date: 01/07/2019 CLINICAL DATA:  Pain. Facial numbness. EXAM: CT HEAD WITHOUT CONTRAST TECHNIQUE: Contiguous axial images were obtained from the base of the skull through the vertex without intravenous contrast. COMPARISON:  Head CT 02/18/2012 FINDINGS: Brain: No evidence of acute infarction, hemorrhage, hydrocephalus, extra-axial collection or mass lesion/mass effect. Vascular: No hyperdense vessel or unexpected calcification. Skull: No fracture or focal lesion. Sinuses/Orbits: Paranasal sinuses are clear. Trace chronic opacification of lower most left mastoid air cells. The visualized orbits are unremarkable. Other: None. IMPRESSION: Unremarkable noncontrast head CT. Electronically Signed   By: Aurther Loft.D.  On: 01/07/2019 23:36    EKG EKG Interpretation  Date/Time:  Friday January 07 2019 20:57:06 EDT Ventricular Rate:  72 PR Interval:  146 QRS Duration: 98 QT Interval:  450 QTC Calculation: 492 R Axis:   46 Text Interpretation:  Normal sinus rhythm Confirmed by Randal Buba, Donell Tomkins ([id number]) on 01/07/2019 11:12:27 PM   Radiology Dg Chest 2 View  Result Date: 01/07/2019 CLINICAL DATA:  Facial numbness. 10 hours. EXAM: CHEST - 2 VIEW COMPARISON:  09/24/2016 FINDINGS: The cardiomediastinal contours are normal. The lungs are clear. Pulmonary vasculature is normal. No consolidation, pleural effusion, or pneumothorax.  No acute osseous abnormalities are seen. IMPRESSION: Normal radiographs of the chest. Electronically Signed   By: Keith Rake M.D.   On: 01/07/2019 23:29   Ct Head Wo Contrast  Result Date: 01/07/2019 CLINICAL DATA:  Pain. Facial numbness. EXAM: CT HEAD WITHOUT CONTRAST TECHNIQUE: Contiguous axial images were obtained from the base of the skull through the vertex without intravenous contrast. COMPARISON:  Head CT 02/18/2012 FINDINGS: Brain: No evidence of acute infarction, hemorrhage, hydrocephalus, extra-axial collection or mass lesion/mass effect. Vascular: No hyperdense vessel or unexpected calcification. Skull: No fracture or focal lesion. Sinuses/Orbits: Paranasal sinuses are clear. Trace chronic opacification of lower most left mastoid air cells. The visualized orbits are unremarkable. Other: None. IMPRESSION: Unremarkable noncontrast head CT. Electronically Signed   By: Keith Rake M.D.   On: 01/07/2019 23:36    Procedures Procedures (including critical care time)  Medications Ordered in ED Medications  fentaNYL (SUBLIMAZE) 100 MCG/2ML injection (has no administration in time range)  alum & mag hydroxide-simeth (MAALOX/MYLANTA) 200-200-20 MG/5ML suspension 30 mL (30 mLs Oral Given 01/07/19 2344)    And  lidocaine (XYLOCAINE) 2 % viscous mouth solution 15 mL (15 mLs Oral Given 01/07/19 2344)     HEART score is 1, low risk for MACE.  Ruled out for MI with a negative EKG and 2 negative high sensitivity troponins in the ED. Started post prandial and felt like gas I suspect this pain is GERD and will start omeprazole.  I suspect the paresthesias are related to theTopamax which is a new medication for the patient, as it is a known side effect.  I see no signs of stroke.  If symptoms persist patient will need an outpatient MRI.  Patient verbalizes understanding and agrees to follow up.   Charles Huaracha. was evaluated in Emergency Department on 01/08/2019 for the symptoms described in  the history of present illness. He was evaluated in the context of the global COVID-19 pandemic, which necessitated consideration that the patient might be at risk for infection with the SARS-CoV-2 virus that causes COVID-19. Institutional protocols and algorithms that pertain to the evaluation of patients at risk for COVID-19 are in a state of rapid change based on information released by regulatory bodies including the CDC and federal and state organizations. These policies and algorithms were followed during the patient's care in the ED.  Final Clinical Impressions(s) / ED Diagnoses   Final diagnoses:  Precordial pain    Return for intractable cough, coughing up blood,fevers >100.4 unrelieved by medication, shortness of breath, intractable vomiting, chest pain, shortness of breath, weakness,numbness, changes in speech, facial asymmetry,abdominal pain, passing out,Inability to tolerate liquids or food, cough, altered mental status or any concerns. No signs of systemic illness or infection. The patient is nontoxic-appearing on exam and vital signs are within normal limits.   I have reviewed the triage vital signs and the nursing notes. Pertinent labs &  imaging results that were available during my care of the patient were reviewed by me and considered in my medical decision making (see chart for details).After history, exam, and medical workup I feel the patient has beenappropriately medically screened and is safe for discharge home. Pertinent diagnoses were discussed with the patient. Patient was given return precautions. ED Discharge Orders         Ordered    omeprazole (PRILOSEC) 20 MG capsule  Daily     01/08/19 0007           Giani Winther, MD 01/08/19 0148

## 2019-01-11 ENCOUNTER — Telehealth: Payer: Self-pay

## 2019-01-11 NOTE — Telephone Encounter (Signed)
Patient states they did a virtual call with primary care and the MD stated he was okay to return and thought they send referral.  I advised they did not an his Charles Nash has expired.  I advised it would be his responsibility he would need to get in touch with them.

## 2019-02-03 ENCOUNTER — Encounter: Admitting: Physical Medicine & Rehabilitation

## 2019-02-07 ENCOUNTER — Other Ambulatory Visit (INDEPENDENT_AMBULATORY_CARE_PROVIDER_SITE_OTHER)

## 2019-02-07 ENCOUNTER — Other Ambulatory Visit: Payer: Self-pay

## 2019-02-07 DIAGNOSIS — E119 Type 2 diabetes mellitus without complications: Secondary | ICD-10-CM

## 2019-02-08 ENCOUNTER — Ambulatory Visit (INDEPENDENT_AMBULATORY_CARE_PROVIDER_SITE_OTHER): Admitting: Family

## 2019-02-08 ENCOUNTER — Encounter: Payer: Self-pay | Admitting: Family

## 2019-02-08 ENCOUNTER — Ambulatory Visit (INDEPENDENT_AMBULATORY_CARE_PROVIDER_SITE_OTHER)

## 2019-02-08 VITALS — BP 142/85 | HR 72 | Temp 97.4°F | Resp 16 | Ht 73.0 in | Wt 302.0 lb

## 2019-02-08 DIAGNOSIS — G4733 Obstructive sleep apnea (adult) (pediatric): Secondary | ICD-10-CM

## 2019-02-08 DIAGNOSIS — Z23 Encounter for immunization: Secondary | ICD-10-CM | POA: Diagnosis not present

## 2019-02-08 DIAGNOSIS — I10 Essential (primary) hypertension: Secondary | ICD-10-CM | POA: Diagnosis not present

## 2019-02-08 DIAGNOSIS — N50812 Left testicular pain: Secondary | ICD-10-CM

## 2019-02-08 DIAGNOSIS — N50811 Right testicular pain: Secondary | ICD-10-CM

## 2019-02-08 DIAGNOSIS — E119 Type 2 diabetes mellitus without complications: Secondary | ICD-10-CM | POA: Diagnosis not present

## 2019-02-08 LAB — BASIC METABOLIC PANEL
BUN: 9 mg/dL (ref 6–23)
CO2: 30 mEq/L (ref 19–32)
Calcium: 9.6 mg/dL (ref 8.4–10.5)
Chloride: 102 mEq/L (ref 96–112)
Creatinine, Ser: 1.08 mg/dL (ref 0.40–1.50)
GFR: 86.68 mL/min (ref >60.00)
Glucose, Bld: 108 mg/dL — ABNORMAL HIGH (ref 70–99)
Potassium: 3.8 mEq/L (ref 3.5–5.1)
Sodium: 140 mEq/L (ref 135–145)

## 2019-02-08 LAB — HEMOGLOBIN A1C: Hgb A1c MFr Bld: 6.4 % (ref 4.6–6.5)

## 2019-02-08 MED ORDER — METOPROLOL SUCCINATE ER 100 MG PO TB24: 100.0000 mg | ORAL_TABLET | Freq: Two times a day (BID) | ORAL | 1 refills | Status: AC

## 2019-02-08 NOTE — Progress Notes (Signed)
Subjective:    Patient ID: Charles Nash., male    DOB: Aug 20, 1965, 53 y.o.   MRN: [id number]  HPI   Patient is a 53 yr old male who presents today for follow up.  DM2- Does not check his sugars at home.  Lab Results  Component Value Date   HGBA1C 6.4 02/07/2019   HGBA1C 6.1 06/11/2018   HGBA1C 6.1 03/10/2018   Lab Results  Component Value Date   MICROALBUR 2.3 (H) 01/26/2015   LDLCALC 98 06/11/2018   CREATININE 1.08 02/07/2019   HTN-  Reports that he has only been taking hydralazine bid.  Reports that he is also taking toprol xl '100mg'$  bid (not once daily).  This was adjusted up by the [place] he says. Only taking hydralazine bid.  BP Readings from Last 3 Encounters:  02/08/19 (!) 142/85  01/08/19 (!) 149/90  12/10/18 (!) 148/90   Testicular pain- Reports that he has bilateral scrotal pain. Denies redness/welling.  Has tenderness, worse with walking.  Has been present for >6 months.    OSA- reports that he has not used cpap in 2 years. [facility] but [facility] never did.  He has had significant weight loss since his last sleep study.  Wt Readings from Last 3 Encounters:  02/08/19 (!) 302 lb (137 kg)  01/05/19 300 lb (136.1 kg)  12/10/18 299 lb (135.6 kg)      Review of Systems See HPI  Past Medical History:  Diagnosis Date  . ADHD (attention deficit hyperactivity disorder)   . Allergy   . Arthritis   . Bilateral varicoceles   . Depression   . Fatty liver 08/06/2011  . Genital herpes 10/25/2014  . GERD (gastroesophageal reflux disease)   . Hyperlipidemia   . Hypertension   . OSA (obstructive sleep apnea)   . Personal history of colonic polyps   . Plantar fasciitis   . PTSD (post-traumatic stress disorder)      Social History   Socioeconomic History  . Marital status: Divorced    Spouse name: Not on file  . Number of children: 2  . Years of education: Not on file  . Highest education level: Not on file  Occupational  History  . Occupation: Astronomer: [employer]  Social Needs  . Financial resource strain: Not on file  . Food insecurity    Worry: Not on file    Inability: Not on file  . Transportation needs    Medical: Not on file    Non-medical: Not on file  Tobacco Use  . Smoking status: Never Smoker  . Smokeless tobacco: Never Used  Substance and Sexual Activity  . Alcohol use: No    Alcohol/week: 0.0 standard drinks  . Drug use: No  . Sexual activity: Not on file  Lifestyle  . Physical activity    Days per week: Not on file    Minutes per session: Not on file  . Stress: Not on file  Relationships  . Social Herbalist on phone: Not on file    Gets together: Not on file    Attends religious service: Not on file    Active member of club or organization: Not on file    Attends meetings of clubs or organizations: Not on file    Relationship status: Not on file  . Intimate partner violence    Fear of current or ex partner: Not  on file    Emotionally abused: Not on file    Physically abused: Not on file    Forced sexual activity: Not on file  Other Topics Concern  . Not on file  Social History Narrative   Regular exercise:  No   Caffeine use:  2--32oz cups daily       Past Surgical History:  Procedure Laterality Date  . ANKLE SURGERY    . DOPPLER ECHOCARDIOGRAPHY  2010  . FOOT SURGERY    . KNEE SURGERY  08/04/08   Right knee-- medial meniscus repair, attenuation anterior cruciate [name] MD [facility])   . NM MYOVIEW LTD  2010  . PLANTAR FASCIA RELEASE    . SHOULDER SURGERY    . SLEEVE GASTROPLASTY  10/29/2016  . VARICOCELE EXCISION      Family History  Problem Relation Age of Onset  . Diabetes Mother   . Hypertension Mother   . Arthritis Mother   . Cancer Mother        uterine and breast cancer, sarcoma of the brain  . Hyperlipidemia Father   . Arthritis Father   . Cancer Father        thyroid, prostate cancer  .  Other Father        mass in stomach  . Hyperlipidemia Maternal Grandmother   . Hypertension Maternal Grandmother   . Hyperlipidemia Maternal Grandfather   . Hypertension Maternal Grandfather   . Hyperlipidemia Paternal Grandmother   . Hypertension Paternal Grandmother   . Hyperlipidemia Paternal Grandfather   . Hypertension Paternal Grandfather   . Heart attack Paternal Grandfather   . Sudden death Neg Hx   . Colon cancer Neg Hx   . Esophageal cancer Neg Hx   . Stomach cancer Neg Hx   . Rectal cancer Neg Hx     Allergies  Allergen Reactions  . Oxycodone-Acetaminophen Hives and Itching  . Zoloft [Sertraline Hcl]     ?weight gain. "made me feel weird"    Current Outpatient Medications on File Prior to Visit  Medication Sig Dispense Refill  . Albuterol Sulfate 108 (90 Base) MCG/ACT AEPB Inhale 1-2 puffs into the lungs every 6 (six) hours as needed (wheezing, sob). 1 each 2  . atorvastatin (LIPITOR) 10 MG tablet Take 1 tablet (10 mg total) by mouth daily. 90 tablet 1  . Blood Glucose Monitoring Suppl (ACCU-CHEK GUIDE) w/Device KIT 1 each by Does not apply route daily. 1 kit 0  . buPROPion (WELLBUTRIN XL) 300 MG 24 hr tablet Take 1 tablet (300 mg total) by mouth daily. (Patient taking differently: Take 150 mg by mouth daily. )    . CALCIUM-MAGNESIUM-VITAMIN D PO Take 1 tablet by mouth 3 (three) times daily.    . cetirizine (ZYRTEC) 10 MG tablet Take 1 tablet (10 mg total) by mouth 2 (two) times daily. 60 tablet 5  . Cobalamine Combinations (O-03 + FOLIC ACID PO) Take by mouth. 1/2 dropper ful once a day.    . fexofenadine (ALLEGRA) 180 MG tablet Take 1 tablet (180 mg total) by mouth daily as needed for allergies or rhinitis. 30 tablet 2  . fluticasone (FLONASE) 50 MCG/ACT nasal spray Place 2 sprays into both nostrils daily as needed for allergies or rhinitis. 16 g 5  . hydrALAZINE (APRESOLINE) 50 MG tablet TAKE 1 TABLET THREE TIMES A DAY 270 tablet 1  . Lancets (FREESTYLE) lancets USE  TO CHECK BLOOD SUGAR ONCE DAILY 100 each 1  . levocetirizine (XYZAL) 5 MG  tablet One or two tablets in the morning and one or two tablets in the evening as needed.1 120 tablet 5  . losartan (COZAAR) 50 MG tablet Take 1 tablet (50 mg total) by mouth daily. 30 tablet 5  . metoprolol succinate (TOPROL XL) 100 MG 24 hr tablet Take 1 tablet (100 mg total) by mouth daily. Take with or immediately following a meal. 90 tablet 1  . Multiple Vitamin (MULTI-VITAMINS) TABS Take 1 tablet by mouth daily.    Marland Kitchen omeprazole (PRILOSEC) 20 MG capsule Take 1 capsule (20 mg total) by mouth 2 (two) times daily before a meal. 180 capsule 1  . omeprazole (PRILOSEC) 20 MG capsule Take 1 capsule (20 mg total) by mouth daily. 30 capsule 0  . polyethylene glycol (MIRALAX / GLYCOLAX) packet     . QUEtiapine (SEROQUEL) 25 MG tablet Take 1 tablet (25 mg total) by mouth at bedtime. 90 tablet 0  . sildenafil (VIAGRA) 100 MG tablet TAKE 1/2 TO 1 TABLET BY MOUTH DAILY AS NEEDED 15 tablet 1  . topiramate (TOPAMAX) 50 MG tablet     . triamcinolone ointment (KENALOG) 0.1 % Apply to red itchy areas only below the face 80 g 3  . VYVANSE 20 MG capsule     . [DISCONTINUED] amLODipine (NORVASC) 5 MG tablet Take 1 tablet (5 mg total) by mouth daily. 30 tablet 3  . [DISCONTINUED] potassium chloride (K-DUR) 10 MEQ tablet Take 1 tablet (10 mEq total) by mouth daily. 30 tablet 1   No current facility-administered medications on file prior to visit.     BP (!) 142/85 (BP Location: Left Arm, Patient Position: Sitting, Cuff Size: Large)   Pulse 72   Temp (!) 97.4 F (36.3 C) (Temporal)   Resp 16   Ht '6\' 1"'$  (1.854 m)   Wt (!) 302 lb (137 kg)   SpO2 100%   BMI 39.84 kg/m       Objective:   Physical Exam Constitutional:      General: He is not in acute distress.    Appearance: He is well-developed.  HENT:     Head: Normocephalic and atraumatic.  Cardiovascular:     Rate and Rhythm: Normal rate and regular rhythm.     Heart  sounds: No murmur.  Pulmonary:     Effort: Pulmonary effort is normal. No respiratory distress.     Breath sounds: Normal breath sounds. No wheezing or rales.  Skin:    General: Skin is warm and dry.  Neurological:     Mental Status: He is alert and oriented to person, place, and time.  Psychiatric:        Behavior: Behavior normal.        Thought Content: Thought content normal.           Assessment & Plan:  HTN- bp mildly above goal. Advised pt to increase hydralazine to tid.    DM2- at goal.  Had flu shot today.  Testicular pain- will obtain Testicular doppler for further evaluation.  OSA- will obtain follow up split night study.

## 2019-02-09 ENCOUNTER — Other Ambulatory Visit (HOSPITAL_BASED_OUTPATIENT_CLINIC_OR_DEPARTMENT_OTHER)

## 2019-02-10 ENCOUNTER — Ambulatory Visit (HOSPITAL_BASED_OUTPATIENT_CLINIC_OR_DEPARTMENT_OTHER)
Admission: RE | Admit: 2019-02-10 | Discharge: 2019-02-10 | Disposition: A | Discharge status: Home / Self Care | Source: Ambulatory Visit | Attending: Family | Admitting: Family

## 2019-02-10 ENCOUNTER — Other Ambulatory Visit: Payer: Self-pay

## 2019-02-10 ENCOUNTER — Other Ambulatory Visit (HOSPITAL_BASED_OUTPATIENT_CLINIC_OR_DEPARTMENT_OTHER): Payer: Self-pay | Admitting: Student

## 2019-02-10 DIAGNOSIS — N50812 Left testicular pain: Secondary | ICD-10-CM | POA: Insufficient documentation

## 2019-02-10 DIAGNOSIS — N50811 Right testicular pain: Secondary | ICD-10-CM | POA: Insufficient documentation

## 2019-02-11 ENCOUNTER — Telehealth: Payer: Self-pay | Admitting: Family

## 2019-02-11 DIAGNOSIS — N50812 Left testicular pain: Secondary | ICD-10-CM

## 2019-02-11 DIAGNOSIS — N50811 Right testicular pain: Secondary | ICD-10-CM

## 2019-02-11 NOTE — Telephone Encounter (Signed)
Patient advised of US findings and referral. He will call his urologist Dr. Junious Silk for appointment.

## 2019-02-11 NOTE — Telephone Encounter (Signed)
Please advise pt that his ultrasound shows a small cyst, as well as small varicocele/hydrocele.  I would like to refer him to urology for further evaluation.

## 2019-02-15 ENCOUNTER — Encounter: Attending: Physical Medicine & Rehabilitation | Admitting: Physical Medicine & Rehabilitation

## 2019-02-15 DIAGNOSIS — M5416 Radiculopathy, lumbar region: Secondary | ICD-10-CM | POA: Insufficient documentation

## 2019-03-02 ENCOUNTER — Encounter: Payer: Self-pay | Admitting: Family Medicine

## 2019-03-02 ENCOUNTER — Ambulatory Visit (INDEPENDENT_AMBULATORY_CARE_PROVIDER_SITE_OTHER): Admitting: Family Medicine

## 2019-03-02 ENCOUNTER — Other Ambulatory Visit: Payer: Self-pay

## 2019-03-02 VITALS — BP 146/78 | HR 84 | Temp 98.4°F | Resp 16 | Ht 72.0 in | Wt 300.3 lb

## 2019-03-02 DIAGNOSIS — L500 Allergic urticaria: Secondary | ICD-10-CM | POA: Insufficient documentation

## 2019-03-02 DIAGNOSIS — J3089 Other allergic rhinitis: Secondary | ICD-10-CM | POA: Diagnosis not present

## 2019-03-02 DIAGNOSIS — J4520 Mild intermittent asthma, uncomplicated: Secondary | ICD-10-CM | POA: Insufficient documentation

## 2019-03-02 MED ORDER — LEVOCETIRIZINE DIHYDROCHLORIDE 5 MG PO TABS: ORAL_TABLET | ORAL | 5 refills | Status: DC

## 2019-03-02 MED ORDER — FAMOTIDINE 20 MG PO TABS: 20.0000 mg | ORAL_TABLET | Freq: Two times a day (BID) | ORAL | 5 refills | Status: DC

## 2019-03-02 MED ORDER — ALBUTEROL SULFATE HFA 108 (90 BASE) MCG/ACT IN AERS: 2.0000 | INHALATION_SPRAY | RESPIRATORY_TRACT | 1 refills | Status: DC | PRN

## 2019-03-02 MED ORDER — TRIAMCINOLONE ACETONIDE 0.1 % EX OINT: TOPICAL_OINTMENT | CUTANEOUS | 3 refills | Status: AC

## 2019-03-02 NOTE — Assessment & Plan Note (Signed)
Stable.  Continue appropriate allergen avoidance measures and fluticasone nasal spray as needed.  Levocetirizine has been prescribed (as above).

## 2019-03-02 NOTE — Assessment & Plan Note (Signed)
   A refill prescription has been provided for albuterol HFA, 1 to 2 inhalations every 4-6 hours if needed and 15 minutes prior to exercise.  Subjective and objective measures of pulmonary function will be followed and the treatment plan will be adjusted accordingly.

## 2019-03-02 NOTE — Assessment & Plan Note (Addendum)
   Instructions have been discussed and provided for H1/H2 receptor blockade with titration to find lowest effective dose.  Samples and a prescription have been provided for levocetirizine (Xyzal) 5 mg daily if needed.  A prescription has been provided for famotidine (Pepcid) 20 mg twice daily if needed.  A prescription has been provided for triamcinolone 0.1% ointment sparingly to affected areas twice daily as needed.  Triamcinolone is not to be used on the face, neck, axillae, or groin area.  Moisturize skin with Aquaphor.

## 2019-03-02 NOTE — Patient Instructions (Addendum)
Recurrent urticaria  Instructions have been discussed and provided for H1/H2 receptor blockade with titration to find lowest effective dose.  Samples and a prescription have been provided for levocetirizine (Xyzal) 5 mg daily if needed.  A prescription has been provided for famotidine (Pepcid) 20 mg twice daily if needed.  A prescription has been provided for triamcinolone 0.1% ointment sparingly to affected areas twice daily as needed.  Triamcinolone is not to be used on the face, neck, axillae, or groin area.  Moisturize skin with Aquaphor.  Mild intermittent asthma  A refill prescription has been provided for albuterol HFA, 1 to 2 inhalations every 4-6 hours if needed and 15 minutes prior to exercise.  Subjective and objective measures of pulmonary function will be followed and the treatment plan will be adjusted accordingly.  Other allergic rhinitis Stable.  Continue appropriate allergen avoidance measures and fluticasone nasal spray as needed.  Levocetirizine has been prescribed (as above).   Return in about 5 months (around 07/31/2019), or if symptoms worsen or fail to improve.  Urticaria (Hives)  . Levocetirizine (Xyzal) 5 mg twice a day and famotidine (Pepcid) 20 mg twice a day. If no symptoms for 7-14 days then decrease to. . Levocetirizine (Xyzal) 5 mg twice a day and famotidine (Pepcid) 20 mg once a day.  If no symptoms for 7-14 days then decrease to. . Levocetirizine (Xyzal) 5 mg twice a day.  If no symptoms for 7-14 days then decrease to. . Levocetirizine (Xyzal) 5 mg once a day.  May use Benadryl (diphenhydramine) as needed for breakthrough symptoms       If symptoms return, then step up dosage

## 2019-03-02 NOTE — Progress Notes (Signed)
Follow-up Note  RE: Charles Nash. MRN: [id number] DOB: Jan 31, 1966 Date of Office Visit: 03/02/2019  Primary care provider: Debbrah Alar, NP Referring provider: Debbrah Alar, NP  History of present illness: Charles Nash. is a 53 y.o. male with a history of allergic rhinitis, intermittent asthma, and chronic urticaria presenting today for follow-up.  He was last seen in this clinic in July 2020.  He reports that over the past 2 weeks he has experienced generalized pruritus on his back, arms, and legs.  In addition, he has experienced hives on his hands.  He reports that he ran out of levocetirizine and triamcinolone ointment.  His nasal allergy symptoms have been controlled with fluticasone nasal spray.  His asthma has been well controlled.  He has only required albuterol rescue prior to exercise, however he is now out of albuterol.  He has not been experiencing limitations in normal daily activities or nocturnal awakenings due to lower respiratory symptoms.  Assessment and plan: Recurrent urticaria  Instructions have been discussed and provided for H1/H2 receptor blockade with titration to find lowest effective dose.  Samples and a prescription have been provided for levocetirizine (Xyzal) 5 mg daily if needed.  A prescription has been provided for famotidine (Pepcid) 20 mg twice daily if needed.  A prescription has been provided for triamcinolone 0.1% ointment sparingly to affected areas twice daily as needed.  Triamcinolone is not to be used on the face, neck, axillae, or groin area.  Moisturize skin with Aquaphor.  Mild intermittent asthma  A refill prescription has been provided for albuterol HFA, 1 to 2 inhalations every 4-6 hours if needed and 15 minutes prior to exercise.  Subjective and objective measures of pulmonary function will be followed and the treatment plan will be adjusted accordingly.  Other allergic rhinitis Stable.  Continue appropriate  allergen avoidance measures and fluticasone nasal spray as needed.  Levocetirizine has been prescribed (as above).   Meds ordered this encounter  Medications   albuterol (VENTOLIN HFA) 108 (90 Base) MCG/ACT inhaler    Sig: Inhale 2 puffs into the lungs every 4 (four) hours as needed for wheezing or shortness of breath.    Dispense:  18 g    Refill:  1   levocetirizine (XYZAL) 5 MG tablet    Sig: One or two tablets in the morning and one or two tablets in the evening as needed.1    Dispense:  120 tablet    Refill:  5   triamcinolone ointment (KENALOG) 0.1 %    Sig: Apply to red itchy areas only below the face    Dispense:  80 g    Refill:  3   famotidine (PEPCID) 20 MG tablet    Sig: Take 1 tablet (20 mg total) by mouth 2 (two) times daily.    Dispense:  60 tablet    Refill:  5    Diagnostics: Spirometry:  Normal with an FEV1 of 88% predicted. This study was performed while the patient was asymptomatic.  Please see scanned spirometry results for details.    Physical examination: Blood pressure (!) 146/78, pulse 84, temperature 98.4 F (36.9 C), temperature source Oral, resp. rate 16, height 6' (1.829 m), weight (!) 300 lb 4.3 oz (136.2 kg), SpO2 97 %.   General: Alert, interactive, in no acute distress. HEENT: TMs pearly gray, turbinates moderately edematous without discharge, post-pharynx mildly erythematous. Neck: Supple without lymphadenopathy. Lungs: Clear to auscultation without wheezing, rhonchi or rales. CV: Normal S1, S2  without murmurs. Skin: Warm and dry, without lesions or rashes.  The following portions of the patient's history were reviewed and updated as appropriate: allergies, current medications, past family history, past medical history, past social history, past surgical history and problem list.   Current Outpatient Medications  Medication Sig Dispense Refill   Albuterol Sulfate 108 (90 Base) MCG/ACT AEPB Inhale 1-2 puffs into the lungs every 6  (six) hours as needed (wheezing, sob). 1 each 2   atorvastatin (LIPITOR) 10 MG tablet Take 1 tablet (10 mg total) by mouth daily. 90 tablet 1   buPROPion (WELLBUTRIN XL) 300 MG 24 hr tablet Take 1 tablet (300 mg total) by mouth daily. (Patient taking differently: Take 150 mg by mouth daily. )     CALCIUM-MAGNESIUM-VITAMIN D PO Take 1 tablet by mouth 3 (three) times daily.     cetirizine (ZYRTEC) 10 MG tablet Take 1 tablet (10 mg total) by mouth 2 (two) times daily. 60 tablet 5   Cyanocobalamin (VITAMIN B12 PO) Take by mouth.     fexofenadine (ALLEGRA) 180 MG tablet Take 1 tablet (180 mg total) by mouth daily as needed for allergies or rhinitis. 30 tablet 2   fluticasone (FLONASE) 50 MCG/ACT nasal spray Place 2 sprays into both nostrils daily as needed for allergies or rhinitis. 16 g 5   FOLIC ACID PO Take by mouth.     hydrALAZINE (APRESOLINE) 50 MG tablet TAKE 1 TABLET THREE TIMES A DAY 270 tablet 1   levocetirizine (XYZAL) 5 MG tablet One or two tablets in the morning and one or two tablets in the evening as needed.1 120 tablet 5   Liraglutide -Weight Management (SAXENDA) 18 MG/3ML SOPN Inject into the skin.     losartan (COZAAR) 50 MG tablet Take 1 tablet (50 mg total) by mouth daily. 30 tablet 5   metoprolol succinate (TOPROL XL) 100 MG 24 hr tablet Take 1 tablet (100 mg total) by mouth 2 (two) times daily. Take with or immediately following a meal. 90 tablet 1   Multiple Vitamin (MULTI-VITAMINS) TABS Take 1 tablet by mouth daily.     omeprazole (PRILOSEC) 20 MG capsule Take 1 capsule (20 mg total) by mouth 2 (two) times daily before a meal. 180 capsule 1   polyethylene glycol (MIRALAX / GLYCOLAX) packet      QUEtiapine (SEROQUEL) 25 MG tablet Take 1 tablet (25 mg total) by mouth at bedtime. 90 tablet 0   sildenafil (VIAGRA) 100 MG tablet TAKE 1/2 TO 1 TABLET BY MOUTH DAILY AS NEEDED 15 tablet 1   triamcinolone ointment (KENALOG) 0.1 % Apply to red itchy areas only below the  face 80 g 3   VYVANSE 20 MG capsule      albuterol (VENTOLIN HFA) 108 (90 Base) MCG/ACT inhaler Inhale 2 puffs into the lungs every 4 (four) hours as needed for wheezing or shortness of breath. 18 g 1   famotidine (PEPCID) 20 MG tablet Take 1 tablet (20 mg total) by mouth 2 (two) times daily. 60 tablet 5   No current facility-administered medications for this visit.     Allergies  Allergen Reactions   Oxycodone-Acetaminophen Hives and Itching   Zoloft [Sertraline Hcl]     ?weight gain. "made me feel weird"    Review of systems: Review of systems negative except as noted in HPI / PMHx or noted below: Constitutional: Negative.  HENT: Negative.   Eyes: Negative.  Respiratory: Negative.   Cardiovascular: Negative.  Gastrointestinal: Negative.  Genitourinary: Negative.  Musculoskeletal: Negative.  Neurological: Negative.  Endo/Heme/Allergies: Negative.  Cutaneous: Negative.   Past Medical History:  Diagnosis Date   ADHD (attention deficit hyperactivity disorder)    Allergy    Arthritis    Bilateral varicoceles    Depression    Fatty liver 08/06/2011   Genital herpes 10/25/2014   GERD (gastroesophageal reflux disease)    Hyperlipidemia    Hypertension    OSA (obstructive sleep apnea)    Personal history of colonic polyps    Plantar fasciitis    PTSD (post-traumatic stress disorder)     Family History  Problem Relation Age of Onset   Diabetes Mother    Hypertension Mother    Arthritis Mother    Cancer Mother        uterine and breast cancer, sarcoma of the brain   Hyperlipidemia Father    Arthritis Father    Cancer Father        thyroid, prostate cancer   Other Father        mass in stomach   Hyperlipidemia Maternal Grandmother    Hypertension Maternal Grandmother    Hyperlipidemia Maternal Grandfather    Hypertension Maternal Grandfather    Hyperlipidemia Paternal Grandmother    Hypertension Paternal Grandmother     Hyperlipidemia Paternal Grandfather    Hypertension Paternal Grandfather    Heart attack Paternal Grandfather    Sudden death Neg Hx    Colon cancer Neg Hx    Esophageal cancer Neg Hx    Stomach cancer Neg Hx    Rectal cancer Neg Hx     Social History   Socioeconomic History   Marital status: Divorced    Spouse name: Not on file   Number of children: 2   Years of education: Not on file   Highest education level: Not on file  Occupational History   Occupation: FOOTBALL COACH    Employer: [employer] [place] resource strain: Not on file   Food insecurity    Worry: Not on file    Inability: Not on file   Transportation needs    Medical: Not on file    Non-medical: Not on file  Tobacco Use   Smoking status: Never Smoker   Smokeless tobacco: Never Used  Substance and Sexual Activity   Alcohol use: No    Alcohol/week: 0.0 standard drinks   Drug use: No   Sexual activity: Not on file  Lifestyle   Physical activity    Days per week: Not on file    Minutes per session: Not on file   Stress: Not on file  Relationships   Social connections    Talks on phone: Not on file    Gets together: Not on file    Attends religious service: Not on file    Active member of club or organization: Not on file    Attends meetings of clubs or organizations: Not on file    Relationship status: Not on file   Intimate partner violence    Fear of current or ex partner: Not on file    Emotionally abused: Not on file    Physically abused: Not on file    Forced sexual activity: Not on file  Other Topics Concern   Not on file  Social History Narrative   Regular exercise:  No   Caffeine use:  2--32oz cups daily       I appreciate the opportunity to take part in [place] care. Please  do not hesitate to contact me with questions.  Sincerely,   R. Edgar Frisk, MD

## 2019-03-08 ENCOUNTER — Encounter: Payer: Self-pay | Admitting: Family

## 2019-03-08 MED ORDER — VALACYCLOVIR HCL 1 G PO TABS: 1000.0000 mg | ORAL_TABLET | Freq: Every day | ORAL | 5 refills | Status: DC

## 2019-03-17 ENCOUNTER — Other Ambulatory Visit: Payer: Self-pay

## 2019-03-17 ENCOUNTER — Emergency Department (HOSPITAL_BASED_OUTPATIENT_CLINIC_OR_DEPARTMENT_OTHER)

## 2019-03-17 ENCOUNTER — Emergency Department (HOSPITAL_BASED_OUTPATIENT_CLINIC_OR_DEPARTMENT_OTHER)
Admission: EM | Admit: 2019-03-17 | Discharge: 2019-03-17 | Disposition: A | Discharge status: Home / Self Care | Attending: Emergency Medicine | Admitting: Emergency Medicine

## 2019-03-17 ENCOUNTER — Encounter (HOSPITAL_BASED_OUTPATIENT_CLINIC_OR_DEPARTMENT_OTHER): Payer: Self-pay | Admitting: *Deleted

## 2019-03-17 DIAGNOSIS — U071 COVID-19: Secondary | ICD-10-CM | POA: Diagnosis not present

## 2019-03-17 DIAGNOSIS — J189 Pneumonia, unspecified organism: Secondary | ICD-10-CM

## 2019-03-17 DIAGNOSIS — I10 Essential (primary) hypertension: Secondary | ICD-10-CM | POA: Diagnosis not present

## 2019-03-17 DIAGNOSIS — Z79899 Other long term (current) drug therapy: Secondary | ICD-10-CM | POA: Diagnosis not present

## 2019-03-17 DIAGNOSIS — E119 Type 2 diabetes mellitus without complications: Secondary | ICD-10-CM | POA: Diagnosis not present

## 2019-03-17 DIAGNOSIS — J181 Lobar pneumonia, unspecified organism: Secondary | ICD-10-CM | POA: Insufficient documentation

## 2019-03-17 DIAGNOSIS — R509 Fever, unspecified: Secondary | ICD-10-CM | POA: Diagnosis present

## 2019-03-17 LAB — CBC WITH DIFFERENTIAL/PLATELET
Abs Immature Granulocytes: 0.01 10*3/uL (ref 0.00–0.07)
Basophils Absolute: 0 10*3/uL (ref 0.0–0.1)
Basophils Relative: 1 %
Eosinophils Absolute: 0 10*3/uL (ref 0.0–0.5)
Eosinophils Relative: 0 %
HCT: 47.9 % (ref 39.0–52.0)
Hemoglobin: 15.6 g/dL (ref 13.0–17.0)
Immature Granulocytes: 0 %
Lymphocytes Relative: 37 %
Lymphs Abs: 1.5 10*3/uL (ref 0.7–4.0)
MCH: 29.8 pg (ref 26.0–34.0)
MCHC: 32.6 g/dL (ref 30.0–36.0)
MCV: 91.4 fL (ref 80.0–100.0)
Monocytes Absolute: 0.5 10*3/uL (ref 0.1–1.0)
Monocytes Relative: 12 %
Neutro Abs: 2 10*3/uL (ref 1.7–7.7)
Neutrophils Relative %: 50 %
Platelets: 245 10*3/uL (ref 150–400)
RBC: 5.24 MIL/uL (ref 4.22–5.81)
RDW: 12.7 % (ref 11.5–15.5)
WBC: 4 10*3/uL (ref 4.0–10.5)
nRBC: 0 % (ref 0.0–0.2)

## 2019-03-17 LAB — COMPREHENSIVE METABOLIC PANEL
ALT: 32 U/L (ref 0–44)
AST: 32 U/L (ref 15–41)
Albumin: 4.2 g/dL (ref 3.5–5.0)
Alkaline Phosphatase: 94 U/L (ref 38–126)
Anion gap: 9 (ref 5–15)
BUN: 10 mg/dL (ref 6–20)
CO2: 24 mmol/L (ref 22–32)
Calcium: 9 mg/dL (ref 8.9–10.3)
Chloride: 104 mmol/L (ref 98–111)
Creatinine, Ser: 1.15 mg/dL (ref 0.61–1.24)
GFR calc Af Amer: >60 mL/min (ref >60)
GFR calc non Af Amer: >60 mL/min (ref >60)
Glucose, Bld: 94 mg/dL (ref 70–99)
Potassium: 3.5 mmol/L (ref 3.5–5.1)
Sodium: 137 mmol/L (ref 135–145)
Total Bilirubin: 1 mg/dL (ref 0.3–1.2)
Total Protein: 8.1 g/dL (ref 6.5–8.1)

## 2019-03-17 LAB — LIPASE, BLOOD: Lipase: 42 U/L (ref 11–51)

## 2019-03-17 LAB — CBG MONITORING, ED: Glucose-Capillary: 88 mg/dL (ref 70–99)

## 2019-03-17 LAB — LACTIC ACID, PLASMA: Lactic Acid, Venous: 0.8 mmol/L (ref 0.5–1.9)

## 2019-03-17 LAB — D-DIMER, QUANTITATIVE: D-Dimer, Quant: 0.45 ug/mL-FEU (ref 0.00–0.50)

## 2019-03-17 LAB — TROPONIN I (HIGH SENSITIVITY): Troponin I (High Sensitivity): 5 ng/L (ref <18)

## 2019-03-17 MED ORDER — ONDANSETRON 4 MG PO TBDP
4.0000 mg | ORAL_TABLET | Freq: Once | ORAL | Status: AC
  Administered 2019-03-17: 17:00:00 4 mg via ORAL
  Filled 2019-03-17: qty 1

## 2019-03-17 MED ORDER — AMOXICILLIN-POT CLAVULANATE 875-125 MG PO TABS: 1.0000 | ORAL_TABLET | Freq: Two times a day (BID) | ORAL | 0 refills | Status: DC

## 2019-03-17 MED ORDER — DOXYCYCLINE HYCLATE 100 MG PO CAPS: 100.0000 mg | ORAL_CAPSULE | Freq: Two times a day (BID) | ORAL | 0 refills | Status: DC

## 2019-03-17 MED ORDER — ACETAMINOPHEN 325 MG PO TABS
650.0000 mg | ORAL_TABLET | Freq: Once | ORAL | Status: AC
  Administered 2019-03-17: 650 mg via ORAL
  Filled 2019-03-17: qty 2

## 2019-03-17 MED ORDER — AMOXICILLIN-POT CLAVULANATE 875-125 MG PO TABS
1.0000 | ORAL_TABLET | Freq: Once | ORAL | Status: AC
  Administered 2019-03-17: 1 via ORAL
  Filled 2019-03-17: qty 1

## 2019-03-17 MED ORDER — DOXYCYCLINE HYCLATE 100 MG PO TABS
100.0000 mg | ORAL_TABLET | Freq: Once | ORAL | Status: AC
  Administered 2019-03-17: 100 mg via ORAL
  Filled 2019-03-17: qty 1

## 2019-03-17 NOTE — ED Notes (Signed)
Pt maintained O2 sat 97-98% RA while ambulating

## 2019-03-17 NOTE — ED Triage Notes (Signed)
Pt c/o fever , bodyaches x 2 days

## 2019-03-17 NOTE — ED Notes (Signed)
Pt on monitor 

## 2019-03-17 NOTE — ED Provider Notes (Signed)
[facility] EMERGENCY DEPARTMENT Provider Note   CSN: [id number] Arrival date & time: 03/17/19  1621     History   Chief Complaint Chief Complaint  Patient presents with  . Fever    HPI Charles Nash. is a 53 y.o. male.     Patient with a history of GERD, hypertension, sleep apnea, acid reflux, previous gastric sleeve here with body aches and fever x2 days.  States he began to feel poorly last night with body aches and chills.  Developed a temperature to 100.7 today.  Has had nausea and poor appetite but no vomiting.  Denies any abdominal pain, chest pain, shortness of breath.  Does have intermittent dry cough.  Denies headache, runny nose, sore throat, sick contacts.  Denies any known coronavirus exposures.  Denies any pain with urination or blood in the urine.  Initially thought he could be feeling bad because he took his first dose of liraglutide last night which is supposed to help him lose weight he was not sure if he is reacting to that.  He denies any dizziness or lightheadedness.  No chest pain or shortness of breath.  No abdominal pain but has had nausea but no vomiting.  No chest pain   Fever Associated symptoms: chills, myalgias and nausea   Associated symptoms: no congestion, no cough, no dysuria, no headaches, no rash, no rhinorrhea and no vomiting     Past Medical History:  Diagnosis Date  . ADHD (attention deficit hyperactivity disorder)   . Allergy   . Arthritis   . Bilateral varicoceles   . Depression   . Fatty liver 08/06/2011  . Genital herpes 10/25/2014  . GERD (gastroesophageal reflux disease)   . Hyperlipidemia   . Hypertension   . OSA (obstructive sleep apnea)   . Personal history of colonic polyps   . Plantar fasciitis   . PTSD (post-traumatic stress disorder)     Patient Active Problem List   Diagnosis Date Noted  . Recurrent urticaria 03/02/2019  . Mild intermittent asthma 03/02/2019  . Myofascial pain dysfunction syndrome  01/02/2017  . Insect bites 11/10/2016  . Diabetic polyneuropathy associated with type 2 diabetes mellitus ([place]) 08/25/2016  . Achilles tendinitis, left leg 08/25/2016  . Achilles tendon contracture, left 08/25/2016  . Xerosis of skin 04/22/2016  . Chronic urticaria 03/05/2016  . Sacroiliac joint dysfunction of both sides 01/01/2016  . Anxiety state 08/20/2015  . 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 Patellofemoral disorder 05/15/2015  . Current use of beta blocker 05/15/2015  . Genital herpes 10/25/2014  . Depression 10/05/2014  . Osteoarthritis of left knee 08/15/2014  . 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 Sciatica 04/22/2014  . Osteoarthritis of right knee 02/06/2014  . Allergy to mold 12/13/2013  . Weight gain, abnormal 11/08/2013  . PTSD (post-traumatic stress disorder) 07/05/2013  . Multinodular goiter 01/19/2013  . GERD (gastroesophageal reflux disease) 08/09/2012  . Dermographia 12/29/2011  . Neck pain 11/12/2011  . Fatty liver 08/06/2011  . Cervical disc disease 07/28/2011  . Preventative health care 07/28/2011  . Erectile dysfunction 05/23/2011  . Elevated liver function tests 05/20/2011  . Type II diabetes mellitus, well controlled ([place]) 05/19/2011  . Essential hypertension 05/19/2011  . Hyperlipidemia 05/19/2011  . Morbid obesity ([place]) 05/19/2011  . Other allergic rhinitis 05/19/2011  . Obstructive sleep apnea treated with continuous positive airway pressure (CPAP) 05/19/2011    Past Surgical History:  Procedure Laterality Date  . ANKLE SURGERY    .  DOPPLER ECHOCARDIOGRAPHY  2010  . FOOT SURGERY    . KNEE SURGERY  08/04/08   Right knee-- medial meniscus repair, attenuation anterior cruciate [name] MD [facility])   . NM MYOVIEW LTD  2010  . PLANTAR FASCIA RELEASE    . SHOULDER SURGERY    . SLEEVE GASTROPLASTY  10/29/2016  . VARICOCELE EXCISION          Home Medications    Prior to Admission  medications   Medication Sig Start Date End Date Taking? Authorizing Provider  acetaminophen (TYLENOL) 500 MG tablet Take 1,000 mg by mouth every 6 (six) hours as needed.   Yes [provider]  albuterol (VENTOLIN HFA) 108 (90 Base) MCG/ACT inhaler Inhale 2 puffs into the lungs every 4 (four) hours as needed for wheezing or shortness of breath. 03/02/19   Bobbitt, Sedalia Muta, MD  Albuterol Sulfate 108 (90 Base) MCG/ACT AEPB Inhale 1-2 puffs into the lungs every 6 (six) hours as needed (wheezing, sob). 06/30/18   Shelda Pal, DO  atorvastatin (LIPITOR) 10 MG tablet Take 1 tablet (10 mg total) by mouth daily. 11/25/18   Debbrah Alar, NP  buPROPion (WELLBUTRIN XL) 300 MG 24 hr tablet Take 1 tablet (300 mg total) by mouth daily. Patient taking differently: Take 150 mg by mouth daily.  12/26/16   Debbrah Alar, NP  CALCIUM-MAGNESIUM-VITAMIN D PO Take 1 tablet by mouth 3 (three) times daily.    [provider]  cetirizine (ZYRTEC) 10 MG tablet Take 1 tablet (10 mg total) by mouth 2 (two) times daily. 06/17/18   Valentina Shaggy, MD  Cyanocobalamin (VITAMIN B12 PO) Take by mouth.    [provider]  famotidine (PEPCID) 20 MG tablet Take 1 tablet (20 mg total) by mouth 2 (two) times daily. 03/02/19   Bobbitt, Sedalia Muta, MD  fexofenadine (ALLEGRA) 180 MG tablet Take 1 tablet (180 mg total) by mouth daily as needed for allergies or rhinitis. 11/10/16   Bobbitt, Sedalia Muta, MD  fluticasone (FLONASE) 50 MCG/ACT nasal spray Place 2 sprays into both nostrils daily as needed for allergies or rhinitis. 04/29/18   Valentina Shaggy, MD  FOLIC ACID PO Take by mouth.    [provider]  hydrALAZINE (APRESOLINE) 50 MG tablet TAKE 1 TABLET THREE TIMES A DAY 08/24/17   Debbrah Alar, NP  levocetirizine (XYZAL) 5 MG tablet One or two tablets in the morning and one or two tablets in the evening as needed.1 03/02/19   Bobbitt, Sedalia Muta, MD   Liraglutide -Weight Management (SAXENDA) 18 MG/3ML SOPN Inject into the skin. 03/01/19   [provider]  losartan (COZAAR) 50 MG tablet Take 1 tablet (50 mg total) by mouth daily. 07/17/17   Debbrah Alar, NP  metoprolol succinate (TOPROL XL) 100 MG 24 hr tablet Take 1 tablet (100 mg total) by mouth 2 (two) times daily. Take with or immediately following a meal. 02/08/19   Debbrah Alar, NP  Multiple Vitamin (MULTI-VITAMINS) TABS Take 1 tablet by mouth daily.    [provider]  omeprazole (PRILOSEC) 20 MG capsule Take 1 capsule (20 mg total) by mouth 2 (two) times daily before a meal. 11/25/18   Debbrah Alar, NP  polyethylene glycol (MIRALAX / GLYCOLAX) packet  10/30/16   [provider]  QUEtiapine (SEROQUEL) 25 MG tablet Take 1 tablet (25 mg total) by mouth at bedtime. 08/06/17   Debbrah Alar, NP  sildenafil (VIAGRA) 100 MG tablet TAKE 1/2 TO 1 TABLET BY  MOUTH DAILY AS NEEDED 03/13/17   Debbrah Alar, NP  triamcinolone ointment (KENALOG) 0.1 % Apply to red itchy areas only below the face 03/02/19   Bobbitt, Sedalia Muta, MD  valACYclovir (VALTREX) 1000 MG tablet Take 1 tablet (1,000 mg total) by mouth daily. 03/08/19   Debbrah Alar, NP  VYVANSE 20 MG capsule  04/10/17   [provider]  amLODipine (NORVASC) 5 MG tablet Take 1 tablet (5 mg total) by mouth daily. 07/17/17 06/11/18  Debbrah Alar, NP  potassium chloride (K-DUR) 10 MEQ tablet Take 1 tablet (10 mEq total) by mouth daily. 05/19/11 07/11/11  Debbrah Alar, NP    Family History Family History  Problem Relation Age of Onset  . Diabetes Mother   . Hypertension Mother   . Arthritis Mother   . Cancer Mother        uterine and breast cancer, sarcoma of the brain  . Hyperlipidemia Father   . Arthritis Father   . Cancer Father        thyroid, prostate cancer  . Other Father        mass in stomach  . Hyperlipidemia Maternal Grandmother   . Hypertension  Maternal Grandmother   . Hyperlipidemia Maternal Grandfather   . Hypertension Maternal Grandfather   . Hyperlipidemia Paternal Grandmother   . Hypertension Paternal Grandmother   . Hyperlipidemia Paternal Grandfather   . Hypertension Paternal Grandfather   . Heart attack Paternal Grandfather   . Sudden death Neg Hx   . Colon cancer Neg Hx   . Esophageal cancer Neg Hx   . Stomach cancer Neg Hx   . Rectal cancer Neg Hx     Social History Social History   Tobacco Use  . Smoking status: Never Smoker  . Smokeless tobacco: Never Used  Substance Use Topics  . Alcohol use: No    Alcohol/week: 0.0 standard drinks  . Drug use: No     Allergies   Oxycodone-acetaminophen and Zoloft [sertraline hcl]   Review of Systems Review of Systems  Constitutional: Positive for activity change, appetite change, chills, fatigue and fever.  HENT: Negative for congestion and rhinorrhea.   Respiratory: Negative for cough, chest tightness and shortness of breath.   Gastrointestinal: Positive for nausea. Negative for abdominal pain and vomiting.  Genitourinary: Negative for dysuria and hematuria.  Musculoskeletal: Positive for arthralgias and myalgias.  Skin: Negative for rash.  Neurological: Positive for weakness. Negative for dizziness, light-headedness and headaches.   all other systems are negative except as noted in the HPI and PMH.     Physical Exam Updated Vital Signs BP (!) 149/92   Pulse 92   Temp (!) 100.6 F (38.1 C)   Resp 16   Ht 6' (1.829 m)   Wt 135.2 kg   SpO2 100%   BMI 40.42 kg/m   Physical Exam Vitals signs and nursing note reviewed.  Constitutional:      General: He is not in acute distress.    Appearance: He is well-developed. He is obese. He is not ill-appearing.  HENT:     Head: Normocephalic and atraumatic.     Mouth/Throat:     Mouth: Mucous membranes are moist.     Pharynx: No oropharyngeal exudate.     Comments: No exudates, no asymmetry, no uvular  swelling Eyes:     Conjunctiva/sclera: Conjunctivae normal.     Pupils: Pupils are equal, round, and reactive to light.  Neck:     Musculoskeletal: Normal range of motion and  neck supple.     Comments: No meningismus. Cardiovascular:     Rate and Rhythm: Normal rate and regular rhythm.     Heart sounds: Normal heart sounds. No murmur.  Pulmonary:     Effort: Pulmonary effort is normal. No respiratory distress.     Breath sounds: Normal breath sounds.  Chest:     Chest wall: No tenderness.  Abdominal:     Palpations: Abdomen is soft.     Tenderness: There is no abdominal tenderness. There is no guarding or rebound.  Musculoskeletal: Normal range of motion.        General: No tenderness.  Skin:    General: Skin is warm.     Capillary Refill: Capillary refill takes less than 2 seconds.  Neurological:     General: No focal deficit present.     Mental Status: He is alert and oriented to person, place, and time. Mental status is at baseline.     Cranial Nerves: No cranial nerve deficit.     Motor: No abnormal muscle tone.     Coordination: Coordination normal.     Comments: No ataxia on finger to nose bilaterally. No pronator drift. 5/5 strength throughout. CN 2-12 intact.Equal grip strength. Sensation intact.   Psychiatric:        Behavior: Behavior normal.      ED Treatments / Results  Labs (all labs ordered are listed, but only abnormal results are displayed) Labs Reviewed  NOVEL CORONAVIRUS, NAA (HOSP ORDER, SEND-OUT TO REF LAB; TAT 18-24 HRS)  CULTURE, BLOOD (ROUTINE X 2)  CULTURE, BLOOD (ROUTINE X 2)  CBC WITH DIFFERENTIAL/PLATELET  COMPREHENSIVE METABOLIC PANEL  LIPASE, BLOOD  LACTIC ACID, PLASMA  D-DIMER, QUANTITATIVE (NOT AT [facility])  LACTIC ACID, PLASMA  CBG MONITORING, ED  TROPONIN I (HIGH SENSITIVITY)  TROPONIN I (HIGH SENSITIVITY)    EKG EKG Interpretation  Date/Time:  Thursday March 17 2019 16:56:28 EST Ventricular Rate:  88 PR Interval:    QRS  Duration: 104 QT Interval:  393 QTC Calculation: 476 R Axis:   100 Text Interpretation: Sinus rhythm Consider left atrial enlargement Right axis deviation Borderline prolonged QT interval No significant change was found Confirmed by Ezequiel Essex [id number]) on 03/17/2019 5:02:43 PM   Radiology Dg Chest Portable 1 View  Result Date: 03/17/2019 CLINICAL DATA:  Cough, fever EXAM: PORTABLE CHEST 1 VIEW COMPARISON:  01/07/2019 chest radiograph. FINDINGS: Stable cardiomediastinal silhouette with normal heart size. No pneumothorax. No pleural effusion. New hazy peripheral mid to lower left lung opacity. IMPRESSION: New hazy opacity in the peripheral mid to lower left lung, cannot exclude developing pneumonia. Chest radiograph follow-up advised. Electronically Signed   By: Ilona Sorrel M.D.   On: 03/17/2019 17:12    Procedures Procedures (including critical care time)  Medications Ordered in ED Medications  acetaminophen (TYLENOL) tablet 650 mg (has no administration in time range)  ondansetron (ZOFRAN-ODT) disintegrating tablet 4 mg (has no administration in time range)     Initial Impression / Assessment and Plan / ED Course  I have reviewed the triage vital signs and the nursing notes.  Pertinent labs & imaging results that were available during my care of the patient were reviewed by me and considered in my medical decision making (see chart for details).       2 days of generalized weakness, fever, chills, fatigue, nausea and cough.  No known coronavirus exposures.  Patient appears nontoxic and in no respiratory distress.  He does have a fever on arrival.  His labs are reassuring with normal glucose and creatinine.  EKG is nonischemic.  Chest x-ray is concerning for possible left-sided airspace opacity.  Covid testing sent.  Patient ambulatory without desaturation.  His D-dimer is negative.  Will treat for suspected pneumonia with antibiotics.  He feels comfortable going home and  is in no respiratory distress and has no hypoxia.  He is aware he should be quarantined while his coronavirus test is pending.  Return precautions discussed including worsening shortness of breath, chest pain, not able to eat or drink or any other concerns.    Charles Nash. was evaluated in Emergency Department on 03/17/2019 for the symptoms described in the history of present illness. He was evaluated in the context of the global COVID-19 pandemic, which necessitated consideration that the patient might be at risk for infection with the SARS-CoV-2 virus that causes COVID-19. Institutional protocols and algorithms that pertain to the evaluation of patients at risk for COVID-19 are in a state of rapid change based on information released by regulatory bodies including the CDC and federal and state organizations. These policies and algorithms were followed during the patient's care in the ED.   Final Clinical Impressions(s) / ED Diagnoses   Final diagnoses:  Community acquired pneumonia of left lower lobe of lung    ED Discharge Orders    None       Marycatherine Maniscalco, Annie Main, MD 03/17/19 1929

## 2019-03-17 NOTE — Discharge Instructions (Signed)
Take the antibiotics for the pneumonia as we discussed.  Keep yourself quarantined while your coronavirus test is pending.  Follow-up with your doctor.  Return to the ED with difficulty breathing, chest pain, vomiting, abdominal pain, or other concerns.

## 2019-03-18 ENCOUNTER — Telehealth: Payer: Self-pay | Admitting: *Deleted

## 2019-03-18 NOTE — Telephone Encounter (Signed)
Pt called regarding his covid-19 test result. He had viewed it on MyChart and did not understand what it meant. He is advised that he is positive for covid 19. He had gone to the ED yesterday and had the test done and was diagnosed with pneumonia. He has been prescribed with antibiotics. Reviewed signs and symptoms of covid.  You should treat symptoms with OTC medications. You should also notify your PCP. Get fresh air, hydrated and get rest. Should be able to go outside your house, alone. Call 911 for respiratory distress (struggling to breath).  Remember to wear your mask, stay at home, social distant and clean the hard surfaces in your house. And all family members if they have not been tested, should be tested. Pt voiced understanding. Will notify the [place]. Will route to [facility] at [facility] for review.

## 2019-03-19 ENCOUNTER — Telehealth: Payer: Self-pay | Admitting: Nurse Practitioner

## 2019-03-19 NOTE — Telephone Encounter (Signed)
Attempt to call patient to discuss about Covid symptoms and the use of bamlanivimab, a monoclonal antibody infusion for those with mild to moderate Covid symptoms and at a high risk of hospitalization.  Pt is qualified for this infusion at the [facility] infusion center if symptomatic due to T2DM and obesity.  No answer to call and mailbox full, unable to leave message.

## 2019-03-20 ENCOUNTER — Emergency Department (HOSPITAL_BASED_OUTPATIENT_CLINIC_OR_DEPARTMENT_OTHER)
Admission: EM | Admit: 2019-03-20 | Discharge: 2019-03-20 | Disposition: A | Discharge status: Home / Self Care | Attending: Emergency Medicine | Admitting: Emergency Medicine

## 2019-03-20 ENCOUNTER — Other Ambulatory Visit: Payer: Self-pay

## 2019-03-20 ENCOUNTER — Encounter (HOSPITAL_BASED_OUTPATIENT_CLINIC_OR_DEPARTMENT_OTHER): Payer: Self-pay

## 2019-03-20 ENCOUNTER — Emergency Department (HOSPITAL_BASED_OUTPATIENT_CLINIC_OR_DEPARTMENT_OTHER)

## 2019-03-20 DIAGNOSIS — R0602 Shortness of breath: Secondary | ICD-10-CM | POA: Diagnosis not present

## 2019-03-20 DIAGNOSIS — Z888 Allergy status to other drugs, medicaments and biological substances status: Secondary | ICD-10-CM | POA: Diagnosis not present

## 2019-03-20 DIAGNOSIS — M546 Pain in thoracic spine: Secondary | ICD-10-CM | POA: Diagnosis present

## 2019-03-20 DIAGNOSIS — U071 COVID-19: Secondary | ICD-10-CM | POA: Diagnosis not present

## 2019-03-20 DIAGNOSIS — E119 Type 2 diabetes mellitus without complications: Secondary | ICD-10-CM | POA: Insufficient documentation

## 2019-03-20 DIAGNOSIS — I10 Essential (primary) hypertension: Secondary | ICD-10-CM | POA: Insufficient documentation

## 2019-03-20 DIAGNOSIS — Z7984 Long term (current) use of oral hypoglycemic drugs: Secondary | ICD-10-CM | POA: Insufficient documentation

## 2019-03-20 DIAGNOSIS — Z79899 Other long term (current) drug therapy: Secondary | ICD-10-CM | POA: Insufficient documentation

## 2019-03-20 DIAGNOSIS — E785 Hyperlipidemia, unspecified: Secondary | ICD-10-CM | POA: Diagnosis not present

## 2019-03-20 DIAGNOSIS — Z885 Allergy status to narcotic agent status: Secondary | ICD-10-CM | POA: Insufficient documentation

## 2019-03-20 LAB — CBC WITH DIFFERENTIAL/PLATELET
Abs Immature Granulocytes: 0.01 10*3/uL (ref 0.00–0.07)
Basophils Absolute: 0 10*3/uL (ref 0.0–0.1)
Basophils Relative: 0 %
Eosinophils Absolute: 0 10*3/uL (ref 0.0–0.5)
Eosinophils Relative: 0 %
HCT: 45.6 % (ref 39.0–52.0)
Hemoglobin: 15 g/dL (ref 13.0–17.0)
Immature Granulocytes: 0 %
Lymphocytes Relative: 43 %
Lymphs Abs: 1.8 10*3/uL (ref 0.7–4.0)
MCH: 29.7 pg (ref 26.0–34.0)
MCHC: 32.9 g/dL (ref 30.0–36.0)
MCV: 90.3 fL (ref 80.0–100.0)
Monocytes Absolute: 0.4 10*3/uL (ref 0.1–1.0)
Monocytes Relative: 9 %
Neutro Abs: 2 10*3/uL (ref 1.7–7.7)
Neutrophils Relative %: 48 %
Platelets: 247 10*3/uL (ref 150–400)
RBC: 5.05 MIL/uL (ref 4.22–5.81)
RDW: 12.5 % (ref 11.5–15.5)
WBC: 4.2 10*3/uL (ref 4.0–10.5)
nRBC: 0 % (ref 0.0–0.2)

## 2019-03-20 LAB — BASIC METABOLIC PANEL
Anion gap: 9 (ref 5–15)
BUN: 10 mg/dL (ref 6–20)
CO2: 24 mmol/L (ref 22–32)
Calcium: 9.3 mg/dL (ref 8.9–10.3)
Chloride: 105 mmol/L (ref 98–111)
Creatinine, Ser: 1.05 mg/dL (ref 0.61–1.24)
GFR calc Af Amer: >60 mL/min (ref >60)
GFR calc non Af Amer: >60 mL/min (ref >60)
Glucose, Bld: 128 mg/dL — ABNORMAL HIGH (ref 70–99)
Potassium: 3.4 mmol/L — ABNORMAL LOW (ref 3.5–5.1)
Sodium: 138 mmol/L (ref 135–145)

## 2019-03-20 LAB — D-DIMER, QUANTITATIVE: D-Dimer, Quant: 0.5 ug/mL-FEU (ref 0.00–0.50)

## 2019-03-20 LAB — TROPONIN I (HIGH SENSITIVITY)
Troponin I (High Sensitivity): 4 ng/L (ref <18)
Troponin I (High Sensitivity): 4 ng/L (ref <18)

## 2019-03-20 MED ORDER — SODIUM CHLORIDE 0.9 % IV BOLUS
1000.0000 mL | Freq: Once | INTRAVENOUS | Status: AC
  Administered 2019-03-20: 19:00:00 1000 mL via INTRAVENOUS

## 2019-03-20 MED ORDER — ONDANSETRON HCL 4 MG/2ML IJ SOLN: 4.0000 mg | Freq: Once | INTRAMUSCULAR | Status: DC

## 2019-03-20 MED ORDER — IOHEXOL 350 MG/ML SOLN
100.0000 mL | Freq: Once | INTRAVENOUS | Status: AC | PRN
  Administered 2019-03-20: 100 mL via INTRAVENOUS

## 2019-03-20 NOTE — Discharge Instructions (Signed)
Take Tylenol and ibuprofen for body aches and pains and fever.  Do not take more than 4000 mg Tylenol more than 2400 mg ibuprofen in 24 hours period.  Make sure to drink plenty of fluids

## 2019-03-20 NOTE — ED Triage Notes (Signed)
Pt. C/o increased pain to back after being dx with Covid on Friday. Pt also c/o weakness and fatigue.

## 2019-03-20 NOTE — ED Provider Notes (Signed)
[facility] EMERGENCY DEPARTMENT Provider Note   CSN: [id number] Arrival date & time: 03/20/19  1813   History   Chief Complaint Chief Complaint  Patient presents with  . Back Pain    COVID +   HPI Charles Nash. is a 53 y.o. male with past medical history significant for ADHA, OSA, HTN who presents for evaluation of back pain.  Patient states he has been having sharp back pain located to his bilateral thoracic region started this morning.  Patient states he has been taking Tylenol for his fever at home.  States she has had a "twinge" in his chest.  States he has acid reflux and this feels similar.  He denies any pressure, radiation chest pain to his back, left arm or jaw.  Denies headache, neck pain, neck stiffness, congestion, rhinorrhea, unilateral weakness, shortness of breath, swelling to lower extremities, history of PE, DVT.  No recent surgery, immobilization.  Denies additional aggravating or alleviating factors.  He has had some mild nausea without emesis.  He has been able to tolerate p.o. intake at home without difficulty. No IVDU, bowel or bladder incontinence, hx malignancy, saddle paresthesia.  History obtained from patient and past medical records.  No interpreter is used.    HPI  Past Medical History:  Diagnosis Date  . ADHD (attention deficit hyperactivity disorder)   . Allergy   . Arthritis   . Bilateral varicoceles   . Depression   . Fatty liver 08/06/2011  . Genital herpes 10/25/2014  . GERD (gastroesophageal reflux disease)   . Hyperlipidemia   . Hypertension   . OSA (obstructive sleep apnea)   . Personal history of colonic polyps   . Plantar fasciitis   . PTSD (post-traumatic stress disorder)     Patient Active Problem List   Diagnosis Date Noted  . Recurrent urticaria 03/02/2019  . Mild intermittent asthma 03/02/2019  . Myofascial pain dysfunction syndrome 01/02/2017  . Insect bites 11/10/2016  . Diabetic polyneuropathy associated with  type 2 diabetes mellitus ([place]) 08/25/2016  . Achilles tendinitis, left leg 08/25/2016  . Achilles tendon contracture, left 08/25/2016  . Xerosis of skin 04/22/2016  . Chronic urticaria 03/05/2016  . Sacroiliac joint dysfunction of both sides 01/01/2016  . Anxiety state 08/20/2015  . 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 Patellofemoral disorder 05/15/2015  . Current use of beta blocker 05/15/2015  . Genital herpes 10/25/2014  . Depression 10/05/2014  . Osteoarthritis of left knee 08/15/2014  . 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 Sciatica 04/22/2014  . Osteoarthritis of right knee 02/06/2014  . Allergy to mold 12/13/2013  . Weight gain, abnormal 11/08/2013  . PTSD (post-traumatic stress disorder) 07/05/2013  . Multinodular goiter 01/19/2013  . GERD (gastroesophageal reflux disease) 08/09/2012  . Dermographia 12/29/2011  . Neck pain 11/12/2011  . Fatty liver 08/06/2011  . Cervical disc disease 07/28/2011  . Preventative health care 07/28/2011  . Erectile dysfunction 05/23/2011  . Elevated liver function tests 05/20/2011  . Type II diabetes mellitus, well controlled ([place]) 05/19/2011  . Essential hypertension 05/19/2011  . Hyperlipidemia 05/19/2011  . Morbid obesity ([place]) 05/19/2011  . Other allergic rhinitis 05/19/2011  . Obstructive sleep apnea treated with continuous positive airway pressure (CPAP) 05/19/2011    Past Surgical History:  Procedure Laterality Date  . ANKLE SURGERY    . DOPPLER ECHOCARDIOGRAPHY  2010  . FOOT SURGERY    . KNEE SURGERY  08/04/08   Right knee-- medial meniscus repair, attenuation  anterior cruciate [name] MD [facility])   . NM MYOVIEW LTD  2010  . PLANTAR FASCIA RELEASE    . SHOULDER SURGERY    . SLEEVE GASTROPLASTY  10/29/2016  . VARICOCELE EXCISION        Home Medications    Prior to Admission medications   Medication Sig Start Date End Date Taking? Authorizing Provider  acetaminophen  (TYLENOL) 500 MG tablet Take 1,000 mg by mouth every 6 (six) hours as needed.    [provider]  albuterol (VENTOLIN HFA) 108 (90 Base) MCG/ACT inhaler Inhale 2 puffs into the lungs every 4 (four) hours as needed for wheezing or shortness of breath. 03/02/19   Bobbitt, Sedalia Muta, MD  Albuterol Sulfate 108 (90 Base) MCG/ACT AEPB Inhale 1-2 puffs into the lungs every 6 (six) hours as needed (wheezing, sob). 06/30/18   Shelda Pal, DO  amoxicillin-clavulanate (AUGMENTIN) 875-125 MG tablet Take 1 tablet by mouth every 12 (twelve) hours. 03/17/19   Rancour, Annie Main, MD  atorvastatin (LIPITOR) 10 MG tablet Take 1 tablet (10 mg total) by mouth daily. 11/25/18   Debbrah Alar, NP  buPROPion (WELLBUTRIN XL) 300 MG 24 hr tablet Take 1 tablet (300 mg total) by mouth daily. Patient taking differently: Take 150 mg by mouth daily.  12/26/16   Debbrah Alar, NP  CALCIUM-MAGNESIUM-VITAMIN D PO Take 1 tablet by mouth 3 (three) times daily.    [provider]  cetirizine (ZYRTEC) 10 MG tablet Take 1 tablet (10 mg total) by mouth 2 (two) times daily. 06/17/18   Valentina Shaggy, MD  Cyanocobalamin (VITAMIN B12 PO) Take by mouth.    [provider]  doxycycline (VIBRAMYCIN) 100 MG capsule Take 1 capsule (100 mg total) by mouth 2 (two) times daily. 03/17/19   Rancour, Annie Main, MD  famotidine (PEPCID) 20 MG tablet Take 1 tablet (20 mg total) by mouth 2 (two) times daily. 03/02/19   Bobbitt, Sedalia Muta, MD  fexofenadine (ALLEGRA) 180 MG tablet Take 1 tablet (180 mg total) by mouth daily as needed for allergies or rhinitis. 11/10/16   Bobbitt, Sedalia Muta, MD  fluticasone (FLONASE) 50 MCG/ACT nasal spray Place 2 sprays into both nostrils daily as needed for allergies or rhinitis. 04/29/18   Valentina Shaggy, MD  FOLIC ACID PO Take by mouth.    [provider]  hydrALAZINE (APRESOLINE) 50 MG tablet TAKE 1 TABLET THREE TIMES A DAY 08/24/17   Debbrah Alar, NP  levocetirizine (XYZAL) 5 MG tablet One or two tablets in the morning and one or two tablets in the evening as needed.1 03/02/19   Bobbitt, Sedalia Muta, MD  Liraglutide -Weight Management (SAXENDA) 18 MG/3ML SOPN Inject into the skin. 03/01/19   [provider]  losartan (COZAAR) 50 MG tablet Take 1 tablet (50 mg total) by mouth daily. 07/17/17   Debbrah Alar, NP  metoprolol succinate (TOPROL XL) 100 MG 24 hr tablet Take 1 tablet (100 mg total) by mouth 2 (two) times daily. Take with or immediately following a meal. 02/08/19   Debbrah Alar, NP  Multiple Vitamin (MULTI-VITAMINS) TABS Take 1 tablet by mouth daily.    [provider]  omeprazole (PRILOSEC) 20 MG capsule Take 1 capsule (20 mg total) by mouth 2 (two) times daily before a meal. 11/25/18   Debbrah Alar, NP  polyethylene glycol (MIRALAX / GLYCOLAX) packet  10/30/16   [provider]  QUEtiapine (SEROQUEL) 25 MG tablet Take 1 tablet (25 mg total) by mouth at  bedtime. 08/06/17   Debbrah Alar, NP  sildenafil (VIAGRA) 100 MG tablet TAKE 1/2 TO 1 TABLET BY MOUTH DAILY AS NEEDED 03/13/17   Debbrah Alar, NP  triamcinolone ointment (KENALOG) 0.1 % Apply to red itchy areas only below the face 03/02/19   Bobbitt, Sedalia Muta, MD  valACYclovir (VALTREX) 1000 MG tablet Take 1 tablet (1,000 mg total) by mouth daily. 03/08/19   Debbrah Alar, NP  VYVANSE 20 MG capsule  04/10/17   [provider]  amLODipine (NORVASC) 5 MG tablet Take 1 tablet (5 mg total) by mouth daily. 07/17/17 06/11/18  Debbrah Alar, NP  potassium chloride (K-DUR) 10 MEQ tablet Take 1 tablet (10 mEq total) by mouth daily. 05/19/11 07/11/11  Debbrah Alar, NP    Family History Family History  Problem Relation Age of Onset  . Diabetes Mother   . Hypertension Mother   . Arthritis Mother   . Cancer Mother        uterine and breast cancer, sarcoma of the brain  . Hyperlipidemia Father   .  Arthritis Father   . Cancer Father        thyroid, prostate cancer  . Other Father        mass in stomach  . Hyperlipidemia Maternal Grandmother   . Hypertension Maternal Grandmother   . Hyperlipidemia Maternal Grandfather   . Hypertension Maternal Grandfather   . Hyperlipidemia Paternal Grandmother   . Hypertension Paternal Grandmother   . Hyperlipidemia Paternal Grandfather   . Hypertension Paternal Grandfather   . Heart attack Paternal Grandfather   . Sudden death Neg Hx   . Colon cancer Neg Hx   . Esophageal cancer Neg Hx   . Stomach cancer Neg Hx   . Rectal cancer Neg Hx     Social History Social History   Tobacco Use  . Smoking status: Never Smoker  . Smokeless tobacco: Never Used  Substance Use Topics  . Alcohol use: No    Alcohol/week: 0.0 standard drinks  . Drug use: No    Allergies   Oxycodone-acetaminophen and Zoloft [sertraline hcl]   Review of Systems Review of Systems  Constitutional: Negative.   HENT: Negative.   Respiratory: Negative.   Cardiovascular: Negative for palpitations and leg swelling.       Thoracic back pain, pleuritic  Gastrointestinal: Positive for nausea. Negative for abdominal distention, abdominal pain, anal bleeding, blood in stool, constipation, diarrhea, rectal pain and vomiting.  Genitourinary: Negative.   Musculoskeletal: Negative.   Neurological: Negative.   Hematological: Negative.   All other systems reviewed and are negative.   Physical Exam Updated Vital Signs BP (!) 143/95 (BP Location: Left Arm)   Pulse 77   Temp 98.6 F (37 C) (Oral)   Resp 20   Ht 6' (1.829 m)   Wt 134.5 kg   SpO2 97%   BMI 40.21 kg/m   Physical Exam Vitals signs and nursing note reviewed.  Constitutional:      General: He is not in acute distress.    Appearance: He is well-developed. He is not ill-appearing, toxic-appearing or diaphoretic.  HENT:     Head: Normocephalic and atraumatic.     Jaw: There is normal jaw occlusion.      Nose: Nose normal.     Mouth/Throat:     Lips: Pink.     Mouth: Mucous membranes are moist.     Pharynx: Oropharynx is clear.  Eyes:     Pupils: Pupils are equal, round, and reactive to  light.  Neck:     Musculoskeletal: Full passive range of motion without pain, normal range of motion and neck supple.     Trachea: Phonation normal.  Cardiovascular:     Rate and Rhythm: Normal rate and regular rhythm.     Pulses: Normal pulses.          Radial pulses are 2+ on the right side and 2+ on the left side.       Dorsalis pedis pulses are 2+ on the right side and 2+ on the left side.       Posterior tibial pulses are 2+ on the right side and 2+ on the left side.     Heart sounds: Normal heart sounds.  Pulmonary:     Effort: Pulmonary effort is normal. Tachypnea present. No respiratory distress.     Breath sounds: Normal breath sounds and air entry.     Comments: Mild tachypnea to 22 in room Abdominal:     General: There is no distension.     Palpations: Abdomen is soft.     Comments: Soft non tender without rebound or guarding  Musculoskeletal: Normal range of motion.     Thoracic back: He exhibits tenderness. He exhibits normal range of motion, no bony tenderness, no swelling, no edema, no deformity, no laceration, no pain, no spasm and normal pulse.       Back:     Comments: Midline spinal tenderness palpation.  He is tender to bilateral inferior scapulas.  Lymphadenopathy:     Cervical: No cervical adenopathy.  Skin:    General: Skin is warm and dry.     Capillary Refill: Capillary refill takes less than 2 seconds.     Comments: Brisk cap refill  Neurological:     Mental Status: He is alert.     Comments: Intact sensation to bilateral extremities    ED Treatments / Results  Labs (all labs ordered are listed, but only abnormal results are displayed) Labs Reviewed  BASIC METABOLIC PANEL - Abnormal; Notable for the following components:      Result Value   Potassium 3.4 (*)     Glucose, Bld 128 (*)    All other components within normal limits  CBC WITH DIFFERENTIAL/PLATELET  D-DIMER, QUANTITATIVE (NOT AT [facility])  TROPONIN I (HIGH SENSITIVITY)  TROPONIN I (HIGH SENSITIVITY)    EKG EKG Interpretation  Date/Time:  Sunday March 20 2019 18:54:14 EST Ventricular Rate:  85 PR Interval:    QRS Duration: 96 QT Interval:  399 QTC Calculation: 475 R Axis:   83 Text Interpretation: Sinus rhythm Abnormal R-wave progression, late transition Confirmed by Quintella Reichert [phone]) on 03/20/2019 6:55:34 PM   Radiology Ct Angio Chest Pe W And/or Wo Contrast  Result Date: 03/20/2019 CLINICAL DATA:  53 year old male with shortness of breath. EXAM: CT ANGIOGRAPHY CHEST WITH CONTRAST TECHNIQUE: Multidetector CT imaging of the chest was performed using the standard protocol during bolus administration of intravenous contrast. Multiplanar CT image reconstructions and MIPs were obtained to evaluate the vascular anatomy. CONTRAST:  175mL OMNIPAQUE IOHEXOL 350 MG/ML SOLN COMPARISON:  Chest radiograph dated 03/17/2019. FINDINGS: Cardiovascular: There is no cardiomegaly or pericardial effusion. The thoracic aorta is unremarkable. The origins of the great vessels of the aortic arch appear patent as visualized. Evaluation of the pulmonary arteries is limited due to suboptimal opacification and respiratory motion artifact. No large or central pulmonary artery embolus identified. Mediastinum/Nodes: There is no hilar or mediastinal adenopathy. The esophagus and the thyroid gland are  grossly unremarkable. No mediastinal fluid collection. Lungs/Pleura: Clusters of ground-glass density primarily involving the lingula as well as right lung base most consistent with multifocal pneumonia. Focal area of infarct at the right lung base is not entirely excluded. Clinical correlation and follow-up recommended. There is a trace right pleural effusion. There is a 4 mm right lower lobe subpleural nodule (series 7,  image 53). Attention on follow-up imaging recommended. No pneumothorax. The central airways are patent. Upper Abdomen: Postsurgical changes of gastric sleeve. There is background of fatty infiltration of the liver. The visualized upper abdomen is otherwise unremarkable. Musculoskeletal: Degenerative changes of the spine. No acute osseous pathology. Review of the MIP images confirms the above findings. IMPRESSION: 1. No CT evidence of central pulmonary artery embolus. 2. Findings most consistent with multifocal pneumonia. Clinical correlation and follow-up recommended. 3. Trace right pleural effusion. Electronically Signed   By: Anner Crete M.D.   On: 03/20/2019 20:28    Procedures Procedures (including critical care time)  Medications Ordered in ED Medications  ondansetron (ZOFRAN) injection 4 mg (has no administration in time range)  sodium chloride 0.9 % bolus 1,000 mL (0 mLs Intravenous Stopped 03/20/19 2124)  iohexol (OMNIPAQUE) 350 MG/ML injection 100 mL (100 mLs Intravenous Contrast Given 03/20/19 1949)   Initial Impression / Assessment and Plan / ED Course  I have reviewed the triage vital signs and the nursing notes.  Pertinent labs & imaging results that were available during my care of the patient were reviewed by me and considered in my medical decision making (see chart for details).  53 year old male appears otherwise well presents for evaluation of sharp thoracic back pain.  He is known Covid positive.  States he did feel a "twinge" in his chest for denies overt chest pain.  No pain radiating to his left arm or jaw.  Has had persistent nausea since his Covid diagnosis 3 days ago.  He is tolerating p.o. intake at home.  He is mildly tachypneic to 22 in room however is not tachycardic or hypoxic.  Heart and lungs are clear.  2+ radial and DP pulses bilaterally.  Abdomen soft, nontender.  Plan for labs, imaging, EKG and reevaluate.  Labs and imaging personally reviewed:  Clinical  Course as of Mar 19 2209  Nancy Fetter Mar 20, 2019  2206 Delta trop negative  Troponin I (High Sensitivity) [BH]  2206 Mild hypokalemia, mild elevation in glucose, no additional electrolyte, renal or liver abnormality  Basic metabolic panel(!) [BH]  0000000 negative  D-dimer, quantitative (not at [facility]) [BH]  2206 CBC without leukocytosis, hemoglobin 15  CBC with Differential [BH]  2207 Bilateral multifocal pneumonia, no PE.  Likely Covid pneumonia  CT Angio Chest PE W and/or Wo Contrast [BH]  2207 No ST/T changes. No STEMI  EKG 12-Lead [BH]    Clinical Course User Index [BH] Jennilyn Esteve A, PA-C   Patient reevaluated.  He is without complaints.  Tolerating p.o. intake.  No red flags for back pain.  His CT scan showed multifocal pneumonia, given his known Covid diagnosis likely viral in etiology.  Heart score 2, age, risk factors.  I have low suspicion for ACS, PE, dissection, or carditis, pericarditis as cause of his symptoms.  Likely musculoskeletal in nature given I can reproduce his pain.  Patient able to ambulate oxygen saturations greater than 95% on room air.  He has no tachycardia, tachypnea or hypoxia.  He appears overall well.  Patient to follow-up with PCP for reevaluation.  Discussed strict  return precautions.  Patient voiced understanding and is agreeable for follow-up.    Charles Nash. was evaluated in Emergency Department on 03/20/2019 for the symptoms described in the history of present illness. He was evaluated in the context of the global COVID-19 pandemic, which necessitated consideration that the patient might be at risk for infection with the SARS-CoV-2 virus that causes COVID-19. Institutional protocols and algorithms that pertain to the evaluation of patients at risk for COVID-19 are in a state of rapid change based on information released by regulatory bodies including the CDC and federal and state organizations. These policies and algorithms were followed during the  patient's care in the ED. Final Clinical Impressions(s) / ED Diagnoses   Final diagnoses:  [id number]  Acute bilateral thoracic back pain    ED Discharge Orders    None       Zana Biancardi A, PA-C 03/20/19 2210    Quintella Reichert, MD 03/20/19 2319

## 2019-03-21 ENCOUNTER — Telehealth: Payer: Self-pay | Admitting: Family

## 2019-03-21 ENCOUNTER — Telehealth (HOSPITAL_COMMUNITY): Payer: Self-pay

## 2019-03-21 LAB — NOVEL CORONAVIRUS, NAA (HOSP ORDER, SEND-OUT TO REF LAB; TAT 18-24 HRS): SARS-CoV-2, NAA: DETECTED — AB

## 2019-03-21 NOTE — Telephone Encounter (Signed)
Please contact pt to schedule a follow up virtual visit for his PNA Covid-19 infection.  He should remain on quarantine until further recommendations from [place].

## 2019-03-21 NOTE — Telephone Encounter (Signed)
Patient scheduled for virtual follow up on 03-22-2020

## 2019-03-22 LAB — CULTURE, BLOOD (ROUTINE X 2)
Culture: NO GROWTH
Culture: NO GROWTH
Special Requests: ADEQUATE
Special Requests: ADEQUATE

## 2019-03-23 ENCOUNTER — Ambulatory Visit (INDEPENDENT_AMBULATORY_CARE_PROVIDER_SITE_OTHER): Admitting: Family

## 2019-03-23 ENCOUNTER — Other Ambulatory Visit: Payer: Self-pay

## 2019-03-23 ENCOUNTER — Encounter: Payer: Self-pay | Admitting: Family

## 2019-03-23 DIAGNOSIS — R110 Nausea: Secondary | ICD-10-CM

## 2019-03-23 DIAGNOSIS — U071 COVID-19: Secondary | ICD-10-CM | POA: Diagnosis not present

## 2019-03-23 MED ORDER — ONDANSETRON HCL 4 MG PO TABS: 4.0000 mg | ORAL_TABLET | Freq: Three times a day (TID) | ORAL | 0 refills | Status: DC | PRN

## 2019-03-23 NOTE — Progress Notes (Signed)
Virtual Visit via Video Note  I connected with Charles Nash. on 03/23/19 at  8:40 AM EST by a video enabled telemedicine application and verified that I am speaking with the correct person using two identifiers.  Location: Patient: home Provider: home   I discussed the limitations of evaluation and management by telemedicine and the availability of in person appointments. The patient expressed understanding and agreed to proceed.  History of Present Illness:  Patient is a 53 yr old male who presents today for follow up. He was seen in the ED on 03/17/19 with cough. He was diagnosed with community acquired pneumonia and sent home.  He was swabbed for COVID-19 at that visit and ultimately tested positive.  He returned to the ED with some chest pain and CT was performed with the following results:   IMPRESSION: 1. No CT evidence of central pulmonary artery embolus. 2. Findings most consistent with multifocal pneumonia. Clinical correlation and follow-up recommended. 3. Trace right pleural effusion.   Troponin was negative at that time.    He reports that he is feeling better.  Notes mild nausea.  Does have fatigue. Denies sore throat.  + stuffy nose.  Past Medical History:  Diagnosis Date  . ADHD (attention deficit hyperactivity disorder)   . Allergy   . Arthritis   . Bilateral varicoceles   . Depression   . Fatty liver 08/06/2011  . Genital herpes 10/25/2014  . GERD (gastroesophageal reflux disease)   . Hyperlipidemia   . Hypertension   . OSA (obstructive sleep apnea)   . Personal history of colonic polyps   . Plantar fasciitis   . PTSD (post-traumatic stress disorder)      Social History   Socioeconomic History  . Marital status: Divorced    Spouse name: Not on file  . Number of children: 2  . Years of education: Not on file  . Highest education level: Not on file  Occupational History  . Occupation: Astronomer: [employer]  Social Needs  .  Financial resource strain: Not on file  . Food insecurity    Worry: Not on file    Inability: Not on file  . Transportation needs    Medical: Not on file    Non-medical: Not on file  Tobacco Use  . Smoking status: Never Smoker  . Smokeless tobacco: Never Used  Substance and Sexual Activity  . Alcohol use: No    Alcohol/week: 0.0 standard drinks  . Drug use: No  . Sexual activity: Not on file  Lifestyle  . Physical activity    Days per week: Not on file    Minutes per session: Not on file  . Stress: Not on file  Relationships  . Social Herbalist on phone: Not on file    Gets together: Not on file    Attends religious service: Not on file    Active member of club or organization: Not on file    Attends meetings of clubs or organizations: Not on file    Relationship status: Not on file  . Intimate partner violence    Fear of current or ex partner: Not on file    Emotionally abused: Not on file    Physically abused: Not on file    Forced sexual activity: Not on file  Other Topics Concern  . Not on file  Social History Narrative   Regular exercise:  No   Caffeine use:  2--32oz  cups daily       Past Surgical History:  Procedure Laterality Date  . ANKLE SURGERY    . DOPPLER ECHOCARDIOGRAPHY  2010  . FOOT SURGERY    . KNEE SURGERY  08/04/08   Right knee-- medial meniscus repair, attenuation anterior cruciate [name] MD [facility])   . NM MYOVIEW LTD  2010  . PLANTAR FASCIA RELEASE    . SHOULDER SURGERY    . SLEEVE GASTROPLASTY  10/29/2016  . VARICOCELE EXCISION      Family History  Problem Relation Age of Onset  . Diabetes Mother   . Hypertension Mother   . Arthritis Mother   . Cancer Mother        uterine and breast cancer, sarcoma of the brain  . Hyperlipidemia Father   . Arthritis Father   . Cancer Father        thyroid, prostate cancer  . Other Father        mass in stomach  . Hyperlipidemia Maternal Grandmother   .  Hypertension Maternal Grandmother   . Hyperlipidemia Maternal Grandfather   . Hypertension Maternal Grandfather   . Hyperlipidemia Paternal Grandmother   . Hypertension Paternal Grandmother   . Hyperlipidemia Paternal Grandfather   . Hypertension Paternal Grandfather   . Heart attack Paternal Grandfather   . Sudden death Neg Hx   . Colon cancer Neg Hx   . Esophageal cancer Neg Hx   . Stomach cancer Neg Hx   . Rectal cancer Neg Hx     Allergies  Allergen Reactions  . Oxycodone-Acetaminophen Hives and Itching  . Zoloft [Sertraline Hcl]     ?weight gain. "made me feel weird"    Current Outpatient Medications on File Prior to Visit  Medication Sig Dispense Refill  . acetaminophen (TYLENOL) 500 MG tablet Take 1,000 mg by mouth every 6 (six) hours as needed.    Marland Kitchen albuterol (VENTOLIN HFA) 108 (90 Base) MCG/ACT inhaler Inhale 2 puffs into the lungs every 4 (four) hours as needed for wheezing or shortness of breath. 18 g 1  . Albuterol Sulfate 108 (90 Base) MCG/ACT AEPB Inhale 1-2 puffs into the lungs every 6 (six) hours as needed (wheezing, sob). 1 each 2  . amoxicillin-clavulanate (AUGMENTIN) 875-125 MG tablet Take 1 tablet by mouth every 12 (twelve) hours. 20 tablet 0  . atorvastatin (LIPITOR) 10 MG tablet Take 1 tablet (10 mg total) by mouth daily. 90 tablet 1  . buPROPion (WELLBUTRIN XL) 300 MG 24 hr tablet Take 1 tablet (300 mg total) by mouth daily. (Patient taking differently: Take 150 mg by mouth daily. )    . CALCIUM-MAGNESIUM-VITAMIN D PO Take 1 tablet by mouth 3 (three) times daily.    . cetirizine (ZYRTEC) 10 MG tablet Take 1 tablet (10 mg total) by mouth 2 (two) times daily. 60 tablet 5  . Cyanocobalamin (VITAMIN B12 PO) Take by mouth.    . doxycycline (VIBRAMYCIN) 100 MG capsule Take 1 capsule (100 mg total) by mouth 2 (two) times daily. 20 capsule 0  . famotidine (PEPCID) 20 MG tablet Take 1 tablet (20 mg total) by mouth 2 (two) times daily. 60 tablet 5  . fexofenadine  (ALLEGRA) 180 MG tablet Take 1 tablet (180 mg total) by mouth daily as needed for allergies or rhinitis. 30 tablet 2  . fluticasone (FLONASE) 50 MCG/ACT nasal spray Place 2 sprays into both nostrils daily as needed for allergies or rhinitis. 16 g 5  . FOLIC ACID PO Take  by mouth.    . hydrALAZINE (APRESOLINE) 50 MG tablet TAKE 1 TABLET THREE TIMES A DAY 270 tablet 1  . levocetirizine (XYZAL) 5 MG tablet One or two tablets in the morning and one or two tablets in the evening as needed.1 120 tablet 5  . Liraglutide -Weight Management (SAXENDA) 18 MG/3ML SOPN Inject into the skin.    Marland Kitchen losartan (COZAAR) 50 MG tablet Take 1 tablet (50 mg total) by mouth daily. 30 tablet 5  . metoprolol succinate (TOPROL XL) 100 MG 24 hr tablet Take 1 tablet (100 mg total) by mouth 2 (two) times daily. Take with or immediately following a meal. 90 tablet 1  . Multiple Vitamin (MULTI-VITAMINS) TABS Take 1 tablet by mouth daily.    Marland Kitchen omeprazole (PRILOSEC) 20 MG capsule Take 1 capsule (20 mg total) by mouth 2 (two) times daily before a meal. 180 capsule 1  . polyethylene glycol (MIRALAX / GLYCOLAX) packet     . QUEtiapine (SEROQUEL) 25 MG tablet Take 1 tablet (25 mg total) by mouth at bedtime. 90 tablet 0  . sildenafil (VIAGRA) 100 MG tablet TAKE 1/2 TO 1 TABLET BY MOUTH DAILY AS NEEDED 15 tablet 1  . triamcinolone ointment (KENALOG) 0.1 % Apply to red itchy areas only below the face 80 g 3  . valACYclovir (VALTREX) 1000 MG tablet Take 1 tablet (1,000 mg total) by mouth daily. 30 tablet 5  . VYVANSE 20 MG capsule     . [DISCONTINUED] amLODipine (NORVASC) 5 MG tablet Take 1 tablet (5 mg total) by mouth daily. 30 tablet 3  . [DISCONTINUED] potassium chloride (K-DUR) 10 MEQ tablet Take 1 tablet (10 mEq total) by mouth daily. 30 tablet 1   No current facility-administered medications on file prior to visit.     There were no vitals taken for this visit.  Observations/Objective:   Gen: Awake, alert, no acute  distress Resp: Breathing is even and non-labored Psych: calm/pleasant demeanor Neuro: Alert and Oriented x 3, + facial symmetry, speech is clear.   Assessment and Plan:  COVID-19 infection with multifocal pneumonia.  Clinically improving. Advised pt to continue his antibiotics. In terms of quarantine he is advised as follows:  Patient was advised to quarantine as follows following positive COVID-19 result:  At least 10 days have passed since symptom onset and At least 24 hours have passed since resolution of fever without the use of fever-reducing medications and Other symptoms have improved.  Nausea- will rx with prn zofran.    Follow Up Instructions:    I discussed the assessment and treatment plan with the patient. The patient was provided an opportunity to ask questions and all were answered. The patient agreed with the plan and demonstrated an understanding of the instructions.   The patient was advised to call back or seek an in-person evaluation if the symptoms worsen or if the condition fails to improve as anticipated.  Nance Pear, NP

## 2019-03-25 ENCOUNTER — Encounter: Payer: Self-pay | Admitting: Family

## 2019-04-05 ENCOUNTER — Encounter: Payer: Self-pay | Admitting: Family

## 2019-04-18 ENCOUNTER — Encounter: Payer: Self-pay | Admitting: Family

## 2019-04-19 ENCOUNTER — Ambulatory Visit (INDEPENDENT_AMBULATORY_CARE_PROVIDER_SITE_OTHER): Admitting: Family

## 2019-04-19 ENCOUNTER — Encounter: Payer: Self-pay | Admitting: Family

## 2019-04-19 ENCOUNTER — Other Ambulatory Visit: Payer: Self-pay

## 2019-04-19 VITALS — Ht 73.0 in | Wt 275.0 lb

## 2019-04-19 DIAGNOSIS — M545 Low back pain, unspecified: Secondary | ICD-10-CM

## 2019-04-19 NOTE — Progress Notes (Signed)
Virtual Visit via Video Note  I connected with Charles Nash. on 04/19/19 at  2:20 PM EST by a video enabled telemedicine application and verified that I am speaking with the correct person using two identifiers.  Location: Patient: home Provider: home   I discussed the limitations of evaluation and management by telemedicine and the availability of in person appointments. The patient expressed understanding and agreed to proceed.  History of Present Illness:  Patient is a 54 yr old male who presents today with chief complaint of left sided low back pain.  Reports pain is worse with bending from left to right.  Also hurts if he turns over to his left side.  Denies LE weakness, bowel/bladder incontinence.  Requests a letter stating he is a health risk of complications from [id number] for the unemployment office.   He has worked as a Administrator previously.  Past Medical History:  Diagnosis Date  . ADHD (attention deficit hyperactivity disorder)   . Allergy   . Arthritis   . Bilateral varicoceles   . Depression   . Fatty liver 08/06/2011  . Genital herpes 10/25/2014  . GERD (gastroesophageal reflux disease)   . Hyperlipidemia   . Hypertension   . OSA (obstructive sleep apnea)   . Personal history of colonic polyps   . Plantar fasciitis   . PTSD (post-traumatic stress disorder)      Social History   Socioeconomic History  . Marital status: Divorced    Spouse name: Not on file  . Number of children: 2  . Years of education: Not on file  . Highest education level: Not on file  Occupational History  . Occupation: Astronomer: [employer]  Tobacco Use  . Smoking status: Never Smoker  . Smokeless tobacco: Never Used  Substance and Sexual Activity  . Alcohol use: No    Alcohol/week: 0.0 standard drinks  . Drug use: No  . Sexual activity: Not on file  Other Topics Concern  . Not on file  Social History Narrative   Regular exercise:  No   Caffeine  use:  2--32oz cups daily      Social Determinants of Health   Financial Resource Strain:   . Difficulty of Paying Living Expenses: Not on file  Food Insecurity:   . Worried About Charity fundraiser in the Last Year: Not on file  . Ran Out of Food in the Last Year: Not on file  Transportation Needs:   . Lack of Transportation (Medical): Not on file  . Lack of Transportation (Non-Medical): Not on file  Physical Activity:   . Days of Exercise per Week: Not on file  . Minutes of Exercise per Session: Not on file  Stress:   . Feeling of Stress : Not on file  Social Connections:   . Frequency of Communication with Friends and Family: Not on file  . Frequency of Social Gatherings with Friends and Family: Not on file  . Attends Religious Services: Not on file  . Active Member of Clubs or Organizations: Not on file  . Attends Archivist Meetings: Not on file  . Marital Status: Not on file  Intimate Partner Violence:   . Fear of Current or Ex-Partner: Not on file  . Emotionally Abused: Not on file  . Physically Abused: Not on file  . Sexually Abused: Not on file    Past Surgical History:  Procedure Laterality Date  . ANKLE SURGERY    .  DOPPLER ECHOCARDIOGRAPHY  2010  . FOOT SURGERY    . KNEE SURGERY  08/04/08   Right knee-- medial meniscus repair, attenuation anterior cruciate [name] MD [facility])   . NM MYOVIEW LTD  2010  . PLANTAR FASCIA RELEASE    . SHOULDER SURGERY    . SLEEVE GASTROPLASTY  10/29/2016  . VARICOCELE EXCISION      Family History  Problem Relation Age of Onset  . Diabetes Mother   . Hypertension Mother   . Arthritis Mother   . Cancer Mother        uterine and breast cancer, sarcoma of the brain  . Hyperlipidemia Father   . Arthritis Father   . Cancer Father        thyroid, prostate cancer  . Other Father        mass in stomach  . Hyperlipidemia Maternal Grandmother   . Hypertension Maternal Grandmother   . Hyperlipidemia  Maternal Grandfather   . Hypertension Maternal Grandfather   . Hyperlipidemia Paternal Grandmother   . Hypertension Paternal Grandmother   . Hyperlipidemia Paternal Grandfather   . Hypertension Paternal Grandfather   . Heart attack Paternal Grandfather   . Sudden death Neg Hx   . Colon cancer Neg Hx   . Esophageal cancer Neg Hx   . Stomach cancer Neg Hx   . Rectal cancer Neg Hx     Allergies  Allergen Reactions  . Oxycodone-Acetaminophen Hives and Itching  . Zoloft [Sertraline Hcl]     ?weight gain. "made me feel weird"    Current Outpatient Medications on File Prior to Visit  Medication Sig Dispense Refill  . acetaminophen (TYLENOL) 500 MG tablet Take 1,000 mg by mouth every 6 (six) hours as needed.    Marland Kitchen albuterol (VENTOLIN HFA) 108 (90 Base) MCG/ACT inhaler Inhale 2 puffs into the lungs every 4 (four) hours as needed for wheezing or shortness of breath. 18 g 1  . Albuterol Sulfate 108 (90 Base) MCG/ACT AEPB Inhale 1-2 puffs into the lungs every 6 (six) hours as needed (wheezing, sob). 1 each 2  . amoxicillin-clavulanate (AUGMENTIN) 875-125 MG tablet Take 1 tablet by mouth every 12 (twelve) hours. 20 tablet 0  . atorvastatin (LIPITOR) 10 MG tablet Take 1 tablet (10 mg total) by mouth daily. 90 tablet 1  . buPROPion (WELLBUTRIN XL) 300 MG 24 hr tablet Take 1 tablet (300 mg total) by mouth daily. (Patient taking differently: Take 150 mg by mouth daily. )    . CALCIUM-MAGNESIUM-VITAMIN D PO Take 1 tablet by mouth 3 (three) times daily.    . cetirizine (ZYRTEC) 10 MG tablet Take 1 tablet (10 mg total) by mouth 2 (two) times daily. 60 tablet 5  . Cyanocobalamin (VITAMIN B12 PO) Take by mouth.    . doxycycline (VIBRAMYCIN) 100 MG capsule Take 1 capsule (100 mg total) by mouth 2 (two) times daily. 20 capsule 0  . famotidine (PEPCID) 20 MG tablet Take 1 tablet (20 mg total) by mouth 2 (two) times daily. 60 tablet 5  . fexofenadine (ALLEGRA) 180 MG tablet Take 1 tablet (180 mg total) by  mouth daily as needed for allergies or rhinitis. 30 tablet 2  . fluticasone (FLONASE) 50 MCG/ACT nasal spray Place 2 sprays into both nostrils daily as needed for allergies or rhinitis. 16 g 5  . FOLIC ACID PO Take by mouth.    . hydrALAZINE (APRESOLINE) 50 MG tablet TAKE 1 TABLET THREE TIMES A DAY 270 tablet 1  .  levocetirizine (XYZAL) 5 MG tablet One or two tablets in the morning and one or two tablets in the evening as needed.1 120 tablet 5  . Liraglutide -Weight Management (SAXENDA) 18 MG/3ML SOPN Inject into the skin.    Marland Kitchen losartan (COZAAR) 50 MG tablet Take 1 tablet (50 mg total) by mouth daily. 30 tablet 5  . metoprolol succinate (TOPROL XL) 100 MG 24 hr tablet Take 1 tablet (100 mg total) by mouth 2 (two) times daily. Take with or immediately following a meal. 90 tablet 1  . Multiple Vitamin (MULTI-VITAMINS) TABS Take 1 tablet by mouth daily.    Marland Kitchen omeprazole (PRILOSEC) 20 MG capsule Take 1 capsule (20 mg total) by mouth 2 (two) times daily before a meal. 180 capsule 1  . ondansetron (ZOFRAN) 4 MG tablet Take 1 tablet (4 mg total) by mouth every 8 (eight) hours as needed for nausea or vomiting. 20 tablet 0  . polyethylene glycol (MIRALAX / GLYCOLAX) packet     . QUEtiapine (SEROQUEL) 25 MG tablet Take 1 tablet (25 mg total) by mouth at bedtime. 90 tablet 0  . sildenafil (VIAGRA) 100 MG tablet TAKE 1/2 TO 1 TABLET BY MOUTH DAILY AS NEEDED 15 tablet 1  . triamcinolone ointment (KENALOG) 0.1 % Apply to red itchy areas only below the face 80 g 3  . valACYclovir (VALTREX) 1000 MG tablet Take 1 tablet (1,000 mg total) by mouth daily. 30 tablet 5  . VYVANSE 20 MG capsule     . [DISCONTINUED] amLODipine (NORVASC) 5 MG tablet Take 1 tablet (5 mg total) by mouth daily. 30 tablet 3  . [DISCONTINUED] potassium chloride (K-DUR) 10 MEQ tablet Take 1 tablet (10 mEq total) by mouth daily. 30 tablet 1   No current facility-administered medications on file prior to visit.    Ht 6\' 1"  (1.854 m)   Wt 275  lb (124.7 kg)   BMI 36.28 kg/m      Observations/Objective:   Gen: Awake, alert, no acute distress Resp: Breathing is even and non-labored Psych: calm/pleasant demeanor Neuro: Alert and Oriented x 3, + facial symmetry, speech is clear.   Assessment and Plan:  Back pain-  Sounds like musculoskeletal pain. Will try to avoid nsaids due to bariatric status.  Continue prn tylenol. Pt is advised to follow up for in person visit if symptoms worsen or fail to improve.  I did provide a note for him to provide to the unemployment office.   Follow Up Instructions:    I discussed the assessment and treatment plan with the patient. The patient was provided an opportunity to ask questions and all were answered. The patient agreed with the plan and demonstrated an understanding of the instructions.   The patient was advised to call back or seek an in-person evaluation if the symptoms worsen or if the condition fails to improve as anticipated.  Nance Pear, NP

## 2019-04-22 ENCOUNTER — Encounter: Attending: Physical Medicine & Rehabilitation | Admitting: Physical Medicine & Rehabilitation

## 2019-04-22 ENCOUNTER — Other Ambulatory Visit: Payer: Self-pay

## 2019-04-22 ENCOUNTER — Encounter: Payer: Self-pay | Admitting: Physical Medicine & Rehabilitation

## 2019-04-22 VITALS — BP 145/94 | HR 86 | Temp 97.9°F | Ht 73.0 in | Wt 300.0 lb

## 2019-04-22 DIAGNOSIS — M545 Low back pain: Secondary | ICD-10-CM | POA: Diagnosis not present

## 2019-04-22 DIAGNOSIS — M5416 Radiculopathy, lumbar region: Secondary | ICD-10-CM | POA: Insufficient documentation

## 2019-04-22 DIAGNOSIS — M47816 Spondylosis without myelopathy or radiculopathy, lumbar region: Secondary | ICD-10-CM | POA: Diagnosis not present

## 2019-04-22 DIAGNOSIS — G8929 Other chronic pain: Secondary | ICD-10-CM | POA: Diagnosis not present

## 2019-04-22 MED ORDER — TRAMADOL HCL 50 MG PO TABS: 50.0000 mg | ORAL_TABLET | Freq: Four times a day (QID) | ORAL | 0 refills | Status: AC | PRN

## 2019-04-22 NOTE — Patient Instructions (Signed)
Will repeat left sided lumbar medial branch blocks

## 2019-04-22 NOTE — Progress Notes (Signed)
Subjective:    Patient ID: Charles Courier., male    DOB: 17-Dec-1965, 54 y.o.   MRN: [id number]  HPI  54 year old male with chief complaint of left-sided low back pain  Has been followed in this clinic over the last 4 years.  He has history of chronic low back pain.  Was seen at [facility] neurology by Dr. Wilford Grist who was performing L4-5 epidural steroid injections on the patient. His past medical history includes tendon Achilles surgery Patient also has anterior ankle pain with ambulation. Also has history of chronic knee pain with history of patellofemoral disorder RIght L5-S1 ESI helpful with RLE pain this was performed in September 2020  Previously the patient has responded with at least 50% pain relief with bilateral L3-L4-L5 medial branch blocks The patient had  50% relief with sacroiliac injection performed in 2017 The patient underwent bilateral L3-L4 medial branch and L5 dorsal ramus radiofrequency neurotomies in 2017 These procedures lasted greater than 6 months. The right side was repeated 10/10/2016. He then underwent laparoscopic sleeve placement at [facility] and experienced a rapid weight loss Goal of losing additional 60lb Patient has had subsequent sacroiliac injections but no repeat radiofrequency neurotomy of the lumbar spine. Pain Inventory Average Pain 3 Pain Right Now 3 My pain is sharp and aching  In the last 24 hours, has pain interfered with the following? General activity 0 Relation with others 0 Enjoyment of life 0 What TIME of day is your pain at its worst? daytime Sleep (in general) Fair  Pain is worse with: walking Pain improves with: na Relief from Meds: na  Mobility walk without assistance  Function disabled: date disabled .  Neuro/Psych spasms depression anxiety  Prior Studies Any changes since last visit?  no  Physicians involved in your care Any changes since last visit?  no   Family History  Problem Relation Age of  Onset  . Diabetes Mother   . Hypertension Mother   . Arthritis Mother   . Cancer Mother        uterine and breast cancer, sarcoma of the brain  . Hyperlipidemia Father   . Arthritis Father   . Cancer Father        thyroid, prostate cancer  . Other Father        mass in stomach  . Hyperlipidemia Maternal Grandmother   . Hypertension Maternal Grandmother   . Hyperlipidemia Maternal Grandfather   . Hypertension Maternal Grandfather   . Hyperlipidemia Paternal Grandmother   . Hypertension Paternal Grandmother   . Hyperlipidemia Paternal Grandfather   . Hypertension Paternal Grandfather   . Heart attack Paternal Grandfather   . Sudden death Neg Hx   . Colon cancer Neg Hx   . Esophageal cancer Neg Hx   . Stomach cancer Neg Hx   . Rectal cancer Neg Hx    Social History   Socioeconomic History  . Marital status: Divorced    Spouse name: Not on file  . Number of children: 2  . Years of education: Not on file  . Highest education level: Not on file  Occupational History  . Occupation: Astronomer: [employer]  Tobacco Use  . Smoking status: Never Smoker  . Smokeless tobacco: Never Used  Substance and Sexual Activity  . Alcohol use: No    Alcohol/week: 0.0 standard drinks  . Drug use: No  . Sexual activity: Not on file  Other Topics Concern  . Not on file  Social History Narrative   Regular exercise:  No   Caffeine use:  2--32oz cups daily      Social Determinants of Health   Financial Resource Strain:   . Difficulty of Paying Living Expenses: Not on file  Food Insecurity:   . Worried About Charity fundraiser in the Last Year: Not on file  . Ran Out of Food in the Last Year: Not on file  Transportation Needs:   . Lack of Transportation (Medical): Not on file  . Lack of Transportation (Non-Medical): Not on file  Physical Activity:   . Days of Exercise per Week: Not on file  . Minutes of Exercise per Session: Not on file  Stress:   .  Feeling of Stress : Not on file  Social Connections:   . Frequency of Communication with Friends and Family: Not on file  . Frequency of Social Gatherings with Friends and Family: Not on file  . Attends Religious Services: Not on file  . Active Member of Clubs or Organizations: Not on file  . Attends Archivist Meetings: Not on file  . Marital Status: Not on file   Past Surgical History:  Procedure Laterality Date  . ANKLE SURGERY    . DOPPLER ECHOCARDIOGRAPHY  2010  . FOOT SURGERY    . KNEE SURGERY  08/04/08   Right knee-- medial meniscus repair, attenuation anterior cruciate [name] MD [facility])   . NM MYOVIEW LTD  2010  . PLANTAR FASCIA RELEASE    . SHOULDER SURGERY    . SLEEVE GASTROPLASTY  10/29/2016  . VARICOCELE EXCISION     Past Medical History:  Diagnosis Date  . ADHD (attention deficit hyperactivity disorder)   . Allergy   . Arthritis   . Bilateral varicoceles   . Depression   . Fatty liver 08/06/2011  . Genital herpes 10/25/2014  . GERD (gastroesophageal reflux disease)   . Hyperlipidemia   . Hypertension   . OSA (obstructive sleep apnea)   . Personal history of colonic polyps   . Plantar fasciitis   . PTSD (post-traumatic stress disorder)    BP (!) 145/94   Pulse 86   Temp 97.9 F (36.6 C)   Ht 6\' 1"  (1.854 m)   Wt 300 lb (136.1 kg)   SpO2 97%   BMI 39.58 kg/m   Opioid Risk Score:   Fall Risk Score:  `1  Depression screen PHQ 2/9  Depression screen [facility] 2/9 12/29/2017 09/18/2017 07/23/2016 01/01/2016 07/23/2015 06/25/2015 06/04/2015  Decreased Interest 0 0 2 0 0 1 1  Down, Depressed, Hopeless 0 0 0 0 0 1 1  PHQ - 2 Score 0 0 2 0 0 2 2  Altered sleeping - - 3 - - - -  Tired, decreased energy - - 3 - - - -  Change in appetite - - 3 - - - -  Feeling bad or failure about yourself  - - 0 - - - -  Trouble concentrating - - 3 - - - -  Moving slowly or fidgety/restless - - 0 - - - -  Suicidal thoughts - - 0 - - - -  PHQ-9 Score - -  14 - - - -  Difficult doing work/chores - - - - - - -  Some recent data might be hidden     Review of Systems  Constitutional: Negative.   HENT: Negative.   Eyes: Negative.   Respiratory: Positive for apnea.   Cardiovascular:  Negative.   Gastrointestinal: Negative.   Endocrine: Negative.   Genitourinary: Negative.   Musculoskeletal: Positive for arthralgias, back pain and myalgias.  Skin: Negative.   Allergic/Immunologic: Negative.   Neurological: Negative.   Hematological: Negative.   Psychiatric/Behavioral: Positive for dysphoric mood. The patient is nervous/anxious.   All other systems reviewed and are negative.      Objective:   Physical Exam Vitals and nursing note reviewed.  Constitutional:      Appearance: Normal appearance.  HENT:     Head: Normocephalic and atraumatic.  Eyes:     Extraocular Movements: Extraocular movements intact.     Conjunctiva/sclera: Conjunctivae normal.     Pupils: Pupils are equal, round, and reactive to light.  Neurological:     General: No focal deficit present.     Mental Status: He is alert and oriented to person, place, and time.  Psychiatric:        Mood and Affect: Mood normal.    Mild tenderness palpation in the lumbar paraspinals on the left side around L4 region.  He also has pain more laterally to the tip of the 12th rib approximately. He has negative straight leg raising normal hip range of motion.  No pain with knee range of motion.  Negative straight leg raising bilaterally Motor strength is 5/5 bilateral hip flexor knee extensor ankle dorsiflexor plantar flexor hip adductor and hip abductor       Assessment & Plan:  #1.  Left-sided low back pain.  He has had previous radiofrequency neurotomy of the L3-L4 medial branch and L5 dorsal ramus on the left side last performed in 2017 and a repeat procedure was performed on the right side at the same levels in 2018. We discussed that that area of pain may be partially  muscular as well as partially joint related.  Medial branch block repeats would be helpful to sort this out.  If left-sided L3-L4 medial branch and L5 dorsal ramus injections produce a temporary pain relief of at least 50% repeat radiofrequency neurotomy would be recommended. In the interval time we will start some tramadol 50 mg once or twice a day

## 2019-04-26 ENCOUNTER — Encounter: Payer: Self-pay | Admitting: Family

## 2019-04-26 DIAGNOSIS — R109 Unspecified abdominal pain: Secondary | ICD-10-CM

## 2019-04-26 DIAGNOSIS — Q799 Congenital malformation of musculoskeletal system, unspecified: Secondary | ICD-10-CM

## 2019-04-26 DIAGNOSIS — M549 Dorsalgia, unspecified: Secondary | ICD-10-CM

## 2019-04-27 ENCOUNTER — Other Ambulatory Visit: Payer: Self-pay

## 2019-04-27 ENCOUNTER — Ambulatory Visit (HOSPITAL_BASED_OUTPATIENT_CLINIC_OR_DEPARTMENT_OTHER)
Admission: RE | Admit: 2019-04-27 | Discharge: 2019-04-27 | Disposition: A | Discharge status: Home / Self Care | Source: Ambulatory Visit | Attending: Family | Admitting: Family

## 2019-04-27 DIAGNOSIS — M549 Dorsalgia, unspecified: Secondary | ICD-10-CM | POA: Diagnosis present

## 2019-04-28 NOTE — Telephone Encounter (Signed)
Reviewed films with radiologist on call. He recommended CT abd/pelvis (non-contrast).

## 2019-04-28 NOTE — Addendum Note (Signed)
Addended by: Debbrah Alar on: 04/28/2019 12:38 PM   Modules accepted: Orders

## 2019-04-28 NOTE — Addendum Note (Signed)
Addended by: Debbrah Alar on: 04/28/2019 01:08 PM   Modules accepted: Orders

## 2019-04-29 ENCOUNTER — Other Ambulatory Visit: Payer: Self-pay

## 2019-04-29 ENCOUNTER — Ambulatory Visit (HOSPITAL_BASED_OUTPATIENT_CLINIC_OR_DEPARTMENT_OTHER)
Admission: RE | Admit: 2019-04-29 | Discharge: 2019-04-29 | Disposition: A | Discharge status: Home / Self Care | Source: Ambulatory Visit | Attending: Family | Admitting: Family

## 2019-04-29 DIAGNOSIS — R109 Unspecified abdominal pain: Secondary | ICD-10-CM | POA: Diagnosis not present

## 2019-04-29 NOTE — Telephone Encounter (Signed)
Ct was done today

## 2019-05-02 ENCOUNTER — Telehealth: Payer: Self-pay | Admitting: Family

## 2019-05-02 NOTE — Telephone Encounter (Signed)
See hpi

## 2019-05-05 ENCOUNTER — Emergency Department (HOSPITAL_BASED_OUTPATIENT_CLINIC_OR_DEPARTMENT_OTHER)

## 2019-05-05 ENCOUNTER — Other Ambulatory Visit: Payer: Self-pay

## 2019-05-05 ENCOUNTER — Observation Stay (HOSPITAL_BASED_OUTPATIENT_CLINIC_OR_DEPARTMENT_OTHER)
Admission: EM | Admit: 2019-05-05 | Discharge: 2019-05-06 | Disposition: A | Discharge status: Home / Self Care | Attending: Surgery | Admitting: Surgery

## 2019-05-05 ENCOUNTER — Encounter (HOSPITAL_BASED_OUTPATIENT_CLINIC_OR_DEPARTMENT_OTHER): Payer: Self-pay

## 2019-05-05 DIAGNOSIS — Z8049 Family history of malignant neoplasm of other genital organs: Secondary | ICD-10-CM | POA: Insufficient documentation

## 2019-05-05 DIAGNOSIS — Z8349 Family history of other endocrine, nutritional and metabolic diseases: Secondary | ICD-10-CM | POA: Insufficient documentation

## 2019-05-05 DIAGNOSIS — Z20822 Contact with and (suspected) exposure to covid-19: Secondary | ICD-10-CM | POA: Insufficient documentation

## 2019-05-05 DIAGNOSIS — F329 Major depressive disorder, single episode, unspecified: Secondary | ICD-10-CM | POA: Insufficient documentation

## 2019-05-05 DIAGNOSIS — M47816 Spondylosis without myelopathy or radiculopathy, lumbar region: Secondary | ICD-10-CM | POA: Diagnosis not present

## 2019-05-05 DIAGNOSIS — I10 Essential (primary) hypertension: Secondary | ICD-10-CM | POA: Diagnosis not present

## 2019-05-05 DIAGNOSIS — Z903 Acquired absence of stomach [part of]: Secondary | ICD-10-CM

## 2019-05-05 DIAGNOSIS — K219 Gastro-esophageal reflux disease without esophagitis: Secondary | ICD-10-CM | POA: Diagnosis present

## 2019-05-05 DIAGNOSIS — K3589 Other acute appendicitis without perforation or gangrene: Secondary | ICD-10-CM | POA: Diagnosis not present

## 2019-05-05 DIAGNOSIS — Z86010 Personal history of colonic polyps: Secondary | ICD-10-CM | POA: Diagnosis not present

## 2019-05-05 DIAGNOSIS — E1142 Type 2 diabetes mellitus with diabetic polyneuropathy: Secondary | ICD-10-CM | POA: Diagnosis not present

## 2019-05-05 DIAGNOSIS — Z9989 Dependence on other enabling machines and devices: Secondary | ICD-10-CM

## 2019-05-05 DIAGNOSIS — F411 Generalized anxiety disorder: Secondary | ICD-10-CM | POA: Diagnosis present

## 2019-05-05 DIAGNOSIS — M542 Cervicalgia: Secondary | ICD-10-CM | POA: Insufficient documentation

## 2019-05-05 DIAGNOSIS — Z8261 Family history of arthritis: Secondary | ICD-10-CM | POA: Insufficient documentation

## 2019-05-05 DIAGNOSIS — Z9884 Bariatric surgery status: Secondary | ICD-10-CM | POA: Insufficient documentation

## 2019-05-05 DIAGNOSIS — Z888 Allergy status to other drugs, medicaments and biological substances status: Secondary | ICD-10-CM | POA: Insufficient documentation

## 2019-05-05 DIAGNOSIS — H903 Sensorineural hearing loss, bilateral: Secondary | ICD-10-CM | POA: Insufficient documentation

## 2019-05-05 DIAGNOSIS — E669 Obesity, unspecified: Secondary | ICD-10-CM | POA: Diagnosis present

## 2019-05-05 DIAGNOSIS — K429 Umbilical hernia without obstruction or gangrene: Secondary | ICD-10-CM | POA: Insufficient documentation

## 2019-05-05 DIAGNOSIS — Z8042 Family history of malignant neoplasm of prostate: Secondary | ICD-10-CM | POA: Insufficient documentation

## 2019-05-05 DIAGNOSIS — Z803 Family history of malignant neoplasm of breast: Secondary | ICD-10-CM | POA: Insufficient documentation

## 2019-05-05 DIAGNOSIS — J4520 Mild intermittent asthma, uncomplicated: Secondary | ICD-10-CM | POA: Diagnosis not present

## 2019-05-05 DIAGNOSIS — Z8249 Family history of ischemic heart disease and other diseases of the circulatory system: Secondary | ICD-10-CM | POA: Diagnosis not present

## 2019-05-05 DIAGNOSIS — Z6838 Body mass index (BMI) 38.0-38.9, adult: Secondary | ICD-10-CM | POA: Diagnosis not present

## 2019-05-05 DIAGNOSIS — Z833 Family history of diabetes mellitus: Secondary | ICD-10-CM | POA: Insufficient documentation

## 2019-05-05 DIAGNOSIS — G4733 Obstructive sleep apnea (adult) (pediatric): Secondary | ICD-10-CM

## 2019-05-05 DIAGNOSIS — F4310 Post-traumatic stress disorder, unspecified: Secondary | ICD-10-CM | POA: Diagnosis not present

## 2019-05-05 DIAGNOSIS — K760 Fatty (change of) liver, not elsewhere classified: Secondary | ICD-10-CM | POA: Insufficient documentation

## 2019-05-05 DIAGNOSIS — K3580 Unspecified acute appendicitis: Secondary | ICD-10-CM | POA: Diagnosis present

## 2019-05-05 DIAGNOSIS — Z9889 Other specified postprocedural states: Secondary | ICD-10-CM

## 2019-05-05 DIAGNOSIS — Z79899 Other long term (current) drug therapy: Secondary | ICD-10-CM | POA: Insufficient documentation

## 2019-05-05 DIAGNOSIS — E785 Hyperlipidemia, unspecified: Secondary | ICD-10-CM | POA: Diagnosis not present

## 2019-05-05 DIAGNOSIS — E042 Nontoxic multinodular goiter: Secondary | ICD-10-CM | POA: Insufficient documentation

## 2019-05-05 DIAGNOSIS — R112 Nausea with vomiting, unspecified: Secondary | ICD-10-CM

## 2019-05-05 DIAGNOSIS — E119 Type 2 diabetes mellitus without complications: Secondary | ICD-10-CM

## 2019-05-05 DIAGNOSIS — Z885 Allergy status to narcotic agent status: Secondary | ICD-10-CM | POA: Insufficient documentation

## 2019-05-05 DIAGNOSIS — Z808 Family history of malignant neoplasm of other organs or systems: Secondary | ICD-10-CM | POA: Insufficient documentation

## 2019-05-05 LAB — COMPREHENSIVE METABOLIC PANEL
ALT: 28 U/L (ref 0–44)
AST: 28 U/L (ref 15–41)
Albumin: 4.9 g/dL (ref 3.5–5.0)
Alkaline Phosphatase: 86 U/L (ref 38–126)
Anion gap: 9 (ref 5–15)
BUN: 9 mg/dL (ref 6–20)
CO2: 27 mmol/L (ref 22–32)
Calcium: 9.6 mg/dL (ref 8.9–10.3)
Chloride: 102 mmol/L (ref 98–111)
Creatinine, Ser: 1.13 mg/dL (ref 0.61–1.24)
GFR calc Af Amer: >60 mL/min (ref >60)
GFR calc non Af Amer: >60 mL/min (ref >60)
Glucose, Bld: 93 mg/dL (ref 70–99)
Potassium: 3.8 mmol/L (ref 3.5–5.1)
Sodium: 138 mmol/L (ref 135–145)
Total Bilirubin: 1.3 mg/dL — ABNORMAL HIGH (ref 0.3–1.2)
Total Protein: 8.9 g/dL — ABNORMAL HIGH (ref 6.5–8.1)

## 2019-05-05 LAB — URINALYSIS, ROUTINE W REFLEX MICROSCOPIC
Bilirubin Urine: NEGATIVE
Glucose, UA: NEGATIVE mg/dL
Hgb urine dipstick: NEGATIVE
Ketones, ur: NEGATIVE mg/dL
Leukocytes,Ua: NEGATIVE
Nitrite: NEGATIVE
Protein, ur: NEGATIVE mg/dL
Specific Gravity, Urine: 1.02 (ref 1.005–1.030)
pH: 7 (ref 5.0–8.0)

## 2019-05-05 LAB — CBC
HCT: 46.8 % (ref 39.0–52.0)
Hemoglobin: 15.3 g/dL (ref 13.0–17.0)
MCH: 30.3 pg (ref 26.0–34.0)
MCHC: 32.7 g/dL (ref 30.0–36.0)
MCV: 92.7 fL (ref 80.0–100.0)
Platelets: 352 10*3/uL (ref 150–400)
RBC: 5.05 MIL/uL (ref 4.22–5.81)
RDW: 13.7 % (ref 11.5–15.5)
WBC: 5.4 10*3/uL (ref 4.0–10.5)
nRBC: 0 % (ref 0.0–0.2)

## 2019-05-05 LAB — LIPASE, BLOOD: Lipase: 41 U/L (ref 11–51)

## 2019-05-05 MED ORDER — SODIUM CHLORIDE 0.9 % IV BOLUS
1000.0000 mL | Freq: Once | INTRAVENOUS | Status: AC
  Administered 2019-05-05: 1000 mL via INTRAVENOUS

## 2019-05-05 MED ORDER — IOHEXOL 300 MG/ML  SOLN
100.0000 mL | Freq: Once | INTRAMUSCULAR | Status: AC
  Administered 2019-05-06: 100 mL via INTRAVENOUS

## 2019-05-05 NOTE — ED Triage Notes (Signed)
Pt c/o RLQ with nausea. Denies vomiting or diarrhea.

## 2019-05-05 NOTE — ED Provider Notes (Signed)
[place] EMERGENCY DEPARTMENT Provider Note   CSN: [id number] Arrival date & time: 05/05/19  2250     History Chief Complaint  Patient presents with  . Abdominal Pain    Charles Nash. is a 54 y.o. male.  Patient presents to the emergency department for evaluation of abdominal pain.  Pain began yesterday and has worsened today.  Pain is on the right side.  He has had nausea but no vomiting.  He has not had any fever, diarrhea, constipation.  Denies urinary symptoms.        Past Medical History:  Diagnosis Date  . ADHD (attention deficit hyperactivity disorder)   . Allergy   . Arthritis   . Bilateral varicoceles   . Depression   . Fatty liver 08/06/2011  . Genital herpes 10/25/2014  . GERD (gastroesophageal reflux disease)   . Hyperlipidemia   . Hypertension   . OSA (obstructive sleep apnea)   . Personal history of colonic polyps   . Plantar fasciitis   . PTSD (post-traumatic stress disorder)     Patient Active Problem List   Diagnosis Date Noted  . Recurrent urticaria 03/02/2019  . Mild intermittent asthma 03/02/2019  . Myofascial pain dysfunction syndrome 01/02/2017  . Insect bites 11/10/2016  . Diabetic polyneuropathy associated with type 2 diabetes mellitus ([place]) 08/25/2016  . Achilles tendinitis, left leg 08/25/2016  . Achilles tendon contracture, left 08/25/2016  . Xerosis of skin 04/22/2016  . Chronic urticaria 03/05/2016  . Sacroiliac joint dysfunction of both sides 01/01/2016  . Anxiety state 08/20/2015  . 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 Patellofemoral disorder 05/15/2015  . Current use of beta blocker 05/15/2015  . Genital herpes 10/25/2014  . Depression 10/05/2014  . Osteoarthritis of left knee 08/15/2014  . 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 Sciatica 04/22/2014  . Osteoarthritis of right knee 02/06/2014  . Allergy to mold 12/13/2013  . Weight gain, abnormal 11/08/2013  . PTSD  (post-traumatic stress disorder) 07/05/2013  . Multinodular goiter 01/19/2013  . GERD (gastroesophageal reflux disease) 08/09/2012  . Dermographia 12/29/2011  . Neck pain 11/12/2011  . Fatty liver 08/06/2011  . Cervical disc disease 07/28/2011  . Preventative health care 07/28/2011  . Erectile dysfunction 05/23/2011  . Elevated liver function tests 05/20/2011  . Type II diabetes mellitus, well controlled ([place]) 05/19/2011  . Essential hypertension 05/19/2011  . Hyperlipidemia 05/19/2011  . Morbid obesity ([employer]) 05/19/2011  . Other allergic rhinitis 05/19/2011  . Obstructive sleep apnea treated with continuous positive airway pressure (CPAP) 05/19/2011    Past Surgical History:  Procedure Laterality Date  . ANKLE SURGERY    . DOPPLER ECHOCARDIOGRAPHY  2010  . FOOT SURGERY    . KNEE SURGERY  08/04/08   Right knee-- medial meniscus repair, attenuation anterior cruciate [name] MD [facility])   . NM MYOVIEW LTD  2010  . PLANTAR FASCIA RELEASE    . SHOULDER SURGERY    . SLEEVE GASTROPLASTY  10/29/2016  . VARICOCELE EXCISION         Family History  Problem Relation Age of Onset  . Diabetes Mother   . Hypertension Mother   . Arthritis Mother   . Cancer Mother        uterine and breast cancer, sarcoma of the brain  . Hyperlipidemia Father   . Arthritis Father   . Cancer Father        thyroid, prostate cancer  . Other Father  mass in stomach  . Hyperlipidemia Maternal Grandmother   . Hypertension Maternal Grandmother   . Hyperlipidemia Maternal Grandfather   . Hypertension Maternal Grandfather   . Hyperlipidemia Paternal Grandmother   . Hypertension Paternal Grandmother   . Hyperlipidemia Paternal Grandfather   . Hypertension Paternal Grandfather   . Heart attack Paternal Grandfather   . Sudden death Neg Hx   . Colon cancer Neg Hx   . Esophageal cancer Neg Hx   . Stomach cancer Neg Hx   . Rectal cancer Neg Hx     Social History   Tobacco Use   . Smoking status: Never Smoker  . Smokeless tobacco: Never Used  Substance Use Topics  . Alcohol use: No    Alcohol/week: 0.0 standard drinks  . Drug use: No    Home Medications Prior to Admission medications   Medication Sig Start Date End Date Taking? Authorizing Provider  acetaminophen (TYLENOL) 500 MG tablet Take 1,000 mg by mouth every 6 (six) hours as needed.    [provider]  albuterol (VENTOLIN HFA) 108 (90 Base) MCG/ACT inhaler Inhale 2 puffs into the lungs every 4 (four) hours as needed for wheezing or shortness of breath. 03/02/19   Bobbitt, Sedalia Muta, MD  Albuterol Sulfate 108 (90 Base) MCG/ACT AEPB Inhale 1-2 puffs into the lungs every 6 (six) hours as needed (wheezing, sob). 06/30/18   Shelda Pal, DO  atorvastatin (LIPITOR) 10 MG tablet Take 1 tablet (10 mg total) by mouth daily. 11/25/18   Debbrah Alar, NP  buPROPion (WELLBUTRIN XL) 300 MG 24 hr tablet Take 1 tablet (300 mg total) by mouth daily. Patient taking differently: Take 150 mg by mouth daily.  12/26/16   Debbrah Alar, NP  CALCIUM-MAGNESIUM-VITAMIN D PO Take 1 tablet by mouth 3 (three) times daily.    [provider]  cetirizine (ZYRTEC) 10 MG tablet Take 1 tablet (10 mg total) by mouth 2 (two) times daily. 06/17/18   Valentina Shaggy, MD  Cyanocobalamin (VITAMIN B12 PO) Take by mouth.    [provider]  doxycycline (VIBRAMYCIN) 100 MG capsule Take 1 capsule (100 mg total) by mouth 2 (two) times daily. 03/17/19   Rancour, Annie Main, MD  famotidine (PEPCID) 20 MG tablet Take 1 tablet (20 mg total) by mouth 2 (two) times daily. 03/02/19   Bobbitt, Sedalia Muta, MD  fexofenadine (ALLEGRA) 180 MG tablet Take 1 tablet (180 mg total) by mouth daily as needed for allergies or rhinitis. 11/10/16   Bobbitt, Sedalia Muta, MD  fluticasone (FLONASE) 50 MCG/ACT nasal spray Place 2 sprays into both nostrils daily as needed for allergies or rhinitis. 04/29/18   Valentina Shaggy, MD  FOLIC ACID PO Take by mouth.    [provider]  hydrALAZINE (APRESOLINE) 50 MG tablet TAKE 1 TABLET THREE TIMES A DAY 08/24/17   Debbrah Alar, NP  levocetirizine (XYZAL) 5 MG tablet One or two tablets in the morning and one or two tablets in the evening as needed.1 03/02/19   Bobbitt, Sedalia Muta, MD  Liraglutide -Weight Management (SAXENDA) 18 MG/3ML SOPN Inject into the skin. 03/01/19   [provider]  losartan (COZAAR) 50 MG tablet Take 1 tablet (50 mg total) by mouth daily. 07/17/17   Debbrah Alar, NP  metoprolol succinate (TOPROL XL) 100 MG 24 hr tablet Take 1 tablet (100 mg total) by mouth 2 (two) times daily. Take with or immediately following a meal. 02/08/19   Debbrah Alar, NP  Multiple Vitamin (MULTI-VITAMINS)  TABS Take 1 tablet by mouth daily.    [provider]  omeprazole (PRILOSEC) 20 MG capsule Take 1 capsule (20 mg total) by mouth 2 (two) times daily before a meal. 11/25/18   Debbrah Alar, NP  ondansetron (ZOFRAN) 4 MG tablet Take 1 tablet (4 mg total) by mouth every 8 (eight) hours as needed for nausea or vomiting. 03/23/19   Debbrah Alar, NP  polyethylene glycol Merril Abbe / GLYCOLAX) packet  10/30/16   [provider]  QUEtiapine (SEROQUEL) 25 MG tablet Take 1 tablet (25 mg total) by mouth at bedtime. 08/06/17   Debbrah Alar, NP  sildenafil (VIAGRA) 100 MG tablet TAKE 1/2 TO 1 TABLET BY MOUTH DAILY AS NEEDED 03/13/17   Debbrah Alar, NP  traMADol (ULTRAM) 50 MG tablet Take 1 tablet (50 mg total) by mouth every 6 (six) hours as needed. 04/22/19   Kirsteins, Luanna Salk, MD  triamcinolone ointment (KENALOG) 0.1 % Apply to red itchy areas only below the face 03/02/19   Bobbitt, Sedalia Muta, MD  valACYclovir (VALTREX) 1000 MG tablet Take 1 tablet (1,000 mg total) by mouth daily. 03/08/19   Debbrah Alar, NP  VYVANSE 20 MG capsule  04/10/17   [provider]  amLODipine (NORVASC) 5 MG  tablet Take 1 tablet (5 mg total) by mouth daily. 07/17/17 06/11/18  Debbrah Alar, NP  potassium chloride (K-DUR) 10 MEQ tablet Take 1 tablet (10 mEq total) by mouth daily. 05/19/11 07/11/11  Debbrah Alar, NP    Allergies    Oxycodone-acetaminophen and Zoloft [sertraline hcl]  Review of Systems   Review of Systems  Gastrointestinal: Positive for abdominal pain and nausea.  All other systems reviewed and are negative.   Physical Exam Updated Vital Signs BP (!) 146/93 (BP Location: Right Arm)   Pulse 92   Temp 98.2 F (36.8 C) (Oral)   Resp 15   Ht 6\' 1"  (1.854 m)   Wt 133.8 kg   SpO2 99%   BMI 38.92 kg/m   Physical Exam Vitals and nursing note reviewed.  Constitutional:      General: He is not in acute distress.    Appearance: Normal appearance. He is well-developed.  HENT:     Head: Normocephalic and atraumatic.     Right Ear: Hearing normal.     Left Ear: Hearing normal.     Nose: Nose normal.  Eyes:     Conjunctiva/sclera: Conjunctivae normal.     Pupils: Pupils are equal, round, and reactive to light.  Cardiovascular:     Rate and Rhythm: Regular rhythm.     Heart sounds: S1 normal and S2 normal. No murmur. No friction rub. No gallop.   Pulmonary:     Effort: Pulmonary effort is normal. No respiratory distress.     Breath sounds: Normal breath sounds.  Chest:     Chest wall: No tenderness.  Abdominal:     General: Bowel sounds are normal.     Palpations: Abdomen is soft.     Tenderness: There is abdominal tenderness in the right upper quadrant and right lower quadrant. There is no guarding or rebound. Negative signs include Murphy's sign and McBurney's sign.     Hernia: No hernia is present.  Musculoskeletal:        General: Normal range of motion.     Cervical back: Normal range of motion and neck supple.  Skin:    General: Skin is warm and dry.     Findings: No rash.  Neurological:  Mental Status: He is alert and oriented to person, place,  and time.     GCS: GCS eye subscore is 4. GCS verbal subscore is 5. GCS motor subscore is 6.     Cranial Nerves: No cranial nerve deficit.     Sensory: No sensory deficit.     Coordination: Coordination normal.  Psychiatric:        Speech: Speech normal.        Behavior: Behavior normal.        Thought Content: Thought content normal.     ED Results / Procedures / Treatments   Labs (all labs ordered are listed, but only abnormal results are displayed) Labs Reviewed  COMPREHENSIVE METABOLIC PANEL - Abnormal; Notable for the following components:      Result Value   Total Protein 8.9 (*)    Total Bilirubin 1.3 (*)    All other components within normal limits  RESPIRATORY PANEL BY RT PCR (FLU A&B, COVID)  LIPASE, BLOOD  CBC  URINALYSIS, ROUTINE W REFLEX MICROSCOPIC    EKG None  Radiology CT ABDOMEN PELVIS W CONTRAST  Result Date: 05/06/2019 CLINICAL DATA:  Abdominal pain. Right lower quadrant pain with nausea. History of gastroplasty. EXAM: CT ABDOMEN AND PELVIS WITH CONTRAST TECHNIQUE: Multidetector CT imaging of the abdomen and pelvis was performed using the standard protocol following bolus administration of intravenous contrast. CONTRAST:  133mL OMNIPAQUE IOHEXOL 300 MG/ML  SOLN COMPARISON:  CT 1 week ago, 04/29/2019. FINDINGS: Lower chest: Lung bases are clear. Hepatobiliary: No focal liver abnormality is seen. No gallstones, gallbladder wall thickening, or biliary dilatation. Pancreas: No ductal dilatation or inflammation. Spleen: Normal in size without focal abnormality. Adrenals/Urinary Tract: Thickening of the left adrenal gland without dominant nodule. Normal right adrenal gland. No hydronephrosis or perinephric edema. Homogeneous renal enhancement with symmetric excretion on delayed phase imaging. Urinary bladder is nondistended. Stomach/Bowel: Prior gastric sleeve resection. Lack of enteric contrast limits detailed gastric evaluation. No small bowel obstruction or  inflammation. Mild thickening of the proximal appendix at 8 mm. Punctate high density in the distal appendix may represent an appendicular lith for enteric contrast from prior exam. This was seen on previous. Fat stranding adjacent to the appendiceal base and posterolateral cecum. There is no associated cecal wall thickening. The terminal ileum is normal. Fat stranding is new from exam 1 week ago. Small to moderate volume of colonic stool. Vascular/Lymphatic: Multiple small retroperitoneal nodes not enlarged by size criteria. Abdominal aorta is normal in caliber. Portal vein is patent. Few prominent bilateral iliac nodes, unchanged. Reproductive: Prominent prostate gland spans 5.2 cm. Other: No free air, free fluid, or intra-abdominal fluid collection. Tiny fat containing umbilical hernia. Musculoskeletal: There are no acute or suspicious osseous abnormalities. Stable degenerative change in the lower lumbar spine. IMPRESSION: Mild thickening of the proximal appendix at 8 mm. Right lower quadrant fat stranding adjacent to the base of the appendix and posterolateral cecum. Findings are suspicious for early acute appendicitis, however mild focal cecal colitis or epiploic appendagitis are also considered. Regardless, no perforation or abscess. Electronically Signed   By: Keith Rake M.D.   On: 05/06/2019 00:39    Procedures Procedures (including critical care time)  Medications Ordered in ED Medications  ondansetron (ZOFRAN) injection 4 mg (has no administration in time range)  morphine 4 MG/ML injection 4 mg (has no administration in time range)  cefTRIAXone (ROCEPHIN) 2 g in sodium chloride 0.9 % 100 mL IVPB (has no administration in time range)  And  metroNIDAZOLE (FLAGYL) IVPB 500 mg (has no administration in time range)  sodium chloride 0.9 % bolus 1,000 mL (1,000 mLs Intravenous New Bag/Given 05/05/19 2358)  iohexol (OMNIPAQUE) 300 MG/ML solution 100 mL (100 mLs Intravenous Contrast Given  05/06/19 0009)    ED Course  I have reviewed the triage vital signs and the nursing notes.  Pertinent labs & imaging results that were available during my care of the patient were reviewed by me and considered in my medical decision making (see chart for details).    MDM Rules/Calculators/A&P                      Patient presents to the emergency department for evaluation of abdominal pain.  Patient experiencing pain for 2 days.  Pain has progressively worsened, has had some nausea without vomiting.  He does not have a fever.  Vital signs are unremarkable.  Lab work was unremarkable, but exam did reveal moderate tenderness in the right middle and right lower quadrant.  CT scan performed does show evidence of early acute appendicitis without complications.  Discussed with Dr. Johney Maine, on-call for surgery.  Patient to be transferred to [facility] emergency department for further care.  Patient has been hydrated, provide analgesia and antibiotic coverage.  Final Clinical Impression(s) / ED Diagnoses Final diagnoses:  Acute appendicitis, unspecified acute appendicitis type    Rx / DC Orders ED Discharge Orders    None       Orpah Greek, MD 05/06/19 [phone]

## 2019-05-06 ENCOUNTER — Encounter (HOSPITAL_COMMUNITY)
Admission: EM | Disposition: A | Discharge status: Home / Self Care | Payer: Self-pay | Source: Home / Self Care | Attending: Emergency Medicine

## 2019-05-06 ENCOUNTER — Observation Stay (HOSPITAL_COMMUNITY): Admitting: Anesthesiology

## 2019-05-06 ENCOUNTER — Encounter (HOSPITAL_COMMUNITY): Payer: Self-pay

## 2019-05-06 DIAGNOSIS — E1142 Type 2 diabetes mellitus with diabetic polyneuropathy: Secondary | ICD-10-CM | POA: Diagnosis not present

## 2019-05-06 DIAGNOSIS — Z86010 Personal history of colonic polyps: Secondary | ICD-10-CM | POA: Diagnosis not present

## 2019-05-06 DIAGNOSIS — I10 Essential (primary) hypertension: Secondary | ICD-10-CM | POA: Diagnosis not present

## 2019-05-06 DIAGNOSIS — K3589 Other acute appendicitis without perforation or gangrene: Secondary | ICD-10-CM | POA: Diagnosis not present

## 2019-05-06 DIAGNOSIS — H903 Sensorineural hearing loss, bilateral: Secondary | ICD-10-CM | POA: Diagnosis not present

## 2019-05-06 DIAGNOSIS — Z9884 Bariatric surgery status: Secondary | ICD-10-CM | POA: Diagnosis not present

## 2019-05-06 DIAGNOSIS — K3580 Unspecified acute appendicitis: Secondary | ICD-10-CM | POA: Diagnosis present

## 2019-05-06 DIAGNOSIS — Z903 Acquired absence of stomach [part of]: Secondary | ICD-10-CM

## 2019-05-06 DIAGNOSIS — K760 Fatty (change of) liver, not elsewhere classified: Secondary | ICD-10-CM | POA: Diagnosis not present

## 2019-05-06 DIAGNOSIS — M47816 Spondylosis without myelopathy or radiculopathy, lumbar region: Secondary | ICD-10-CM | POA: Diagnosis not present

## 2019-05-06 DIAGNOSIS — K429 Umbilical hernia without obstruction or gangrene: Secondary | ICD-10-CM | POA: Diagnosis not present

## 2019-05-06 DIAGNOSIS — Z20822 Contact with and (suspected) exposure to covid-19: Secondary | ICD-10-CM | POA: Diagnosis not present

## 2019-05-06 DIAGNOSIS — E785 Hyperlipidemia, unspecified: Secondary | ICD-10-CM | POA: Diagnosis not present

## 2019-05-06 DIAGNOSIS — Z6838 Body mass index (BMI) 38.0-38.9, adult: Secondary | ICD-10-CM | POA: Diagnosis not present

## 2019-05-06 DIAGNOSIS — R112 Nausea with vomiting, unspecified: Secondary | ICD-10-CM

## 2019-05-06 DIAGNOSIS — F329 Major depressive disorder, single episode, unspecified: Secondary | ICD-10-CM | POA: Diagnosis not present

## 2019-05-06 DIAGNOSIS — K219 Gastro-esophageal reflux disease without esophagitis: Secondary | ICD-10-CM | POA: Diagnosis not present

## 2019-05-06 DIAGNOSIS — Z833 Family history of diabetes mellitus: Secondary | ICD-10-CM | POA: Diagnosis not present

## 2019-05-06 DIAGNOSIS — Z8261 Family history of arthritis: Secondary | ICD-10-CM | POA: Diagnosis not present

## 2019-05-06 DIAGNOSIS — E042 Nontoxic multinodular goiter: Secondary | ICD-10-CM | POA: Diagnosis not present

## 2019-05-06 DIAGNOSIS — F4310 Post-traumatic stress disorder, unspecified: Secondary | ICD-10-CM | POA: Diagnosis not present

## 2019-05-06 DIAGNOSIS — M542 Cervicalgia: Secondary | ICD-10-CM | POA: Diagnosis not present

## 2019-05-06 DIAGNOSIS — G4733 Obstructive sleep apnea (adult) (pediatric): Secondary | ICD-10-CM | POA: Diagnosis not present

## 2019-05-06 DIAGNOSIS — Z79899 Other long term (current) drug therapy: Secondary | ICD-10-CM | POA: Diagnosis not present

## 2019-05-06 DIAGNOSIS — J4520 Mild intermittent asthma, uncomplicated: Secondary | ICD-10-CM | POA: Diagnosis not present

## 2019-05-06 DIAGNOSIS — Z8249 Family history of ischemic heart disease and other diseases of the circulatory system: Secondary | ICD-10-CM | POA: Diagnosis not present

## 2019-05-06 HISTORY — PX: LAPAROSCOPIC APPENDECTOMY: SHX408

## 2019-05-06 LAB — HIV ANTIBODY (ROUTINE TESTING W REFLEX): HIV Screen 4th Generation wRfx: NONREACTIVE

## 2019-05-06 LAB — RESPIRATORY PANEL BY RT PCR (FLU A&B, COVID)
Influenza A by PCR: NEGATIVE
Influenza B by PCR: NEGATIVE
SARS Coronavirus 2 by RT PCR: NEGATIVE

## 2019-05-06 SURGERY — APPENDECTOMY, LAPAROSCOPIC
Anesthesia: General

## 2019-05-06 MED ORDER — METOPROLOL SUCCINATE ER 100 MG PO TB24
100.0000 mg | ORAL_TABLET | Freq: Two times a day (BID) | ORAL | Status: DC
  Administered 2019-05-06: 100 mg via ORAL
  Filled 2019-05-06: qty 1

## 2019-05-06 MED ORDER — METRONIDAZOLE IN NACL 5-0.79 MG/ML-% IV SOLN
500.0000 mg | INTRAVENOUS | Status: DC
  Filled 2019-05-06: qty 100

## 2019-05-06 MED ORDER — LACTATED RINGERS IV SOLN: INTRAVENOUS | Status: DC

## 2019-05-06 MED ORDER — METOPROLOL TARTRATE 5 MG/5ML IV SOLN
5.0000 mg | Freq: Four times a day (QID) | INTRAVENOUS | Status: DC | PRN
  Administered 2019-05-06: 5 mg via INTRAVENOUS
  Filled 2019-05-06: qty 5

## 2019-05-06 MED ORDER — FENTANYL CITRATE (PF) 100 MCG/2ML IJ SOLN: 25.0000 ug | INTRAMUSCULAR | Status: DC | PRN

## 2019-05-06 MED ORDER — MIDAZOLAM HCL 5 MG/5ML IJ SOLN
INTRAMUSCULAR | Status: DC | PRN
  Administered 2019-05-06: 2 mg via INTRAVENOUS

## 2019-05-06 MED ORDER — HYDROMORPHONE HCL 1 MG/ML IJ SOLN: 0.5000 mg | INTRAMUSCULAR | Status: DC | PRN

## 2019-05-06 MED ORDER — PANTOPRAZOLE SODIUM 40 MG PO TBEC: 40.0000 mg | DELAYED_RELEASE_TABLET | Freq: Every day | ORAL | Status: DC

## 2019-05-06 MED ORDER — SODIUM CHLORIDE 0.9 % IV SOLN
2.0000 g | INTRAVENOUS | Status: DC
  Filled 2019-05-06: qty 20

## 2019-05-06 MED ORDER — ONDANSETRON HCL 4 MG/2ML IJ SOLN
4.0000 mg | Freq: Once | INTRAMUSCULAR | Status: AC
  Administered 2019-05-06: 4 mg via INTRAVENOUS
  Filled 2019-05-06: qty 2

## 2019-05-06 MED ORDER — SUCCINYLCHOLINE CHLORIDE 200 MG/10ML IV SOSY
PREFILLED_SYRINGE | INTRAVENOUS | Status: AC
  Filled 2019-05-06: qty 10

## 2019-05-06 MED ORDER — METRONIDAZOLE IN NACL 5-0.79 MG/ML-% IV SOLN
500.0000 mg | Freq: Three times a day (TID) | INTRAVENOUS | Status: DC
  Administered 2019-05-06: 500 mg via INTRAVENOUS

## 2019-05-06 MED ORDER — ACETAMINOPHEN 500 MG PO TABS
1000.0000 mg | ORAL_TABLET | ORAL | Status: AC
  Administered 2019-05-06: 1000 mg via ORAL
  Filled 2019-05-06: qty 2

## 2019-05-06 MED ORDER — SODIUM CHLORIDE 0.9 % IV SOLN
INTRAVENOUS | Status: DC | PRN
  Administered 2019-05-06: 1000 mL via INTRAVENOUS

## 2019-05-06 MED ORDER — PROPOFOL 500 MG/50ML IV EMUL
INTRAVENOUS | Status: AC
  Filled 2019-05-06: qty 50

## 2019-05-06 MED ORDER — ROCURONIUM BROMIDE 10 MG/ML (PF) SYRINGE
PREFILLED_SYRINGE | INTRAVENOUS | Status: AC
  Filled 2019-05-06: qty 10

## 2019-05-06 MED ORDER — ONDANSETRON 4 MG PO TBDP: 4.0000 mg | ORAL_TABLET | Freq: Four times a day (QID) | ORAL | 0 refills | Status: DC | PRN

## 2019-05-06 MED ORDER — DEXAMETHASONE SODIUM PHOSPHATE 10 MG/ML IJ SOLN
INTRAMUSCULAR | Status: AC
  Filled 2019-05-06: qty 1

## 2019-05-06 MED ORDER — DEXAMETHASONE SODIUM PHOSPHATE 4 MG/ML IJ SOLN
4.0000 mg | INTRAMUSCULAR | Status: AC
  Administered 2019-05-06: 4 mg via INTRAVENOUS

## 2019-05-06 MED ORDER — ACETAMINOPHEN 650 MG RE SUPP: 650.0000 mg | Freq: Four times a day (QID) | RECTAL | Status: DC | PRN

## 2019-05-06 MED ORDER — ACETAMINOPHEN 500 MG PO TABS: 1000.0000 mg | ORAL_TABLET | Freq: Once | ORAL | Status: DC

## 2019-05-06 MED ORDER — TRAMADOL HCL 50 MG PO TABS
ORAL_TABLET | ORAL | Status: AC
  Filled 2019-05-06: qty 1

## 2019-05-06 MED ORDER — PROPOFOL 10 MG/ML IV BOLUS
INTRAVENOUS | Status: AC
  Filled 2019-05-06: qty 20

## 2019-05-06 MED ORDER — SODIUM CHLORIDE 0.9 % IV SOLN: 2.0000 g | INTRAVENOUS | Status: DC

## 2019-05-06 MED ORDER — SUGAMMADEX SODIUM 500 MG/5ML IV SOLN
INTRAVENOUS | Status: AC
  Filled 2019-05-06: qty 5

## 2019-05-06 MED ORDER — BUPIVACAINE HCL (PF) 0.25 % IJ SOLN
INTRAMUSCULAR | Status: AC
  Filled 2019-05-06: qty 30

## 2019-05-06 MED ORDER — ALBUTEROL SULFATE HFA 108 (90 BASE) MCG/ACT IN AERS: 2.0000 | INHALATION_SPRAY | RESPIRATORY_TRACT | Status: DC | PRN

## 2019-05-06 MED ORDER — SIMETHICONE 80 MG PO CHEW
40.0000 mg | CHEWABLE_TABLET | Freq: Four times a day (QID) | ORAL | Status: DC | PRN
  Filled 2019-05-06: qty 1

## 2019-05-06 MED ORDER — ONDANSETRON HCL 4 MG/2ML IJ SOLN
4.0000 mg | Freq: Four times a day (QID) | INTRAMUSCULAR | Status: DC | PRN
  Administered 2019-05-06: 4 mg via INTRAVENOUS
  Filled 2019-05-06: qty 2

## 2019-05-06 MED ORDER — ATORVASTATIN CALCIUM 10 MG PO TABS: 10.0000 mg | ORAL_TABLET | Freq: Every day | ORAL | Status: DC

## 2019-05-06 MED ORDER — BISACODYL 10 MG RE SUPP: 10.0000 mg | Freq: Every day | RECTAL | Status: DC | PRN

## 2019-05-06 MED ORDER — METRONIDAZOLE IN NACL 5-0.79 MG/ML-% IV SOLN
500.0000 mg | Freq: Once | INTRAVENOUS | Status: AC
  Administered 2019-05-06: 500 mg via INTRAVENOUS
  Filled 2019-05-06: qty 100

## 2019-05-06 MED ORDER — SODIUM CHLORIDE 0.9 % IV SOLN: Freq: Three times a day (TID) | INTRAVENOUS | Status: DC | PRN

## 2019-05-06 MED ORDER — PROCHLORPERAZINE EDISYLATE 10 MG/2ML IJ SOLN: 5.0000 mg | Freq: Four times a day (QID) | INTRAMUSCULAR | Status: DC | PRN

## 2019-05-06 MED ORDER — ONDANSETRON HCL 4 MG/2ML IJ SOLN
INTRAMUSCULAR | Status: AC
  Filled 2019-05-06: qty 2

## 2019-05-06 MED ORDER — ROCURONIUM BROMIDE 50 MG/5ML IV SOSY
PREFILLED_SYRINGE | INTRAVENOUS | Status: DC | PRN
  Administered 2019-05-06: 50 mg via INTRAVENOUS

## 2019-05-06 MED ORDER — ENOXAPARIN SODIUM 40 MG/0.4ML ~~LOC~~ SOLN: 40.0000 mg | SUBCUTANEOUS | Status: DC

## 2019-05-06 MED ORDER — HYDRALAZINE HCL 10 MG PO TABS: 50.0000 mg | ORAL_TABLET | Freq: Three times a day (TID) | ORAL | Status: DC

## 2019-05-06 MED ORDER — LIDOCAINE 2% (20 MG/ML) 5 ML SYRINGE
INTRAMUSCULAR | Status: AC
  Filled 2019-05-06: qty 5

## 2019-05-06 MED ORDER — CHLORHEXIDINE GLUCONATE CLOTH 2 % EX PADS: 6.0000 | MEDICATED_PAD | Freq: Once | CUTANEOUS | Status: DC

## 2019-05-06 MED ORDER — SUCCINYLCHOLINE CHLORIDE 200 MG/10ML IV SOSY
PREFILLED_SYRINGE | INTRAVENOUS | Status: DC | PRN
  Administered 2019-05-06: 200 mg via INTRAVENOUS

## 2019-05-06 MED ORDER — ONDANSETRON 4 MG PO TBDP: 4.0000 mg | ORAL_TABLET | Freq: Four times a day (QID) | ORAL | Status: DC | PRN

## 2019-05-06 MED ORDER — PROMETHAZINE HCL 25 MG/ML IJ SOLN: 6.2500 mg | INTRAMUSCULAR | Status: DC | PRN

## 2019-05-06 MED ORDER — DIPHENHYDRAMINE HCL 50 MG/ML IJ SOLN: 12.5000 mg | Freq: Four times a day (QID) | INTRAMUSCULAR | Status: DC | PRN

## 2019-05-06 MED ORDER — GABAPENTIN 300 MG PO CAPS
300.0000 mg | ORAL_CAPSULE | ORAL | Status: AC
  Administered 2019-05-06: 300 mg via ORAL
  Filled 2019-05-06: qty 1

## 2019-05-06 MED ORDER — LORATADINE 10 MG PO TABS: 10.0000 mg | ORAL_TABLET | Freq: Every day | ORAL | Status: DC

## 2019-05-06 MED ORDER — LOSARTAN POTASSIUM 25 MG PO TABS
25.0000 mg | ORAL_TABLET | Freq: Every day | ORAL | Status: DC
  Filled 2019-05-06: qty 1

## 2019-05-06 MED ORDER — ACETAMINOPHEN 325 MG PO TABS: 650.0000 mg | ORAL_TABLET | Freq: Four times a day (QID) | ORAL | Status: DC | PRN

## 2019-05-06 MED ORDER — FENTANYL CITRATE (PF) 250 MCG/5ML IJ SOLN
INTRAMUSCULAR | Status: AC
  Filled 2019-05-06: qty 5

## 2019-05-06 MED ORDER — TRAMADOL HCL 50 MG PO TABS
50.0000 mg | ORAL_TABLET | Freq: Four times a day (QID) | ORAL | Status: DC | PRN
  Administered 2019-05-06: 50 mg via ORAL

## 2019-05-06 MED ORDER — VALACYCLOVIR HCL 500 MG PO TABS: 1000.0000 mg | ORAL_TABLET | Freq: Every day | ORAL | Status: DC

## 2019-05-06 MED ORDER — MIDAZOLAM HCL 2 MG/2ML IJ SOLN
INTRAMUSCULAR | Status: AC
  Filled 2019-05-06: qty 2

## 2019-05-06 MED ORDER — 0.9 % SODIUM CHLORIDE (POUR BTL) OPTIME
TOPICAL | Status: DC | PRN
  Administered 2019-05-06: 1000 mL

## 2019-05-06 MED ORDER — BUPIVACAINE HCL (PF) 0.25 % IJ SOLN
INTRAMUSCULAR | Status: DC | PRN
  Administered 2019-05-06: 30 mL

## 2019-05-06 MED ORDER — LIDOCAINE 2% (20 MG/ML) 5 ML SYRINGE
INTRAMUSCULAR | Status: DC | PRN
  Administered 2019-05-06: 100 mg via INTRAVENOUS

## 2019-05-06 MED ORDER — LACTATED RINGERS IR SOLN
Status: DC | PRN
  Administered 2019-05-06: 1000 mL

## 2019-05-06 MED ORDER — MORPHINE SULFATE (PF) 4 MG/ML IV SOLN
4.0000 mg | INTRAVENOUS | Status: DC | PRN
  Administered 2019-05-06 (×2): 4 mg via INTRAVENOUS
  Filled 2019-05-06 (×2): qty 1

## 2019-05-06 MED ORDER — MORPHINE SULFATE (PF) 4 MG/ML IV SOLN
4.0000 mg | Freq: Once | INTRAVENOUS | Status: AC
  Administered 2019-05-06: 4 mg via INTRAVENOUS
  Filled 2019-05-06: qty 1

## 2019-05-06 MED ORDER — FLUTICASONE PROPIONATE 50 MCG/ACT NA SUSP: 2.0000 | Freq: Every day | NASAL | Status: DC | PRN

## 2019-05-06 MED ORDER — DIPHENHYDRAMINE HCL 12.5 MG/5ML PO ELIX: 12.5000 mg | ORAL_SOLUTION | Freq: Four times a day (QID) | ORAL | Status: DC | PRN

## 2019-05-06 MED ORDER — PROCHLORPERAZINE MALEATE 10 MG PO TABS: 10.0000 mg | ORAL_TABLET | Freq: Four times a day (QID) | ORAL | Status: DC | PRN

## 2019-05-06 MED ORDER — FAMOTIDINE 20 MG PO TABS: 20.0000 mg | ORAL_TABLET | Freq: Two times a day (BID) | ORAL | Status: DC

## 2019-05-06 MED ORDER — SUGAMMADEX SODIUM 500 MG/5ML IV SOLN
INTRAVENOUS | Status: DC | PRN
  Administered 2019-05-06: 300 mg via INTRAVENOUS

## 2019-05-06 MED ORDER — PROPOFOL 500 MG/50ML IV EMUL
INTRAVENOUS | Status: DC | PRN
  Administered 2019-05-06: 150 ug/kg/min via INTRAVENOUS

## 2019-05-06 MED ORDER — BUPROPION HCL ER (XL) 150 MG PO TB24: 300.0000 mg | ORAL_TABLET | Freq: Every day | ORAL | Status: DC

## 2019-05-06 MED ORDER — ONDANSETRON HCL 4 MG/2ML IJ SOLN
INTRAMUSCULAR | Status: DC | PRN
  Administered 2019-05-06: 4 mg via INTRAVENOUS

## 2019-05-06 MED ORDER — PROPOFOL 10 MG/ML IV BOLUS
INTRAVENOUS | Status: DC | PRN
  Administered 2019-05-06: 200 mg via INTRAVENOUS

## 2019-05-06 MED ORDER — SODIUM CHLORIDE 0.9 % IV SOLN
2.0000 g | Freq: Once | INTRAVENOUS | Status: AC
  Administered 2019-05-06: 02:00:00 2 g via INTRAVENOUS
  Filled 2019-05-06: qty 20

## 2019-05-06 MED ORDER — FENTANYL CITRATE (PF) 100 MCG/2ML IJ SOLN
INTRAMUSCULAR | Status: DC | PRN
  Administered 2019-05-06: 50 ug via INTRAVENOUS
  Administered 2019-05-06: 100 ug via INTRAVENOUS

## 2019-05-06 MED ORDER — SCOPOLAMINE 1 MG/3DAYS TD PT72
1.0000 | MEDICATED_PATCH | TRANSDERMAL | Status: DC
  Administered 2019-05-06: 1.5 mg via TRANSDERMAL
  Filled 2019-05-06: qty 1

## 2019-05-06 SURGICAL SUPPLY — 45 items
APPLIER CLIP 5 13 M/L LIGAMAX5 (MISCELLANEOUS)
APPLIER CLIP ROT 10 11.4 M/L (STAPLE)
CABLE HIGH FREQUENCY MONO STRZ (ELECTRODE) ×3 IMPLANT
CHLORAPREP W/TINT 26 (MISCELLANEOUS) ×3 IMPLANT
CLIP APPLIE 5 13 M/L LIGAMAX5 (MISCELLANEOUS) IMPLANT
CLIP APPLIE ROT 10 11.4 M/L (STAPLE) IMPLANT
COVER SURGICAL LIGHT HANDLE (MISCELLANEOUS) ×3 IMPLANT
COVER WAND RF STERILE (DRAPES) IMPLANT
CUTTER FLEX LINEAR 45M (STAPLE) ×3 IMPLANT
DECANTER SPIKE VIAL GLASS SM (MISCELLANEOUS) IMPLANT
DERMABOND ADVANCED (GAUZE/BANDAGES/DRESSINGS) ×2
DERMABOND ADVANCED .7 DNX12 (GAUZE/BANDAGES/DRESSINGS) ×1 IMPLANT
DRAIN CHANNEL 19F RND (DRAIN) IMPLANT
ELECT REM PT RETURN 15FT ADLT (MISCELLANEOUS) ×3 IMPLANT
ENDOLOOP SUT PDS II  0 18 (SUTURE)
ENDOLOOP SUT PDS II 0 18 (SUTURE) IMPLANT
EVACUATOR SILICONE 100CC (DRAIN) IMPLANT
GLOVE BIO SURGEON STRL SZ 6 (GLOVE) ×3 IMPLANT
GLOVE INDICATOR 6.5 STRL GRN (GLOVE) ×3 IMPLANT
GOWN STRL REUS W/ TWL LRG LVL3 (GOWN DISPOSABLE) ×1 IMPLANT
GOWN STRL REUS W/TWL LRG LVL3 (GOWN DISPOSABLE) ×2
GOWN STRL REUS W/TWL XL LVL3 (GOWN DISPOSABLE) IMPLANT
GRASPER SUT TROCAR 14GX15 (MISCELLANEOUS) ×3 IMPLANT
IRRIG SUCT STRYKERFLOW 2 WTIP (MISCELLANEOUS) ×3
IRRIGATION SUCT STRKRFLW 2 WTP (MISCELLANEOUS) ×1 IMPLANT
KIT BASIN OR (CUSTOM PROCEDURE TRAY) ×3 IMPLANT
KIT TURNOVER KIT A (KITS) ×3 IMPLANT
NEEDLE INSUFFLATION 14GA 120MM (NEEDLE) ×3 IMPLANT
PENCIL SMOKE EVACUATOR (MISCELLANEOUS) IMPLANT
POUCH SPECIMEN RETRIEVAL 10MM (ENDOMECHANICALS) ×3 IMPLANT
RELOAD 45 VASCULAR/THIN (ENDOMECHANICALS) ×3 IMPLANT
RELOAD STAPLE TA45 3.5 REG BLU (ENDOMECHANICALS) IMPLANT
SCISSORS LAP 5X35 DISP (ENDOMECHANICALS) ×3 IMPLANT
SET TUBE SMOKE EVAC HIGH FLOW (TUBING) ×3 IMPLANT
SHEARS HARMONIC ACE PLUS 36CM (ENDOMECHANICALS) ×3 IMPLANT
SLEEVE XCEL OPT CAN 5 100 (ENDOMECHANICALS) ×3 IMPLANT
SUT ETHILON 2 0 PS N (SUTURE) IMPLANT
SUT MNCRL AB 4-0 PS2 18 (SUTURE) ×3 IMPLANT
TOWEL OR 17X26 10 PK STRL BLUE (TOWEL DISPOSABLE) ×3 IMPLANT
TOWEL OR NON WOVEN STRL DISP B (DISPOSABLE) ×3 IMPLANT
TRAY FOLEY MTR SLVR 14FR STAT (SET/KITS/TRAYS/PACK) IMPLANT
TRAY FOLEY MTR SLVR 16FR STAT (SET/KITS/TRAYS/PACK) IMPLANT
TRAY LAPAROSCOPIC (CUSTOM PROCEDURE TRAY) ×3 IMPLANT
TROCAR BLADELESS OPT 5 100 (ENDOMECHANICALS) ×3 IMPLANT
TROCAR XCEL 12X100 BLDLESS (ENDOMECHANICALS) ×3 IMPLANT

## 2019-05-06 NOTE — Transfer of Care (Signed)
Immediate Anesthesia Transfer of Care Note  Patient: Charles Nash.  Procedure(s) Performed: APPENDECTOMY LAPAROSCOPIC (N/A )  Patient Location: PACU  Anesthesia Type:General  Level of Consciousness: drowsy  Airway & Oxygen Therapy: Patient Spontanous Breathing and Patient connected to face mask oxygen  Post-op Assessment: Report given to RN and Post -op Vital signs reviewed and stable  Post vital signs: Reviewed and stable  Last Vitals:  Vitals Value Taken Time  BP    Temp    Pulse 92 05/06/19 1348  Resp 20 05/06/19 1348  SpO2 100 % 05/06/19 1348  Vitals shown include unvalidated device data.  Last Pain:  Vitals:   05/06/19 1112  TempSrc: Oral  PainSc:          Complications: No apparent anesthesia complications

## 2019-05-06 NOTE — Discharge Instructions (Signed)
[facility] LAPAROSCOPIC SURGERY: POST OP INSTRUCTIONS Always review your discharge instruction sheet given to you by the facility where your surgery was performed. IF YOU HAVE DISABILITY OR FAMILY LEAVE FORMS, YOU MUST BRING THEM TO THE OFFICE FOR PROCESSING.   DO NOT GIVE THEM TO YOUR DOCTOR.  PAIN CONTROL  1. First take acetaminophen (Tylenol) AND/or ibuprofen (Advil) to control your pain after surgery.  Follow directions on package.  Taking acetaminophen (Tylenol) and/or ibuprofen (Advil) regularly after surgery will help to control your pain and lower the amount of prescription pain medication you may need.  You should not take more than 3,000 mg (3 grams) of acetaminophen (Tylenol) in 24 hours.  You should not take ibuprofen (Advil), aleve, motrin, naprosyn or other NSAIDS if you have a history of stomach ulcers or chronic kidney disease.  2. A prescription for pain medication may be given to you upon discharge.  Take your pain medication as prescribed, if you still have uncontrolled pain after taking acetaminophen (Tylenol) or ibuprofen (Advil). 3. Use ice packs to help control pain. 4. If you need a refill on your pain medication, please contact your pharmacy.  They will contact our office to request authorization. Prescriptions will not be filled after 5pm or on week-ends.  HOME MEDICATIONS 5. Take your usually prescribed medications unless otherwise directed.  DIET 6. You should follow a light diet the first few days after arrival home.  Be sure to include lots of fluids daily. Avoid fatty, fried foods.   CONSTIPATION 7. It is common to experience some constipation after surgery and if you are taking pain medication.  Increasing fluid intake and taking a stool softener (such as Colace) will usually help or prevent this problem from occurring.  A mild laxative (Milk of Magnesia or Miralax) should be taken according to package instructions if there are no bowel  movements after 48 hours.  WOUND/INCISION CARE 8. Most patients will experience some swelling and bruising in the area of the incisions.  Ice packs will help.  Swelling and bruising can take several days to resolve.  9. Unless discharge instructions indicate otherwise, follow guidelines below  a. STERI-STRIPS - you may remove your outer bandages 48 hours after surgery, and you may shower at that time.  You have steri-strips (small skin tapes) in place directly over the incision.  These strips should be left on the skin for 7-10 days.   b. DERMABOND/SKIN GLUE - you may shower in 24 hours.  The glue will flake off over the next 2-3 weeks. 10. Any sutures or staples will be removed at the office during your follow-up visit.  ACTIVITIES 11. You may resume regular (light) daily activities beginning the next day--such as daily self-care, walking, climbing stairs--gradually increasing activities as tolerated.  You may have sexual intercourse when it is comfortable.  Refrain from any heavy lifting or straining until approved by your doctor. a. You may drive when you are no longer taking prescription pain medication, you can comfortably wear a seatbelt, and you can safely maneuver your car and apply brakes.  FOLLOW-UP 12. You should see your doctor in the office for a follow-up appointment approximately 2-3 weeks after your surgery.  You should have been given your post-op/follow-up appointment when your surgery was scheduled.  If you did not receive a post-op/follow-up appointment, make sure that you call for this appointment within a day or two after you arrive home to insure a convenient appointment time.     WHEN TO CALL YOUR DOCTOR: 1. Fever over 101.0 2. Inability to urinate 3. Continued bleeding from incision. 4. Increased pain, redness, or drainage from the incision. 5. Increasing abdominal pain  The clinic staff is available to answer your questions during regular business hours.  Please don't  hesitate to call and ask to speak to one of the nurses for clinical concerns.  If you have a medical emergency, go to the nearest emergency room or call 911.  A surgeon from [facility] is always on call at the hospital. [address], [address], [place], [place]  [address] ? [address] [address], [place], [place]   [address] [phone] ? [phone] ? FAX [phone] Web site: [email or web address]  .........   Managing Your Pain After Surgery Without Opioids    Thank you for participating in our program to help patients manage their pain after surgery without opioids. This is part of our effort to provide you with the best care possible, without exposing you or your family to the risk that opioids pose.  What pain can I expect after surgery? You can expect to have some pain after surgery. This is normal. The pain is typically worse the day after surgery, and quickly begins to get better. Many studies have found that many patients are able to manage their pain after surgery with Over-the-Counter (OTC) medications such as Tylenol and Motrin. If you have a condition that does not allow you to take Tylenol or Motrin, notify your surgical team.  How will I manage my pain? The best strategy for controlling your pain after surgery is around the clock pain control with Tylenol (acetaminophen) and Motrin (ibuprofen or Advil). Alternating these medications with each other allows you to maximize your pain control. In addition to Tylenol and Motrin, you can use heating pads or ice packs on your incisions to help reduce your pain.  How will I alternate your regular strength over-the-counter pain medication? You will take a dose of pain medication every three hours. ; Start by taking 650 mg of Tylenol (2 pills of 325 mg) ; 3 hours later take 600 mg of Motrin (3 pills of 200 mg) ; 3 hours after taking the Motrin take 650 mg of Tylenol ; 3 hours after that take 600 mg of  Motrin.   - 1 -  See example - if your first dose of Tylenol is at 12:00 PM   12:00 PM Tylenol 650 mg (2 pills of 325 mg)  3:00 PM Motrin 600 mg (3 pills of 200 mg)  6:00 PM Tylenol 650 mg (2 pills of 325 mg)  9:00 PM Motrin 600 mg (3 pills of 200 mg)  Continue alternating every 3 hours   We recommend that you follow this schedule around-the-clock for at least 3 days after surgery, or until you feel that it is no longer needed. Use the table on the last page of this handout to keep track of the medications you are taking. Important: Do not take more than 3000mg of Tylenol or 3200mg of Motrin in a 24-hour period. Do not take ibuprofen/Motrin if you have a history of bleeding stomach ulcers, severe kidney disease, &/or actively taking a blood thinner  What if I still have pain? If you have pain that is not controlled with the over-the-counter pain medications (Tylenol and Motrin or Advil) you might have what we call "breakthrough" pain. You will receive a prescription for a small amount of an opioid pain medication such as   Oxycodone, Tramadol, or Tylenol with Codeine. Use these opioid pills in the first 24 hours after surgery if you have breakthrough pain. Do not take more than 1 pill every 4-6 hours.  If you still have uncontrolled pain after using all opioid pills, don't hesitate to call our staff using the number provided. We will help make sure you are managing your pain in the best way possible, and if necessary, we can provide a prescription for additional pain medication.   Day 1    Time  Name of Medication Number of pills taken  Amount of Acetaminophen  Pain Level   Comments  AM PM       AM PM       AM PM       AM PM       AM PM       AM PM       AM PM       AM PM       Total Daily amount of Acetaminophen Do not take more than  3,000 mg per day      Day 2    Time  Name of Medication Number of pills taken  Amount of Acetaminophen  Pain Level   Comments  AM  PM       AM PM       AM PM       AM PM       AM PM       AM PM       AM PM       AM PM       Total Daily amount of Acetaminophen Do not take more than  3,000 mg per day      Day 3    Time  Name of Medication Number of pills taken  Amount of Acetaminophen  Pain Level   Comments  AM PM       AM PM       AM PM       AM PM          AM PM       AM PM       AM PM       AM PM       Total Daily amount of Acetaminophen Do not take more than  3,000 mg per day      Day 4    Time  Name of Medication Number of pills taken  Amount of Acetaminophen  Pain Level   Comments  AM PM       AM PM       AM PM       AM PM       AM PM       AM PM       AM PM       AM PM       Total Daily amount of Acetaminophen Do not take more than  3,000 mg per day      Day 5    Time  Name of Medication Number of pills taken  Amount of Acetaminophen  Pain Level   Comments  AM PM       AM PM       AM PM       AM PM       AM PM       AM PM       AM PM         AM PM       Total Daily amount of Acetaminophen Do not take more than  3,000 mg per day       Day 6    Time  Name of Medication Number of pills taken  Amount of Acetaminophen  Pain Level  Comments  AM PM       AM PM       AM PM       AM PM       AM PM       AM PM       AM PM       AM PM       Total Daily amount of Acetaminophen Do not take more than  3,000 mg per day      Day 7    Time  Name of Medication Number of pills taken  Amount of Acetaminophen  Pain Level   Comments  AM PM       AM PM       AM PM       AM PM       AM PM       AM PM       AM PM       AM PM       Total Daily amount of Acetaminophen Do not take more than  3,000 mg per day        For additional information about how and where to safely dispose of unused opioid medications - https://www.morepowerfulnc.org  Disclaimer: This document contains information and/or instructional materials adapted from [facility]  for the typical patient with your condition. It does not replace medical advice from your health care provider because your experience may differ from that of the typical patient. Talk to your health care provider if you have any questions about this document, your condition or your treatment plan. Adapted from [facility]  

## 2019-05-06 NOTE — Progress Notes (Addendum)
Received message from my office staff that the patient's POA would like me to call her- I attempted the number given and there was no answer. When I asked Mr. Bisaillon in preop who the patient would like me to contact to update after surgery, he declined for me to do so, therefore I did not attempt to contact anyone until now. I have called the patient who is on his way home from the hospital and is doing fine, I went over the operative findings with him and he states he will update his family member.

## 2019-05-06 NOTE — ED Provider Notes (Signed)
3:29 AM Patient transferring from [place] [place] where he was diagnosed with acute appendicitis.  He is here to see Dr. Johney Maine of general surgery.  His abdomen is soft with right lower quadrant tenderness.  He is requesting some additional pain medication.   Neve Branscomb, Jenny Reichmann, MD 05/06/19 [phone]

## 2019-05-06 NOTE — ED Notes (Signed)
Called [phone]  For general surgery

## 2019-05-06 NOTE — Anesthesia Preprocedure Evaluation (Addendum)
Anesthesia Evaluation   Patient awake    Reviewed: Allergy & Precautions, NPO status , Patient's Chart, lab work & pertinent test results, reviewed documented beta blocker date and time   History of Anesthesia Complications (+) PONV and history of anesthetic complications  Airway Mallampati: II  TM Distance: >3 FB Neck ROM: Full    Dental no notable dental hx.    Pulmonary asthma , sleep apnea ,    Pulmonary exam normal        Cardiovascular hypertension, Pt. on medications and Pt. on home beta blockers Normal cardiovascular exam     Neuro/Psych Anxiety Depression negative neurological ROS     GI/Hepatic Neg liver ROS, GERD  Medicated and Controlled,  Endo/Other  diabetes, Type 2Morbid obesity  Renal/GU negative Renal ROS  negative genitourinary   Musculoskeletal negative musculoskeletal ROS (+)   Abdominal   Peds  Hematology negative hematology ROS (+)   Anesthesia Other Findings Day of surgery medications reviewed with patient.  Reproductive/Obstetrics negative OB ROS                           Anesthesia Physical Anesthesia Plan  ASA: III  Anesthesia Plan: General   Post-op Pain Management:    Induction: Intravenous and Rapid sequence  PONV Risk Score and Plan: 4 or greater and Treatment may vary due to age or medical condition, Ondansetron, Dexamethasone, Midazolam, Scopolamine patch - Pre-op, Propofol infusion and TIVA  Airway Management Planned: Oral ETT  Additional Equipment: None  Intra-op Plan:   Post-operative Plan: Extubation in OR  Informed Consent: I have reviewed the patients History and Physical, chart, labs and discussed the procedure including the risks, benefits and alternatives for the proposed anesthesia with the patient or authorized representative who has indicated his/her understanding and acceptance.     Dental advisory given  Plan Discussed with:  [facility]NA  Anesthesia Plan Comments:       Anesthesia Quick Evaluation

## 2019-05-06 NOTE — Op Note (Signed)
Operative Report  Charles Nash 54 y.o. male  [id number]  [id number]  05/06/2019  Surgeon: Clovis Riley   Assistant: none  Procedure performed: Laparoscopic Appendectomy  Preop diagnosis: Acute appendicitis  Post-op diagnosis/intraop findings: Acute appendicitis vs epiploic appendagitis  Specimens: appendix  EBL: minimal  Complications: none  Description of procedure: After obtaining informed consent the patient was brought to the operating room. Antibiotics had been administered. SCD's were applied. General endotracheal anesthesia was initiated and a formal time-out was performed. Foley catheter was inserted which is removed at the end of the case. The abdomen was prepped and draped in the usual sterile fashion and the abdomen was entered using an infraumbilical Veress needle and insufflated to 15 mmHg. A 5 mm trocar and camera were then introduced, the abdomen was inspected and there is no evidence of injury from our entry. A suprapubic 5 mm trocar and a left lower quadrant 12 mm trocar were introduced under direct visualization following infiltration with local. The patient was then placed in Trendelenburg and rotated to the left and the small bowel was reflected cephalad. The appendix was visualized: it was not dilated, and while the appendix itself did not appear significantly inflammed, there was hemorrhagic appearance of a lobule of fat on the proximal mesoappendix possibly representative of epiploic appendagitis, no free fluid or purulence was present. The appendix was grasped and retracted anteriorly. Using gentle blunt dissection a window was created in the mesoappendix at the base of the appendix and a white load linear cutting stapler was used to transect the appendix from the cecum, taking a small cuff of healthy cecum with the specimen. Harmonic scalpel was then used to transect the appendiceal mesentery. Hemostasis was excellent. The appendix and the hemorragic  appearing fat lobule were placed in an Endo Catch bag and removed through our 12 mm trocar site. The small bowel was run from the ileocecal valve proximally several feet with no abnormality present. The cecum appeared normal and uninflamed. The omentum was brought down over the staple line. The 74mm trocar site in the left lower quadrant was closed with a 0 vicryl in the fascia under direct visualization using a PMI device. The abdomen was desufflated and all trocars removed. The skin incisions were closed with subcuticular monocryl and Dermabond. The patient was awakened, extubated and transported to the recovery room in stable condition.   All counts were correct at the completion of the case.

## 2019-05-06 NOTE — ED Notes (Signed)
Spoke to OR, states they are unsure about a surgery time for this patient. Therefore will wait to give medications due prior to surgery.

## 2019-05-06 NOTE — ED Notes (Addendum)
Spoke with [place] from [place]. They have not yet scheduled pt procedure that she can see. Meds and orders ordered for 60 mins prior to procedure will be delayed until a procedure time is provided. OR staff agrees and states that they are contacting the surgeon and will inform the ED of surgery time.

## 2019-05-06 NOTE — ED Notes (Signed)
Pt arrived with [facility] in no distress. He ambulated to the restroom upon arrival. Pain managed at a level 4/10. Flagyl infusing.

## 2019-05-06 NOTE — Discharge Summary (Signed)
[facility] Discharge Summary   Patient ID: Charles Nash. MRN: [id number] DOB/AGE: 15-Dec-1965 54 y.o.  Admit date: 05/05/2019 Discharge date: 05/06/2019  Admitting Diagnosis: Acute appendicitis  Discharge Diagnosis Patient Active Problem List   Diagnosis Date Noted  . Acute appendicitis 05/06/2019  . History of sleeve gastrectomy 2017 05/06/2019  . PONV (postoperative nausea and vomiting) 05/06/2019  . Recurrent urticaria 03/02/2019  . Mild intermittent asthma 03/02/2019  . Sensorineural hearing loss (SNHL), bilateral 07/29/2017  . Myofascial pain dysfunction syndrome 01/02/2017  . Insect bites 11/10/2016  . Diabetic polyneuropathy associated with type 2 diabetes mellitus ([place]) 08/25/2016  . Achilles tendinitis, left leg 08/25/2016  . Achilles tendon contracture, left 08/25/2016  . Xerosis of skin 04/22/2016  . Chronic urticaria 03/05/2016  . Sacroiliac joint dysfunction of both sides 01/01/2016  . Anxiety state 08/20/2015  . 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 Patellofemoral disorder 05/15/2015  . Current use of beta blocker 05/15/2015  . Genital herpes 10/25/2014  . Depression 10/05/2014  . Osteoarthritis of left knee 08/15/2014  . 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 Sciatica 04/22/2014  . Osteoarthritis of right knee 02/06/2014  . Allergy to mold 12/13/2013  . Weight gain, abnormal 11/08/2013  . PTSD (post-traumatic stress disorder) 07/05/2013  . Multinodular goiter 01/19/2013  . GERD (gastroesophageal reflux disease) 08/09/2012  . Dermographia 12/29/2011  . Neck pain 11/12/2011  . Fatty liver 08/06/2011  . Cervical disc disease 07/28/2011  . Preventative health care 07/28/2011  . Erectile dysfunction 05/23/2011  . Elevated liver function tests 05/20/2011  . Type II diabetes mellitus, well controlled ([place]) 05/19/2011  . Hyperlipidemia 05/19/2011  . Obesity (BMI 30-39.9) 05/19/2011  . Other allergic rhinitis  05/19/2011  . Obstructive sleep apnea treated with continuous positive airway pressure (CPAP) 05/19/2011  . Essential hypertension 09/12/2000    Consultants None  Imaging: CT ABDOMEN PELVIS W CONTRAST  Result Date: 05/06/2019 CLINICAL DATA:  Abdominal pain. Right lower quadrant pain with nausea. History of gastroplasty. EXAM: CT ABDOMEN AND PELVIS WITH CONTRAST TECHNIQUE: Multidetector CT imaging of the abdomen and pelvis was performed using the standard protocol following bolus administration of intravenous contrast. CONTRAST:  188mL OMNIPAQUE IOHEXOL 300 MG/ML  SOLN COMPARISON:  CT 1 week ago, 04/29/2019. FINDINGS: Lower chest: Lung bases are clear. Hepatobiliary: No focal liver abnormality is seen. No gallstones, gallbladder wall thickening, or biliary dilatation. Pancreas: No ductal dilatation or inflammation. Spleen: Normal in size without focal abnormality. Adrenals/Urinary Tract: Thickening of the left adrenal gland without dominant nodule. Normal right adrenal gland. No hydronephrosis or perinephric edema. Homogeneous renal enhancement with symmetric excretion on delayed phase imaging. Urinary bladder is nondistended. Stomach/Bowel: Prior gastric sleeve resection. Lack of enteric contrast limits detailed gastric evaluation. No small bowel obstruction or inflammation. Mild thickening of the proximal appendix at 8 mm. Punctate high density in the distal appendix may represent an appendicular lith for enteric contrast from prior exam. This was seen on previous. Fat stranding adjacent to the appendiceal base and posterolateral cecum. There is no associated cecal wall thickening. The terminal ileum is normal. Fat stranding is new from exam 1 week ago. Small to moderate volume of colonic stool. Vascular/Lymphatic: Multiple small retroperitoneal nodes not enlarged by size criteria. Abdominal aorta is normal in caliber. Portal vein is patent. Few prominent bilateral iliac nodes, unchanged. Reproductive:  Prominent prostate gland spans 5.2 cm. Other: No free air, free fluid, or intra-abdominal fluid collection. Tiny fat containing umbilical hernia. Musculoskeletal: There are no  acute or suspicious osseous abnormalities. Stable degenerative change in the lower lumbar spine. IMPRESSION: Mild thickening of the proximal appendix at 8 mm. Right lower quadrant fat stranding adjacent to the base of the appendix and posterolateral cecum. Findings are suspicious for early acute appendicitis, however mild focal cecal colitis or epiploic appendagitis are also considered. Regardless, no perforation or abscess. Electronically Signed   By: Keith Rake M.D.   On: 05/06/2019 00:39    Procedures Dr. Kae Heller (05/06/19) - Laparoscopic Appendectomy  Hospital Course:  Patient is a 54 year old male who presented to [facility] with abdominal pain.  Workup showed acute appendicitis.  Patient was admitted and underwent procedure listed above.  Tolerated procedure well and was transferred to the floor.  Diet was advanced as tolerated.  On POD#0, the patient was voiding well, tolerating diet, ambulating well, pain well controlled, vital signs stable, incisions c/d/i and felt stable for discharge home.  Patient will follow up with our office in 3-4 weeks and knows to call with questions or concerns.  He will call to confirm appointment date/time.    I have personally looked this patient up in the [place] Controlled Substance Database and reviewed their medications.   Allergies as of 05/06/2019      Reactions   Oxycodone Hives, Itching   Zoloft [sertraline Hcl]    ?weight gain. "made me feel weird"      Medication List    TAKE these medications   acetaminophen 500 MG tablet Commonly known as: TYLENOL Take 1,000 mg by mouth every 6 (six) hours as needed for mild pain.   Albuterol Sulfate 108 (90 Base) MCG/ACT Aepb Inhale 1-2 puffs into the lungs every 6 (six) hours as needed (wheezing, sob).   atorvastatin 10 MG  tablet Commonly known as: LIPITOR Take 1 tablet (10 mg total) by mouth daily.   buPROPion 300 MG 24 hr tablet Commonly known as: Wellbutrin XL Take 1 tablet (300 mg total) by mouth daily.   CALCIUM-MAGNESIUM-VITAMIN D PO Take 1 tablet by mouth 3 (three) times daily.   cetirizine 10 MG tablet Commonly known as: ZYRTEC Take 1 tablet (10 mg total) by mouth 2 (two) times daily. What changed:   when to take this  reasons to take this   fexofenadine 180 MG tablet Commonly known as: ALLEGRA Take 1 tablet (180 mg total) by mouth daily as needed for allergies or rhinitis.   fluticasone 50 MCG/ACT nasal spray Commonly known as: FLONASE Place 2 sprays into both nostrils daily as needed for allergies or rhinitis.   hydrALAZINE 50 MG tablet Commonly known as: APRESOLINE TAKE 1 TABLET THREE TIMES A DAY   losartan 50 MG tablet Commonly known as: COZAAR Take 1 tablet (50 mg total) by mouth daily. What changed: how much to take   metoprolol succinate 100 MG 24 hr tablet Commonly known as: Toprol XL Take 1 tablet (100 mg total) by mouth 2 (two) times daily. Take with or immediately following a meal.   Multi-Vitamins Tabs Take 1 tablet by mouth daily.   omeprazole 20 MG capsule Commonly known as: PRILOSEC Take 1 capsule (20 mg total) by mouth 2 (two) times daily before a meal.   ondansetron 4 MG disintegrating tablet Commonly known as: ZOFRAN-ODT Take 1 tablet (4 mg total) by mouth every 6 (six) hours as needed for nausea.   ondansetron 4 MG tablet Commonly known as: Zofran Take 1 tablet (4 mg total) by mouth every 8 (eight) hours as needed for nausea  or vomiting.   polyethylene glycol 17 g packet Commonly known as: MIRALAX / GLYCOLAX Take 17 g by mouth daily as needed for mild constipation.   QUEtiapine 25 MG tablet Commonly known as: SEROQUEL Take 1 tablet (25 mg total) by mouth at bedtime.   sildenafil 100 MG tablet Commonly known as: VIAGRA TAKE 1/2 TO 1 TABLET BY  MOUTH DAILY AS NEEDED   traMADol 50 MG tablet Commonly known as: Ultram Take 1 tablet (50 mg total) by mouth every 6 (six) hours as needed. What changed: reasons to take this   triamcinolone ointment 0.1 % Commonly known as: KENALOG Apply to red itchy areas only below the face   valACYclovir 1000 MG tablet Commonly known as: Valtrex Take 1 tablet (1,000 mg total) by mouth daily.   VITAMIN B12 PO Take 2 tablets by mouth daily.   Vyvanse 20 MG capsule Generic drug: lisdexamfetamine Take 20 mg by mouth daily.        Follow-up Information    [facility] [place]. Call on 05/31/2019.   Specialty: General Surgery Why: Follow up appointment scheduled for 8:45 AM. A provider will call you during scheduled appointment time. Please send a photo of incisions to [email or web address] with name and DOB prior to appointment.  Contact information: [address] [place] [address] [phone]           Signed: Brigid Re, [facility] 05/06/2019, 1:57 PM Please see Amion for pager number during day hours 7:00am-4:30pm

## 2019-05-06 NOTE — Interval H&P Note (Signed)
History and Physical Interval Note:  05/06/2019 8:14 AM  Charles Nash.  has presented today for surgery, with the diagnosis of appendicitis.  The various methods of treatment have been discussed with the patient and family. After consideration of risks, benefits and other options for treatment, the patient has consented to  Procedure(s): APPENDECTOMY LAPAROSCOPIC (N/A) as a surgical intervention.  The patient's history has been reviewed, patient examined, no change in status, stable for surgery.  I have reviewed the patient's chart and labs.  Questions were answered to the patient's satisfaction.     [name]

## 2019-05-06 NOTE — Anesthesia Postprocedure Evaluation (Signed)
Anesthesia Post Note  Patient: Charles Nash.  Procedure(s) Performed: APPENDECTOMY LAPAROSCOPIC (N/A )     Patient location during evaluation: PACU Anesthesia Type: General Level of consciousness: awake and alert, oriented and awake Pain management: pain level controlled Vital Signs Assessment: post-procedure vital signs reviewed and stable Respiratory status: spontaneous breathing, nonlabored ventilation and respiratory function stable Cardiovascular status: blood pressure returned to baseline and stable Postop Assessment: no apparent nausea or vomiting Anesthetic complications: no    Last Vitals:  Vitals:   05/06/19 1430 05/06/19 1445  BP: (!) 143/93 (!) 146/87  Pulse: 85 88  Resp: 14 15  Temp:    SpO2: 96% 97%    Last Pain:  Vitals:   05/06/19 1445  TempSrc:   PainSc: 0-No pain                 Catalina Gravel

## 2019-05-06 NOTE — ED Notes (Signed)
Report given to Alexa, short stay.

## 2019-05-06 NOTE — H&P (Addendum)
Charles Nash.  1965/06/11 [id number]  CARE TEAM:  PCP: Charles Alar, NP  Outpatient Care Team: Patient Care Team: Charles Alar, NP as PCP - General (Internal Medicine)  Inpatient Treatment Team: Treatment Team: Attending Provider: Nolon Nations, MD; Registered Nurse: Charles Risen, RN; Technician: Charles Nash, EMT; Consulting Physician: Charles Pace, Md, MD   This patient is a 54 y.o.male who presents today for surgical evaluation at the request of [facility] emergency department.   Chief complaint / Reason for evaluation: Acute appendicitis  Pleasant obese male with diabetes reflux obstructive sleep apnea and reactive airway disease.  Had abdominal pain on the right side.  Persistent and worsening over the past few days.  His wife is a Marine scientist and wondered if he had appendicitis.  Pain intensified.  He was convinced to go to the emergency room.  He has been feeling nauseated but no emesis.  No changes in his heartburn or reflux.  Examination and CT scan suspicious for appendicitis.  Surgical consultation requested.  Patient transferred for [facility] to [place] [facility].  Patient has history gastric sleeve through [facility] in 2017.  He is retired from [employer] and gets health care through the [facility].  He recalls having a colonoscopy 2 years ago by Dr. Fuller Nash with by her GI that just revealed a few small polyps.  He usually moves his bowels a few times a day.  His diabetes is much better managed since he had his weight loss surgery.  He is taking some extra weight loss medications as well.  No personal nor family history of GI/colon cancer, inflammatory bowel disease, irritable bowel syndrome, allergy such as Celiac Sprue, dietary/dairy problems, colitis, ulcers nor gastritis.  No recent sick contacts/gastroenteritis.  No travel outside the country.  No changes in diet.  No dysphagia to solids or liquids.  No significant  heartburn or reflux.  No hematochezia, hematemesis, coffee ground emesis.  No evidence of prior gastric/peptic ulceration.    Assessment  Charles Nash.  54 y.o. male       Problem List:  Principal Problem:   Acute appendicitis Active Problems:   History of sleeve gastrectomy 2017   Type II diabetes mellitus, well controlled ([name])   Essential hypertension   Hyperlipidemia   Obesity (BMI 30-39.9)   Obstructive sleep apnea treated with continuous positive airway pressure (CPAP)   GERD (gastroesophageal reflux disease)   PTSD (post-traumatic stress disorder)   Anxiety state   Mild intermittent asthma   PONV (postoperative nausea and vomiting)   Right-sided abdominal pain with inflamed appendix.  Nash:  Admit  IV fluid resuscitation  Nausea and pain control  IV antibiotics  Diagnostic laparoscopy with probable appendectomy this admission.  While there was some concern of cecal inflammation, it seems rather mild.  He has a long appendix running cephalad which correlates with his more right lateral and flank type pain.  I think he would benefit from surgery.  He agrees.  He does have concerns about postoperative nausea and vomiting.  We will try and use enhanced recovery protocol.  I recommend he discuss with anesthesia around the time of surgery.  The anatomy & physiology of the digestive tract was discussed.  The pathophysiology of appendicitis and other appendiceal disorders were discussed.  Natural history risks without surgery was discussed.   I feel the risks of no intervention will lead to serious problems that outweigh the operative risks; therefore,  I recommended diagnostic laparoscopy with removal of appendix to remove the pathology.  Laparoscopic & open techniques were discussed.   I noted a good likelihood this will help address the problem.   Risks such as bleeding, infection, abscess, leak, reoperation, injury to other organs, need for repair of tissues / organs,  possible ostomy, hernia, heart attack, stroke, death, and other risks were discussed.  Goals of post-operative recovery were discussed as well.  We will work to minimize complications.  Questions were answered.  The patient expresses understanding & wishes to proceed with surgery.   Postoperative nausea vomiting.  Start scopolamine prophylaxis.  Minimize narcotics perioperatively.  Hypertension control.  Hyperlipidemia controlled.  Obstructive sleep apnea.  CPAP as needed.  Reactive airway disease.  Asthma albuterol inhaler as needed.  Continue Flonase.  Borderline diabetes.  Definite diabetes now mostly resolved with the partial weight loss from his sleeve gastrectomy.  Hold off on any sliding scale insulin unless he develops hyperglycemia perioperatively  -VTE prophylaxis- SCDs, etc -mobilize as tolerated to help recovery  45 minutes spent in review, evaluation, examination, counseling, and coordination of care.  More than 50% of that time was spent in counseling.  Charles Hector, MD, FACS, MASCRS Gastrointestinal and Minimally Invasive Surgery  [facility] [address] [place], [place] [address] [phone] Main / Paging [phone] Fax     05/06/2019      Past Medical History:  Diagnosis Date   ADHD (attention deficit hyperactivity disorder)    Allergy    Arthritis    Bilateral varicoceles    Depression    Fatty liver 08/06/2011   Genital herpes 10/25/2014   GERD (gastroesophageal reflux disease)    Hyperlipidemia    Hypertension    OSA (obstructive sleep apnea)    Personal history of colonic polyps    Plantar fasciitis    PTSD (post-traumatic stress disorder)     Past Surgical History:  Procedure Laterality Date   ANKLE SURGERY     DOPPLER ECHOCARDIOGRAPHY  2010   FOOT SURGERY     KNEE SURGERY  08/04/08   Right knee-- medial meniscus repair, attenuation anterior cruciate ligament--James Chapman MD ([facility])    [place]     SLEEVE GASTROPLASTY  10/29/2016   VARICOCELE EXCISION      Social History   Socioeconomic History   Marital status: Divorced    Spouse name: Not on file   Number of children: 2   Years of education: Not on file   Highest education level: Not on file  Occupational History   Occupation: FOOTBALL COACH    Employer: [employer]  Tobacco Use   Smoking status: Never Smoker   Smokeless tobacco: Never Used  Substance and Sexual Activity   Alcohol use: No    Alcohol/week: 0.0 standard drinks   Drug use: No   Sexual activity: Not on file  Other Topics Concern   Not on file  Social History Narrative   Regular exercise:  No   Caffeine use:  2--32oz cups daily      Social Determinants of Health   Financial Resource Strain:    Difficulty of Paying Living Expenses: Not on file  Food Insecurity:    Worried About Estate manager/land agent of Food in the Last Year: Not on file   [employer] of Food in the Last Year: Not on file  Transportation Needs:    Film/video editor (Medical): Not on file   Lack of Transportation (Non-Medical): Not on file  Physical Activity:    Days of Exercise per Week: Not on file   Minutes of Exercise per Session: Not on file  Stress:    Feeling of Stress : Not on file  Social Connections:    Frequency of Communication with Friends and Family: Not on file   Frequency of Social Gatherings with Friends and Family: Not on file   Attends Religious Services: Not on file   Active Member of Clubs or Organizations: Not on file   Attends Archivist Meetings: Not on file   Marital Status: Not on file  Intimate Partner Violence:    Fear of Current or Ex-Partner: Not on file   Emotionally Abused: Not on file   Physically Abused: Not on file   Sexually Abused: Not on file    Family History  Problem Relation Age of Onset   Diabetes Mother     Hypertension Mother    Arthritis Mother    Cancer Mother        uterine and breast cancer, sarcoma of the brain   Hyperlipidemia Father    Arthritis Father    Cancer Father        thyroid, prostate cancer   Other Father        mass in stomach   Hyperlipidemia Maternal Grandmother    Hypertension Maternal Grandmother    Hyperlipidemia Maternal Grandfather    Hypertension Maternal Grandfather    Hyperlipidemia Paternal Grandmother    Hypertension Paternal Grandmother    Hyperlipidemia Paternal Grandfather    Hypertension Paternal Grandfather    Heart attack Paternal Grandfather    Sudden death Neg Hx    Colon cancer Neg Hx    Esophageal cancer Neg Hx    Stomach cancer Neg Hx    Rectal cancer Neg Hx     Current Facility-Administered Medications  Medication Dose Route Frequency Provider Last Rate Last Admin   0.9 %  sodium chloride infusion   Intravenous PRN Orpah Greek, MD 10 mL/hr at 05/06/19 0152 1,000 mL at 05/06/19 0152   0.9 %  sodium chloride infusion   Intravenous Q8H PRN Michael Boston, MD       acetaminophen (TYLENOL) tablet 650 mg  650 mg Oral Q6H PRN Michael Boston, MD       Or   acetaminophen (TYLENOL) suppository 650 mg  650 mg Rectal Q6H PRN Michael Boston, MD       acetaminophen (TYLENOL) tablet 1,000 mg  1,000 mg Oral On Call to OR Michael Boston, MD       albuterol (VENTOLIN HFA) 108 (90 Base) MCG/ACT inhaler 2 puff  2 puff Inhalation Q4H PRN Michael Boston, MD       atorvastatin (LIPITOR) tablet 10 mg  10 mg Oral Daily Michael Boston, MD       bisacodyl (DULCOLAX) suppository 10 mg  10 mg Rectal Daily PRN Michael Boston, MD       buPROPion (WELLBUTRIN XL) 24 hr tablet 300 mg  300 mg Oral Daily Merril Nagy, Remo Lipps, MD       cefTRIAXone (ROCEPHIN) 2 g in sodium chloride 0.9 % 100 mL IVPB  2 g Intravenous On Call to OR Michael Boston, MD       And   metroNIDAZOLE (FLAGYL) IVPB 500 mg  500 mg Intravenous On Call to OR Michael Boston,  MD  cefTRIAXone (ROCEPHIN) 2 g in sodium chloride 0.9 % 100 mL IVPB  2 g Intravenous Q24H Michael Boston, MD       And   metroNIDAZOLE (FLAGYL) IVPB 500 mg  500 mg Intravenous Tor Netters, MD       Chlorhexidine Gluconate Cloth 2 % PADS 6 each  6 each Topical Once Michael Boston, MD       And   Chlorhexidine Gluconate Cloth 2 % PADS 6 each  6 each Topical Once Michael Boston, MD       dexamethasone (DECADRON) injection 4 mg  4 mg Intravenous On Call to OR Michael Boston, MD       diphenhydrAMINE (BENADRYL) 12.5 MG/5ML elixir 12.5 mg  12.5 mg Oral Q6H PRN Michael Boston, MD       Or   diphenhydrAMINE (BENADRYL) injection 12.5 mg  12.5 mg Intravenous Q6H PRN Michael Boston, MD       enoxaparin (LOVENOX) injection 40 mg  40 mg Subcutaneous Q24H Michael Boston, MD       famotidine (PEPCID) tablet 20 mg  20 mg Oral BID Michael Boston, MD       fluticasone (FLONASE) 50 MCG/ACT nasal spray 2 spray  2 spray Each Nare Daily PRN Michael Boston, MD       gabapentin (NEURONTIN) capsule 300 mg  300 mg Oral On Call to OR Michael Boston, MD       hydrALAZINE (APRESOLINE) tablet 50 mg  50 mg Oral TID Michael Boston, MD       HYDROmorphone (DILAUDID) injection 0.5-2 mg  0.5-2 mg Intravenous Q2H PRN Michael Boston, MD       lactated ringers infusion   Intravenous Continuous Michael Boston, MD       loratadine (CLARITIN) tablet 10 mg  10 mg Oral Daily Michael Boston, MD       losartan (COZAAR) tablet 25 mg  25 mg Oral Daily Michael Boston, MD       metoprolol succinate (TOPROL-XL) 24 hr tablet 100 mg  100 mg Oral BID Michael Boston, MD       metoprolol tartrate (LOPRESSOR) injection 5 mg  5 mg Intravenous Q6H PRN Michael Boston, MD       morphine 4 MG/ML injection 4 mg  4 mg Intravenous Q2H PRN Molpus, John, MD   4 mg at 05/06/19 0354   ondansetron (ZOFRAN-ODT) disintegrating tablet 4 mg  4 mg Oral Q6H PRN Michael Boston, MD       Or   ondansetron [facility]) injection 4 mg  4 mg Intravenous Q6H PRN  Michael Boston, MD       pantoprazole (PROTONIX) EC tablet 40 mg  40 mg Oral Daily Michael Boston, MD       prochlorperazine (COMPAZINE) tablet 10 mg  10 mg Oral Q6H PRN Michael Boston, MD       Or   prochlorperazine (COMPAZINE) injection 5-10 mg  5-10 mg Intravenous Q6H PRN Michael Boston, MD       scopolamine (TRANSDERM-SCOP) 1 MG/3DAYS 1.5 mg  1 patch Transdermal On Call to OR Michael Boston, MD       simethicone [facility]) chewable tablet 40 mg  40 mg Oral Q6H PRN Michael Boston, MD       valACYclovir (VALTREX) tablet 1,000 mg  1,000 mg Oral Daily Michael Boston, MD       Current Outpatient Medications  Medication Sig Dispense Refill   Albuterol Sulfate 108 (90 Base) MCG/ACT AEPB Inhale 1-2 puffs into the lungs  every 6 (six) hours as needed (wheezing, sob). 1 each 2   atorvastatin (LIPITOR) 10 MG tablet Take 1 tablet (10 mg total) by mouth daily. 90 tablet 1   buPROPion (WELLBUTRIN XL) 300 MG 24 hr tablet Take 1 tablet (300 mg total) by mouth daily.     CALCIUM-MAGNESIUM-VITAMIN D PO Take 1 tablet by mouth 3 (three) times daily.     cetirizine (ZYRTEC) 10 MG tablet Take 1 tablet (10 mg total) by mouth 2 (two) times daily. (Patient taking differently: Take 10 mg by mouth daily as needed for allergies. ) 60 tablet 5   Cyanocobalamin (VITAMIN B12 PO) Take 2 tablets by mouth daily.      fexofenadine (ALLEGRA) 180 MG tablet Take 1 tablet (180 mg total) by mouth daily as needed for allergies or rhinitis. 30 tablet 2   fluticasone (FLONASE) 50 MCG/ACT nasal spray Place 2 sprays into both nostrils daily as needed for allergies or rhinitis. 16 g 5   hydrALAZINE (APRESOLINE) 50 MG tablet TAKE 1 TABLET THREE TIMES A DAY 270 tablet 1   losartan (COZAAR) 50 MG tablet Take 1 tablet (50 mg total) by mouth daily. (Patient taking differently: Take 25 mg by mouth daily. ) 30 tablet 5   metoprolol succinate (TOPROL XL) 100 MG 24 hr tablet Take 1 tablet (100 mg total) by mouth 2 (two) times daily. Take  with or immediately following a meal. 90 tablet 1   Multiple Vitamin (MULTI-VITAMINS) TABS Take 1 tablet by mouth daily.     omeprazole (PRILOSEC) 20 MG capsule Take 1 capsule (20 mg total) by mouth 2 (two) times daily before a meal. 180 capsule 1   polyethylene glycol (MIRALAX / GLYCOLAX) packet Take 17 g by mouth daily as needed for mild constipation.      QUEtiapine (SEROQUEL) 25 MG tablet Take 1 tablet (25 mg total) by mouth at bedtime. 90 tablet 0   traMADol (ULTRAM) 50 MG tablet Take 1 tablet (50 mg total) by mouth every 6 (six) hours as needed. (Patient taking differently: Take 50 mg by mouth every 6 (six) hours as needed for moderate pain. ) 20 tablet 0   triamcinolone ointment (KENALOG) 0.1 % Apply to red itchy areas only below the face 80 g 3   valACYclovir (VALTREX) 1000 MG tablet Take 1 tablet (1,000 mg total) by mouth daily. 30 tablet 5   acetaminophen (TYLENOL) 500 MG tablet Take 1,000 mg by mouth every 6 (six) hours as needed.     albuterol (VENTOLIN HFA) 108 (90 Base) MCG/ACT inhaler Inhale 2 puffs into the lungs every 4 (four) hours as needed for wheezing or shortness of breath. 18 g 1   doxycycline (VIBRAMYCIN) 100 MG capsule Take 1 capsule (100 mg total) by mouth 2 (two) times daily. 20 capsule 0   famotidine (PEPCID) 20 MG tablet Take 1 tablet (20 mg total) by mouth 2 (two) times daily. 60 tablet 5   ondansetron (ZOFRAN) 4 MG tablet Take 1 tablet (4 mg total) by mouth every 8 (eight) hours as needed for nausea or vomiting. 20 tablet 0   sildenafil (VIAGRA) 100 MG tablet TAKE 1/2 TO 1 TABLET BY MOUTH DAILY AS NEEDED 15 tablet 1   VYVANSE 20 MG capsule        Allergies  Allergen Reactions   Oxycodone Hives and Itching   Zoloft [Sertraline Hcl]     ?weight gain. "made me feel weird"    ROS:   All other systems reviewed &  are negative except per HPI or as noted below: Constitutional:  No fevers, chills, sweats.  Weight stable Eyes:  No vision changes, No  discharge HENT:  No sore throats, nasal drainage Lymph: No neck swelling, No bruising easily Pulmonary:  No cough, productive sputum CV: No orthopnea, PND  Patient walks 30 minutes for about 1 miles without difficulty.  No exertional chest/neck/shoulder/arm pain. GI:  No personal nor family history of GI/colon cancer, inflammatory bowel disease, irritable bowel syndrome, allergy such as Celiac Sprue, dietary/dairy problems, colitis, ulcers nor gastritis.  No recent sick contacts/gastroenteritis.  No travel outside the country.  No changes in diet. Renal: No UTIs, No hematuria Genital:  No drainage, bleeding, masses Musculoskeletal: No severe joint pain.  Good ROM major joints Skin:  No sores or lesions.  No rashes Heme/Lymph:  No easy bleeding.  No swollen lymph nodes Neuro: No focal weakness/numbness.  No seizures Psych: No suicidal ideation.  No hallucinations  BP (!) 180/108    Pulse 92    Temp 98 F (36.7 C) (Oral)    Resp 17    Ht 6\' 1"  (1.854 m)    Wt 133.8 kg    SpO2 99%    BMI 38.92 kg/m   Physical Exam: General: Pt awake/alert in mild major acute distress.  Tired but not particularly toxic Eyes: PERRL, normal EOM. Sclera nonicteric Neuro: CN II-XII intact w/o focal sensory/motor deficits. Lymph: No head/neck/groin lymphadenopathy Psych:  No delerium/psychosis/paranoia.  Oriented x4 HENT: Normocephalic, Mucus membranes moist.  No thrush.   Neck: Supple, No tracheal deviation.  No obvious thyromegaly Chest: No pain to chest wall compression.  Good respiratory excursion.  No audible wheezing CV:  Pulses intact.  Regular rhythm.  No major extremity edema Abdomen: Soft, Nondistended.  Tenderness to palpation in right flank and lower quadrant.  Slightly lateral to classic McBurney's point.  The rest of the abdomen is nontender.  Laparoscopic scars consistent with his prior laparoscopic gastric sleeve no incarcerated hernias. Gen:  No inguinal hernias.  No inguinal lymphadenopathy.    Ext: No obvious deformity or contracture no significant edema.  No cyanosis Skin: No petechiae / purpurea.  No major sores.  Warm and dry Musculoskeletal: No severe joint pain.  Good ROM major joints    Results:   Labs: Results for orders placed or performed during the hospital encounter of 05/05/19 (from the past 48 hour(s))  Lipase, blood     Status: None   Collection Time: 05/05/19 11:02 PM  Result Value Ref Range   Lipase 41 11 - 51 U/L    Comment: Performed at [facility], [address] [place], [place] [address]  Comprehensive metabolic panel     Status: Abnormal   Collection Time: 05/05/19 11:02 PM  Result Value Ref Range   Sodium 138 135 - 145 mmol/L   Potassium 3.8 3.5 - 5.1 mmol/L   Chloride 102 98 - 111 mmol/L   CO2 27 22 - 32 mmol/L   Glucose, Bld 93 70 - 99 mg/dL   BUN 9 6 - 20 mg/dL   Creatinine, Ser 1.13 0.61 - 1.24 mg/dL   Calcium 9.6 8.9 - 10.3 mg/dL   Total Protein 8.9 (H) 6.5 - 8.1 g/dL   Albumin 4.9 3.5 - 5.0 g/dL   AST 28 15 - 41 U/L   ALT 28 0 - 44 U/L   Alkaline Phosphatase 86 38 - 126 U/L   Total Bilirubin 1.3 (H) 0.3 - 1.2 mg/dL  GFR calc non Af Amer >60 >60 mL/min   GFR calc Af Amer >60 >60 mL/min   Anion gap 9 5 - 15    Comment: Performed at [facility], [address] [place], [place] [address]  CBC     Status: None   Collection Time: 05/05/19 11:02 PM  Result Value Ref Range   WBC 5.4 4.0 - 10.5 K/uL   RBC 5.05 4.22 - 5.81 MIL/uL   Hemoglobin 15.3 13.0 - 17.0 g/dL   HCT 46.8 39.0 - 52.0 %   MCV 92.7 80.0 - 100.0 fL   MCH 30.3 26.0 - 34.0 pg   MCHC 32.7 30.0 - 36.0 g/dL   RDW 13.7 11.5 - 15.5 %   Platelets 352 150 - 400 K/uL   nRBC 0.0 0.0 - 0.2 %    Comment: Performed at [facility], [address] [place], [place] [address]  Urinalysis, Routine w reflex microscopic     Status: None   Collection Time: 05/05/19 11:02 PM  Result Value Ref Range   Color, Urine YELLOW YELLOW   APPearance CLEAR  CLEAR   Specific Gravity, Urine 1.020 1.005 - 1.030   pH 7.0 5.0 - 8.0   Glucose, UA NEGATIVE NEGATIVE mg/dL   Hgb urine dipstick NEGATIVE NEGATIVE   Bilirubin Urine NEGATIVE NEGATIVE   Ketones, ur NEGATIVE NEGATIVE mg/dL   Protein, ur NEGATIVE NEGATIVE mg/dL   Nitrite NEGATIVE NEGATIVE   Leukocytes,Ua NEGATIVE NEGATIVE    Comment: Microscopic not done on urines with negative protein, blood, leukocytes, nitrite, or glucose < 500 mg/dL. Performed at [facility], [address] [place], [place] [address]     Imaging / Studies: CT Abdomen Pelvis Wo Contrast  Result Date: 04/29/2019 CLINICAL DATA:  Flank pain EXAM: CT ABDOMEN AND PELVIS WITHOUT CONTRAST TECHNIQUE: Multidetector CT imaging of the abdomen and pelvis was performed following the standard protocol without IV contrast. COMPARISON:  Radiograph 04/27/2019 FINDINGS: Lower chest: Lung bases demonstrate no acute consolidation or effusion. The heart size is normal. Hepatobiliary: No focal liver abnormality is seen. No gallstones, gallbladder wall thickening, or biliary dilatation. Pancreas: Unremarkable. No pancreatic ductal dilatation or surrounding inflammatory changes. Spleen: Normal in size without focal abnormality. Adrenals/Urinary Tract: Adrenal glands are unremarkable. Kidneys are normal, without renal calculi, focal lesion, or hydronephrosis. Bladder is unremarkable. Stomach/Bowel: Postsurgical changes of the stomach. No dilated small bowel. Negative appendix. No colon wall thickening. Vascular/Lymphatic: No significant vascular findings are present. No enlarged abdominal or pelvic lymph nodes. Reproductive: Prostate is unremarkable. Other: Negative for free air or free fluid Musculoskeletal: Small scattered sclerotic foci within the spine and pelvis, nonspecific IMPRESSION: 1. Negative for calcified gallstone. Negative for hydronephrosis or urinary tract calculus 2. No CT correlate for described opacity at L2. 3. Small  scattered foci of sclerosis within the spine and pelvis nonspecific, probable bone islands Electronically Signed   By: Donavan Foil M.D.   On: 04/29/2019 21:15   DG Lumbar Spine Complete  Result Date: 04/27/2019 CLINICAL DATA:  LEFT-sided back pain. EXAM: LUMBAR SPINE - COMPLETE 4+ VIEW COMPARISON:  None. FINDINGS: Normal alignment of lumbar vertebral bodies. No loss of vertebral body height or disc height. No pars fracture. No subluxation. IMPRESSION: Normal exam of the lumbar spine. Electronically Signed   By: Suzy Bouchard M.D.   On: 04/27/2019 15:34   CT ABDOMEN PELVIS W CONTRAST  Result Date: 05/06/2019 CLINICAL DATA:  Abdominal pain. Right lower quadrant  pain with nausea. History of gastroplasty. EXAM: CT ABDOMEN AND PELVIS WITH CONTRAST TECHNIQUE: Multidetector CT imaging of the abdomen and pelvis was performed using the standard protocol following bolus administration of intravenous contrast. CONTRAST:  18mL OMNIPAQUE IOHEXOL 300 MG/ML  SOLN COMPARISON:  CT 1 week ago, 04/29/2019. FINDINGS: Lower chest: Lung bases are clear. Hepatobiliary: No focal liver abnormality is seen. No gallstones, gallbladder wall thickening, or biliary dilatation. Pancreas: No ductal dilatation or inflammation. Spleen: Normal in size without focal abnormality. Adrenals/Urinary Tract: Thickening of the left adrenal gland without dominant nodule. Normal right adrenal gland. No hydronephrosis or perinephric edema. Homogeneous renal enhancement with symmetric excretion on delayed phase imaging. Urinary bladder is nondistended. Stomach/Bowel: Prior gastric sleeve resection. Lack of enteric contrast limits detailed gastric evaluation. No small bowel obstruction or inflammation. Mild thickening of the proximal appendix at 8 mm. Punctate high density in the distal appendix may represent an appendicular lith for enteric contrast from prior exam. This was seen on previous. Fat stranding adjacent to the appendiceal base and  posterolateral cecum. There is no associated cecal wall thickening. The terminal ileum is normal. Fat stranding is new from exam 1 week ago. Small to moderate volume of colonic stool. Vascular/Lymphatic: Multiple small retroperitoneal nodes not enlarged by size criteria. Abdominal aorta is normal in caliber. Portal vein is patent. Few prominent bilateral iliac nodes, unchanged. Reproductive: Prominent prostate gland spans 5.2 cm. Other: No free air, free fluid, or intra-abdominal fluid collection. Tiny fat containing umbilical hernia. Musculoskeletal: There are no acute or suspicious osseous abnormalities. Stable degenerative change in the lower lumbar spine. IMPRESSION: Mild thickening of the proximal appendix at 8 mm. Right lower quadrant fat stranding adjacent to the base of the appendix and posterolateral cecum. Findings are suspicious for early acute appendicitis, however mild focal cecal colitis or epiploic appendagitis are also considered. Regardless, no perforation or abscess. Electronically Signed   By: Keith Rake M.D.   On: 05/06/2019 00:39   DG Abd 2 Views  Result Date: 04/27/2019 CLINICAL DATA:  54 year old male with left-sided abdominal pain. EXAM: ABDOMEN - 2 VIEW COMPARISON:  Lumbar spine radiograph dated 04/27/2019. FINDINGS: Borderline dilated small bowel loops in the right hemiabdomen measures up to 3.1 cm in diameter and may be physiologic peristalsis or represent mild segmental ileus. This does not appear to persist the lumbar spine radiograph. Air is noted within the colon. No definite evidence of obstruction. There is moderate stool in the colon. No free air. Apparent 1 cm radiopaque focus adjacent to the right L2 inferior endplate. Faint radiopaque focus over the right upper quadrant may represent gallstone. Mild degenerative changes of the spine. No acute osseous pathology. The soft tissues are grossly unremarkable. IMPRESSION: 1. No evidence of bowel obstruction. 2. A 1 cm  radiopaque focus along the right L2 inferior endplate. Faint right upper quadrant stone may be present, possibly gallstone. Electronically Signed   By: Anner Crete M.D.   On: 04/27/2019 15:29    Medications / Allergies: per chart  Antibiotics: Anti-infectives (From admission, onward)   Start     Dose/Rate Route Frequency Ordered Stop   05/06/19 1000  valACYclovir (VALTREX) tablet 1,000 mg     1,000 mg Oral Daily 05/06/19 0404     05/06/19 0600  cefTRIAXone (ROCEPHIN) 2 g in sodium chloride 0.9 % 100 mL IVPB     2 g 200 mL/hr over 30 Minutes Intravenous On call to O.R. 05/06/19 0359 05/07/19 0559   05/06/19 0600  metroNIDAZOLE (FLAGYL)  IVPB 500 mg     500 mg 100 mL/hr over 60 Minutes Intravenous On call to O.R. 05/06/19 0359 05/07/19 0559   05/06/19 0415  cefTRIAXone (ROCEPHIN) 2 g in sodium chloride 0.9 % 100 mL IVPB     2 g 200 mL/hr over 30 Minutes Intravenous Every 24 hours 05/06/19 0402     05/06/19 0415  metroNIDAZOLE (FLAGYL) IVPB 500 mg     500 mg 100 mL/hr over 60 Minutes Intravenous Every 8 hours 05/06/19 0402     05/06/19 0130  cefTRIAXone (ROCEPHIN) 2 g in sodium chloride 0.9 % 100 mL IVPB     2 g 200 mL/hr over 30 Minutes Intravenous  Once 05/06/19 0121 05/06/19 0240   05/06/19 0130  metroNIDAZOLE (FLAGYL) IVPB 500 mg     500 mg 100 mL/hr over 60 Minutes Intravenous  Once 05/06/19 0122 05/06/19 0336        Note: Portions of this report may have been transcribed using voice recognition software. Every effort was made to ensure accuracy; however, inadvertent computerized transcription errors may be present.   Any transcriptional errors that result from this process are unintentional.    Charles Hector, MD, FACS, MASCRS Gastrointestinal and Minimally Invasive Surgery  [facility] [address] [place], [place] [address] [phone] Main / Paging [phone] Fax     05/06/2019

## 2019-05-06 NOTE — Anesthesia Procedure Notes (Signed)
Procedure Name: Intubation Date/Time: 05/06/2019 12:31 PM Performed by: Victoriano Lain, CRNA Pre-anesthesia Checklist: Patient identified, Emergency Drugs available, Suction available, Patient being monitored and Timeout performed Patient Re-evaluated:Patient Re-evaluated prior to induction Oxygen Delivery Method: Circle system utilized Preoxygenation: Pre-oxygenation with 100% oxygen Induction Type: IV induction, Rapid sequence and Cricoid Pressure applied Laryngoscope Size: Mac and 4 Grade View: Grade I Tube type: Oral Tube size: 7.5 mm Number of attempts: 1 Airway Equipment and Method: Stylet Placement Confirmation: ETT inserted through vocal cords under direct vision,  positive ETCO2 and breath sounds checked- equal and bilateral Secured at: 23 cm Tube secured with: Tape Dental Injury: Teeth and Oropharynx as per pre-operative assessment

## 2019-05-06 NOTE — ED Notes (Signed)
CHG bath completed  

## 2019-05-09 ENCOUNTER — Encounter: Payer: Self-pay | Admitting: *Deleted

## 2019-05-09 LAB — SURGICAL PATHOLOGY

## 2019-05-13 ENCOUNTER — Encounter: Admitting: Physical Medicine & Rehabilitation

## 2019-05-25 ENCOUNTER — Encounter: Payer: Self-pay | Admitting: Family

## 2019-05-25 NOTE — Telephone Encounter (Signed)
See previous mychart message from 2/10. Pt was advise OV.

## 2019-05-26 ENCOUNTER — Other Ambulatory Visit: Payer: Self-pay

## 2019-05-26 ENCOUNTER — Ambulatory Visit (INDEPENDENT_AMBULATORY_CARE_PROVIDER_SITE_OTHER): Admitting: Family Medicine

## 2019-05-26 ENCOUNTER — Encounter: Payer: Self-pay | Admitting: Family Medicine

## 2019-05-26 VITALS — BP 152/100 | HR 86 | Temp 97.9°F | Resp 18 | Ht 73.0 in | Wt 287.2 lb

## 2019-05-26 DIAGNOSIS — Z9049 Acquired absence of other specified parts of digestive tract: Secondary | ICD-10-CM | POA: Diagnosis not present

## 2019-05-26 DIAGNOSIS — I10 Essential (primary) hypertension: Secondary | ICD-10-CM | POA: Diagnosis not present

## 2019-05-26 MED ORDER — AMLODIPINE BESYLATE 5 MG PO TABS: 5.0000 mg | ORAL_TABLET | Freq: Every day | ORAL | 1 refills | Status: DC

## 2019-05-26 MED ORDER — LOSARTAN POTASSIUM 50 MG PO TABS: ORAL_TABLET | ORAL | 1 refills | Status: DC

## 2019-05-26 NOTE — Progress Notes (Signed)
Patient ID: Charles Crudele., male    DOB: December 18, 1965  Age: 54 y.o. MRN: [id number]    Subjective:  Subjective  HPI Charles Nash. presents for f/u bp   He recently had lap AP.   No complaints   Review of Systems  Constitutional: Negative.   HENT: Negative for congestion, ear pain, hearing loss, nosebleeds, postnasal drip, rhinorrhea, sinus pressure, sneezing and tinnitus.   Eyes: Negative for photophobia, discharge, itching and visual disturbance.  Respiratory: Negative.   Cardiovascular: Negative.   Gastrointestinal: Negative for abdominal distention, abdominal pain, anal bleeding, blood in stool and constipation.  Endocrine: Negative.   Genitourinary: Negative.   Musculoskeletal: Negative.   Skin: Negative.   Allergic/Immunologic: Negative.   Neurological: Negative for dizziness, weakness, light-headedness, numbness and headaches.  Psychiatric/Behavioral: Negative for agitation, confusion, decreased concentration, dysphoric mood, sleep disturbance and suicidal ideas. The patient is not nervous/anxious.     History Past Medical History:  Diagnosis Date  . ADHD (attention deficit hyperactivity disorder)   . Allergy   . Arthritis   . Bilateral varicoceles   . Depression   . Fatty liver 08/06/2011  . Genital herpes 10/25/2014  . GERD (gastroesophageal reflux disease)   . Hyperlipidemia   . Hypertension   . OSA (obstructive sleep apnea)   . Personal history of colonic polyps   . Plantar fasciitis   . 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   His family history includes Arthritis in his father and mother; Cancer in his father and mother; Diabetes in his mother; Heart attack in  his paternal grandfather; Hyperlipidemia in his father, maternal grandfather, maternal grandmother, paternal grandfather, and paternal grandmother; Hypertension in his maternal grandfather, maternal grandmother, mother, paternal grandfather, and paternal grandmother; Other in his father.He reports that he has never smoked. He has never used smokeless tobacco. He reports that he does not drink alcohol or use drugs.  Current Outpatient Medications on File Prior to Visit  Medication Sig Dispense Refill  . acetaminophen (TYLENOL) 500 MG tablet Take 1,000 mg by mouth every 6 (six) hours as needed for mild pain.     . Albuterol Sulfate 108 (90 Base) MCG/ACT AEPB Inhale 1-2 puffs into the lungs every 6 (six) hours as needed (wheezing, sob). 1 each 2  . atorvastatin (LIPITOR) 10 MG tablet Take 1 tablet (10 mg total) by mouth daily. 90 tablet 1  . buPROPion (WELLBUTRIN XL) 300 MG 24 hr tablet Take 1 tablet (300 mg total) by mouth daily.    Marland Kitchen CALCIUM-MAGNESIUM-VITAMIN D PO Take 1 tablet by mouth 3 (three) times daily.    . cetirizine (ZYRTEC) 10 MG tablet Take 1 tablet (10 mg total) by mouth 2 (two) times daily. (Patient taking differently: Take 10 mg by mouth daily as needed for allergies. ) 60 tablet 5  . Cyanocobalamin (VITAMIN B12 PO) Take 2 tablets by mouth daily.     . fexofenadine (ALLEGRA) 180 MG tablet Take 1 tablet (180 mg total) by mouth daily as needed for allergies or rhinitis. 30 tablet 2  . fluticasone (FLONASE) 50 MCG/ACT nasal spray Place 2 sprays into both nostrils daily as needed for allergies or rhinitis. 16 g 5  . hydrALAZINE (APRESOLINE) 50 MG tablet TAKE 1 TABLET THREE TIMES A DAY 270 tablet  1  . metoprolol succinate (TOPROL XL) 100 MG 24 hr tablet Take 1 tablet (100 mg total) by mouth 2 (two) times daily. Take with or immediately following a meal. 90 tablet 1  . Multiple Vitamin (MULTI-VITAMINS) TABS Take 1 tablet by mouth daily.    Marland Kitchen omeprazole (PRILOSEC) 20 MG capsule Take 1 capsule  (20 mg total) by mouth 2 (two) times daily before a meal. 180 capsule 1  . ondansetron (ZOFRAN) 4 MG tablet Take 1 tablet (4 mg total) by mouth every 8 (eight) hours as needed for nausea or vomiting. 20 tablet 0  . ondansetron (ZOFRAN-ODT) 4 MG disintegrating tablet Take 1 tablet (4 mg total) by mouth every 6 (six) hours as needed for nausea. 20 tablet 0  . polyethylene glycol (MIRALAX / GLYCOLAX) packet Take 17 g by mouth daily as needed for mild constipation.     . QUEtiapine (SEROQUEL) 25 MG tablet Take 1 tablet (25 mg total) by mouth at bedtime. 90 tablet 0  . SAXENDA 18 MG/3ML SOPN     . sildenafil (VIAGRA) 100 MG tablet TAKE 1/2 TO 1 TABLET BY MOUTH DAILY AS NEEDED 15 tablet 1  . traMADol (ULTRAM) 50 MG tablet Take 1 tablet (50 mg total) by mouth every 6 (six) hours as needed. (Patient taking differently: Take 50 mg by mouth every 6 (six) hours as needed for moderate pain. ) 20 tablet 0  . triamcinolone ointment (KENALOG) 0.1 % Apply to red itchy areas only below the face 80 g 3  . valACYclovir (VALTREX) 1000 MG tablet Take 1 tablet (1,000 mg total) by mouth daily. 30 tablet 5  . VYVANSE 20 MG capsule Take 20 mg by mouth daily.     . [DISCONTINUED] potassium chloride (K-DUR) 10 MEQ tablet Take 1 tablet (10 mEq total) by mouth daily. 30 tablet 1   No current facility-administered medications on file prior to visit.     Objective:  Objective  Physical Exam Vitals and nursing note reviewed.  Constitutional:      General: He is sleeping.     Appearance: He is well-developed.  HENT:     Head: Normocephalic and atraumatic.  Eyes:     Pupils: Pupils are equal, round, and reactive to light.  Neck:     Thyroid: No thyromegaly.  Cardiovascular:     Rate and Rhythm: Normal rate and regular rhythm.     Heart sounds: No murmur.  Pulmonary:     Effort: Pulmonary effort is normal. No respiratory distress.     Breath sounds: Normal breath sounds. No wheezing or rales.  Chest:     Chest  wall: No tenderness.  Musculoskeletal:        General: No tenderness.     Cervical back: Normal range of motion and neck supple.  Skin:    General: Skin is warm and dry.  Neurological:     Mental Status: He is oriented to person, place, and time.  Psychiatric:        Behavior: Behavior normal.        Thought Content: Thought content normal.        Judgment: Judgment normal.    BP (!) 152/100 (BP Location: Left Arm, Patient Position: Sitting, Cuff Size: Large)   Pulse 86   Temp 97.9 F (36.6 C) (Temporal)   Resp 18   Ht 6\' 1"  (1.854 m)   Wt 287 lb 3.2 oz (130.3 kg)   SpO2 99%   BMI 37.89 kg/m  Wt  Readings from Last 3 Encounters:  05/26/19 287 lb 3.2 oz (130.3 kg)  05/05/19 295 lb (133.8 kg)  04/22/19 300 lb (136.1 kg)     Lab Results  Component Value Date   WBC 5.4 05/05/2019   HGB 15.3 05/05/2019   HCT 46.8 05/05/2019   PLT 352 05/05/2019   GLUCOSE 93 05/05/2019   CHOL 175 06/11/2018   TRIG 100.0 06/11/2018   HDL 56.80 06/11/2018   LDLCALC 98 06/11/2018   ALT 28 05/05/2019   AST 28 05/05/2019   NA 138 05/05/2019   K 3.8 05/05/2019   CL 102 05/05/2019   CREATININE 1.13 05/05/2019   BUN 9 05/05/2019   CO2 27 05/05/2019   TSH 1.42 04/03/2016   PSA 1.32 04/03/2016   HGBA1C 6.4 02/07/2019   MICROALBUR 2.3 (H) 01/26/2015    CT ABDOMEN PELVIS W CONTRAST  Result Date: 05/06/2019 CLINICAL DATA:  Abdominal pain. Right lower quadrant pain with nausea. History of gastroplasty. EXAM: CT ABDOMEN AND PELVIS WITH CONTRAST TECHNIQUE: Multidetector CT imaging of the abdomen and pelvis was performed using the standard protocol following bolus administration of intravenous contrast. CONTRAST:  19mL OMNIPAQUE IOHEXOL 300 MG/ML  SOLN COMPARISON:  CT 1 week ago, 04/29/2019. FINDINGS: Lower chest: Lung bases are clear. Hepatobiliary: No focal liver abnormality is seen. No gallstones, gallbladder wall thickening, or biliary dilatation. Pancreas: No ductal dilatation or inflammation.  Spleen: Normal in size without focal abnormality. Adrenals/Urinary Tract: Thickening of the left adrenal gland without dominant nodule. Normal right adrenal gland. No hydronephrosis or perinephric edema. Homogeneous renal enhancement with symmetric excretion on delayed phase imaging. Urinary bladder is nondistended. Stomach/Bowel: Prior gastric sleeve resection. Lack of enteric contrast limits detailed gastric evaluation. No small bowel obstruction or inflammation. Mild thickening of the proximal appendix at 8 mm. Punctate high density in the distal appendix may represent an appendicular lith for enteric contrast from prior exam. This was seen on previous. Fat stranding adjacent to the appendiceal base and posterolateral cecum. There is no associated cecal wall thickening. The terminal ileum is normal. Fat stranding is new from exam 1 week ago. Small to moderate volume of colonic stool. Vascular/Lymphatic: Multiple small retroperitoneal nodes not enlarged by size criteria. Abdominal aorta is normal in caliber. Portal vein is patent. Few prominent bilateral iliac nodes, unchanged. Reproductive: Prominent prostate gland spans 5.2 cm. Other: No free air, free fluid, or intra-abdominal fluid collection. Tiny fat containing umbilical hernia. Musculoskeletal: There are no acute or suspicious osseous abnormalities. Stable degenerative change in the lower lumbar spine. IMPRESSION: Mild thickening of the proximal appendix at 8 mm. Right lower quadrant fat stranding adjacent to the base of the appendix and posterolateral cecum. Findings are suspicious for early acute appendicitis, however mild focal cecal colitis or epiploic appendagitis are also considered. Regardless, no perforation or abscess. Electronically Signed   By: Keith Rake M.D.   On: 05/06/2019 00:39     Assessment & Plan:  Plan  I have changed Charles Maxim Jr.'s losartan. I am also having him maintain his polyethylene glycol, fexofenadine,  Multi-Vitamins, CALCIUM-MAGNESIUM-VITAMIN D PO, buPROPion, sildenafil, Vyvanse, QUEtiapine, hydrALAZINE, fluticasone, cetirizine, Albuterol Sulfate, omeprazole, atorvastatin, metoprolol succinate, Cyanocobalamin (VITAMIN B12 PO), triamcinolone ointment, valACYclovir, acetaminophen, ondansetron, traMADol, ondansetron, Saxenda, and amLODipine.  Meds ordered this encounter  Medications  . amLODipine (NORVASC) 5 MG tablet    Sig: Take 1 tablet (5 mg total) by mouth daily.    Dispense:  90 tablet    Refill:  1  . losartan (COZAAR)  50 MG tablet    Sig: 1 po qd    Dispense:  90 tablet    Refill:  1    Problem List Items Addressed This Visit      Unprioritized   HTN (hypertension) - Primary    Poorly controlled will alter medications, encouraged DASH diet, minimize caffeine and obtain adequate sleep. Report concerning symptoms and follow up as directed and as needed Increase losartan to 50 mg daily con't metoprolol, norvasc and hydralazine  F/u 2-3 weeks       Relevant Medications   amLODipine (NORVASC) 5 MG tablet   losartan (COZAAR) 50 MG tablet   S/P laparoscopic appendectomy    Wound healing well          Follow-up: Return in about 3 weeks (around 06/16/2019), or if symptoms worsen or fail to improve, for hypertension.  Ann Held, DO

## 2019-05-26 NOTE — Telephone Encounter (Signed)
Was seen by Dr. Etter Sjogren today

## 2019-05-26 NOTE — Assessment & Plan Note (Addendum)
Poorly controlled will alter medications, encouraged DASH diet, minimize caffeine and obtain adequate sleep. Report concerning symptoms and follow up as directed and as needed Increase losartan to 50 mg daily con't metoprolol, norvasc and hydralazine  F/u 2-3 weeks

## 2019-05-26 NOTE — Patient Instructions (Addendum)
COVID-19 Vaccine Information can be found at: ShippingScam.co.uk For questions related to vaccine distribution or appointments, please email [email or web address] or call [phone].       DASH Eating Plan DASH stands for "Dietary Approaches to Stop Hypertension." The DASH eating plan is a healthy eating plan that has been shown to reduce high blood pressure (hypertension). It may also reduce your risk for type 2 diabetes, heart disease, and stroke. The DASH eating plan may also help with weight loss. What are tips for following this plan?  General guidelines  Avoid eating more than 2,300 mg (milligrams) of salt (sodium) a day. If you have hypertension, you may need to reduce your sodium intake to 1,500 mg a day.  Limit alcohol intake to no more than 1 drink a day for nonpregnant women and 2 drinks a day for men. One drink equals 12 oz of beer, 5 oz of wine, or 1 oz of hard liquor.  Work with your health care provider to maintain a healthy body weight or to lose weight. Ask what an ideal weight is for you.  Get at least 30 minutes of exercise that causes your heart to beat faster (aerobic exercise) most days of the week. Activities may include walking, swimming, or biking.  Work with your health care provider or diet and nutrition specialist (dietitian) to adjust your eating plan to your individual calorie needs. Reading food labels   Check food labels for the amount of sodium per serving. Choose foods with less than 5 percent of the Daily Value of sodium. Generally, foods with less than 300 mg of sodium per serving fit into this eating plan.  To find whole grains, look for the word "whole" as the first word in the ingredient list. Shopping  Buy products labeled as "low-sodium" or "no salt added."  Buy fresh foods. Avoid canned foods and premade or frozen meals. Cooking  Avoid adding salt when cooking. Use salt-free  seasonings or herbs instead of table salt or sea salt. Check with your health care provider or pharmacist before using salt substitutes.  Do not fry foods. Cook foods using healthy methods such as baking, boiling, grilling, and broiling instead.  Cook with heart-healthy oils, such as olive, canola, soybean, or sunflower oil. Meal planning  Eat a balanced diet that includes: ? 5 or more servings of fruits and vegetables each day. At each meal, try to fill half of your plate with fruits and vegetables. ? Up to 6-8 servings of whole grains each day. ? Less than 6 oz of lean meat, poultry, or fish each day. A 3-oz serving of meat is about the same size as a deck of cards. One egg equals 1 oz. ? 2 servings of low-fat dairy each day. ? A serving of nuts, seeds, or beans 5 times each week. ? Heart-healthy fats. Healthy fats called Omega-3 fatty acids are found in foods such as flaxseeds and coldwater fish, like sardines, salmon, and mackerel.  Limit how much you eat of the following: ? Canned or prepackaged foods. ? Food that is high in trans fat, such as fried foods. ? Food that is high in saturated fat, such as fatty meat. ? Sweets, desserts, sugary drinks, and other foods with added sugar. ? Full-fat dairy products.  Do not salt foods before eating.  Try to eat at least 2 vegetarian meals each week.  Eat more home-cooked food and less restaurant, buffet, and fast food.  When eating at a restaurant, ask that your food  be prepared with less salt or no salt, if possible. What foods are recommended? The items listed may not be a complete list. Talk with your dietitian about what dietary choices are best for you. Grains Whole-grain or whole-wheat bread. Whole-grain or whole-wheat pasta. Brown rice. [place]. Bulgur. Whole-grain and low-sodium cereals. Pita bread. Low-fat, low-sodium crackers. Whole-wheat flour tortillas. Vegetables Fresh or frozen vegetables (raw, steamed, roasted, or  grilled). Low-sodium or reduced-sodium tomato and vegetable juice. Low-sodium or reduced-sodium tomato sauce and tomato paste. Low-sodium or reduced-sodium canned vegetables. Fruits All fresh, dried, or frozen fruit. Canned fruit in natural juice (without added sugar). Meat and other protein foods Skinless chicken or [place]. Ground chicken or [place]. Pork with fat trimmed off. Fish and seafood. Egg whites. Dried beans, peas, or lentils. Unsalted nuts, nut butters, and seeds. Unsalted canned beans. Lean cuts of beef with fat trimmed off. Low-sodium, lean deli meat. Dairy Low-fat (1%) or fat-free (skim) milk. Fat-free, low-fat, or reduced-fat cheeses. Nonfat, low-sodium ricotta or cottage cheese. Low-fat or nonfat yogurt. Low-fat, low-sodium cheese. Fats and oils Soft margarine without trans fats. Vegetable oil. Low-fat, reduced-fat, or light mayonnaise and salad dressings (reduced-sodium). Canola, safflower, olive, soybean, and sunflower oils. Avocado. Seasoning and other foods Herbs. Spices. Seasoning mixes without salt. Unsalted popcorn and pretzels. Fat-free sweets. What foods are not recommended? The items listed may not be a complete list. Talk with your dietitian about what dietary choices are best for you. Grains Baked goods made with fat, such as croissants, muffins, or some breads. Dry pasta or rice meal packs. Vegetables Creamed or fried vegetables. Vegetables in a cheese sauce. Regular canned vegetables (not low-sodium or reduced-sodium). Regular canned tomato sauce and paste (not low-sodium or reduced-sodium). Regular tomato and vegetable juice (not low-sodium or reduced-sodium). Angie Fava. Olives. Fruits Canned fruit in a light or heavy syrup. Fried fruit. Fruit in cream or butter sauce. Meat and other protein foods Fatty cuts of meat. Ribs. Fried meat. Berniece Salines. Sausage. Bologna and other processed lunch meats. Salami. Fatback. Hotdogs. Bratwurst. Salted nuts and seeds. Canned beans with  added salt. Canned or smoked fish. Whole eggs or egg yolks. Chicken or [place] with skin. Dairy Whole or 2% milk, cream, and half-and-half. Whole or full-fat cream cheese. Whole-fat or sweetened yogurt. Full-fat cheese. Nondairy creamers. Whipped toppings. Processed cheese and cheese spreads. Fats and oils Butter. Stick margarine. Lard. Shortening. Ghee. Bacon fat. Tropical oils, such as coconut, palm kernel, or palm oil. Seasoning and other foods Salted popcorn and pretzels. Onion salt, garlic salt, seasoned salt, table salt, and sea salt. Worcestershire sauce. Tartar sauce. Barbecue sauce. Teriyaki sauce. Soy sauce, including reduced-sodium. Steak sauce. Canned and packaged gravies. Fish sauce. Oyster sauce. Cocktail sauce. Horseradish that you find on the shelf. Ketchup. Mustard. Meat flavorings and tenderizers. Bouillon cubes. Hot sauce and Tabasco sauce. Premade or packaged marinades. Premade or packaged taco seasonings. Relishes. Regular salad dressings. Where to find more information:  National Heart, Lung, and [place]: https://wilson-eaton.com/  American Heart Association: www.heart.org Summary  The DASH eating plan is a healthy eating plan that has been shown to reduce high blood pressure (hypertension). It may also reduce your risk for type 2 diabetes, heart disease, and stroke.  With the DASH eating plan, you should limit salt (sodium) intake to 2,300 mg a day. If you have hypertension, you may need to reduce your sodium intake to 1,500 mg a day.  When on the DASH eating plan, aim to eat more fresh fruits and vegetables, whole  grains, lean proteins, low-fat dairy, and heart-healthy fats.  Work with your health care provider or diet and nutrition specialist (dietitian) to adjust your eating plan to your individual calorie needs. This information is not intended to replace advice given to you by your health care provider. Make sure you discuss any questions you have with your health care  provider. Document Revised: 03/13/2017 Document Reviewed: 03/24/2016 Elsevier Patient Education  2020 [employer].

## 2019-05-31 DIAGNOSIS — Z9049 Acquired absence of other specified parts of digestive tract: Secondary | ICD-10-CM | POA: Insufficient documentation

## 2019-05-31 NOTE — Assessment & Plan Note (Signed)
Wound healing well 

## 2019-06-07 ENCOUNTER — Ambulatory Visit: Admitting: Family

## 2019-06-07 DIAGNOSIS — Z0289 Encounter for other administrative examinations: Secondary | ICD-10-CM

## 2019-06-10 ENCOUNTER — Encounter: Admitting: Physical Medicine & Rehabilitation

## 2019-06-23 ENCOUNTER — Encounter: Payer: Self-pay | Admitting: Physical Medicine & Rehabilitation

## 2019-06-23 ENCOUNTER — Other Ambulatory Visit: Payer: Self-pay

## 2019-06-23 ENCOUNTER — Encounter: Attending: Physical Medicine & Rehabilitation | Admitting: Physical Medicine & Rehabilitation

## 2019-06-23 VITALS — BP 159/97 | HR 84 | Temp 97.5°F | Ht 73.0 in | Wt 287.0 lb

## 2019-06-23 DIAGNOSIS — M47816 Spondylosis without myelopathy or radiculopathy, lumbar region: Secondary | ICD-10-CM | POA: Diagnosis not present

## 2019-06-23 DIAGNOSIS — M5416 Radiculopathy, lumbar region: Secondary | ICD-10-CM | POA: Diagnosis present

## 2019-06-23 NOTE — Progress Notes (Signed)
  PROCEDURE RECORD [facility]   Name: Charles Nash. DOB:February 19, 1966 MRN: [id number]  Date:06/23/2019  Physician: Alysia Penna, MD    Nurse/CMA: Darianne Muralles, CMA   Allergies:  Allergies  Allergen Reactions  . Oxycodone Hives and Itching  . Zoloft [Sertraline Hcl]     ?weight gain. "made me feel weird"    Consent Signed: Yes.    Is patient diabetic? No.  CBG today?   Pregnant: No. LMP: No LMP for male patient. (age 45-55)  Anticoagulants: no Anti-inflammatory: no Antibiotics: no  Procedure: left L3,4,5 medial branch block  Position: Prone Start Time: 11:36am  End Time: 11:41am  Fluoro Time: 24s  RN/CMA Anushri Casalino, CMA Myers Tutterow, CMA    Time 11:18am 11:47am    BP 159/97 157/94    Pulse 84 77    Respirations 14 14    O2 Sat 96 96    S/S 6 6    4/10 4/10      D/C home with son, patient A & O X 3, D/C instructions reviewed, and sits independently.

## 2019-06-23 NOTE — Progress Notes (Signed)
Left Lumbar L3, L4  medial branch blocks and L 5 dorsal ramus injection under fluoroscopic guidance   Indication: Left Lumbar pain which is not relieved by medication management or other conservative care and interfering with self-care and mobility.  Informed consent was obtained after describing risks and benefits of the procedure with the patient, this includes bleeding, bruising, infection, paralysis and medication side effects.  The patient wishes to proceed and has given written consent.  The patient was placed in a prone position.  The lumbar area was marked and prepped with Betadine.  One mL of 1% lidocaine was injected into each of 3 areas into the skin and subcutaneous tissue.  Then a 22-gauge 5in spinal needle was inserted targeting the junction of the left S1 superior articular process and sacral ala junction.  Needle was advanced under fluoroscopic guidance.  Bone contact was made. Isovue 200 was injected x 0.5 mL demonstrating no intravascular uptake.  Then a solution containing  2% MPF lidocaine was injected x 0.5 mL.  Then the left L5 superior articular process in transverse process junction was targeted.  Bone contact was made. Isovue 200 was injected x 0.5 mL demonstrating no intravascular uptake.  Then a solution containing 2% MPF lidocaine was injected x 0.5 mL.  Then the left L4 superior articular process in transverse process junction was targeted.  Bone contact was made.  Isovue 200 was injected x 0.5 mL demonstrating no intravascular uptake.  Then a solution containing  2% MPF lidocaine was injected x 0.5 mL.  Patient tolerated procedure well.  Post procedure instructions were given.  

## 2019-06-23 NOTE — Patient Instructions (Signed)
Lumbar medial branch blocks were performed Left L3-4-5 . This is to help diagnose the cause of the low back pain. It is important that you keep track of your pain for the first day or 2 after injection. This injection can give you temporary relief that lasts for hours or up to several months. There is no way to predict duration of pain relief.  Please try to compare your pain after injection to for the injection.  If this injection gives you  temporary relief there may be another longer-lasting procedure that may be beneficial call radiofrequency ablation

## 2019-07-04 ENCOUNTER — Encounter: Payer: Self-pay | Admitting: Family

## 2019-07-04 ENCOUNTER — Ambulatory Visit (INDEPENDENT_AMBULATORY_CARE_PROVIDER_SITE_OTHER): Admitting: Family

## 2019-07-04 VITALS — BP 148/88 | HR 81

## 2019-07-04 DIAGNOSIS — R519 Headache, unspecified: Secondary | ICD-10-CM

## 2019-07-04 MED ORDER — SUMATRIPTAN SUCCINATE 50 MG PO TABS: ORAL_TABLET | ORAL | 0 refills | Status: DC

## 2019-07-04 NOTE — Progress Notes (Signed)
Virtual Visit via Video Note  I connected with Charles Nash. on 07/04/19 at 10:40 AM EDT by a video enabled telemedicine application and verified that I am speaking with the correct person using two identifiers.  Location: Patient: home Provider: home   I discussed the limitations of evaluation and management by telemedicine and the availability of in person appointments. The patient expressed understanding and agreed to proceed.  History of Present Illness:  Patient is a 54 yr old male who presents today with chief complaint of scalp pain. Pain is located on the left side of his head. Reports area is tender to the touch. Denies HA, swelling,  skin rash, blisters, vision changes or nausea.   Past Medical History:  Diagnosis Date  . ADHD (attention deficit hyperactivity disorder)   . Allergy   . Arthritis   . Bilateral varicoceles   . Depression   . Fatty liver 08/06/2011  . Genital herpes 10/25/2014  . GERD (gastroesophageal reflux disease)   . Hyperlipidemia   . Hypertension   . OSA (obstructive sleep apnea)   . Personal history of colonic polyps   . Plantar fasciitis   . PTSD (post-traumatic stress disorder)      Social History   Socioeconomic History  . Marital status: Divorced    Spouse name: Not on file  . Number of children: 2  . Years of education: Not on file  . Highest education level: Not on file  Occupational History  . Occupation: Astronomer: [employer]  Tobacco Use  . Smoking status: Never Smoker  . Smokeless tobacco: Never Used  Substance and Sexual Activity  . Alcohol use: No    Alcohol/week: 0.0 standard drinks  . Drug use: No  . Sexual activity: Not on file  Other Topics Concern  . Not on file  Social History Narrative   Regular exercise:  No   Caffeine use:  2--32oz cups daily      Social Determinants of Health   Financial Resource Strain:   . Difficulty of Paying Living Expenses:   Food Insecurity:   .  Worried About Charity fundraiser in the Last Year:   . Arboriculturist in the Last Year:   Transportation Needs:   . Film/video editor (Medical):   Marland Kitchen Lack of Transportation (Non-Medical):   Physical Activity:   . Days of Exercise per Week:   . Minutes of Exercise per Session:   Stress:   . Feeling of Stress :   Social Connections:   . Frequency of Communication with Friends and Family:   . Frequency of Social Gatherings with Friends and Family:   . Attends Religious Services:   . Active Member of Clubs or Organizations:   . Attends Archivist Meetings:   Marland Kitchen Marital Status:   Intimate Partner Violence:   . Fear of Current or Ex-Partner:   . Emotionally Abused:   Marland Kitchen Physically Abused:   . Sexually Abused:     Past Surgical History:  Procedure Laterality Date  . ANKLE SURGERY    . DOPPLER ECHOCARDIOGRAPHY  2010  . FOOT SURGERY    . KNEE SURGERY  08/04/08   Right knee-- medial meniscus repair, attenuation anterior cruciate [name] MD [facility])   . LAPAROSCOPIC APPENDECTOMY N/A 05/06/2019   Procedure: APPENDECTOMY LAPAROSCOPIC;  Surgeon: Clovis Riley, MD;  Location: [facility];  Service: General;  Laterality: N/A;  . NM MYOVIEW LTD  2010  . PLANTAR FASCIA RELEASE    . SHOULDER SURGERY    . SLEEVE GASTROPLASTY  10/29/2016  . VARICOCELE EXCISION      Family History  Problem Relation Age of Onset  . Diabetes Mother   . Hypertension Mother   . Arthritis Mother   . Cancer Mother        uterine and breast cancer, sarcoma of the brain  . Hyperlipidemia Father   . Arthritis Father   . Cancer Father        thyroid, prostate cancer  . Other Father        mass in stomach  . Hyperlipidemia Maternal Grandmother   . Hypertension Maternal Grandmother   . Hyperlipidemia Maternal Grandfather   . Hypertension Maternal Grandfather   . Hyperlipidemia Paternal Grandmother   . Hypertension Paternal Grandmother   . Hyperlipidemia Paternal Grandfather   .  Hypertension Paternal Grandfather   . Heart attack Paternal Grandfather   . Sudden death Neg Hx   . Colon cancer Neg Hx   . Esophageal cancer Neg Hx   . Stomach cancer Neg Hx   . Rectal cancer Neg Hx     Allergies  Allergen Reactions  . Oxycodone Hives and Itching  . Zoloft [Sertraline Hcl]     ?weight gain. "made me feel weird"    Current Outpatient Medications on File Prior to Visit  Medication Sig Dispense Refill  . acetaminophen (TYLENOL) 500 MG tablet Take 1,000 mg by mouth every 6 (six) hours as needed for mild pain.     . Albuterol Sulfate 108 (90 Base) MCG/ACT AEPB Inhale 1-2 puffs into the lungs every 6 (six) hours as needed (wheezing, sob). 1 each 2  . amLODipine (NORVASC) 5 MG tablet Take 1 tablet (5 mg total) by mouth daily. 90 tablet 1  . atorvastatin (LIPITOR) 10 MG tablet Take 1 tablet (10 mg total) by mouth daily. 90 tablet 1  . buPROPion (WELLBUTRIN XL) 300 MG 24 hr tablet Take 1 tablet (300 mg total) by mouth daily.    Marland Kitchen CALCIUM-MAGNESIUM-VITAMIN D PO Take 1 tablet by mouth 3 (three) times daily.    . cetirizine (ZYRTEC) 10 MG tablet Take 1 tablet (10 mg total) by mouth 2 (two) times daily. (Patient taking differently: Take 10 mg by mouth daily as needed for allergies. ) 60 tablet 5  . Cyanocobalamin (VITAMIN B12 PO) Take 2 tablets by mouth daily.     . fexofenadine (ALLEGRA) 180 MG tablet Take 1 tablet (180 mg total) by mouth daily as needed for allergies or rhinitis. 30 tablet 2  . fluticasone (FLONASE) 50 MCG/ACT nasal spray Place 2 sprays into both nostrils daily as needed for allergies or rhinitis. 16 g 5  . hydrALAZINE (APRESOLINE) 50 MG tablet TAKE 1 TABLET THREE TIMES A DAY 270 tablet 1  . losartan (COZAAR) 50 MG tablet 1 po qd 90 tablet 1  . metoprolol succinate (TOPROL XL) 100 MG 24 hr tablet Take 1 tablet (100 mg total) by mouth 2 (two) times daily. Take with or immediately following a meal. 90 tablet 1  . Multiple Vitamin (MULTI-VITAMINS) TABS Take 1  tablet by mouth daily.    Marland Kitchen omeprazole (PRILOSEC) 20 MG capsule Take 1 capsule (20 mg total) by mouth 2 (two) times daily before a meal. 180 capsule 1  . ondansetron (ZOFRAN-ODT) 4 MG disintegrating tablet Take 1 tablet (4 mg total) by mouth every 6 (six) hours as needed for nausea. 20 tablet 0  .  polyethylene glycol (MIRALAX / GLYCOLAX) packet Take 17 g by mouth daily as needed for mild constipation.     . QUEtiapine (SEROQUEL) 25 MG tablet Take 1 tablet (25 mg total) by mouth at bedtime. 90 tablet 0  . SAXENDA 18 MG/3ML SOPN     . sildenafil (VIAGRA) 100 MG tablet TAKE 1/2 TO 1 TABLET BY MOUTH DAILY AS NEEDED 15 tablet 1  . traMADol (ULTRAM) 50 MG tablet Take 1 tablet (50 mg total) by mouth every 6 (six) hours as needed. (Patient taking differently: Take 50 mg by mouth every 6 (six) hours as needed for moderate pain. ) 20 tablet 0  . triamcinolone ointment (KENALOG) 0.1 % Apply to red itchy areas only below the face 80 g 3  . valACYclovir (VALTREX) 1000 MG tablet Take 1 tablet (1,000 mg total) by mouth daily. 30 tablet 5  . VYVANSE 20 MG capsule Take 20 mg by mouth daily.     . [DISCONTINUED] potassium chloride (K-DUR) 10 MEQ tablet Take 1 tablet (10 mEq total) by mouth daily. 30 tablet 1   No current facility-administered medications on file prior to visit.    BP (!) 148/88 (BP Location: Left Arm, Patient Position: Sitting)   Pulse 81      Observations/Objective:   Gen: Awake, alert, no acute distress Head: [place]/AT Resp: Breathing is even and non-labored Psych: calm/pleasant demeanor Neuro: Alert and Oriented x 3, + facial symmetry, speech is clear.     Assessment and Plan:  Scalp pain- most likely cause is migraine variant, less likely early presentation of herpes zoster. Will give pt a trial of prn imitrex to see if this helps.  He is advised to monitor the area for rashes/blisters and call if symptoms worsen or fail to improve.  Pt verbalizes understanding.    Follow Up  Instructions:    I discussed the assessment and treatment plan with the patient. The patient was provided an opportunity to ask questions and all were answered. The patient agreed with the plan and demonstrated an understanding of the instructions.   The patient was advised to call back or seek an in-person evaluation if the symptoms worsen or if the condition fails to improve as anticipated.  Nance Pear, NP

## 2019-07-04 NOTE — Telephone Encounter (Signed)
See OV noted from today.

## 2019-07-06 ENCOUNTER — Ambulatory Visit: Admitting: Family

## 2019-07-21 ENCOUNTER — Ambulatory Visit: Admitting: Physical Medicine & Rehabilitation

## 2019-07-25 ENCOUNTER — Encounter: Payer: Self-pay | Admitting: Family

## 2019-08-05 ENCOUNTER — Encounter: Payer: Self-pay | Admitting: Family

## 2019-08-05 ENCOUNTER — Other Ambulatory Visit: Payer: Self-pay

## 2019-08-05 ENCOUNTER — Telehealth (INDEPENDENT_AMBULATORY_CARE_PROVIDER_SITE_OTHER): Admitting: Family Medicine

## 2019-08-05 ENCOUNTER — Encounter: Payer: Self-pay | Admitting: Family Medicine

## 2019-08-05 DIAGNOSIS — I10 Essential (primary) hypertension: Secondary | ICD-10-CM | POA: Diagnosis not present

## 2019-08-05 MED ORDER — HYDROCHLOROTHIAZIDE 25 MG PO TABS: 25.0000 mg | ORAL_TABLET | Freq: Every day | ORAL | 3 refills | Status: DC

## 2019-08-05 NOTE — Progress Notes (Signed)
Chief Complaint  Patient presents with  . Headache  . Fatigue    Subjective Charles Nash. is a 54 y.o. male who presents for hypertension follow up. Due to COVID-19 pandemic, we are interacting via web portal for an electronic face-to-face visit. I verified patient's ID using 2 identifiers. Patient agreed to proceed with visit via this method. Patient is at home, I am at office. Patient and I are present for visit.  He does monitor home blood pressures. Blood pressures ranging from 140-160's/90-100's on average. He is compliant with medications- Toprol XL bid, hydralazine 50 mg TID, losartan 50 mg/d, Norvasc 5 mg/d. Patient has these side effects of medication: none He is not adhering to a healthy diet overall. Current exercise: none   Past Medical History:  Diagnosis Date  . ADHD (attention deficit hyperactivity disorder)   . Allergy   . Arthritis   . Bilateral varicoceles   . Depression   . Fatty liver 08/06/2011  . Genital herpes 10/25/2014  . GERD (gastroesophageal reflux disease)   . Hyperlipidemia   . Hypertension   . OSA (obstructive sleep apnea)   . Personal history of colonic polyps   . Plantar fasciitis   . PTSD (post-traumatic stress disorder)     Exam No conversational dyspnea Age appropriate judgment and insight Nml affect and mood  Essential hypertension - Plan: hydrochlorothiazide (HYDRODIURIL) 25 MG tablet, Basic metabolic panel  Add HCTZ, pt reports he did well on this in past. Counseled on diet and exercise. BMP in 1 week.  F/u in 4 weeks to reck BP. The patient voiced understanding and agreement to the plan.  [place], DO 08/05/19  3:53 PM

## 2019-08-06 ENCOUNTER — Other Ambulatory Visit: Payer: Self-pay

## 2019-08-06 ENCOUNTER — Encounter (HOSPITAL_BASED_OUTPATIENT_CLINIC_OR_DEPARTMENT_OTHER): Payer: Self-pay

## 2019-08-06 ENCOUNTER — Emergency Department (HOSPITAL_BASED_OUTPATIENT_CLINIC_OR_DEPARTMENT_OTHER)
Admission: EM | Admit: 2019-08-06 | Discharge: 2019-08-06 | Disposition: A | Discharge status: Home / Self Care | Attending: Emergency Medicine | Admitting: Emergency Medicine

## 2019-08-06 DIAGNOSIS — I10 Essential (primary) hypertension: Secondary | ICD-10-CM | POA: Diagnosis present

## 2019-08-06 DIAGNOSIS — R519 Headache, unspecified: Secondary | ICD-10-CM | POA: Insufficient documentation

## 2019-08-06 DIAGNOSIS — E876 Hypokalemia: Secondary | ICD-10-CM | POA: Diagnosis not present

## 2019-08-06 DIAGNOSIS — Z7984 Long term (current) use of oral hypoglycemic drugs: Secondary | ICD-10-CM | POA: Insufficient documentation

## 2019-08-06 DIAGNOSIS — Z888 Allergy status to other drugs, medicaments and biological substances status: Secondary | ICD-10-CM | POA: Diagnosis not present

## 2019-08-06 DIAGNOSIS — Z79899 Other long term (current) drug therapy: Secondary | ICD-10-CM | POA: Insufficient documentation

## 2019-08-06 DIAGNOSIS — Z885 Allergy status to narcotic agent status: Secondary | ICD-10-CM | POA: Diagnosis not present

## 2019-08-06 DIAGNOSIS — E785 Hyperlipidemia, unspecified: Secondary | ICD-10-CM | POA: Insufficient documentation

## 2019-08-06 DIAGNOSIS — E119 Type 2 diabetes mellitus without complications: Secondary | ICD-10-CM | POA: Diagnosis not present

## 2019-08-06 LAB — CBC WITH DIFFERENTIAL/PLATELET
Abs Immature Granulocytes: 0.03 10*3/uL (ref 0.00–0.07)
Basophils Absolute: 0 10*3/uL (ref 0.0–0.1)
Basophils Relative: 1 %
Eosinophils Absolute: 0.1 10*3/uL (ref 0.0–0.5)
Eosinophils Relative: 1 %
HCT: 46.9 % (ref 39.0–52.0)
Hemoglobin: 16.1 g/dL (ref 13.0–17.0)
Immature Granulocytes: 1 %
Lymphocytes Relative: 34 %
Lymphs Abs: 2 10*3/uL (ref 0.7–4.0)
MCH: 31.3 pg (ref 26.0–34.0)
MCHC: 34.3 g/dL (ref 30.0–36.0)
MCV: 91.2 fL (ref 80.0–100.0)
Monocytes Absolute: 0.5 10*3/uL (ref 0.1–1.0)
Monocytes Relative: 8 %
Neutro Abs: 3.3 10*3/uL (ref 1.7–7.7)
Neutrophils Relative %: 55 %
Platelets: 286 10*3/uL (ref 150–400)
RBC: 5.14 MIL/uL (ref 4.22–5.81)
RDW: 12.6 % (ref 11.5–15.5)
WBC: 6 10*3/uL (ref 4.0–10.5)
nRBC: 0 % (ref 0.0–0.2)

## 2019-08-06 LAB — BASIC METABOLIC PANEL
Anion gap: 12 (ref 5–15)
BUN: 16 mg/dL (ref 6–20)
CO2: 28 mmol/L (ref 22–32)
Calcium: 8.8 mg/dL — ABNORMAL LOW (ref 8.9–10.3)
Chloride: 100 mmol/L (ref 98–111)
Creatinine, Ser: 1.22 mg/dL (ref 0.61–1.24)
GFR calc Af Amer: >60 mL/min (ref >60)
GFR calc non Af Amer: >60 mL/min (ref >60)
Glucose, Bld: 128 mg/dL — ABNORMAL HIGH (ref 70–99)
Potassium: 3.2 mmol/L — ABNORMAL LOW (ref 3.5–5.1)
Sodium: 140 mmol/L (ref 135–145)

## 2019-08-06 MED ORDER — POTASSIUM CHLORIDE CRYS ER 20 MEQ PO TBCR
40.0000 meq | EXTENDED_RELEASE_TABLET | Freq: Once | ORAL | Status: AC
  Administered 2019-08-06: 22:00:00 40 meq via ORAL
  Filled 2019-08-06: qty 2

## 2019-08-06 NOTE — ED Notes (Signed)
Pharmacy and medications updated with patient 

## 2019-08-06 NOTE — ED Provider Notes (Signed)
[facility] Provider Note   CSN: [id number] Arrival date & time: 08/06/19  2016     History Chief Complaint  Patient presents with  . Hypertension    Charles Nash. is a 54 y.o. male.  Patient is a 54 year old male with a history of hypertension who presents with elevated blood pressures.  He states over the last 2 weeks he has had increased blood pressures.  He has been concerned because his blood pressures have been running about 180 over 100s.  He saw his PCP twice over the last couple of weeks.  The first time, he states that his losartan was increased from half a pill to a whole pill.  He also saw them yesterday on a video visit due to his ongoing increased blood pressures and hydrochlorothiazide was added to his regimen.  He has been having some intermittent headaches over the last 2 weeks.  They are mostly left-sided.  He describes it as a sharp pain that starts behind his eye and radiates to his left head.  He has no vision changes.  No numbness or weakness to his extremities.  No difficulty with his balance.  No speech deficits.  He currently denies a headache but says they have been intermittent.  At one point he had some numbness and tingling in the top of the scalp but that has resolved.  He denies any chest pain or shortness of breath.  No dizziness.        Past Medical History:  Diagnosis Date  . ADHD (attention deficit hyperactivity disorder)   . Allergy   . Arthritis   . Bilateral varicoceles   . Depression   . Fatty liver 08/06/2011  . Genital herpes 10/25/2014  . GERD (gastroesophageal reflux disease)   . Hyperlipidemia   . Hypertension   . OSA (obstructive sleep apnea)   . Personal history of colonic polyps   . Plantar fasciitis   . PTSD (post-traumatic stress disorder)     Patient Active Problem List   Diagnosis Date Noted  . S/P laparoscopic appendectomy 05/31/2019  . Acute appendicitis 05/06/2019  . History of sleeve  gastrectomy 2017 05/06/2019  . PONV (postoperative nausea and vomiting) 05/06/2019  . Recurrent urticaria 03/02/2019  . Mild intermittent asthma 03/02/2019  . Sensorineural hearing loss (SNHL), bilateral 07/29/2017  . Myofascial pain dysfunction syndrome 01/02/2017  . Insect bites 11/10/2016  . Diabetic polyneuropathy associated with type 2 diabetes mellitus ([place]) 08/25/2016  . Achilles tendinitis, left leg 08/25/2016  . Achilles tendon contracture, left 08/25/2016  . Xerosis of skin 04/22/2016  . Chronic urticaria 03/05/2016  . Sacroiliac joint dysfunction of both sides 01/01/2016  . Anxiety state 08/20/2015  . 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 Patellofemoral disorder 05/15/2015  . Current use of beta blocker 05/15/2015  . Genital herpes 10/25/2014  . Depression 10/05/2014  . Osteoarthritis of left knee 08/15/2014  . 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 Sciatica 04/22/2014  . Osteoarthritis of right knee 02/06/2014  . Allergy to mold 12/13/2013  . Weight gain, abnormal 11/08/2013  . PTSD (post-traumatic stress disorder) 07/05/2013  . Multinodular goiter 01/19/2013  . GERD (gastroesophageal reflux disease) 08/09/2012  . Dermographia 12/29/2011  . Neck pain 11/12/2011  . Fatty liver 08/06/2011  . Cervical disc disease 07/28/2011  . Preventative health care 07/28/2011  . Erectile dysfunction 05/23/2011  . Elevated liver function tests 05/20/2011  . Type II diabetes mellitus, well controlled ([place]) 05/19/2011  .  Hyperlipidemia 05/19/2011  . Obesity (BMI 30-39.9) 05/19/2011  . Other allergic rhinitis 05/19/2011  . Obstructive sleep apnea treated with continuous positive airway pressure (CPAP) 05/19/2011  . HTN (hypertension) 09/12/2000    Past Surgical History:  Procedure Laterality Date  . ANKLE SURGERY    . DOPPLER ECHOCARDIOGRAPHY  2010  . FOOT SURGERY    . KNEE SURGERY  08/04/08   Right knee-- medial meniscus repair, attenuation  anterior cruciate [name] MD [facility])   . LAPAROSCOPIC APPENDECTOMY N/A 05/06/2019   Procedure: APPENDECTOMY LAPAROSCOPIC;  Surgeon: Clovis Riley, MD;  Location: [facility];  Service: General;  Laterality: N/A;  . NM MYOVIEW LTD  2010  . PLANTAR FASCIA RELEASE    . SHOULDER SURGERY    . SLEEVE GASTROPLASTY  10/29/2016  . VARICOCELE EXCISION         Family History  Problem Relation Age of Onset  . Diabetes Mother   . Hypertension Mother   . Arthritis Mother   . Cancer Mother        uterine and breast cancer, sarcoma of the brain  . Hyperlipidemia Father   . Arthritis Father   . Cancer Father        thyroid, prostate cancer  . Other Father        mass in stomach  . Hyperlipidemia Maternal Grandmother   . Hypertension Maternal Grandmother   . Hyperlipidemia Maternal Grandfather   . Hypertension Maternal Grandfather   . Hyperlipidemia Paternal Grandmother   . Hypertension Paternal Grandmother   . Hyperlipidemia Paternal Grandfather   . Hypertension Paternal Grandfather   . Heart attack Paternal Grandfather   . Sudden death Neg Hx   . Colon cancer Neg Hx   . Esophageal cancer Neg Hx   . Stomach cancer Neg Hx   . Rectal cancer Neg Hx     Social History   Tobacco Use  . Smoking status: Never Smoker  . Smokeless tobacco: Never Used  Substance Use Topics  . Alcohol use: No    Alcohol/week: 0.0 standard drinks  . Drug use: No    Home Medications Prior to Admission medications   Medication Sig Start Date End Date Taking? Authorizing Provider  acetaminophen (TYLENOL) 500 MG tablet Take 1,000 mg by mouth every 6 (six) hours as needed for mild pain.     [provider]  Albuterol Sulfate 108 (90 Base) MCG/ACT AEPB Inhale 1-2 puffs into the lungs every 6 (six) hours as needed (wheezing, sob). 06/30/18   Shelda Pal, DO  amLODipine (NORVASC) 5 MG tablet Take 1 tablet (5 mg total) by mouth daily. 05/26/19   Ann Held, DO   atorvastatin (LIPITOR) 10 MG tablet Take 1 tablet (10 mg total) by mouth daily. 11/25/18   Debbrah Alar, NP  buPROPion (WELLBUTRIN XL) 300 MG 24 hr tablet Take 1 tablet (300 mg total) by mouth daily. 12/26/16   Debbrah Alar, NP  CALCIUM-MAGNESIUM-VITAMIN D PO Take 1 tablet by mouth 3 (three) times daily.    [provider]  cetirizine (ZYRTEC) 10 MG tablet Take 1 tablet (10 mg total) by mouth 2 (two) times daily. Patient taking differently: Take 10 mg by mouth daily as needed for allergies.  06/17/18   Valentina Shaggy, MD  Cyanocobalamin (VITAMIN B12 PO) Take 2 tablets by mouth daily.     [provider]  fexofenadine (ALLEGRA) 180 MG tablet Take 1 tablet (180 mg total) by mouth daily as needed for allergies  or rhinitis. 11/10/16   Bobbitt, Sedalia Muta, MD  fluticasone (FLONASE) 50 MCG/ACT nasal spray Place 2 sprays into both nostrils daily as needed for allergies or rhinitis. 04/29/18   Valentina Shaggy, MD  hydrALAZINE (APRESOLINE) 50 MG tablet TAKE 1 TABLET THREE TIMES A DAY 08/24/17   Debbrah Alar, NP  hydrochlorothiazide (HYDRODIURIL) 25 MG tablet Take 1 tablet (25 mg total) by mouth daily. 08/05/19   Shelda Pal, DO  losartan (COZAAR) 50 MG tablet 1 po qd 05/26/19   Carollee Herter, Kendrick Fries R, DO  metoprolol succinate (TOPROL XL) 100 MG 24 hr tablet Take 1 tablet (100 mg total) by mouth 2 (two) times daily. Take with or immediately following a meal. 02/08/19   Debbrah Alar, NP  Multiple Vitamin (MULTI-VITAMINS) TABS Take 1 tablet by mouth daily.    [provider]  omeprazole (PRILOSEC) 20 MG capsule Take 1 capsule (20 mg total) by mouth 2 (two) times daily before a meal. 11/25/18   Debbrah Alar, NP  ondansetron (ZOFRAN-ODT) 4 MG disintegrating tablet Take 1 tablet (4 mg total) by mouth every 6 (six) hours as needed for nausea. 05/06/19   Norm Parcel, PA-C  polyethylene glycol (MIRALAX / GLYCOLAX) packet Take 17 g by  mouth daily as needed for mild constipation.  10/30/16   [provider]  QUEtiapine (SEROQUEL) 25 MG tablet Take 1 tablet (25 mg total) by mouth at bedtime. 08/06/17   Debbrah Alar, NP  SAXENDA 18 MG/3ML SOPN  05/17/19   [provider]  sildenafil (VIAGRA) 100 MG tablet TAKE 1/2 TO 1 TABLET BY MOUTH DAILY AS NEEDED 03/13/17   Debbrah Alar, NP  SUMAtriptan (IMITREX) 50 MG tablet May repeat in 2 hours if headache persists or recurs. 07/04/19   Debbrah Alar, NP  traMADol (ULTRAM) 50 MG tablet Take 1 tablet (50 mg total) by mouth every 6 (six) hours as needed. Patient taking differently: Take 50 mg by mouth every 6 (six) hours as needed for moderate pain.  04/22/19   Kirsteins, Luanna Salk, MD  triamcinolone ointment (KENALOG) 0.1 % Apply to red itchy areas only below the face 03/02/19   Bobbitt, Sedalia Muta, MD  valACYclovir (VALTREX) 1000 MG tablet Take 1 tablet (1,000 mg total) by mouth daily. 03/08/19   Debbrah Alar, NP  VYVANSE 20 MG capsule Take 20 mg by mouth daily.  04/10/17   [provider]  potassium chloride (K-DUR) 10 MEQ tablet Take 1 tablet (10 mEq total) by mouth daily. 05/19/11 07/11/11  Debbrah Alar, NP    Allergies    Oxycodone and Zoloft [sertraline hcl]  Review of Systems   Review of Systems  Constitutional: Negative for chills, diaphoresis, fatigue and fever.  HENT: Negative for congestion, rhinorrhea and sneezing.   Eyes: Negative.   Respiratory: Negative for cough, chest tightness and shortness of breath.   Cardiovascular: Negative for chest pain and leg swelling.  Gastrointestinal: Negative for abdominal pain, blood in stool, diarrhea, nausea and vomiting.  Genitourinary: Negative for difficulty urinating, flank pain, frequency and hematuria.  Musculoskeletal: Negative for arthralgias and back pain.  Skin: Negative for rash.  Neurological: Positive for headaches. Negative for dizziness, speech difficulty, weakness and  numbness.    Physical Exam Updated Vital Signs BP (!) 155/90 (BP Location: Right Arm)   Pulse 84   Temp 98.1 F (36.7 C) (Oral)   Resp 20   Ht 6\' 1"  (1.854 m)   Wt 132.9 kg   SpO2 99%   BMI  38.66 kg/m   Physical Exam Constitutional:      Appearance: He is well-developed.  HENT:     Head: Normocephalic and atraumatic.  Eyes:     Pupils: Pupils are equal, round, and reactive to light.  Cardiovascular:     Rate and Rhythm: Normal rate and regular rhythm.     Heart sounds: Normal heart sounds.  Pulmonary:     Effort: Pulmonary effort is normal. No respiratory distress.     Breath sounds: Normal breath sounds. No wheezing or rales.  Chest:     Chest wall: No tenderness.  Abdominal:     General: Bowel sounds are normal.     Palpations: Abdomen is soft.     Tenderness: There is no abdominal tenderness. There is no guarding or rebound.  Musculoskeletal:        General: Normal range of motion.     Cervical back: Normal range of motion and neck supple.  Lymphadenopathy:     Cervical: No cervical adenopathy.  Skin:    General: Skin is warm and dry.     Findings: No rash.  Neurological:     Mental Status: He is alert and oriented to person, place, and time.     Comments: Motor 5/5 all extremities Sensation grossly intact to LT all extremities Finger to Nose intact, no pronator drift CN II-XII grossly intact Gait normal      ED Results / Procedures / Treatments   Labs (all labs ordered are listed, but only abnormal results are displayed) Labs Reviewed  BASIC METABOLIC PANEL - Abnormal; Notable for the following components:      Result Value   Potassium 3.2 (*)    Glucose, Bld 128 (*)    Calcium 8.8 (*)    All other components within normal limits  CBC WITH DIFFERENTIAL/PLATELET    EKG EKG Interpretation  Date/Time:  Saturday August 06 2019 20:59:25 EDT Ventricular Rate:  76 PR Interval:  152 QRS Duration: 106 QT Interval:  426 QTC Calculation: 479 R  Axis:   114 Text Interpretation: Normal sinus rhythm Left posterior fascicular block Abnormal ECG since last tracing no significant change Confirmed by Malvin Johns [phone]) on 08/06/2019 9:11:22 PM   Radiology No results found.  Procedures Procedures (including critical care time)  Medications Ordered in ED Medications  potassium chloride SA (KLOR-CON) CR tablet 40 mEq (40 mEq Oral Given 08/06/19 2209)    ED Course  I have reviewed the triage vital signs and the nursing notes.  Pertinent labs & imaging results that were available during my care of the patient were reviewed by me and considered in my medical decision making (see chart for details).    MDM Rules/Calculators/A&P                      Patient is a 54 year old male who presents with elevated blood pressures.  His blood pressure has improved in the ED.  It is currently 155/90.  He is asymptomatic.  His labs show normal creatinine.  His potassium is slightly low and he was given a dose of potassium in the ED.  He was recently started on hydrochlorothiazide and has a follow-up BMP next week.  He has been having some intermittent headaches but currently denies any headache.  He does not have any neurologic deficits or other symptoms that warrant imaging currently.  I did have a discussion with him because he is concerned about his headaches given that his mother died  of a brain tumor.  I did offer him imaging tonight although I feel given that he does not have any other associated symptoms and no persistent headaches, it would be low yield.  He is declining it tonight but will follow up with his PCP if his headaches continue.  Return precautions were given. Final Clinical Impression(s) / ED Diagnoses Final diagnoses:  Essential hypertension  Hypokalemia    Rx / DC Orders ED Discharge Orders    None       Malvin Johns, MD 08/06/19 2228

## 2019-08-06 NOTE — ED Triage Notes (Signed)
Pt here today for elevated B/P. Pt states his PCP recently doubled his dose of Losartan and then added on HCTZ, but his B/P is not going down. Pt's only associated symptom is a dull HA. Denies CP, ShOB, dizziness.

## 2019-08-06 NOTE — Discharge Instructions (Addendum)
Follow-up with your primary care doctor to monitor your blood pressure and your potassium level.  Return to the emergency room if you have any worsening symptoms including worsening headaches, vomiting, dizziness, chest pain or other worsening symptoms.  If your headaches continue, you need to follow-up with your primary care doctor regarding possible imaging studies of your brain.

## 2019-08-09 ENCOUNTER — Other Ambulatory Visit: Payer: Self-pay

## 2019-08-11 ENCOUNTER — Encounter: Admitting: Physical Medicine & Rehabilitation

## 2019-08-12 ENCOUNTER — Ambulatory Visit (INDEPENDENT_AMBULATORY_CARE_PROVIDER_SITE_OTHER): Admitting: Family

## 2019-08-12 ENCOUNTER — Encounter: Payer: Self-pay | Admitting: Neurology

## 2019-08-12 ENCOUNTER — Encounter: Payer: Self-pay | Admitting: Family

## 2019-08-12 ENCOUNTER — Other Ambulatory Visit: Payer: Self-pay

## 2019-08-12 VITALS — BP 149/85 | HR 72

## 2019-08-12 DIAGNOSIS — I10 Essential (primary) hypertension: Secondary | ICD-10-CM

## 2019-08-12 DIAGNOSIS — E876 Hypokalemia: Secondary | ICD-10-CM

## 2019-08-12 DIAGNOSIS — R519 Headache, unspecified: Secondary | ICD-10-CM

## 2019-08-12 MED ORDER — POTASSIUM CHLORIDE ER 10 MEQ PO TBCR: 30.0000 meq | EXTENDED_RELEASE_TABLET | Freq: Every day | ORAL | Status: AC

## 2019-08-12 MED ORDER — AMLODIPINE BESYLATE 5 MG PO TABS: 7.5000 mg | ORAL_TABLET | Freq: Every day | ORAL | 1 refills | Status: DC

## 2019-08-12 NOTE — Progress Notes (Signed)
Virtual Visit via Telephone Note  I connected with Charles Nash. on 08/12/19 at  7:20 AM EDT by telephone and verified that I am speaking with the correct person using two identifiers.  Location: Patient: home Provider: work   I discussed the limitations, risks, security and privacy concerns of performing an evaluation and management service by telephone and the availability of in person appointments. I also discussed with the patient that there may be a patient responsible charge related to this service. The patient expressed understanding and agreed to proceed.   History of Present Illness:  Last visit pt c/o HA and scalp pain. We treated him with prn imitrex for possible migraines. No current scalp pain. Was afraid to take imitrex.   HTN- maintained on losartan 50mg , amlodipine 5mg , hctz 25mg  once daily. He reports intermittent HA behind either eye.  Reports that he has an rx for [place] from the [place].  Taking 3 tabs (prior to ER visit he was taking 2 tabs).   BP Readings from Last 3 Encounters:  08/12/19 (!) 149/85  08/06/19 (!) 168/104  07/04/19 (!) 148/88    Observations/Objective:  Gen: Awake, alert, no acute distress Resp: Breathing is even and non-labored Psych: calm/pleasant demeanor Neuro: Alert and Oriented x 3,  speech is clear.   Assessment and Plan:  Headaches- uncontrolled. Will refer to neurology for further evaluation.  Hypokalemia- due for follow up bmet.  He will call back to schedule this appointment. Continue Kdur 30 mEQ once daily.  HTN- improving. Increase amlodipine from 5mg  to 7.5mg  once daily. Continue other antihypertensives.  Follow Up Instructions:    I discussed the assessment and treatment plan with the patient. The patient was provided an opportunity to ask questions and all were answered. The patient agreed with the plan and demonstrated an understanding of the instructions.   The patient was advised to call back or seek an in-person  evaluation if the symptoms worsen or if the condition fails to improve as anticipated.  I provided 15 minutes of non-face-to-face time during this encounter.   Nance Pear, NP

## 2019-08-12 NOTE — Patient Instructions (Signed)
Please  Increase your amlodipine to 1.5 tabs (7.5mg ) once daily.

## 2019-08-23 ENCOUNTER — Other Ambulatory Visit (INDEPENDENT_AMBULATORY_CARE_PROVIDER_SITE_OTHER)

## 2019-08-23 ENCOUNTER — Other Ambulatory Visit: Payer: Self-pay

## 2019-08-23 DIAGNOSIS — I10 Essential (primary) hypertension: Secondary | ICD-10-CM | POA: Diagnosis not present

## 2019-08-23 LAB — BASIC METABOLIC PANEL
BUN: 9 mg/dL (ref 6–23)
CO2: 31 mEq/L (ref 19–32)
Calcium: 9.5 mg/dL (ref 8.4–10.5)
Chloride: 100 mEq/L (ref 96–112)
Creatinine, Ser: 1.09 mg/dL (ref 0.40–1.50)
GFR: 85.58 mL/min (ref >60.00)
Glucose, Bld: 115 mg/dL — ABNORMAL HIGH (ref 70–99)
Potassium: 3.8 mEq/L (ref 3.5–5.1)
Sodium: 138 mEq/L (ref 135–145)

## 2019-09-23 ENCOUNTER — Encounter: Payer: Self-pay | Admitting: Family

## 2019-09-26 ENCOUNTER — Ambulatory Visit (INDEPENDENT_AMBULATORY_CARE_PROVIDER_SITE_OTHER): Admitting: Family Medicine

## 2019-09-26 ENCOUNTER — Other Ambulatory Visit: Payer: Self-pay

## 2019-09-26 ENCOUNTER — Encounter: Payer: Self-pay | Admitting: Family Medicine

## 2019-09-26 VITALS — BP 132/86 | HR 80 | Temp 96.5°F | Ht 73.0 in | Wt 287.1 lb

## 2019-09-26 DIAGNOSIS — R109 Unspecified abdominal pain: Secondary | ICD-10-CM | POA: Diagnosis not present

## 2019-09-26 DIAGNOSIS — G4700 Insomnia, unspecified: Secondary | ICD-10-CM | POA: Diagnosis not present

## 2019-09-26 MED ORDER — PREDNISONE 20 MG PO TABS: 40.0000 mg | ORAL_TABLET | Freq: Every day | ORAL | 0 refills | Status: AC

## 2019-09-26 MED ORDER — TRAZODONE HCL 50 MG PO TABS: 25.0000 mg | ORAL_TABLET | Freq: Every evening | ORAL | 3 refills | Status: DC | PRN

## 2019-09-26 NOTE — Patient Instructions (Addendum)
I think this is the abdominal wall muscle (obliques).  Stretch the area daily.   Heat (pad or rice pillow in microwave) over affected area, 10-15 minutes twice daily.   Ice/cold pack over area for 10-15 min twice daily.  OK to continue Biofreeze.  Sleep is important to [place] all. Getting good sleep is imperative to adequate functioning during the day. Work with our counselors who are trained to help people obtain quality sleep. Call [phone] to schedule an appointment or if you are curious about insurance coverage/cost.  Sleep Hygiene Tips:  Do not watch TV or look at screens within 1 hour of going to bed. If you do, make sure there is a blue light filter (nighttime mode) involved.  Try to go to bed around the same time every night. Wake up at the same time within 1 hour of regular time. Ex: If you wake up at 7 AM for work, do not sleep past 8 AM on days that you don't work.  Do not drink alcohol before bedtime.  Do not consume caffeine-containing beverages after noon or within 9 hours of intended bedtime.  Get regular exercise/physical activity in your life, but not within 2 hours of planned bedtime.  Do not take naps.   Do not eat within 2 hours of planned bedtime.  Melatonin, 3-5 mg 30-60 minutes before planned bedtime may be helpful.   The bed should be for sleep or sex only. If after 20-30 minutes you are unable to fall asleep, get up and do something relaxing. Do this until you feel ready to go to sleep again.   Let us know if you need anything.

## 2019-09-26 NOTE — Progress Notes (Signed)
Chief Complaint  Patient presents with  . Flank Pain    right side    Subjective: Patient is a 54 y.o. male here for R sided abd pain radiating to flank for the past 3 d. Leaning to his left/stretching and laying on it is when he feels it the most. Describes it as a tightness/burning sensation. Has been constipated, taking MiraLAX daily. BM helped a little. Eating doesn't bother this. +nausea on D1, no vomiting. No inj/change in activity, fevers, or skin changes, urinary complaints. Biofreeze helped a little. No personal/famhx of kidney stones.   +hx of insomnia. Was on Ambien and changed to Seroquel. [facility]. He has never been on trazodone.   Past Medical History:  Diagnosis Date  . ADHD (attention deficit hyperactivity disorder)   . Allergy   . Arthritis   . Bilateral varicoceles   . Depression   . Fatty liver 08/06/2011  . Genital herpes 10/25/2014  . GERD (gastroesophageal reflux disease)   . Hyperlipidemia   . Hypertension   . OSA (obstructive sleep apnea)   . Personal history of colonic polyps   . Plantar fasciitis   . PTSD (post-traumatic stress disorder)     Objective: BP 132/86 (BP Location: Left Arm, Patient Position: Sitting, Cuff Size: Large)   Pulse 80   Temp (!) 96.5 F (35.8 C) (Temporal)   Ht 6\' 1"  (1.854 m)   Wt 287 lb 2 oz (130.2 kg)   SpO2 96%   BMI 37.88 kg/m  General: Awake, appears stated age HEENT: MMM, EOMi Heart: RRR MSK: +ttp over R oblique, no CVA ttp Abd: BS+, soft, nontender, nondistended, no masses or organomegaly Skin: No external lesions or rashes noted on right side of abdomen Lungs: CTAB, no rales, wheezes or rhonchi. No accessory muscle use Psych: Age appropriate judgment and insight, normal affect and mood  Assessment and Plan: Right sided abdominal pain - Plan: predniSONE (DELTASONE) 20 MG tablet  Insomnia, unspecified type - Plan: traZODone (DESYREL) 50 MG tablet  1-suspect strain of right oblique muscle; stretches,  heat, ice, Tylenol, prednisone burst, topical menthol.  If no improvement, he will let us know in the next 3 to 4 weeks. 2-trial trazodone.  He is stopped Ambien in place of Seroquel.  The [facility]. F/u in 1 mo w reg PCP regarding this.  The patient voiced understanding and agreement to the plan.  [place], DO 09/26/19  3:44 PM

## 2019-10-04 ENCOUNTER — Encounter: Payer: Self-pay | Admitting: Family

## 2019-10-20 ENCOUNTER — Other Ambulatory Visit: Payer: Self-pay

## 2019-10-20 MED ORDER — VALACYCLOVIR HCL 1 G PO TABS: 1000.0000 mg | ORAL_TABLET | Freq: Every day | ORAL | 5 refills | Status: DC

## 2019-10-25 ENCOUNTER — Telehealth (INDEPENDENT_AMBULATORY_CARE_PROVIDER_SITE_OTHER): Admitting: Family

## 2019-10-25 ENCOUNTER — Other Ambulatory Visit: Payer: Self-pay

## 2019-10-25 ENCOUNTER — Encounter: Payer: Self-pay | Admitting: Family

## 2019-10-25 VITALS — BP 136/84 | Wt 281.0 lb

## 2019-10-25 DIAGNOSIS — G4700 Insomnia, unspecified: Secondary | ICD-10-CM | POA: Diagnosis not present

## 2019-10-25 NOTE — Progress Notes (Signed)
Virtual Visit via Video Note  I connected with Charles Nash. on 10/25/19 at  5:20 PM EDT by a video enabled telemedicine application and verified that I am speaking with the correct person using two identifiers.  Location: Patient: home Provider: home   I discussed the limitations of evaluation and management by telemedicine and the availability of in person appointments. The patient expressed understanding and agreed to proceed. Only the patient and myself were present for today's video call.   History of Present Illness:  Patient is a 54 year old male who presents today for follow-up of his insomnia. He was on [place]. He reports that the [place] changed his Ambien to Seroquel. He reports that he could not tolerate the Seroquel. He saw Dr. Nani Ravens on 09/26/2019 who gave him a trial of trazodone. He reports that the trazodone has not been helping him with sleep. He is sleeping very little. He desires to go back on Ambien.  Observations/Objective:   Gen: Awake, alert, no acute distress Resp: Breathing is even and non-labored Psych: calm/pleasant demeanor Neuro: Alert and Oriented x 3, + facial symmetry, speech is clear.   Assessment and Plan:  Insomnia-uncontrolled. Has previously been well controlled on Ambien. I advised the patient that in order to prescribe Ambien he will need to complete a urine drug screen and a controlled substance contract. He has been scheduled to come to the lab tomorrow afternoon to provide UDS and sign controlled substance contract. Plan to initiate ambien 5mg  once daily after he completes these tasks.  Follow Up Instructions:    I discussed the assessment and treatment plan with the patient. The patient was provided an opportunity to ask questions and all were answered. The patient agreed with the plan and demonstrated an understanding of the instructions.   The patient was advised to call back or seek an in-person evaluation if the symptoms worsen or if  the condition fails to improve as anticipated.  Nance Pear, NP

## 2019-10-26 ENCOUNTER — Other Ambulatory Visit

## 2019-10-31 NOTE — Progress Notes (Deleted)
NEUROLOGY CONSULTATION NOTE  Charles Nash. MRN: [id number] DOB: [birth year]  Referring provider: Debbrah Alar, NP Primary care provider: Debbrah Alar, NP  Reason for consult:  headache  HISTORY OF PRESENT ILLNESS: Charles Nash is a 54 year old ***-handed male with HTN, ADHD, and depression who presents for headache.  History supplemented by referring provider's note.  ***  CT head from 01/07/2019 personally reviewed was unremarkable.  Current NSAIDS:  *** Current analgesics:  tramadol Current triptans:  Sumatriptan 50mg  Current ergotamine:  *** Current anti-emetic:  *** Current muscle relaxants:  *** Current anti-anxiolytic:  *** Current sleep aide:  *** Current Antihypertensive medications:  Amlodipine, Toprol XL, losartan, HCTZ, hydralazine Current Antidepressant medications:  Wellbutrin Current Anticonvulsant medications:  *** Current anti-CGRP:  *** Current Vitamins/Herbal/Supplements:  B12, MVI, KCl Current Antihistamines/Decongestants:  Allegra Other therapy:  *** Hormone/birth control:  *** Other medications:  ***  Past NSAIDS:  *** Past analgesics:  *** Past abortive triptans:  none Past abortive ergotamine:  none Past muscle relaxants:  Flexeril Past anti-emetic:  none Past antihypertensive medications:  Lisinopril, clonidine, Lasix Past antidepressant medications:  sertraine Past anticonvulsant medications:  Topiramate, gabapentin Past anti-CGRP:  none Past vitamins/Herbal/Supplements:  none Past antihistamines/decongestants:  Benadryl Other past therapies:  ***  Caffeine:  *** Alcohol:  *** Smoker:  *** Diet:  *** Exercise:  *** Depression:  ***; Anxiety:  *** Other pain:  *** Sleep hygiene:  *** Family history of headache:  ***   PAST MEDICAL HISTORY: Past Medical History:  Diagnosis Date  . ADHD (attention deficit hyperactivity disorder)   . Allergy   . Arthritis   . Bilateral varicoceles   . Depression   . Fatty  liver 08/06/2011  . Genital herpes 10/25/2014  . GERD (gastroesophageal reflux disease)   . Hyperlipidemia   . Hypertension   . OSA (obstructive sleep apnea)   . Personal history of colonic polyps   . Plantar fasciitis   . PTSD (post-traumatic stress disorder)     PAST SURGICAL HISTORY: Past Surgical History:  Procedure Laterality Date  . ANKLE SURGERY    . DOPPLER ECHOCARDIOGRAPHY  2010  . FOOT SURGERY    . KNEE SURGERY  08/04/08   Right knee-- medial meniscus repair, attenuation anterior cruciate [name] MD [facility])   . LAPAROSCOPIC APPENDECTOMY N/A 05/06/2019   Procedure: APPENDECTOMY LAPAROSCOPIC;  Surgeon: Clovis Riley, MD;  Location: [facility];  Service: General;  Laterality: N/A;  . NM MYOVIEW LTD  2010  . PLANTAR FASCIA RELEASE    . SHOULDER SURGERY    . SLEEVE GASTROPLASTY  10/29/2016  . VARICOCELE EXCISION      MEDICATIONS: Current Outpatient Medications on File Prior to Visit  Medication Sig Dispense Refill  . acetaminophen (TYLENOL) 500 MG tablet Take 1,000 mg by mouth every 6 (six) hours as needed for mild pain.     . Albuterol Sulfate 108 (90 Base) MCG/ACT AEPB Inhale 1-2 puffs into the lungs every 6 (six) hours as needed (wheezing, sob). 1 each 2  . amLODipine (NORVASC) 5 MG tablet Take 1.5 tablets (7.5 mg total) by mouth daily. 135 tablet 1  . atorvastatin (LIPITOR) 10 MG tablet Take 1 tablet (10 mg total) by mouth daily. 90 tablet 1  . buPROPion (WELLBUTRIN XL) 300 MG 24 hr tablet Take 1 tablet (300 mg total) by mouth daily.    Marland Kitchen CALCIUM-MAGNESIUM-VITAMIN D PO Take 1 tablet by mouth 3 (three) times daily.    . cetirizine (ZYRTEC)  10 MG tablet Take 1 tablet (10 mg total) by mouth 2 (two) times daily. (Patient taking differently: Take 10 mg by mouth daily as needed for allergies. ) 60 tablet 5  . Cyanocobalamin (VITAMIN B12 PO) Take 2 tablets by mouth daily.     . fexofenadine (ALLEGRA) 180 MG tablet Take 1 tablet (180 mg total) by mouth daily as  needed for allergies or rhinitis. 30 tablet 2  . fluticasone (FLONASE) 50 MCG/ACT nasal spray Place 2 sprays into both nostrils daily as needed for allergies or rhinitis. 16 g 5  . hydrALAZINE (APRESOLINE) 50 MG tablet TAKE 1 TABLET THREE TIMES A DAY 270 tablet 1  . hydrochlorothiazide (HYDRODIURIL) 25 MG tablet Take 1 tablet (25 mg total) by mouth daily. 30 tablet 3  . losartan (COZAAR) 50 MG tablet 1 po qd 90 tablet 1  . metoprolol succinate (TOPROL XL) 100 MG 24 hr tablet Take 1 tablet (100 mg total) by mouth 2 (two) times daily. Take with or immediately following a meal. 90 tablet 1  . Multiple Vitamin (MULTI-VITAMINS) TABS Take 1 tablet by mouth daily.    Marland Kitchen omeprazole (PRILOSEC) 20 MG capsule Take 1 capsule (20 mg total) by mouth 2 (two) times daily before a meal. 180 capsule 1  . polyethylene glycol (MIRALAX / GLYCOLAX) packet Take 17 g by mouth daily as needed for mild constipation.     . potassium chloride (KLOR-CON) 10 MEQ tablet Take 3 tablets (30 mEq total) by mouth daily.    Marland Kitchen SAXENDA 18 MG/3ML SOPN     . sildenafil (VIAGRA) 100 MG tablet TAKE 1/2 TO 1 TABLET BY MOUTH DAILY AS NEEDED 15 tablet 1  . SUMAtriptan (IMITREX) 50 MG tablet May repeat in 2 hours if headache persists or recurs. 10 tablet 0  . traMADol (ULTRAM) 50 MG tablet Take 1 tablet (50 mg total) by mouth every 6 (six) hours as needed. (Patient taking differently: Take 50 mg by mouth every 6 (six) hours as needed for moderate pain. ) 20 tablet 0  . triamcinolone ointment (KENALOG) 0.1 % Apply to red itchy areas only below the face 80 g 3  . valACYclovir (VALTREX) 1000 MG tablet Take 1 tablet (1,000 mg total) by mouth daily. 30 tablet 5  . VYVANSE 20 MG capsule Take 20 mg by mouth daily.      No current facility-administered medications on file prior to visit.    ALLERGIES: Allergies  Allergen Reactions  . Oxycodone Hives and Itching  . Zoloft [Sertraline Hcl]     ?weight gain. "made me feel weird"    FAMILY  HISTORY: Family History  Problem Relation Age of Onset  . Diabetes Mother   . Hypertension Mother   . Arthritis Mother   . Cancer Mother        uterine and breast cancer, sarcoma of the brain  . Hyperlipidemia Father   . Arthritis Father   . Cancer Father        thyroid, prostate cancer  . Other Father        mass in stomach  . Hyperlipidemia Maternal Grandmother   . Hypertension Maternal Grandmother   . Hyperlipidemia Maternal Grandfather   . Hypertension Maternal Grandfather   . Hyperlipidemia Paternal Grandmother   . Hypertension Paternal Grandmother   . Hyperlipidemia Paternal Grandfather   . Hypertension Paternal Grandfather   . Heart attack Paternal Grandfather   . Sudden death Neg Hx   . Colon cancer Neg Hx   .  Esophageal cancer Neg Hx   . Stomach cancer Neg Hx   . Rectal cancer Neg Hx    ***.  SOCIAL HISTORY: Social History   Socioeconomic History  . Marital status: Divorced    Spouse name: Not on file  . Number of children: 2  . Years of education: Not on file  . Highest education level: Not on file  Occupational History  . Occupation: Astronomer: [employer]  Tobacco Use  . Smoking status: Never Smoker  . Smokeless tobacco: Never Used  Vaping Use  . Vaping Use: Never used  Substance and Sexual Activity  . Alcohol use: No    Alcohol/week: 0.0 standard drinks  . Drug use: No  . Sexual activity: Not on file  Other Topics Concern  . Not on file  Social History Narrative   Regular exercise:  No   Caffeine use:  2--32oz cups daily      Social Determinants of Health   Financial Resource Strain:   . Difficulty of Paying Living Expenses:   Food Insecurity:   . Worried About Charity fundraiser in the Last Year:   . Arboriculturist in the Last Year:   Transportation Needs:   . Film/video editor (Medical):   Marland Kitchen Lack of Transportation (Non-Medical):   Physical Activity:   . Days of Exercise per Week:   . Minutes of  Exercise per Session:   Stress:   . Feeling of Stress :   Social Connections:   . Frequency of Communication with Friends and Family:   . Frequency of Social Gatherings with Friends and Family:   . Attends Religious Services:   . Active Member of Clubs or Organizations:   . Attends Archivist Meetings:   Marland Kitchen Marital Status:   Intimate Partner Violence:   . Fear of Current or Ex-Partner:   . Emotionally Abused:   Marland Kitchen Physically Abused:   . Sexually Abused:     REVIEW OF SYSTEMS: Constitutional: No fevers, chills, or sweats, no generalized fatigue, change in appetite Eyes: No visual changes, double vision, eye pain Ear, nose and throat: No hearing loss, ear pain, nasal congestion, sore throat Cardiovascular: No chest pain, palpitations Respiratory:  No shortness of breath at rest or with exertion, wheezes GastrointestinaI: No nausea, vomiting, diarrhea, abdominal pain, fecal incontinence Genitourinary:  No dysuria, urinary retention or frequency Musculoskeletal:  No neck pain, back pain Integumentary: No rash, pruritus, skin lesions Neurological: as above Psychiatric: No depression, insomnia, anxiety Endocrine: No palpitations, fatigue, diaphoresis, mood swings, change in appetite, change in weight, increased thirst Hematologic/Lymphatic:  No purpura, petechiae. Allergic/Immunologic: no itchy/runny eyes, nasal congestion, recent allergic reactions, rashes  PHYSICAL EXAM: *** General: No acute distress.  Patient appears ***-groomed.  *** Head:  Normocephalic/atraumatic Eyes:  fundi examined but not visualized Neck: supple, no paraspinal tenderness, full range of motion Back: No paraspinal tenderness Heart: regular rate and rhythm Lungs: Clear to auscultation bilaterally. Vascular: No carotid bruits. Neurological Exam: Mental status: alert and oriented to person, place, and time, recent and remote memory intact, fund of knowledge intact, attention and concentration  intact, speech fluent and not dysarthric, language intact. Cranial nerves: CN I: not tested CN II: pupils equal, round and reactive to light, visual fields intact CN III, IV, VI:  full range of motion, no nystagmus, no ptosis CN V: facial sensation intact CN VII: upper and lower face symmetric CN VIII: hearing intact CN IX, X:  gag intact, uvula midline CN XI: sternocleidomastoid and trapezius muscles intact CN XII: tongue midline Bulk & Tone: normal, no fasciculations. Motor:  5/5 throughout *** Sensation:  Pinprick *** temperature *** and vibration sensation intact.  ***. Deep Tendon Reflexes:  2+ throughout, *** toes downgoing.  *** Finger to nose testing:  Without dysmetria.  *** Heel to shin:  Without dysmetria.  *** Gait:  Normal station and stride.  Able to turn and tandem walk. Romberg ***.  IMPRESSION: ***  PLAN: ***  Thank you for allowing me to take part in the care of this patient.  Metta Clines, DO  CC: Debbrah Alar, NP

## 2019-11-02 ENCOUNTER — Other Ambulatory Visit (INDEPENDENT_AMBULATORY_CARE_PROVIDER_SITE_OTHER)

## 2019-11-02 ENCOUNTER — Other Ambulatory Visit: Payer: Self-pay

## 2019-11-02 ENCOUNTER — Ambulatory Visit: Admitting: Neurology

## 2019-11-02 DIAGNOSIS — G4700 Insomnia, unspecified: Secondary | ICD-10-CM

## 2019-11-04 LAB — DRUG MONITORING, PANEL 8 WITH CONFIRMATION, URINE
6 Acetylmorphine: NEGATIVE ng/mL (ref <10)
Alcohol Metabolites: NEGATIVE ng/mL
Amphetamine: 1944 ng/mL — ABNORMAL HIGH (ref <250)
Amphetamines: POSITIVE ng/mL — AB (ref <500)
Benzodiazepines: NEGATIVE ng/mL (ref <100)
Buprenorphine, Urine: NEGATIVE ng/mL (ref <5)
Cocaine Metabolite: NEGATIVE ng/mL (ref <150)
Creatinine: 185.4 mg/dL
MDMA: NEGATIVE ng/mL (ref <500)
Marijuana Metabolite: NEGATIVE ng/mL (ref <20)
Methamphetamine: NEGATIVE ng/mL (ref <250)
Opiates: NEGATIVE ng/mL (ref <100)
Oxidant: NEGATIVE ug/mL
Oxycodone: NEGATIVE ng/mL (ref <100)
pH: 6.6 (ref 4.5–9.0)

## 2019-11-04 LAB — DM TEMPLATE

## 2019-12-04 ENCOUNTER — Other Ambulatory Visit: Payer: Self-pay | Admitting: Family Medicine

## 2019-12-04 DIAGNOSIS — I10 Essential (primary) hypertension: Secondary | ICD-10-CM

## 2020-01-02 NOTE — Progress Notes (Signed)
NEUROLOGY CONSULTATION NOTE  Charles Nash. MRN: [id number] DOB: 1966/01/30  Referring provider: Debbrah Alar, NP Primary care provider: Debbrah Alar, NP  Reason for consult:  headache  HISTORY OF PRESENT ILLNESS: Charles Nash is a 54 year old male with HTN, OSA, PTSD and depression who presents for headaches.  History supplemented by referring provider's note.  For the past 1 1/2 to 2 years he reports episodes of numbness and tingling over his head and face.  It involves the entire head and may involve the entire face or part of his face.  No associated headache, neck pain, visual disturbance, dizziness, or weakness.  It typically lasts from 15 to 30 minutes.  It occurs anywhere from once every 6 months to once every other month.  Sometimes, he endorses numbness and tingling in his hands and legs.  He has prior history of diabetes but is no longer on medication.  Hgb A1c from October 2020 was 6.4.  He was prescribed sumatriptan for possible migraine, which he says was ineffective.  He denies history of headaches or migraines.  CT head from 01/07/2019 personally reviewed was unremarkable.  PAST MEDICAL HISTORY: Past Medical History:  Diagnosis Date   ADHD (attention deficit hyperactivity disorder)    Allergy    Arthritis    Bilateral varicoceles    Depression    Fatty liver 08/06/2011   Genital herpes 10/25/2014   GERD (gastroesophageal reflux disease)    Hyperlipidemia    Hypertension    OSA (obstructive sleep apnea)    Personal history of colonic polyps    Plantar fasciitis    PTSD (post-traumatic stress disorder)     PAST SURGICAL HISTORY: Past Surgical History:  Procedure Laterality Date   ANKLE SURGERY     DOPPLER ECHOCARDIOGRAPHY  2010   FOOT SURGERY     KNEE SURGERY  08/04/08   Right knee-- medial meniscus repair, attenuation anterior cruciate [name] MD ([facility])    [place] 05/06/2019    Procedure: APPENDECTOMY LAPAROSCOPIC;  Surgeon: Clovis Riley, MD;  Location: [facility];  Service: General;  Laterality: N/A;   NM MYOVIEW LTD  2010   PLANTAR FASCIA RELEASE     SHOULDER SURGERY     SLEEVE GASTROPLASTY  10/29/2016   VARICOCELE EXCISION      MEDICATIONS: Current Outpatient Medications on File Prior to Visit  Medication Sig Dispense Refill   acetaminophen (TYLENOL) 500 MG tablet Take 1,000 mg by mouth every 6 (six) hours as needed for mild pain.      Albuterol Sulfate 108 (90 Base) MCG/ACT AEPB Inhale 1-2 puffs into the lungs every 6 (six) hours as needed (wheezing, sob). 1 each 2   amLODipine (NORVASC) 5 MG tablet Take 1.5 tablets (7.5 mg total) by mouth daily. 135 tablet 1   atorvastatin (LIPITOR) 10 MG tablet Take 1 tablet (10 mg total) by mouth daily. 90 tablet 1   buPROPion (WELLBUTRIN XL) 300 MG 24 hr tablet Take 1 tablet (300 mg total) by mouth daily.     CALCIUM-MAGNESIUM-VITAMIN D PO Take 1 tablet by mouth 3 (three) times daily.     cetirizine (ZYRTEC) 10 MG tablet Take 1 tablet (10 mg total) by mouth 2 (two) times daily. (Patient taking differently: Take 10 mg by mouth daily as needed for allergies. ) 60 tablet 5   Cyanocobalamin (VITAMIN B12 PO) Take 2 tablets by mouth daily.      fexofenadine (ALLEGRA) 180 MG tablet Take 1 tablet (180  mg total) by mouth daily as needed for allergies or rhinitis. 30 tablet 2   fluticasone (FLONASE) 50 MCG/ACT nasal spray Place 2 sprays into both nostrils daily as needed for allergies or rhinitis. 16 g 5   hydrALAZINE (APRESOLINE) 50 MG tablet TAKE 1 TABLET THREE TIMES A DAY 270 tablet 1   hydrochlorothiazide (HYDRODIURIL) 25 MG tablet TAKE 1 TABLET(25 MG) BY MOUTH DAILY 30 tablet 3   losartan (COZAAR) 50 MG tablet 1 po qd 90 tablet 1   metoprolol succinate (TOPROL XL) 100 MG 24 hr tablet Take 1 tablet (100 mg total) by mouth 2 (two) times daily. Take with or immediately following a meal. 90 tablet 1   Multiple  Vitamin (MULTI-VITAMINS) TABS Take 1 tablet by mouth daily.     omeprazole (PRILOSEC) 20 MG capsule Take 1 capsule (20 mg total) by mouth 2 (two) times daily before a meal. 180 capsule 1   polyethylene glycol (MIRALAX / GLYCOLAX) packet Take 17 g by mouth daily as needed for mild constipation.      potassium chloride (KLOR-CON) 10 MEQ tablet Take 3 tablets (30 mEq total) by mouth daily.     SAXENDA 18 MG/3ML SOPN      sildenafil (VIAGRA) 100 MG tablet TAKE 1/2 TO 1 TABLET BY MOUTH DAILY AS NEEDED 15 tablet 1   SUMAtriptan (IMITREX) 50 MG tablet May repeat in 2 hours if headache persists or recurs. 10 tablet 0   traMADol (ULTRAM) 50 MG tablet Take 1 tablet (50 mg total) by mouth every 6 (six) hours as needed. (Patient taking differently: Take 50 mg by mouth every 6 (six) hours as needed for moderate pain. ) 20 tablet 0   triamcinolone ointment (KENALOG) 0.1 % Apply to red itchy areas only below the face 80 g 3   valACYclovir (VALTREX) 1000 MG tablet Take 1 tablet (1,000 mg total) by mouth daily. 30 tablet 5   VYVANSE 20 MG capsule Take 20 mg by mouth daily.      No current facility-administered medications on file prior to visit.    ALLERGIES: Allergies  Allergen Reactions   Oxycodone Hives and Itching   Zoloft [Sertraline Hcl]     ?weight gain. "made me feel weird"    FAMILY HISTORY: Family History  Problem Relation Age of Onset   Diabetes Mother    Hypertension Mother    Arthritis Mother    Cancer Mother        uterine and breast cancer, sarcoma of the brain   Hyperlipidemia Father    Arthritis Father    Cancer Father        thyroid, prostate cancer   Other Father        mass in stomach   Hyperlipidemia Maternal Grandmother    Hypertension Maternal Grandmother    Hyperlipidemia Maternal Grandfather    Hypertension Maternal Grandfather    Hyperlipidemia Paternal Grandmother    Hypertension Paternal Grandmother    Hyperlipidemia Paternal  Grandfather    Hypertension Paternal Grandfather    Heart attack Paternal Grandfather    Sudden death Neg Hx    Colon cancer Neg Hx    Esophageal cancer Neg Hx    Stomach cancer Neg Hx    Rectal cancer Neg Hx    SOCIAL HISTORY: Social History   Socioeconomic History   Marital status: Divorced    Spouse name: Not on file   Number of children: 2   Years of education: Not on file   Highest education  level: Not on file  Occupational History   Occupation: FOOTBALL COACH    Employer: [employer]  Tobacco Use   Smoking status: Never Smoker   Smokeless tobacco: Never Used  Scientific laboratory technician Use: Never used  Substance and Sexual Activity   Alcohol use: No    Alcohol/week: 0.0 standard drinks   Drug use: No   Sexual activity: Not on file  Other Topics Concern   Not on file  Social History Narrative   Regular exercise:  No   Caffeine use:  2--32oz cups daily      Social Determinants of Health   Financial Resource Strain:    Difficulty of Paying Living Expenses: Not on file  Food Insecurity:    Worried About Charity fundraiser in the Last Year: Not on file   [employer] of Food in the Last Year: Not on file  Transportation Needs:    Lack of Transportation (Medical): Not on file   Lack of Transportation (Non-Medical): Not on file  Physical Activity:    Days of Exercise per Week: Not on file   Minutes of Exercise per Session: Not on file  Stress:    Feeling of Stress : Not on file  Social Connections:    Frequency of Communication with Friends and Family: Not on file   Frequency of Social Gatherings with Friends and Family: Not on file   Attends Religious Services: Not on file   Active Member of Clubs or Organizations: Not on file   Attends Archivist Meetings: Not on file   Marital Status: Not on file  Intimate Partner Violence:    Fear of Current or Ex-Partner: Not on file   Emotionally Abused: Not on file    Physically Abused: Not on file   Sexually Abused: Not on file    PHYSICAL EXAM: Blood pressure (!) 152/89, pulse 80, height 6\' 1"  (1.854 m), weight 294 lb 6.4 oz (133.5 kg), SpO2 97 %. General: No acute distress.  Patient appears well-groomed.   Head:  Normocephalic/atraumatic Eyes:  fundi examined but not visualized Neck: supple, no paraspinal tenderness, full range of motion Back: No paraspinal tenderness Heart: regular rate and rhythm Lungs: Clear to auscultation bilaterally. Vascular: No carotid bruits. Neurological Exam: Mental status: alert and oriented to person, place, and time, recent and remote memory intact, fund of knowledge intact, attention and concentration intact, speech fluent and not dysarthric, language intact. Cranial nerves: CN I: not tested CN II: pupils equal, round and reactive to light, visual fields intact CN III, IV, VI:  full range of motion, no nystagmus, no ptosis CN V: facial sensation intact CN VII: upper and lower face symmetric CN VIII: hearing intact CN IX, X: gag intact, uvula midline CN XI: sternocleidomastoid and trapezius muscles intact CN XII: tongue midline Bulk & Tone: normal, no fasciculations. Motor:  5/5 throughout Sensation:  Pinprick and vibration sensation intact. Deep Tendon Reflexes:  2+ throughout, toes downgoing.  Finger to nose testing:  Without dysmetria.   Heel to shin:  Without dysmetria.   Gait:  Normal station and stride.  Romberg negative.  IMPRESSION: 1.  Transient recurrent facial and scalp paresthesias.  Unclear etiology.  I don't think these are migraines. 2.  Numbness and tingling in extremities.   PLAN: 1.  MRI of brain with and without contrast 2.  B12 and TSH 3.  Further recommendations pending results.  Thank you for allowing me to take part in the care  of this patient.  Metta Clines, DO  CC:  Debbrah Alar, NP

## 2020-01-04 ENCOUNTER — Ambulatory Visit (INDEPENDENT_AMBULATORY_CARE_PROVIDER_SITE_OTHER): Admitting: Neurology

## 2020-01-04 ENCOUNTER — Other Ambulatory Visit: Payer: Self-pay

## 2020-01-04 ENCOUNTER — Encounter: Payer: Self-pay | Admitting: Neurology

## 2020-01-04 ENCOUNTER — Other Ambulatory Visit (INDEPENDENT_AMBULATORY_CARE_PROVIDER_SITE_OTHER)

## 2020-01-04 VITALS — BP 152/89 | HR 80 | Ht 73.0 in | Wt 294.4 lb

## 2020-01-04 DIAGNOSIS — R202 Paresthesia of skin: Secondary | ICD-10-CM | POA: Diagnosis not present

## 2020-01-04 DIAGNOSIS — M792 Neuralgia and neuritis, unspecified: Secondary | ICD-10-CM

## 2020-01-04 DIAGNOSIS — R200 Anesthesia of skin: Secondary | ICD-10-CM | POA: Diagnosis not present

## 2020-01-04 LAB — VITAMIN B12: Vitamin B-12: 1089 pg/mL — ABNORMAL HIGH (ref 211–911)

## 2020-01-04 LAB — TSH: TSH: 0.61 u[IU]/mL (ref 0.35–4.50)

## 2020-01-04 NOTE — Patient Instructions (Signed)
1.  Check MRI brain 2.  Check B12 and TSH

## 2020-01-09 ENCOUNTER — Encounter: Payer: Self-pay | Admitting: Family

## 2020-01-09 MED ORDER — ZOLPIDEM TARTRATE 5 MG PO TABS: 5.0000 mg | ORAL_TABLET | Freq: Every evening | ORAL | 0 refills | Status: DC | PRN

## 2020-01-16 NOTE — Telephone Encounter (Signed)
Med list will be faxed to [facility] in [place], please advised if immunizations needed

## 2020-01-19 ENCOUNTER — Other Ambulatory Visit: Payer: Self-pay

## 2020-01-19 ENCOUNTER — Telehealth: Payer: Self-pay

## 2020-01-19 DIAGNOSIS — R200 Anesthesia of skin: Secondary | ICD-10-CM

## 2020-01-19 DIAGNOSIS — R202 Paresthesia of skin: Secondary | ICD-10-CM

## 2020-01-19 DIAGNOSIS — M792 Neuralgia and neuritis, unspecified: Secondary | ICD-10-CM

## 2020-01-19 NOTE — Telephone Encounter (Signed)
Patient scheduled to be here 01-20-20 for shigles 1 of 2 and a flu shot.

## 2020-01-19 NOTE — Progress Notes (Signed)
Per pt he will prefer an open MRI at [place].

## 2020-01-20 ENCOUNTER — Ambulatory Visit (INDEPENDENT_AMBULATORY_CARE_PROVIDER_SITE_OTHER)

## 2020-01-20 ENCOUNTER — Encounter: Payer: Self-pay | Admitting: Family

## 2020-01-20 ENCOUNTER — Other Ambulatory Visit: Payer: Self-pay

## 2020-01-20 DIAGNOSIS — Z23 Encounter for immunization: Secondary | ICD-10-CM

## 2020-01-20 NOTE — Progress Notes (Signed)
Pre visit review using our clinic review tool, if applicable. No additional management support is needed unless otherwise documented below in the visit note.  Patient here for flu vaccine. 0.75mL flu vaccine given in right deltoid IM. Patient tolerated well. VIS given.   Patient also given shignrix vaccine in left deltoid IM. Patient tolerated well.

## 2020-01-23 ENCOUNTER — Encounter: Payer: Self-pay | Admitting: Family

## 2020-01-23 DIAGNOSIS — I10 Essential (primary) hypertension: Secondary | ICD-10-CM

## 2020-01-24 MED ORDER — LOSARTAN POTASSIUM 50 MG PO TABS: ORAL_TABLET | ORAL | 1 refills | Status: DC

## 2020-01-24 MED ORDER — LOSARTAN POTASSIUM 50 MG PO TABS: ORAL_TABLET | ORAL | 0 refills | Status: DC

## 2020-01-24 NOTE — Addendum Note (Signed)
Addended by: Kem Boroughs D on: 01/24/2020 04:47 PM   Modules accepted: Orders

## 2020-01-27 ENCOUNTER — Encounter: Payer: Self-pay | Admitting: Family

## 2020-01-27 ENCOUNTER — Telehealth (INDEPENDENT_AMBULATORY_CARE_PROVIDER_SITE_OTHER): Admitting: Family

## 2020-01-27 ENCOUNTER — Other Ambulatory Visit: Payer: Self-pay

## 2020-01-27 DIAGNOSIS — T508X5A Adverse effect of diagnostic agents, initial encounter: Secondary | ICD-10-CM | POA: Diagnosis not present

## 2020-01-27 NOTE — Progress Notes (Signed)
Virtual Visit via Video Note  I connected with Charles Nash. on 01/27/20 at  4:20 PM EDT by a video enabled telemedicine application and verified that I am speaking with the correct person using two identifiers.  Location: Patient: parked car Provider: office   I discussed the limitations of evaluation and management by telemedicine and the availability of in person appointments. The patient expressed understanding and agreed to proceed. Only the patient and myself were present for today's video call.   History of Present Illness:  Patient reports that he had an MRI of the brain w/wo yesterday. Following the administration of contrast he developed sensation of heat and burning in his back. This is worse when he gets hot.   Reports pain "right below the shoulder blades."  Goes across both sides of his back.  Reports that it will itch and burn.   He took some benadryl last night. "all it did was put me to sleep." Denies tongue/lip swelling or SOB.   Moving to [facility] for work in November. He is looking forward to this move.   Observations/Objective:   Gen: Awake, alert, no acute distress Resp: Breathing is even and non-labored Psych: calm/pleasant demeanor Neuro: Alert and Oriented x 3, + facial symmetry, speech is clear. Skin:  No obvious rash or lesions noted on back  Assessment and Plan:  Contrast reaction- we discussed trial of prednisone.  He prefers to hold off on this and try an antihistamine instead. Recommended trial of zyrtec. He is advised to call if he develops rash or if symptoms worsen/fail to improve.    Follow Up Instructions:    I discussed the assessment and treatment plan with the patient. The patient was provided an opportunity to ask questions and all were answered. The patient agreed with the plan and demonstrated an understanding of the instructions.   The patient was advised to call back or seek an in-person evaluation if the symptoms worsen or if  the condition fails to improve as anticipated.  Nance Pear, NP

## 2020-01-28 ENCOUNTER — Other Ambulatory Visit

## 2020-01-30 ENCOUNTER — Telehealth: Payer: Self-pay | Admitting: Neurology

## 2020-01-30 NOTE — Telephone Encounter (Signed)
MRI of brain with and without contrast was performed.  It shows nonspecific white matter changes which may be seen in people with history of high blood pressure and high cholesterol.  There is nothing concerning that would be causing the head/face pressure/tingling sensation.  I have no further recommendations

## 2020-01-30 NOTE — Telephone Encounter (Signed)
Pt advised.

## 2020-02-03 ENCOUNTER — Other Ambulatory Visit (HOSPITAL_BASED_OUTPATIENT_CLINIC_OR_DEPARTMENT_OTHER): Payer: Self-pay | Admitting: Internal Medicine

## 2020-02-03 ENCOUNTER — Ambulatory Visit: Attending: Internal Medicine

## 2020-02-03 DIAGNOSIS — Z23 Encounter for immunization: Secondary | ICD-10-CM

## 2020-02-09 MED FILL — PFIZER-BIONTECH COVID-19 VA: 30 | 1 days supply | Qty: 0 | Fill #0

## 2020-02-21 ENCOUNTER — Other Ambulatory Visit: Payer: Self-pay | Admitting: Family

## 2020-02-21 DIAGNOSIS — I10 Essential (primary) hypertension: Secondary | ICD-10-CM

## 2020-02-24 ENCOUNTER — Other Ambulatory Visit: Payer: Self-pay | Admitting: Family

## 2020-02-24 NOTE — Telephone Encounter (Signed)
Last written: 01/09/20 Last ov: 01/27/20 Next ov: NONE Contract:  UDS: 11/02/19

## 2020-02-29 ENCOUNTER — Other Ambulatory Visit: Payer: Self-pay | Admitting: Family

## 2020-02-29 DIAGNOSIS — I10 Essential (primary) hypertension: Secondary | ICD-10-CM

## 2020-04-26 ENCOUNTER — Other Ambulatory Visit: Payer: Self-pay | Admitting: Family Medicine

## 2020-04-26 DIAGNOSIS — I10 Essential (primary) hypertension: Secondary | ICD-10-CM

## 2020-05-04 ENCOUNTER — Encounter: Payer: Self-pay | Admitting: Family

## 2020-05-04 MED ORDER — AMLODIPINE BESYLATE 10 MG PO TABS: 10.0000 mg | ORAL_TABLET | Freq: Every day | ORAL | 0 refills | Status: DC

## 2020-05-27 ENCOUNTER — Other Ambulatory Visit: Payer: Self-pay | Admitting: Family

## 2020-05-27 DIAGNOSIS — I10 Essential (primary) hypertension: Secondary | ICD-10-CM

## 2020-08-01 ENCOUNTER — Other Ambulatory Visit: Payer: Self-pay | Admitting: Family

## 2020-10-19 ENCOUNTER — Other Ambulatory Visit: Payer: Self-pay | Admitting: Family

## 2020-11-05 ENCOUNTER — Encounter: Payer: Self-pay | Admitting: Family

## 2020-11-06 MED ORDER — ZOLPIDEM TARTRATE 5 MG PO TABS: ORAL_TABLET | ORAL | 0 refills | Status: AC

## 2020-11-06 MED ORDER — SUMATRIPTAN SUCCINATE 50 MG PO TABS: ORAL_TABLET | ORAL | 0 refills | Status: AC

## 2020-11-06 NOTE — Telephone Encounter (Signed)
Requesting: Ambien '5mg'$  Contract: 10/25/2019 UDS: 11/02/2019 Last Visit: 01/27/2020 Next Visit: 11/14/2020 Last Refill: 02/24/2020 #30 and 0RF  Please Advise

## 2020-11-14 ENCOUNTER — Telehealth: Payer: TRICARE For Life (TFL) | Admitting: Family

## 2020-11-14 ENCOUNTER — Other Ambulatory Visit: Payer: Self-pay

## 2020-11-14 ENCOUNTER — Telehealth: Payer: Self-pay | Admitting: Family

## 2020-11-14 MED ORDER — VALSARTAN 320 MG PO TABS
320.0000 mg | ORAL_TABLET | Freq: Every day | ORAL | 0 refills | Status: DC
Start: 2020-11-14 — End: 2020-12-10

## 2020-11-14 NOTE — Progress Notes (Signed)
See phone note

## 2020-11-14 NOTE — Telephone Encounter (Signed)
Pt was scheduled for video visit but unfortunately, [place] is in [place] and advised him we cannot [place]. He does report recent elevated blood pressures on below regimen.     On losartan '100mg'$  Amlodipine '10mg'$  Hydralazine 50 bid Hctz '25mg'$  Metoprolol xl '100mg'$   bid  Advised pt to d/c losartan, begin valsartan '320mg'$ . I advised him to obtain follow up bmet at the [place] in 2 weeks and send me his results. He will be back in town in 1 month for a face to face office visit and bp check.  Pt verbalizes understanding.

## 2020-12-09 ENCOUNTER — Other Ambulatory Visit: Payer: Self-pay | Admitting: Family

## 2020-12-13 ENCOUNTER — Other Ambulatory Visit: Payer: Self-pay | Admitting: Family

## 2020-12-19 ENCOUNTER — Ambulatory Visit: Payer: TRICARE For Life (TFL) | Admitting: Family

## 2020-12-21 ENCOUNTER — Telehealth: Payer: TRICARE For Life (TFL) | Admitting: Family

## 2020-12-21 ENCOUNTER — Other Ambulatory Visit: Payer: Self-pay

## 2020-12-22 NOTE — Progress Notes (Signed)
Visit cancelled by patient

## 2020-12-31 ENCOUNTER — Ambulatory Visit: Payer: TRICARE For Life (TFL) | Admitting: Family

## 2021-03-16 ENCOUNTER — Other Ambulatory Visit: Payer: Self-pay | Admitting: Family

## 2021-03-17 ENCOUNTER — Other Ambulatory Visit: Payer: Self-pay | Admitting: Family

## 2021-04-17 ENCOUNTER — Other Ambulatory Visit: Payer: Self-pay | Admitting: Family
